# Patient Record
Sex: Male | Born: 1960 | Race: White | Hispanic: No | Marital: Married | Smoking: Former smoker
Health system: Southern US, Community
[De-identification: ages and names within clinical notes are randomized; demographics above are authoritative.]

## PROBLEM LIST (undated history)

## (undated) DIAGNOSIS — J45909 Unspecified asthma, uncomplicated: Secondary | ICD-10-CM

## (undated) DIAGNOSIS — Z8489 Family history of other specified conditions: Secondary | ICD-10-CM

## (undated) DIAGNOSIS — J449 Chronic obstructive pulmonary disease, unspecified: Secondary | ICD-10-CM

## (undated) DIAGNOSIS — J189 Pneumonia, unspecified organism: Secondary | ICD-10-CM

## (undated) DIAGNOSIS — G4733 Obstructive sleep apnea (adult) (pediatric): Secondary | ICD-10-CM

## (undated) DIAGNOSIS — R410 Disorientation, unspecified: Secondary | ICD-10-CM

## (undated) DIAGNOSIS — W19XXXA Unspecified fall, initial encounter: Secondary | ICD-10-CM

## (undated) DIAGNOSIS — G8929 Other chronic pain: Secondary | ICD-10-CM

## (undated) DIAGNOSIS — I509 Heart failure, unspecified: Secondary | ICD-10-CM

## (undated) DIAGNOSIS — K259 Gastric ulcer, unspecified as acute or chronic, without hemorrhage or perforation: Secondary | ICD-10-CM

## (undated) DIAGNOSIS — R112 Nausea with vomiting, unspecified: Secondary | ICD-10-CM

## (undated) DIAGNOSIS — E119 Type 2 diabetes mellitus without complications: Secondary | ICD-10-CM

## (undated) DIAGNOSIS — K219 Gastro-esophageal reflux disease without esophagitis: Secondary | ICD-10-CM

## (undated) DIAGNOSIS — J42 Unspecified chronic bronchitis: Secondary | ICD-10-CM

## (undated) DIAGNOSIS — K859 Acute pancreatitis without necrosis or infection, unspecified: Secondary | ICD-10-CM

## (undated) DIAGNOSIS — N200 Calculus of kidney: Secondary | ICD-10-CM

## (undated) DIAGNOSIS — I10 Essential (primary) hypertension: Secondary | ICD-10-CM

## (undated) DIAGNOSIS — F32A Depression, unspecified: Secondary | ICD-10-CM

## (undated) DIAGNOSIS — A770 Spotted fever due to Rickettsia rickettsii: Secondary | ICD-10-CM

## (undated) DIAGNOSIS — R51 Headache: Secondary | ICD-10-CM

## (undated) DIAGNOSIS — M797 Fibromyalgia: Secondary | ICD-10-CM

## (undated) DIAGNOSIS — J302 Other seasonal allergic rhinitis: Secondary | ICD-10-CM

## (undated) DIAGNOSIS — M1990 Unspecified osteoarthritis, unspecified site: Secondary | ICD-10-CM

## (undated) DIAGNOSIS — E785 Hyperlipidemia, unspecified: Secondary | ICD-10-CM

## (undated) DIAGNOSIS — I639 Cerebral infarction, unspecified: Secondary | ICD-10-CM

## (undated) DIAGNOSIS — K7460 Unspecified cirrhosis of liver: Secondary | ICD-10-CM

## (undated) DIAGNOSIS — F419 Anxiety disorder, unspecified: Secondary | ICD-10-CM

## (undated) DIAGNOSIS — R350 Frequency of micturition: Secondary | ICD-10-CM

## (undated) DIAGNOSIS — F329 Major depressive disorder, single episode, unspecified: Secondary | ICD-10-CM

## (undated) DIAGNOSIS — Y92009 Unspecified place in unspecified non-institutional (private) residence as the place of occurrence of the external cause: Secondary | ICD-10-CM

## (undated) DIAGNOSIS — F0390 Unspecified dementia without behavioral disturbance: Secondary | ICD-10-CM

## (undated) DIAGNOSIS — IMO0001 Reserved for inherently not codable concepts without codable children: Secondary | ICD-10-CM

## (undated) DIAGNOSIS — M549 Dorsalgia, unspecified: Secondary | ICD-10-CM

## (undated) DIAGNOSIS — I219 Acute myocardial infarction, unspecified: Secondary | ICD-10-CM

## (undated) HISTORY — PX: OTHER SURGICAL HISTORY: SHX169

## (undated) HISTORY — DX: Unspecified asthma, uncomplicated: J45.909

## (undated) HISTORY — PX: TONSILLECTOMY: SUR1361

## (undated) HISTORY — PX: CYSTOSCOPY W/ STONE MANIPULATION: SHX1427

## (undated) HISTORY — DX: Unspecified dementia, unspecified severity, without behavioral disturbance, psychotic disturbance, mood disturbance, and anxiety: F03.90

## (undated) HISTORY — PX: CHOLECYSTECTOMY: SHX55

## (undated) HISTORY — DX: Spotted fever due to Rickettsia rickettsii: A77.0

## (undated) HISTORY — PX: CARDIAC CATHETERIZATION: SHX172

## (undated) HISTORY — DX: Other seasonal allergic rhinitis: J30.2

## (undated) HISTORY — DX: Chronic obstructive pulmonary disease, unspecified: J44.9

## (undated) HISTORY — DX: Anxiety disorder, unspecified: F41.9

## (undated) HISTORY — DX: Hyperlipidemia, unspecified: E78.5

## (undated) HISTORY — PX: UPPER GASTROINTESTINAL ENDOSCOPY: SHX188

## (undated) HISTORY — PX: COLONOSCOPY: SHX174

---

## 2008-06-15 ENCOUNTER — Ambulatory Visit: Payer: Self-pay | Admitting: Diagnostic Radiology

## 2008-06-15 ENCOUNTER — Emergency Department (HOSPITAL_BASED_OUTPATIENT_CLINIC_OR_DEPARTMENT_OTHER): Admission: EM | Admit: 2008-06-15 | Discharge: 2008-06-15 | Payer: Self-pay | Admitting: Emergency Medicine

## 2008-08-15 ENCOUNTER — Emergency Department (HOSPITAL_BASED_OUTPATIENT_CLINIC_OR_DEPARTMENT_OTHER): Admission: EM | Admit: 2008-08-15 | Discharge: 2008-08-16 | Payer: Self-pay | Admitting: Emergency Medicine

## 2008-08-15 ENCOUNTER — Ambulatory Visit: Payer: Self-pay | Admitting: Diagnostic Radiology

## 2008-11-05 ENCOUNTER — Ambulatory Visit: Payer: Self-pay | Admitting: Diagnostic Radiology

## 2008-11-05 ENCOUNTER — Emergency Department (HOSPITAL_BASED_OUTPATIENT_CLINIC_OR_DEPARTMENT_OTHER): Admission: EM | Admit: 2008-11-05 | Discharge: 2008-11-06 | Payer: Self-pay | Admitting: Emergency Medicine

## 2010-05-14 ENCOUNTER — Emergency Department (HOSPITAL_BASED_OUTPATIENT_CLINIC_OR_DEPARTMENT_OTHER)
Admission: EM | Admit: 2010-05-14 | Discharge: 2010-05-14 | Payer: Self-pay | Source: Home / Self Care | Admitting: Emergency Medicine

## 2010-06-08 ENCOUNTER — Emergency Department (HOSPITAL_BASED_OUTPATIENT_CLINIC_OR_DEPARTMENT_OTHER)
Admission: EM | Admit: 2010-06-08 | Discharge: 2010-06-08 | Payer: Self-pay | Source: Home / Self Care | Admitting: Emergency Medicine

## 2010-06-09 LAB — DIFFERENTIAL
Basophils Absolute: 0 10*3/uL (ref 0.0–0.1)
Basophils Relative: 1 % (ref 0–1)
Eosinophils Absolute: 0.2 10*3/uL (ref 0.0–0.7)
Eosinophils Relative: 2 % (ref 0–5)
Lymphocytes Relative: 32 % (ref 12–46)
Lymphs Abs: 2.4 10*3/uL (ref 0.7–4.0)
Monocytes Absolute: 0.6 10*3/uL (ref 0.1–1.0)
Monocytes Relative: 8 % (ref 3–12)
Neutro Abs: 4.3 10*3/uL (ref 1.7–7.7)
Neutrophils Relative %: 57 % (ref 43–77)

## 2010-06-09 LAB — URINALYSIS, ROUTINE W REFLEX MICROSCOPIC
Hgb urine dipstick: NEGATIVE
Ketones, ur: 15 mg/dL — AB
Nitrite: NEGATIVE
Protein, ur: NEGATIVE mg/dL
Specific Gravity, Urine: 1.024 (ref 1.005–1.030)
Urine Glucose, Fasting: NEGATIVE mg/dL
Urobilinogen, UA: 1 mg/dL (ref 0.0–1.0)
pH: 6 (ref 5.0–8.0)

## 2010-06-09 LAB — COMPREHENSIVE METABOLIC PANEL
ALT: 42 U/L (ref 0–53)
AST: 38 U/L — ABNORMAL HIGH (ref 0–37)
Albumin: 4.4 g/dL (ref 3.5–5.2)
Alkaline Phosphatase: 67 U/L (ref 39–117)
BUN: 12 mg/dL (ref 6–23)
CO2: 22 mEq/L (ref 19–32)
Calcium: 9.1 mg/dL (ref 8.4–10.5)
Chloride: 106 mEq/L (ref 96–112)
Creatinine, Ser: 1 mg/dL (ref 0.4–1.5)
GFR calc Af Amer: >60 mL/min (ref >60)
GFR calc non Af Amer: >60 mL/min (ref >60)
Glucose, Bld: 112 mg/dL — ABNORMAL HIGH (ref 70–99)
Potassium: 4.1 mEq/L (ref 3.5–5.1)
Sodium: 144 mEq/L (ref 135–145)
Total Bilirubin: 0.6 mg/dL (ref 0.3–1.2)
Total Protein: 7.4 g/dL (ref 6.0–8.3)

## 2010-06-09 LAB — CBC
HCT: 41.6 % (ref 39.0–52.0)
Hemoglobin: 14.4 g/dL (ref 13.0–17.0)
MCH: 28.9 pg (ref 26.0–34.0)
MCHC: 34.6 g/dL (ref 30.0–36.0)
MCV: 83.4 fL (ref 78.0–100.0)
Platelets: 230 10*3/uL (ref 150–400)
RBC: 4.99 MIL/uL (ref 4.22–5.81)
RDW: 14.1 % (ref 11.5–15.5)
WBC: 7.5 10*3/uL (ref 4.0–10.5)

## 2010-06-09 LAB — LIPASE, BLOOD: Lipase: 67 U/L (ref 23–300)

## 2010-06-13 ENCOUNTER — Encounter: Payer: Self-pay | Admitting: Specialist

## 2010-07-29 ENCOUNTER — Other Ambulatory Visit: Payer: Self-pay | Admitting: Specialist

## 2010-07-29 DIAGNOSIS — R1011 Right upper quadrant pain: Secondary | ICD-10-CM

## 2010-08-02 LAB — DIFFERENTIAL
Basophils Absolute: 0.1 K/uL (ref 0.0–0.1)
Basophils Relative: 1 % (ref 0–1)
Eosinophils Absolute: 0.4 K/uL (ref 0.0–0.7)
Eosinophils Relative: 6 % — ABNORMAL HIGH (ref 0–5)
Lymphocytes Relative: 27 % (ref 12–46)
Lymphs Abs: 1.6 K/uL (ref 0.7–4.0)
Monocytes Absolute: 0.5 K/uL (ref 0.1–1.0)
Monocytes Relative: 9 % (ref 3–12)
Neutro Abs: 3.3 K/uL (ref 1.7–7.7)
Neutrophils Relative %: 57 % (ref 43–77)

## 2010-08-02 LAB — BASIC METABOLIC PANEL
BUN: 12 mg/dL (ref 6–23)
CO2: 24 mEq/L (ref 19–32)
Calcium: 9.6 mg/dL (ref 8.4–10.5)
Chloride: 105 mEq/L (ref 96–112)
Creatinine, Ser: 0.7 mg/dL (ref 0.4–1.5)
GFR calc Af Amer: >60 mL/min (ref >60)
GFR calc non Af Amer: >60 mL/min (ref >60)
Glucose, Bld: 130 mg/dL — ABNORMAL HIGH (ref 70–99)
Potassium: 4.4 mEq/L (ref 3.5–5.1)
Sodium: 140 mEq/L (ref 135–145)

## 2010-08-02 LAB — CBC
HCT: 38.6 % — ABNORMAL LOW (ref 39.0–52.0)
Hemoglobin: 13.2 g/dL (ref 13.0–17.0)
MCH: 28.7 pg (ref 26.0–34.0)
MCHC: 34.2 g/dL (ref 30.0–36.0)
MCV: 83.9 fL (ref 78.0–100.0)
Platelets: 168 10*3/uL (ref 150–400)
RBC: 4.6 MIL/uL (ref 4.22–5.81)
RDW: 14 % (ref 11.5–15.5)
WBC: 5.9 10*3/uL (ref 4.0–10.5)

## 2010-08-02 LAB — D-DIMER, QUANTITATIVE: D-Dimer, Quant: <0.22 ug/mL-FEU (ref 0.00–0.48)

## 2010-08-02 LAB — POCT B-TYPE NATRIURETIC PEPTIDE (BNP): B Natriuretic Peptide, POC: <5 pg/mL (ref 0–100)

## 2010-08-03 ENCOUNTER — Ambulatory Visit
Admission: RE | Admit: 2010-08-03 | Discharge: 2010-08-03 | Disposition: A | Discharge status: Home / Self Care | Payer: BC Managed Care – HMO | Source: Ambulatory Visit | Attending: Specialist | Admitting: Specialist

## 2010-08-03 DIAGNOSIS — R1011 Right upper quadrant pain: Secondary | ICD-10-CM

## 2010-09-02 LAB — APTT: aPTT: 24 seconds (ref 24–37)

## 2010-09-02 LAB — POCT CARDIAC MARKERS
Myoglobin, poc: 37 ng/mL (ref 12–200)
Troponin i, poc: <0.05 ng/mL (ref 0.00–0.09)

## 2010-09-02 LAB — D-DIMER, QUANTITATIVE: D-Dimer, Quant: <0.22 ug/mL-FEU (ref 0.00–0.48)

## 2010-09-02 LAB — COMPREHENSIVE METABOLIC PANEL
ALT: 38 U/L (ref 0–53)
AST: 37 U/L (ref 0–37)
Albumin: 4.3 g/dL (ref 3.5–5.2)
Alkaline Phosphatase: 55 U/L (ref 39–117)
BUN: 13 mg/dL (ref 6–23)
CO2: 24 mEq/L (ref 19–32)
Calcium: 9.1 mg/dL (ref 8.4–10.5)
Chloride: 102 mEq/L (ref 96–112)
Creatinine, Ser: 0.7 mg/dL (ref 0.4–1.5)
GFR calc Af Amer: >60 mL/min (ref >60)
GFR calc non Af Amer: >60 mL/min (ref >60)
Glucose, Bld: 221 mg/dL — ABNORMAL HIGH (ref 70–99)
Potassium: 3.6 mEq/L (ref 3.5–5.1)
Sodium: 137 mEq/L (ref 135–145)
Total Bilirubin: 0.6 mg/dL (ref 0.3–1.2)
Total Protein: 7.2 g/dL (ref 6.0–8.3)

## 2010-09-02 LAB — DIFFERENTIAL
Basophils Absolute: 0 10*3/uL (ref 0.0–0.1)
Basophils Relative: 1 % (ref 0–1)
Eosinophils Absolute: 0.1 10*3/uL (ref 0.0–0.7)
Eosinophils Relative: 1 % (ref 0–5)
Lymphocytes Relative: 22 % (ref 12–46)
Lymphs Abs: 1.4 10*3/uL (ref 0.7–4.0)
Monocytes Absolute: 0.5 10*3/uL (ref 0.1–1.0)
Monocytes Relative: 7 % (ref 3–12)
Neutro Abs: 4.4 10*3/uL (ref 1.7–7.7)
Neutrophils Relative %: 69 % (ref 43–77)

## 2010-09-02 LAB — POCT B-TYPE NATRIURETIC PEPTIDE (BNP): B Natriuretic Peptide, POC: 5.8 pg/mL (ref 0–100)

## 2010-09-02 LAB — HEPATIC FUNCTION PANEL
Alkaline Phosphatase: 53 U/L (ref 39–117)
Bilirubin, Direct: 0 mg/dL (ref 0.0–0.3)
Indirect Bilirubin: 0.6 mg/dL (ref 0.3–0.9)
Total Protein: 7.1 g/dL (ref 6.0–8.3)

## 2010-09-02 LAB — PROTIME-INR
INR: 1 (ref 0.00–1.49)
Prothrombin Time: 13.2 seconds (ref 11.6–15.2)

## 2010-09-02 LAB — CULTURE, BLOOD (ROUTINE X 2)
Culture: NO GROWTH
Culture: NO GROWTH

## 2010-09-02 LAB — CBC
HCT: 41.4 % (ref 39.0–52.0)
Hemoglobin: 13.9 g/dL (ref 13.0–17.0)
MCHC: 33.7 g/dL (ref 30.0–36.0)
MCV: 87.9 fL (ref 78.0–100.0)
Platelets: 222 10*3/uL (ref 150–400)
RBC: 4.71 MIL/uL (ref 4.22–5.81)
RDW: 12.9 % (ref 11.5–15.5)
WBC: 6.4 10*3/uL (ref 4.0–10.5)

## 2010-09-02 LAB — LIPASE, BLOOD: Lipase: 54 U/L (ref 23–300)

## 2010-09-06 LAB — URINALYSIS, ROUTINE W REFLEX MICROSCOPIC
Hgb urine dipstick: NEGATIVE
Nitrite: NEGATIVE
Protein, ur: NEGATIVE mg/dL
Specific Gravity, Urine: 1.027 (ref 1.005–1.030)
Urobilinogen, UA: 0.2 mg/dL (ref 0.0–1.0)

## 2010-09-06 LAB — COMPREHENSIVE METABOLIC PANEL
ALT: 39 U/L (ref 0–53)
AST: 48 U/L — ABNORMAL HIGH (ref 0–37)
Albumin: 4.3 g/dL (ref 3.5–5.2)
Alkaline Phosphatase: 56 U/L (ref 39–117)
CO2: 26 mEq/L (ref 19–32)
Chloride: 105 mEq/L (ref 96–112)
GFR calc Af Amer: >60 mL/min (ref >60)
GFR calc non Af Amer: >60 mL/min (ref >60)
Potassium: 3.9 mEq/L (ref 3.5–5.1)
Sodium: 139 mEq/L (ref 135–145)
Total Bilirubin: 0.4 mg/dL (ref 0.3–1.2)

## 2010-09-06 LAB — CBC
MCV: 88.4 fL (ref 78.0–100.0)
Platelets: 232 10*3/uL (ref 150–400)
RBC: 4.77 MIL/uL (ref 4.22–5.81)
WBC: 6.2 10*3/uL (ref 4.0–10.5)

## 2010-09-06 LAB — DIFFERENTIAL
Basophils Absolute: 0 10*3/uL (ref 0.0–0.1)
Eosinophils Absolute: 0.1 10*3/uL (ref 0.0–0.7)
Eosinophils Relative: 2 % (ref 0–5)
Lymphocytes Relative: 41 % (ref 12–46)
Monocytes Absolute: 0.4 10*3/uL (ref 0.1–1.0)

## 2010-09-06 LAB — LIPASE, BLOOD: Lipase: 85 U/L (ref 23–300)

## 2010-10-17 ENCOUNTER — Emergency Department (HOSPITAL_BASED_OUTPATIENT_CLINIC_OR_DEPARTMENT_OTHER)
Admission: EM | Admit: 2010-10-17 | Discharge: 2010-10-17 | Disposition: A | Discharge status: Home / Self Care | Payer: Self-pay | Attending: Emergency Medicine | Admitting: Emergency Medicine

## 2010-10-17 ENCOUNTER — Emergency Department (INDEPENDENT_AMBULATORY_CARE_PROVIDER_SITE_OTHER): Payer: Self-pay

## 2010-10-17 DIAGNOSIS — Y92009 Unspecified place in unspecified non-institutional (private) residence as the place of occurrence of the external cause: Secondary | ICD-10-CM | POA: Insufficient documentation

## 2010-10-17 DIAGNOSIS — S82899A Other fracture of unspecified lower leg, initial encounter for closed fracture: Secondary | ICD-10-CM

## 2010-10-17 DIAGNOSIS — W098XXA Fall on or from other playground equipment, initial encounter: Secondary | ICD-10-CM

## 2010-10-17 DIAGNOSIS — W07XXXA Fall from chair, initial encounter: Secondary | ICD-10-CM | POA: Insufficient documentation

## 2011-04-21 ENCOUNTER — Emergency Department (HOSPITAL_BASED_OUTPATIENT_CLINIC_OR_DEPARTMENT_OTHER)
Admission: EM | Admit: 2011-04-21 | Discharge: 2011-04-22 | Disposition: A | Discharge status: Home / Self Care | Payer: Self-pay | Attending: Emergency Medicine | Admitting: Emergency Medicine

## 2011-04-21 DIAGNOSIS — I509 Heart failure, unspecified: Secondary | ICD-10-CM | POA: Insufficient documentation

## 2011-04-21 DIAGNOSIS — Z79899 Other long term (current) drug therapy: Secondary | ICD-10-CM | POA: Insufficient documentation

## 2011-04-21 DIAGNOSIS — R1031 Right lower quadrant pain: Secondary | ICD-10-CM | POA: Insufficient documentation

## 2011-04-21 DIAGNOSIS — M549 Dorsalgia, unspecified: Secondary | ICD-10-CM | POA: Insufficient documentation

## 2011-04-21 DIAGNOSIS — G4730 Sleep apnea, unspecified: Secondary | ICD-10-CM | POA: Insufficient documentation

## 2011-04-21 DIAGNOSIS — I10 Essential (primary) hypertension: Secondary | ICD-10-CM | POA: Insufficient documentation

## 2011-04-21 DIAGNOSIS — E1169 Type 2 diabetes mellitus with other specified complication: Secondary | ICD-10-CM | POA: Insufficient documentation

## 2011-04-21 DIAGNOSIS — R739 Hyperglycemia, unspecified: Secondary | ICD-10-CM

## 2011-04-21 HISTORY — DX: Acute pancreatitis without necrosis or infection, unspecified: K85.90

## 2011-04-21 HISTORY — DX: Heart failure, unspecified: I50.9

## 2011-04-21 HISTORY — DX: Essential (primary) hypertension: I10

## 2011-04-21 HISTORY — DX: Pneumonia, unspecified organism: J18.9

## 2011-04-21 HISTORY — DX: Calculus of kidney: N20.0

## 2011-04-21 LAB — URINALYSIS, ROUTINE W REFLEX MICROSCOPIC
Bilirubin Urine: NEGATIVE
Glucose, UA: >1000 mg/dL — AB
Hgb urine dipstick: NEGATIVE
Ketones, ur: 15 mg/dL — AB
Leukocytes, UA: NEGATIVE
Nitrite: NEGATIVE
Protein, ur: NEGATIVE mg/dL
Specific Gravity, Urine: 1.018 (ref 1.005–1.030)
Urobilinogen, UA: 0.2 mg/dL (ref 0.0–1.0)
pH: 6 (ref 5.0–8.0)

## 2011-04-21 LAB — URINE MICROSCOPIC-ADD ON

## 2011-04-21 NOTE — ED Notes (Signed)
C/o abd/back pain x 2 months-worse again x 2 days-was seen by PCP yesterday-was given med-states may have r/t urologist-no dx

## 2011-04-22 ENCOUNTER — Emergency Department (INDEPENDENT_AMBULATORY_CARE_PROVIDER_SITE_OTHER): Payer: Self-pay

## 2011-04-22 DIAGNOSIS — R1031 Right lower quadrant pain: Secondary | ICD-10-CM

## 2011-04-22 DIAGNOSIS — R109 Unspecified abdominal pain: Secondary | ICD-10-CM

## 2011-04-22 DIAGNOSIS — R112 Nausea with vomiting, unspecified: Secondary | ICD-10-CM

## 2011-04-22 LAB — COMPREHENSIVE METABOLIC PANEL
Alkaline Phosphatase: 65 U/L (ref 39–117)
BUN: 19 mg/dL (ref 6–23)
GFR calc Af Amer: >90 mL/min (ref >90)
Glucose, Bld: 383 mg/dL — ABNORMAL HIGH (ref 70–99)
Potassium: 4.3 mEq/L (ref 3.5–5.1)
Total Bilirubin: 0.3 mg/dL (ref 0.3–1.2)
Total Protein: 7 g/dL (ref 6.0–8.3)

## 2011-04-22 LAB — LIPASE, BLOOD: Lipase: 50 U/L (ref 11–59)

## 2011-04-22 LAB — CBC
HCT: 41.5 % (ref 39.0–52.0)
Hemoglobin: 13.8 g/dL (ref 13.0–17.0)
MCH: 27.8 pg (ref 26.0–34.0)
MCV: 83.7 fL (ref 78.0–100.0)
RBC: 4.96 MIL/uL (ref 4.22–5.81)

## 2011-04-22 LAB — DIFFERENTIAL
Eosinophils Absolute: 0 10*3/uL (ref 0.0–0.7)
Eosinophils Relative: 0 % (ref 0–5)
Lymphs Abs: 0.8 10*3/uL (ref 0.7–4.0)
Monocytes Relative: 4 % (ref 3–12)
Neutrophils Relative %: 81 % — ABNORMAL HIGH (ref 43–77)

## 2011-04-22 MED ORDER — ONDANSETRON HCL 4 MG/2ML IJ SOLN
4.0000 mg | Freq: Once | INTRAMUSCULAR | Status: AC
  Administered 2011-04-22: 4 mg via INTRAVENOUS
  Filled 2011-04-22: qty 2

## 2011-04-22 MED ORDER — HYDROMORPHONE HCL PF 1 MG/ML IJ SOLN
1.0000 mg | Freq: Once | INTRAMUSCULAR | Status: AC
  Administered 2011-04-22: 1 mg via INTRAVENOUS
  Filled 2011-04-22: qty 1

## 2011-04-22 MED ORDER — HYDROMORPHONE HCL PF 1 MG/ML IJ SOLN
INTRAMUSCULAR | Status: AC
  Administered 2011-04-22: 1 mg via INTRAVENOUS
  Filled 2011-04-22: qty 1

## 2011-04-22 MED ORDER — HYDROMORPHONE HCL PF 1 MG/ML IJ SOLN
1.0000 mg | Freq: Once | INTRAMUSCULAR | Status: AC
  Administered 2011-04-22: 1 mg via INTRAVENOUS

## 2011-04-22 MED ORDER — SODIUM CHLORIDE 0.9 % IV BOLUS (SEPSIS)
1000.0000 mL | Freq: Once | INTRAVENOUS | Status: AC
  Administered 2011-04-22: 1000 mL via INTRAVENOUS

## 2011-04-22 MED ORDER — IOHEXOL 300 MG/ML  SOLN
100.0000 mL | Freq: Once | INTRAMUSCULAR | Status: AC | PRN
  Administered 2011-04-22: 100 mL via INTRAVENOUS

## 2011-04-22 MED ORDER — HYDROCODONE-ACETAMINOPHEN 7.5-500 MG PO TABS: 1.0000 | ORAL_TABLET | Freq: Four times a day (QID) | ORAL | Status: AC | PRN

## 2011-04-22 MED ORDER — CYCLOBENZAPRINE HCL 10 MG PO TABS: 10.0000 mg | ORAL_TABLET | Freq: Two times a day (BID) | ORAL | Status: AC | PRN

## 2011-04-22 NOTE — ED Provider Notes (Signed)
History     CSN: [id number] Arrival date & time: 04/21/2011 11:17 PM   First MD Initiated Contact with Patient 04/21/11 2336      Chief Complaint  Patient presents with  . Abdominal Pain  . Back Pain    HPI  50yo L. history of non-insulin-dependent diabetes, hypertension, pancreatitis, CHF presents with abdominal pain, back pain. The patient is complaining of intermittent right-sided abdominal pain, flank, back pain for 2 months. He is having the pain almost daily. He states that the pain is worse for the past 2 days. He currently rates it as 10 out of 10. The pain is worse with movement. It is better when he lying on his right side. He describes nausea denies vomiting. He's been seen by his primary care doctor previously for the pain. He is prescribed Norco and there is no relief of the pain. He denies constipation, diarrhea. History of cholecystectomy. Denies fever. He had 2 episodes of chills yesterday. Denies hematuria/dysuria/freq/urgency. Denies history of recent trauma/falls. Denies h/o malignancy, DM, immunocompromise  injection drug use, immunosuppression, indwelling urinary catheter, prolonged steroid use, skin or urinary tract infection. No numbness/tingling/weakness of extremities. Denies saddle anesthesia, no urinary incontinence or retention.      ED Notes, ED Provider Notes from 04/21/11 0000 to 04/21/11 22:58:07       Andrey Spearman, RN 04/21/2011 22:52      C/o abd/back pain x 2 months-worse again x 2 days-was seen by PCP yesterday-was given med-states may have r/t urologist-no dx    Past Medical History  Diagnosis Date  . Diabetes mellitus   . Hypertension   . Pancreatitis   . Kidney stone   . Sleep apnea   . CHF (congestive heart failure)   . Pneumonia     Past Surgical History  Procedure Date  . Cholecystectomy   . Multiple cysts removal-hip,wrist     No family history on file.  History  Substance Use Topics  . Smoking status: Never Smoker   .  Smokeless tobacco: Not on file  . Alcohol Use: No      Review of Systems  All other systems reviewed and are negative.   except as noted HPI   Allergies  Codeine  Home Medications   Current Outpatient Rx  Name Route Sig Dispense Refill  . ALBUTEROL SULFATE HFA 108 (90 BASE) MCG/ACT IN AERS Inhalation Inhale 2 puffs into the lungs every 6 (six) hours as needed. For shortness of breath     . ASPIRIN 81 MG PO TABS Oral Take 81 mg by mouth daily.      Marland Kitchen CITALOPRAM HYDROBROMIDE 40 MG PO TABS Oral Take 40 mg by mouth daily.      . CYANOCOBALAMIN 1000 MCG/ML IJ SOLN Intramuscular Inject 1,000 mcg into the muscle every 30 (thirty) days.      Marland Kitchen DIPHENHYDRAMINE HCL 25 MG PO TABS Oral Take 25 mg by mouth daily.      . FUROSEMIDE 40 MG PO TABS Oral Take 40 mg by mouth 2 (two) times daily.      Marland Kitchen GLIMEPIRIDE 4 MG PO TABS Oral Take 4 mg by mouth 2 (two) times daily.      Marland Kitchen GLUCOSAMINE 500 MG PO TABS Oral Take 500 mg by mouth 2 (two) times daily.      Marland Kitchen HYDROCODONE-ACETAMINOPHEN 5-325 MG PO TABS Oral Take 1 tablet by mouth every 4 (four) hours as needed. For pain     . INSULIN GLARGINE 100 UNIT/ML  [place] SOLN Subcutaneous Inject 80 Units into the skin 2 (two) times daily.      Marland Kitchen LIRAGLUTIDE 18 MG/3ML [place] SOLN Subcutaneous Inject into the skin daily.      Marland Kitchen LISINOPRIL 40 MG PO TABS Oral Take 40 mg by mouth daily.      Marland Kitchen LORATADINE 10 MG PO TABS Oral Take 10 mg by mouth daily.      Marland Kitchen LORAZEPAM 1 MG PO TABS Oral Take 1 mg by mouth 2 (two) times daily.      Marland Kitchen TIOTROPIUM BROMIDE MONOHYDRATE 18 MCG IN CAPS Inhalation Place 18 mcg into inhaler and inhale daily.      . TRAMADOL HCL 50 MG PO TABS Oral Take 50 mg by mouth every 6 (six) hours as needed. For pain. Maximum dose= 8 tablets per day     . TRIAMCINOLONE ACETONIDE 0.1 % EX CREA Topical Apply 1 application topically once.      Marland Kitchen VITAMIN D (ERGOCALCIFEROL) 50000 UNITS PO CAPS Oral Take 50,000 Units by mouth every 7 (seven) days.      . CYCLOBENZAPRINE  HCL 10 MG PO TABS Oral Take 1 tablet (10 mg total) by mouth 2 (two) times daily as needed for muscle spasms. 20 tablet 0  . HYDROCODONE-ACETAMINOPHEN 7.5-500 MG PO TABS Oral Take 1 tablet by mouth every 6 (six) hours as needed for pain. 15 tablet 0    BP 144/70  Pulse 100  Temp(Src) 98.4 F (36.9 C) (Oral)  Resp 18  Ht 5' 10"  (1.778 m)  Wt 306 lb (138.801 kg)  BMI 43.91 kg/m2  SpO2 98%  Physical Exam  Nursing note and vitals reviewed. Constitutional: He is oriented to person, place, and time. He appears well-developed and well-nourished. No distress.  HENT:  Head: Atraumatic.  Mouth/Throat: Oropharynx is clear and moist.  Eyes: Conjunctivae are normal. Pupils are equal, round, and reactive to light.  Neck: Neck supple.  Cardiovascular: Normal rate, regular rhythm, normal heart sounds and intact distal pulses.  Exam reveals no gallop and no friction rub.   No murmur heard. Pulmonary/Chest: Effort normal. No respiratory distress. He has no wheezes. He has no rales.  Abdominal: Soft. Bowel sounds are normal. There is tenderness. There is no rebound and no guarding.       +epigastric/RUQ/RLQ R thoracic/lumbar paraspinal ttp  Musculoskeletal: Normal range of motion. He exhibits edema. He exhibits no tenderness.       Trace pitting edema b/l LE  Neurological: He is alert and oriented to person, place, and time. No cranial nerve deficit. He exhibits normal muscle tone.       No saddle anesthesia  Skin: Skin is warm and dry.  Psychiatric: He has a normal mood and affect.    ED Course  Procedures (including critical care time)  Labs Reviewed  URINALYSIS, ROUTINE W REFLEX MICROSCOPIC - Abnormal; Notable for the following:    Color, Urine STRAW (*)    Glucose, UA >1000 (*)    Ketones, ur 15 (*)    All other components within normal limits  DIFFERENTIAL - Abnormal; Notable for the following:    Neutrophils Relative 81 (*)    All other components within normal limits  COMPREHENSIVE  METABOLIC PANEL - Abnormal; Notable for the following:    Glucose, Bld 383 (*)    All other components within normal limits  GLUCOSE, CAPILLARY - Abnormal; Notable for the following:    Glucose-Capillary 285 (*)    All other components within normal limits  URINE MICROSCOPIC-ADD ON  CBC  LIPASE, BLOOD   Ct Abdomen Pelvis W Contrast  04/22/2011  *RADIOLOGY REPORT*  Clinical Data: Right lower quadrant abdominal pain and right flank pain; nausea and vomiting.  CT ABDOMEN AND PELVIS WITH CONTRAST  Technique:  Multidetector CT imaging of the abdomen and pelvis was performed following the standard protocol during bolus administration of intravenous contrast.  Contrast: 124m OMNIPAQUE IOHEXOL 300 MG/ML IV SOLN  Comparison: CT of the abdomen and pelvis performed 06/08/2010, and abdominal ultrasound performed 08/03/2010  Findings: Minimal bibasilar atelectasis is noted.  The liver and spleen are unremarkable in appearance. The patient is status post cholecystectomy, with clips noted at the gallbladder fossa.  The pancreas is unremarkable in appearance.  There is no evidence of pancreatitis.  The adrenal glands are within normal limits.  Nonspecific perinephric stranding is noted bilaterally, relatively stable from the prior CT.  The kidneys are otherwise grossly unremarkable in appearance.  There is no evidence of hydronephrosis.  No renal stones are seen.  No free fluid is identified.  The small bowel is unremarkable in appearance.  The stomach is filled with contrast and is within normal limits.  No acute vascular abnormalities are seen.  Minimal calcification is noted along the abdominal aorta and its branches.  The appendix is normal in caliber, without evidence for appendicitis.  The colon is unremarkable in appearance.  The bladder is mildly distended and unremarkable in appearance. The prostate is normal in size, with minimal calcification.  No inguinal lymphadenopathy is seen.  No acute osseous  abnormalities are identified.  IMPRESSION: No acute abnormalities identified within the abdomen or pelvis.  Original Report Authenticated By: JSanta Lighter M.D.     1. Abdominal pain   2. Hyperglycemia   3. Back pain      MDM  Abdominal pain/flank pain unclear etiology. Labs unremarkable. VSS. Pt to f/u with PMD.    Glucose improved with IVF alone. Pain improved with dilaudid but still present. Ambulatory. Tolerating PO. D/C home with lortab, pmd f/u. Discussed CT A/P with patient and family       LBlair Heys MD 04/22/11 [phone]

## 2011-04-22 NOTE — ED Notes (Signed)
Pt ambulated with assistance with a steady gait

## 2011-04-22 NOTE — ED Notes (Signed)
Pt's O2 sat at 92%. O2 applied by [facility] at 2lpm.

## 2011-05-19 ENCOUNTER — Encounter (HOSPITAL_BASED_OUTPATIENT_CLINIC_OR_DEPARTMENT_OTHER): Payer: Self-pay

## 2011-05-19 ENCOUNTER — Emergency Department (HOSPITAL_BASED_OUTPATIENT_CLINIC_OR_DEPARTMENT_OTHER)
Admission: EM | Admit: 2011-05-19 | Discharge: 2011-05-20 | Disposition: A | Discharge status: Home / Self Care | Payer: BC Managed Care – PPO | Attending: Emergency Medicine | Admitting: Emergency Medicine

## 2011-05-19 DIAGNOSIS — I509 Heart failure, unspecified: Secondary | ICD-10-CM | POA: Insufficient documentation

## 2011-05-19 DIAGNOSIS — M79609 Pain in unspecified limb: Secondary | ICD-10-CM | POA: Insufficient documentation

## 2011-05-19 DIAGNOSIS — G4730 Sleep apnea, unspecified: Secondary | ICD-10-CM | POA: Insufficient documentation

## 2011-05-19 DIAGNOSIS — I10 Essential (primary) hypertension: Secondary | ICD-10-CM | POA: Insufficient documentation

## 2011-05-19 DIAGNOSIS — M79606 Pain in leg, unspecified: Secondary | ICD-10-CM

## 2011-05-19 DIAGNOSIS — E119 Type 2 diabetes mellitus without complications: Secondary | ICD-10-CM | POA: Insufficient documentation

## 2011-05-19 DIAGNOSIS — Z79899 Other long term (current) drug therapy: Secondary | ICD-10-CM | POA: Insufficient documentation

## 2011-05-19 NOTE — ED Notes (Signed)
Pt c/o L leg pain.  Pt states pain has increased over past 3 days.  Pt states pain is in hamstring of L leg.  Pt taking tramadol and hydrocodone with no relief.  Pt has FU with PCP tomorrow.  Pt states he has HX of bulging disc with associated leg pain but this is different.

## 2011-05-20 MED ORDER — HYDROMORPHONE HCL PF 1 MG/ML IJ SOLN
1.0000 mg | Freq: Once | INTRAMUSCULAR | Status: AC
  Administered 2011-05-20: 1 mg via INTRAMUSCULAR
  Filled 2011-05-20: qty 1

## 2011-05-20 NOTE — ED Provider Notes (Signed)
History     CSN: [id number]  Arrival date & time 05/19/11  1914   First MD Initiated Contact with Patient 05/20/11 0003      Chief Complaint  Patient presents with  . 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  Started having pain to left post thigh 2-3 days ago, getting worse.  Does not seem to radiate from back.   No numbness or weakness in leg  Patient is a 50 y.o. male presenting with leg pain. The history is provided by the patient.  Leg Pain  The incident occurred more than 2 days ago. The incident occurred at home. There was no injury mechanism. The pain is present in the left thigh. The quality of the pain is described as aching and burning. The pain is moderate. The pain has been constant since onset. Pertinent negatives include no numbness, no inability to bear weight, no loss of motion, no muscle weakness and no loss of sensation. The symptoms are aggravated by palpation and activity. Treatments tried: hydrocodone. The treatment provided no relief.    Past Medical History  Diagnosis Date  . Diabetes mellitus   . Hypertension   . Pancreatitis   . Kidney stone   . Sleep apnea   . CHF (congestive heart failure)   . Pneumonia     Past Surgical History  Procedure Date  . Cholecystectomy   . Multiple cysts removal-hip,wrist     No family history on file.  History  Substance Use Topics  . Smoking status: Never Smoker   . Smokeless tobacco: Not on file  . Alcohol Use: No      Review of Systems  Constitutional: Negative for fever, chills, diaphoresis and fatigue.  HENT: Negative for congestion, rhinorrhea and sneezing.   Eyes: Negative.   Respiratory: Negative for cough, chest tightness and shortness of breath.   Cardiovascular: Negative for chest pain and leg swelling.  Gastrointestinal: Negative for nausea, vomiting,  abdominal pain, diarrhea and blood in stool.  Genitourinary: Negative for frequency, hematuria, flank pain and difficulty urinating.  Musculoskeletal: Positive for back pain. Negative for arthralgias.  Skin: Negative for rash.  Neurological: Negative for dizziness, speech difficulty, weakness, numbness and headaches.    Allergies  Codeine  Home Medications   Current Outpatient Rx  Name Route Sig Dispense Refill  . ALBUTEROL SULFATE HFA 108 (90 BASE) MCG/ACT IN AERS Inhalation Inhale 2 puffs into the lungs every 6 (six) hours as needed. For shortness of breath     . ASPIRIN 81 MG PO TABS Oral Take 81 mg by mouth daily.      Marland Kitchen CALCIUM & MAGNESIUM CARBONATES 311-232 MG PO TABS Oral Take 2 tablets by mouth daily as needed. For indigestion     . CARISOPRODOL 350 MG PO TABS Oral Take 350 mg by mouth 3 (three) times daily as needed. For pain      . CITALOPRAM HYDROBROMIDE 40 MG PO TABS Oral Take 40 mg by mouth daily.      . CYANOCOBALAMIN 1000 MCG/ML IJ SOLN Intramuscular Inject 1,000 mcg into the muscle once a week.     Marland Kitchen DIPHENHYDRAMINE HCL 25 MG PO TABS Oral Take 25 mg by mouth daily as needed. For allergies     . FUROSEMIDE 40 MG PO TABS Oral Take 40 mg by mouth 2 (two) times daily.      Marland Kitchen GLIMEPIRIDE 4 MG  PO TABS Oral Take 4 mg by mouth 2 (two) times daily.      Marland Kitchen GLUCOSAMINE 500 MG PO TABS Oral Take 500 mg by mouth 2 (two) times daily.      Marland Kitchen HYDROCODONE-ACETAMINOPHEN 5-325 MG PO TABS Oral Take 1 tablet by mouth every 4 (four) hours as needed. For pain     . INSULIN GLARGINE 100 UNIT/ML [place] SOLN Subcutaneous Inject 80 Units into the skin 2 (two) times daily.      Marland Kitchen LIRAGLUTIDE 18 MG/3ML [place] SOLN Subcutaneous Inject into the skin daily.     Marland Kitchen LISINOPRIL 40 MG PO TABS Oral Take 40 mg by mouth 2 (two) times daily.     Marland Kitchen LORATADINE 10 MG PO TABS Oral Take 10 mg by mouth daily.      Marland Kitchen LORAZEPAM 1 MG PO TABS Oral Take 1 mg by mouth 2 (two) times daily.      Marland Kitchen TIOTROPIUM BROMIDE MONOHYDRATE 18  MCG IN CAPS Inhalation Place 18 mcg into inhaler and inhale daily as needed. For shortness of breath    . TRAMADOL HCL 50 MG PO TABS Oral Take 50 mg by mouth 3 (three) times daily as needed. For pain. Maximum dose= 8 tablets per day    . VITAMIN D (ERGOCALCIFEROL) 50000 UNITS PO CAPS Oral Take 50,000 Units by mouth once a week. For 12 weeks      BP 128/74  Pulse 87  Temp(Src) 98 F (36.7 C) (Oral)  Resp 18  Ht 5' 10"  (1.778 m)  Wt 303 lb (137.44 kg)  BMI 43.48 kg/m2  SpO2 98%  Physical Exam  Constitutional: He is oriented to person, place, and time. He appears well-developed and well-nourished.  HENT:  Head: Normocephalic and atraumatic.  Eyes: Pupils are equal, round, and reactive to light.  Neck: Normal range of motion. Neck supple.  Cardiovascular: Normal rate, regular rhythm and normal heart sounds.   Pulmonary/Chest: Effort normal and breath sounds normal. No respiratory distress. He has no wheezes. He has no rales. He exhibits no tenderness.  Abdominal: Soft. Bowel sounds are normal. There is no tenderness. There is no rebound and no guarding.  Musculoskeletal: Normal range of motion. He exhibits tenderness. He exhibits no edema.       No noticeable swelling to left leg as compared to right.  Mild TTP post mid left thigh.  Pulses intact.  Motor intact.  Some subjective decreased sensation to LT both feet (pt says this is chronic due to DM.  No warmth/erythema/masses palpated  Lymphadenopathy:    He has no cervical adenopathy.  Neurological: He is alert and oriented to person, place, and time.  Skin: Skin is warm and dry. No rash noted.  Psychiatric: He has a normal mood and affect.    ED Course  Procedures (including critical care time)  Results for orders placed during the hospital encounter of 05/19/11  D-DIMER, QUANTITATIVE      Component Value Range   D-Dimer, Quant 0.22  0.00 - 0.48 (ug/mL-FEU)     1. Leg pain       MDM  Pt with posterior thigh pain, worse  on palpation, pressure to area, movement.  No noticeable swelling to leg.  Given no swelling, neg d-dimer, have low suspicion of DVT.  Feel that this is likely musculoskeletal.  Has appt to f/u with PMD tomorrow.  Given shot of dilaudid here, has pain meds at home.        Malvin Johns, MD 05/20/11  0139 

## 2012-05-23 ENCOUNTER — Emergency Department (HOSPITAL_BASED_OUTPATIENT_CLINIC_OR_DEPARTMENT_OTHER)
Admission: EM | Admit: 2012-05-23 | Discharge: 2012-05-23 | Disposition: A | Discharge status: Home / Self Care | Payer: BC Managed Care – PPO | Attending: Emergency Medicine | Admitting: Emergency Medicine

## 2012-05-23 ENCOUNTER — Encounter (HOSPITAL_BASED_OUTPATIENT_CLINIC_OR_DEPARTMENT_OTHER): Payer: Self-pay | Admitting: *Deleted

## 2012-05-23 DIAGNOSIS — E119 Type 2 diabetes mellitus without complications: Secondary | ICD-10-CM | POA: Insufficient documentation

## 2012-05-23 DIAGNOSIS — Z8719 Personal history of other diseases of the digestive system: Secondary | ICD-10-CM | POA: Insufficient documentation

## 2012-05-23 DIAGNOSIS — I509 Heart failure, unspecified: Secondary | ICD-10-CM | POA: Insufficient documentation

## 2012-05-23 DIAGNOSIS — Z794 Long term (current) use of insulin: Secondary | ICD-10-CM | POA: Insufficient documentation

## 2012-05-23 DIAGNOSIS — Z9089 Acquired absence of other organs: Secondary | ICD-10-CM | POA: Insufficient documentation

## 2012-05-23 DIAGNOSIS — R112 Nausea with vomiting, unspecified: Secondary | ICD-10-CM | POA: Insufficient documentation

## 2012-05-23 DIAGNOSIS — Z8701 Personal history of pneumonia (recurrent): Secondary | ICD-10-CM | POA: Insufficient documentation

## 2012-05-23 DIAGNOSIS — Z87442 Personal history of urinary calculi: Secondary | ICD-10-CM | POA: Insufficient documentation

## 2012-05-23 DIAGNOSIS — I10 Essential (primary) hypertension: Secondary | ICD-10-CM | POA: Insufficient documentation

## 2012-05-23 DIAGNOSIS — Z7982 Long term (current) use of aspirin: Secondary | ICD-10-CM | POA: Insufficient documentation

## 2012-05-23 DIAGNOSIS — R197 Diarrhea, unspecified: Secondary | ICD-10-CM

## 2012-05-23 DIAGNOSIS — Z79899 Other long term (current) drug therapy: Secondary | ICD-10-CM | POA: Insufficient documentation

## 2012-05-23 DIAGNOSIS — R1110 Vomiting, unspecified: Secondary | ICD-10-CM

## 2012-05-23 LAB — CBC WITH DIFFERENTIAL/PLATELET
Basophils Absolute: 0 10*3/uL (ref 0.0–0.1)
Basophils Relative: 0 % (ref 0–1)
Eosinophils Absolute: 0.2 10*3/uL (ref 0.0–0.7)
Eosinophils Relative: 3 % (ref 0–5)
HCT: 47.8 % (ref 39.0–52.0)
MCHC: 34.9 g/dL (ref 30.0–36.0)
MCV: 84.9 fL (ref 78.0–100.0)
Monocytes Absolute: 0.8 10*3/uL (ref 0.1–1.0)
RDW: 13.9 % (ref 11.5–15.5)

## 2012-05-23 LAB — URINALYSIS, ROUTINE W REFLEX MICROSCOPIC
Glucose, UA: NEGATIVE mg/dL
Leukocytes, UA: NEGATIVE
Nitrite: NEGATIVE
Protein, ur: 100 mg/dL — AB
pH: 5.5 (ref 5.0–8.0)

## 2012-05-23 LAB — COMPREHENSIVE METABOLIC PANEL
AST: 30 U/L (ref 0–37)
Albumin: 5 g/dL (ref 3.5–5.2)
Calcium: 9.7 mg/dL (ref 8.4–10.5)
Creatinine, Ser: 0.9 mg/dL (ref 0.50–1.35)

## 2012-05-23 LAB — URINE MICROSCOPIC-ADD ON

## 2012-05-23 LAB — GLUCOSE, CAPILLARY
Glucose-Capillary: 212 mg/dL — ABNORMAL HIGH (ref 70–99)
Glucose-Capillary: 135 mg/dL — ABNORMAL HIGH (ref 70–99)

## 2012-05-23 MED ORDER — PERMETHRIN 5 % EX CREA
TOPICAL_CREAM | Freq: Once | CUTANEOUS | Status: DC
  Filled 2012-05-23: qty 60

## 2012-05-23 MED ORDER — PERMETHRIN 5 % EX CREA: TOPICAL_CREAM | CUTANEOUS | Status: DC

## 2012-05-23 MED ORDER — ONDANSETRON HCL 4 MG/2ML IJ SOLN
4.0000 mg | Freq: Once | INTRAMUSCULAR | Status: AC
  Administered 2012-05-23: 4 mg via INTRAVENOUS
  Filled 2012-05-23: qty 2

## 2012-05-23 MED ORDER — DIPHENOXYLATE-ATROPINE 2.5-0.025 MG PO TABS: 1.0000 | ORAL_TABLET | Freq: Four times a day (QID) | ORAL | Status: DC | PRN

## 2012-05-23 MED ORDER — DIPHENOXYLATE-ATROPINE 2.5-0.025 MG PO TABS
2.0000 | ORAL_TABLET | Freq: Once | ORAL | Status: AC
  Administered 2012-05-23: 2 via ORAL
  Filled 2012-05-23: qty 2

## 2012-05-23 MED ORDER — ONDANSETRON 4 MG PO TBDP: 4.0000 mg | ORAL_TABLET | Freq: Three times a day (TID) | ORAL | Status: DC | PRN

## 2012-05-23 MED ORDER — HYDROCODONE-ACETAMINOPHEN 5-325 MG PO TABS
2.0000 | ORAL_TABLET | Freq: Once | ORAL | Status: AC
  Administered 2012-05-23: 2 via ORAL
  Filled 2012-05-23: qty 2

## 2012-05-23 MED ORDER — CIPROFLOXACIN HCL 500 MG PO TABS: 500.0000 mg | ORAL_TABLET | Freq: Two times a day (BID) | ORAL | Status: DC

## 2012-05-23 MED ORDER — CIPROFLOXACIN HCL 500 MG PO TABS
500.0000 mg | ORAL_TABLET | Freq: Once | ORAL | Status: AC
  Administered 2012-05-23: 500 mg via ORAL
  Filled 2012-05-23: qty 1

## 2012-05-23 MED ORDER — POTASSIUM CHLORIDE CRYS ER 20 MEQ PO TBCR
40.0000 meq | EXTENDED_RELEASE_TABLET | Freq: Two times a day (BID) | ORAL | Status: DC
  Administered 2012-05-23: 40 meq via ORAL
  Filled 2012-05-23: qty 2

## 2012-05-23 NOTE — ED Notes (Signed)
Patient states that for the past three days he has been experiencing diarrhea with n/v.  Patient has had 2 episodes of diarrhea while in his room.  States that his abdomen also hurts and his lower back.  Patient takes imodium for his symptoms, took one dose about 30 min. Prior to arrival.  Alert and oriented x 3.

## 2012-05-23 NOTE — ED Notes (Signed)
Patient is resting comfortably. He is unable to urinate at this time. He is very dizzy when he stands, he states that he just feels very bad. The bed rails are up and the call light is within reach, the bed is locked and in the lowest position.

## 2012-05-23 NOTE — ED Notes (Signed)
Patient given diet sprite and crackers

## 2012-05-23 NOTE — ED Notes (Signed)
Pt states he has had N/V/D x 3 days. Tried Imodium and Pedialyte without relief.

## 2012-05-23 NOTE — ED Provider Notes (Signed)
History     CSN: [id number]  Arrival date & time 05/23/12  96   First MD Initiated Contact with Patient 05/23/12 1406      Chief Complaint  Patient presents with  . Diarrhea    (Consider location/radiation/quality/duration/timing/severity/associated sxs/prior treatment) Patient is a 52 y.o. male presenting with diarrhea and vomiting. The history is provided by the patient. No language interpreter was used.  Diarrhea The primary symptoms include nausea, vomiting and diarrhea. The illness began 3 to 5 days ago. The onset was gradual. The problem has been gradually worsening.  The illness is also significant for chills. The illness does not include back pain. Significant associated medical issues include liver disease. Risk factors: pancreatitis.  Emesis  This is a new problem. Episode onset: 3 days. The problem has been gradually worsening. There has been no fever. Associated symptoms include chills and diarrhea.  Pt reports diarrhea has continued for 3 days.  No relief with imodium.   Pt reports he has had vomiting but it has decreased.  Pt complains ot continued nausea  Past Medical History  Diagnosis Date  . Diabetes mellitus   . Hypertension   . Pancreatitis   . Kidney stone   . Sleep apnea   . CHF (congestive heart failure)   . Pneumonia     Past Surgical History  Procedure Date  . Cholecystectomy   . Multiple cysts removal-hip,wrist     History reviewed. No pertinent family history.  History  Substance Use Topics  . Smoking status: Never Smoker   . Smokeless tobacco: Not on file  . Alcohol Use: No      Review of Systems  Constitutional: Positive for chills.  Gastrointestinal: Positive for nausea, vomiting and diarrhea.  Musculoskeletal: Negative for back pain.  All other systems reviewed and are negative.    Allergies  Codeine  Home Medications   Current Outpatient Rx  Name  Route  Sig  Dispense  Refill  . ASPIRIN 81 MG PO TABS   Oral   Take 81  mg by mouth daily.           Marland Kitchen CITALOPRAM HYDROBROMIDE 40 MG PO TABS   Oral   Take 40 mg by mouth daily.           . FUROSEMIDE 40 MG PO TABS   Oral   Take 40 mg by mouth 2 (two) times daily.           Marland Kitchen GLIMEPIRIDE 4 MG PO TABS   Oral   Take 4 mg by mouth 2 (two) times daily.           Marland Kitchen LISINOPRIL 40 MG PO TABS   Oral   Take 40 mg by mouth 2 (two) times daily.          Marland Kitchen LOPERAMIDE HCL 2 MG PO CAPS   Oral   Take 2 mg by mouth 4 (four) times daily as needed.         Marland Kitchen POTASSIUM GLUCONATE 595 MG PO CAPS   Oral   Take 595 mg by mouth once.         . ALBUTEROL SULFATE HFA 108 (90 BASE) MCG/ACT IN AERS   Inhalation   Inhale 2 puffs into the lungs every 6 (six) hours as needed. For shortness of breath          . CALCIUM & MAGNESIUM CARBONATES 311-232 MG PO TABS   Oral   Take 2 tablets by mouth daily as  needed. For indigestion          . CARISOPRODOL 350 MG PO TABS   Oral   Take 350 mg by mouth 3 (three) times daily as needed. For pain           . CYANOCOBALAMIN 1000 MCG/ML IJ SOLN   Intramuscular   Inject 1,000 mcg into the muscle once a week.          Marland Kitchen DIPHENHYDRAMINE HCL 25 MG PO TABS   Oral   Take 25 mg by mouth daily as needed. For allergies          . GLUCOSAMINE 500 MG PO TABS   Oral   Take 500 mg by mouth 2 (two) times daily.           Marland Kitchen HYDROCODONE-ACETAMINOPHEN 5-325 MG PO TABS   Oral   Take 1 tablet by mouth every 4 (four) hours as needed. For pain          . INSULIN GLARGINE 100 UNIT/ML [place] SOLN   Subcutaneous   Inject 80 Units into the skin 2 (two) times daily.           Marland Kitchen LIRAGLUTIDE 18 MG/3ML [place] SOLN   Subcutaneous   Inject into the skin daily.          Marland Kitchen LORATADINE 10 MG PO TABS   Oral   Take 10 mg by mouth daily.           Marland Kitchen LORAZEPAM 1 MG PO TABS   Oral   Take 1 mg by mouth 2 (two) times daily.           Marland Kitchen PREGABALIN 75 MG PO CAPS   Oral   Take 75 mg by mouth 2 (two) times daily.         Marland Kitchen  TIOTROPIUM BROMIDE MONOHYDRATE 18 MCG IN CAPS   Inhalation   Place 18 mcg into inhaler and inhale daily as needed. For shortness of breath         . TRAMADOL HCL 50 MG PO TABS   Oral   Take 50 mg by mouth 3 (three) times daily as needed. For pain. Maximum dose= 8 tablets per day         . VITAMIN D (ERGOCALCIFEROL) 50000 UNITS PO CAPS   Oral   Take 50,000 Units by mouth once a week. For 12 weeks           BP 133/75  Pulse 104  Temp 98 F (36.7 C) (Oral)  Resp 24  Ht 5' 9"  (1.753 m)  Wt 270 lb (122.471 kg)  BMI 39.87 kg/m2  SpO2 100%  Physical Exam  Nursing note and vitals reviewed. Constitutional: He is oriented to person, place, and time. He appears well-developed and well-nourished.  HENT:  Head: Normocephalic.  Right Ear: External ear normal.  Left Ear: External ear normal.  Mouth/Throat: Oropharynx is clear and moist.  Eyes: Conjunctivae normal and EOM are normal. Pupils are equal, round, and reactive to light.  Neck: Normal range of motion. Neck supple.  Cardiovascular: Normal rate and regular rhythm.   Pulmonary/Chest: Effort normal and breath sounds normal.  Abdominal: Soft. There is no tenderness.  Neurological: He is alert and oriented to person, place, and time. He has normal reflexes.  Skin: Skin is warm.    ED Course  Procedures (including critical care time)  Labs Reviewed  GLUCOSE, CAPILLARY - Abnormal; Notable for the following:    Glucose-Capillary 212 (*)  All other components within normal limits  CBC WITH DIFFERENTIAL  COMPREHENSIVE METABOLIC PANEL  LIPASE, BLOOD  URINALYSIS, ROUTINE W REFLEX MICROSCOPIC   No results found.   1. Diarrhea   2. Vomiting    Results for orders placed during the hospital encounter of 05/23/12  GLUCOSE, CAPILLARY      Component Value Range   Glucose-Capillary 212 (*) 70 - 99 mg/dL  CBC WITH DIFFERENTIAL      Component Value Range   WBC 7.2  4.0 - 10.5 K/uL   RBC 5.63  4.22 - 5.81 MIL/uL    Hemoglobin 16.7  13.0 - 17.0 g/dL   HCT 47.8  39.0 - 52.0 %   MCV 84.9  78.0 - 100.0 fL   MCH 29.7  26.0 - 34.0 pg   MCHC 34.9  30.0 - 36.0 g/dL   RDW 13.9  11.5 - 15.5 %   Platelets 251  150 - 400 K/uL   Neutrophils Relative 65  43 - 77 %   Neutro Abs 4.7  1.7 - 7.7 K/uL   Lymphocytes Relative 21  12 - 46 %   Lymphs Abs 1.5  0.7 - 4.0 K/uL   Monocytes Relative 10  3 - 12 %   Monocytes Absolute 0.8  0.1 - 1.0 K/uL   Eosinophils Relative 3  0 - 5 %   Eosinophils Absolute 0.2  0.0 - 0.7 K/uL   Basophils Relative 0  0 - 1 %   Basophils Absolute 0.0  0.0 - 0.1 K/uL  COMPREHENSIVE METABOLIC PANEL      Component Value Range   Sodium 138  135 - 145 mEq/L   Potassium 3.3 (*) 3.5 - 5.1 mEq/L   Chloride 101  96 - 112 mEq/L   CO2 24  19 - 32 mEq/L   Glucose, Bld 194 (*) 70 - 99 mg/dL   BUN 18  6 - 23 mg/dL   Creatinine, Ser 0.90  0.50 - 1.35 mg/dL   Calcium 9.7  8.4 - 10.5 mg/dL   Total Protein 8.2  6.0 - 8.3 g/dL   Albumin 5.0  3.5 - 5.2 g/dL   AST 30  0 - 37 U/L   ALT 42  0 - 53 U/L   Alkaline Phosphatase 59  39 - 117 U/L   Total Bilirubin 0.5  0.3 - 1.2 mg/dL   GFR calc non Af Amer >90  >90 mL/min   GFR calc Af Amer >90  >90 mL/min  LIPASE, BLOOD      Component Value Range   Lipase 20  11 - 59 U/L  URINALYSIS, ROUTINE W REFLEX MICROSCOPIC      Component Value Range   Color, Urine YELLOW  YELLOW   APPearance CLOUDY (*) CLEAR   Specific Gravity, Urine 1.025  1.005 - 1.030   pH 5.5  5.0 - 8.0   Glucose, UA NEGATIVE  NEGATIVE mg/dL   Hgb urine dipstick NEGATIVE  NEGATIVE   Bilirubin Urine SMALL (*) NEGATIVE   Ketones, ur 15 (*) NEGATIVE mg/dL   Protein, ur 100 (*) NEGATIVE mg/dL   Urobilinogen, UA 0.2  0.0 - 1.0 mg/dL   Nitrite NEGATIVE  NEGATIVE   Leukocytes, UA NEGATIVE  NEGATIVE  GLUCOSE, CAPILLARY      Component Value Range   Glucose-Capillary 135 (*) 70 - 99 mg/dL   Comment 1 Notify RN     Comment 2 Documented in Chart    URINE MICROSCOPIC-ADD ON  Component  Value Range   Squamous Epithelial / LPF RARE  RARE   Bacteria, UA RARE  RARE   No results found.    MDM  Pt given IV fluids,  zofran and lomotil.   Pt had several episodes of diarrhea initially,   Pt given Iv saline x 3 liters over 7 hours,   Pt reports feeling much better,  Pt reports decreased diarrhea.  Pt advised to follow up with Dr. Jimmye Norman for recheck tomorrow.  Drink plenty of fluids.   Pt given pottasium po.   Pt given cipro x 1 dosage.   Stool cultures ordered.   Pt may have gi virus,  Pt also has itchy rash which he has had since November.  Pt reports he thought he had chicken mites but he no lonfer has chickens,   Pt given rx for [place], [place] 05/23/12 [place], [place] 05/23/12 2300

## 2012-05-23 NOTE — ED Notes (Addendum)
The patient's CBG at this time is 135 mg/dL.

## 2012-05-24 NOTE — ED Provider Notes (Signed)
History/physical exam/procedure(s) were performed by non-physician practitioner and as supervising physician I was immediately available for consultation/collaboration. I have reviewed all notes and am in agreement with care and plan.   Shaune Pollack, MD 05/24/12 [phone]

## 2012-05-27 LAB — STOOL CULTURE

## 2012-06-17 ENCOUNTER — Emergency Department (HOSPITAL_BASED_OUTPATIENT_CLINIC_OR_DEPARTMENT_OTHER): Payer: BC Managed Care – PPO

## 2012-06-17 ENCOUNTER — Encounter (HOSPITAL_BASED_OUTPATIENT_CLINIC_OR_DEPARTMENT_OTHER): Payer: Self-pay | Admitting: Emergency Medicine

## 2012-06-17 ENCOUNTER — Emergency Department (HOSPITAL_BASED_OUTPATIENT_CLINIC_OR_DEPARTMENT_OTHER)
Admission: EM | Admit: 2012-06-17 | Discharge: 2012-06-17 | Disposition: A | Discharge status: Home / Self Care | Payer: BC Managed Care – PPO | Attending: Emergency Medicine | Admitting: Emergency Medicine

## 2012-06-17 DIAGNOSIS — Z87442 Personal history of urinary calculi: Secondary | ICD-10-CM | POA: Insufficient documentation

## 2012-06-17 DIAGNOSIS — Z8669 Personal history of other diseases of the nervous system and sense organs: Secondary | ICD-10-CM | POA: Insufficient documentation

## 2012-06-17 DIAGNOSIS — J069 Acute upper respiratory infection, unspecified: Secondary | ICD-10-CM

## 2012-06-17 DIAGNOSIS — Z794 Long term (current) use of insulin: Secondary | ICD-10-CM | POA: Insufficient documentation

## 2012-06-17 DIAGNOSIS — I10 Essential (primary) hypertension: Secondary | ICD-10-CM | POA: Insufficient documentation

## 2012-06-17 DIAGNOSIS — I509 Heart failure, unspecified: Secondary | ICD-10-CM | POA: Insufficient documentation

## 2012-06-17 DIAGNOSIS — Z79899 Other long term (current) drug therapy: Secondary | ICD-10-CM | POA: Insufficient documentation

## 2012-06-17 DIAGNOSIS — R059 Cough, unspecified: Secondary | ICD-10-CM | POA: Insufficient documentation

## 2012-06-17 DIAGNOSIS — E119 Type 2 diabetes mellitus without complications: Secondary | ICD-10-CM | POA: Insufficient documentation

## 2012-06-17 DIAGNOSIS — Z8701 Personal history of pneumonia (recurrent): Secondary | ICD-10-CM | POA: Insufficient documentation

## 2012-06-17 DIAGNOSIS — R05 Cough: Secondary | ICD-10-CM | POA: Insufficient documentation

## 2012-06-17 DIAGNOSIS — Z8719 Personal history of other diseases of the digestive system: Secondary | ICD-10-CM | POA: Insufficient documentation

## 2012-06-17 DIAGNOSIS — Z7982 Long term (current) use of aspirin: Secondary | ICD-10-CM | POA: Insufficient documentation

## 2012-06-17 MED ORDER — BENZONATATE 100 MG PO CAPS
100.0000 mg | ORAL_CAPSULE | Freq: Once | ORAL | Status: AC
  Administered 2012-06-17: 100 mg via ORAL
  Filled 2012-06-17: qty 1

## 2012-06-17 MED ORDER — FLUTICASONE PROPIONATE 50 MCG/ACT NA SUSP: 2.0000 | Freq: Every day | NASAL | Status: DC

## 2012-06-17 MED ORDER — DESLORATADINE 5 MG PO TABS: 5.0000 mg | ORAL_TABLET | Freq: Every day | ORAL | Status: DC

## 2012-06-17 MED ORDER — TRAMADOL HCL 50 MG PO TABS
50.0000 mg | ORAL_TABLET | Freq: Once | ORAL | Status: AC
  Administered 2012-06-17: 50 mg via ORAL
  Filled 2012-06-17: qty 1

## 2012-06-17 NOTE — ED Provider Notes (Signed)
History     CSN: [id number]  Arrival date & time 06/17/12  8921   First MD Initiated Contact with Patient 06/17/12 0340      Chief Complaint  Patient presents with  . Nasal Congestion    (Consider location/radiation/quality/duration/timing/severity/associated sxs/prior treatment) Patient is a 52 y.o. male presenting with URI. The history is provided by the patient. No language interpreter was used.  URI The primary symptoms include cough. Primary symptoms do not include fever, headaches, ear pain, sore throat, swollen glands, wheezing, abdominal pain, myalgias or rash. Primary symptoms comment: nasal congestion This is a new problem. The problem has not changed since onset. The cough began 6 to 7 days ago. The cough is new. The cough is non-productive.  Symptoms associated with the illness include congestion. The following treatments were addressed: Aspirin was not tried. NSAIDs were not tried. Risk factors for severe complications from URI include diabetes mellitus.    Past Medical History  Diagnosis Date  . Diabetes mellitus   . Hypertension   . Pancreatitis   . Kidney stone   . Sleep apnea   . CHF (congestive heart failure)   . Pneumonia     Past Surgical History  Procedure Date  . Cholecystectomy   . Multiple cysts removal-hip,wrist     History reviewed. No pertinent family history.  History  Substance Use Topics  . Smoking status: Never Smoker   . Smokeless tobacco: Not on file  . Alcohol Use: No      Review of Systems  Constitutional: Negative for fever.  HENT: Positive for congestion. Negative for ear pain and sore throat.   Respiratory: Positive for cough. Negative for wheezing.   Gastrointestinal: Negative for abdominal pain.  Musculoskeletal: Negative for myalgias.  Skin: Negative for rash.  Neurological: Negative for headaches.  All other systems reviewed and are negative.    Allergies  Codeine  Home Medications   Current Outpatient Rx    Name  Route  Sig  Dispense  Refill  . PSEUDOEPHEDRINE HCL 30 MG PO TABS   Oral   Take 30 mg by mouth every 4 (four) hours as needed.         . ALBUTEROL SULFATE HFA 108 (90 BASE) MCG/ACT IN AERS   Inhalation   Inhale 2 puffs into the lungs every 6 (six) hours as needed. For shortness of breath          . ASPIRIN 81 MG PO TABS   Oral   Take 81 mg by mouth daily.           Marland Kitchen CALCIUM & MAGNESIUM CARBONATES 311-232 MG PO TABS   Oral   Take 2 tablets by mouth daily as needed. For indigestion          . CARISOPRODOL 350 MG PO TABS   Oral   Take 350 mg by mouth 3 (three) times daily as needed. For pain           . CIPROFLOXACIN HCL 500 MG PO TABS   Oral   Take 1 tablet (500 mg total) by mouth every 12 (twelve) hours.   14 tablet   0   . CITALOPRAM HYDROBROMIDE 40 MG PO TABS   Oral   Take 40 mg by mouth daily.           . CYANOCOBALAMIN 1000 MCG/ML IJ SOLN   Intramuscular   Inject 1,000 mcg into the muscle once a week.          Marland Kitchen  DIPHENHYDRAMINE HCL 25 MG PO TABS   Oral   Take 25 mg by mouth daily as needed. For allergies          . DIPHENOXYLATE-ATROPINE 2.5-0.025 MG PO TABS   Oral   Take 1 tablet by mouth 4 (four) times daily as needed for diarrhea or loose stools.   30 tablet   0   . FUROSEMIDE 40 MG PO TABS   Oral   Take 40 mg by mouth 2 (two) times daily.           Marland Kitchen GLIMEPIRIDE 4 MG PO TABS   Oral   Take 4 mg by mouth 2 (two) times daily.           Marland Kitchen GLUCOSAMINE 500 MG PO TABS   Oral   Take 500 mg by mouth 2 (two) times daily.           Marland Kitchen HYDROCODONE-ACETAMINOPHEN 5-325 MG PO TABS   Oral   Take 1 tablet by mouth every 4 (four) hours as needed. For pain          . INSULIN GLARGINE 100 UNIT/ML [place] SOLN   Subcutaneous   Inject 80 Units into the skin 2 (two) times daily.           Marland Kitchen LIRAGLUTIDE 18 MG/3ML  SOLN   Subcutaneous   Inject into the skin daily.          Marland Kitchen LISINOPRIL 40 MG PO TABS   Oral   Take 40 mg by mouth 2  (two) times daily.          Marland Kitchen LOPERAMIDE HCL 2 MG PO CAPS   Oral   Take 2 mg by mouth 4 (four) times daily as needed.         Marland Kitchen LORATADINE 10 MG PO TABS   Oral   Take 10 mg by mouth daily.           Marland Kitchen LORAZEPAM 1 MG PO TABS   Oral   Take 1 mg by mouth 2 (two) times daily.           Marland Kitchen ONDANSETRON 4 MG PO TBDP   Oral   Take 1 tablet (4 mg total) by mouth every 8 (eight) hours as needed for nausea.   20 tablet   0   . PERMETHRIN 5 % EX CREA      Apply to affected area once   60 g   0   . POTASSIUM GLUCONATE 595 MG PO CAPS   Oral   Take 595 mg by mouth once.         Marland Kitchen PREGABALIN 75 MG PO CAPS   Oral   Take 75 mg by mouth 2 (two) times daily.         Marland Kitchen TIOTROPIUM BROMIDE MONOHYDRATE 18 MCG IN CAPS   Inhalation   Place 18 mcg into inhaler and inhale daily as needed. For shortness of breath         . TRAMADOL HCL 50 MG PO TABS   Oral   Take 50 mg by mouth 3 (three) times daily as needed. For pain. Maximum dose= 8 tablets per day         . VITAMIN D (ERGOCALCIFEROL) 50000 UNITS PO CAPS   Oral   Take 50,000 Units by mouth once a week. For 12 weeks           BP 149/88  Pulse 86  Temp 97.9 F (36.6 C) (Oral)  Resp 23  Ht 5' 10"  (1.778 m)  Wt 272 lb (123.378 kg)  BMI 39.03 kg/m2  SpO2 100%  Physical Exam  Constitutional: He is oriented to person, place, and time. He appears well-developed and well-nourished. No distress.  HENT:  Head: Normocephalic and atraumatic.  Mouth/Throat: Oropharynx is clear and moist.  Eyes: Conjunctivae normal and EOM are normal. Pupils are equal, round, and reactive to light.  Neck: Normal range of motion. Neck supple.  Cardiovascular: Normal rate and regular rhythm.   Pulmonary/Chest: Effort normal and breath sounds normal. He has no wheezes. He has no rales.  Abdominal: Soft. Bowel sounds are normal. There is no tenderness. There is no rebound and no guarding.  Musculoskeletal: Normal range of motion.    Lymphadenopathy:    He has no cervical adenopathy.  Neurological: He is alert and oriented to person, place, and time.  Skin: Skin is warm and dry.  Psychiatric: He has a normal mood and affect.    ED Course  Procedures (including critical care time)   Labs Reviewed  D-DIMER, QUANTITATIVE   Dg Chest 2 View  06/17/2012  *RADIOLOGY REPORT*  Clinical Data: Cough and nasal congestion.  CHEST - 2 VIEW  Comparison: Chest radiograph performed 05/14/2010  Findings: The lungs are well-aerated.  Pulmonary vascularity is at the upper limits of normal.  There is no evidence of focal opacification, pleural effusion or pneumothorax.  The heart is normal in size; the mediastinal contour is within normal limits.  No acute osseous abnormalities are seen.  Clips are noted within the right upper quadrant, reflecting prior cholecystectomy.  IMPRESSION: No acute cardiopulmonary process seen.   Original Report Authenticated By: Santa Lighter, M.D.      No diagnosis found.    MDM  Will treat symptomatically         Markiyah Gahm K Sadiyah Kangas-Rasch, MD 06/17/12 [phone]

## 2012-06-17 NOTE — ED Notes (Signed)
Pt reports symptoms of chest congestion, nasal congestion for several days

## 2012-07-14 ENCOUNTER — Encounter (HOSPITAL_BASED_OUTPATIENT_CLINIC_OR_DEPARTMENT_OTHER): Payer: Self-pay | Admitting: *Deleted

## 2012-07-14 ENCOUNTER — Emergency Department (HOSPITAL_BASED_OUTPATIENT_CLINIC_OR_DEPARTMENT_OTHER): Payer: BC Managed Care – PPO

## 2012-07-14 ENCOUNTER — Emergency Department (HOSPITAL_BASED_OUTPATIENT_CLINIC_OR_DEPARTMENT_OTHER)
Admission: EM | Admit: 2012-07-14 | Discharge: 2012-07-15 | Disposition: A | Discharge status: Home / Self Care | Payer: BC Managed Care – PPO | Attending: Emergency Medicine | Admitting: Emergency Medicine

## 2012-07-14 DIAGNOSIS — Z8719 Personal history of other diseases of the digestive system: Secondary | ICD-10-CM | POA: Insufficient documentation

## 2012-07-14 DIAGNOSIS — IMO0002 Reserved for concepts with insufficient information to code with codable children: Secondary | ICD-10-CM | POA: Insufficient documentation

## 2012-07-14 DIAGNOSIS — Z79899 Other long term (current) drug therapy: Secondary | ICD-10-CM | POA: Insufficient documentation

## 2012-07-14 DIAGNOSIS — I509 Heart failure, unspecified: Secondary | ICD-10-CM | POA: Insufficient documentation

## 2012-07-14 DIAGNOSIS — I10 Essential (primary) hypertension: Secondary | ICD-10-CM | POA: Insufficient documentation

## 2012-07-14 DIAGNOSIS — Z7982 Long term (current) use of aspirin: Secondary | ICD-10-CM | POA: Insufficient documentation

## 2012-07-14 DIAGNOSIS — Z8701 Personal history of pneumonia (recurrent): Secondary | ICD-10-CM | POA: Insufficient documentation

## 2012-07-14 DIAGNOSIS — E119 Type 2 diabetes mellitus without complications: Secondary | ICD-10-CM | POA: Insufficient documentation

## 2012-07-14 DIAGNOSIS — Z87442 Personal history of urinary calculi: Secondary | ICD-10-CM | POA: Insufficient documentation

## 2012-07-14 DIAGNOSIS — Z794 Long term (current) use of insulin: Secondary | ICD-10-CM | POA: Insufficient documentation

## 2012-07-14 DIAGNOSIS — Z8709 Personal history of other diseases of the respiratory system: Secondary | ICD-10-CM | POA: Insufficient documentation

## 2012-07-14 DIAGNOSIS — M545 Low back pain, unspecified: Secondary | ICD-10-CM | POA: Insufficient documentation

## 2012-07-14 DIAGNOSIS — Z8739 Personal history of other diseases of the musculoskeletal system and connective tissue: Secondary | ICD-10-CM | POA: Insufficient documentation

## 2012-07-14 LAB — CBC WITH DIFFERENTIAL/PLATELET
Basophils Absolute: 0 10*3/uL (ref 0.0–0.1)
Eosinophils Absolute: 0.2 10*3/uL (ref 0.0–0.7)
Lymphocytes Relative: 47 % — ABNORMAL HIGH (ref 12–46)
Lymphs Abs: 2.5 10*3/uL (ref 0.7–4.0)
Neutrophils Relative %: 40 % — ABNORMAL LOW (ref 43–77)
Platelets: 206 10*3/uL (ref 150–400)
RBC: 4.67 MIL/uL (ref 4.22–5.81)
WBC: 5.3 10*3/uL (ref 4.0–10.5)

## 2012-07-14 LAB — URINALYSIS, ROUTINE W REFLEX MICROSCOPIC
Glucose, UA: NEGATIVE mg/dL
Leukocytes, UA: NEGATIVE
Nitrite: NEGATIVE
Protein, ur: NEGATIVE mg/dL
Urobilinogen, UA: 0.2 mg/dL (ref 0.0–1.0)

## 2012-07-14 LAB — BASIC METABOLIC PANEL
Calcium: 9.6 mg/dL (ref 8.4–10.5)
GFR calc Af Amer: 78 mL/min — ABNORMAL LOW (ref >90)
GFR calc non Af Amer: 67 mL/min — ABNORMAL LOW (ref >90)
Sodium: 140 mEq/L (ref 135–145)

## 2012-07-14 MED ORDER — HYDROMORPHONE HCL PF 1 MG/ML IJ SOLN
1.0000 mg | Freq: Once | INTRAMUSCULAR | Status: AC
  Administered 2012-07-14: 1 mg via INTRAVENOUS
  Filled 2012-07-14: qty 1

## 2012-07-14 MED ORDER — SODIUM CHLORIDE 0.9 % IV SOLN
INTRAVENOUS | Status: DC
  Administered 2012-07-14 – 2012-07-15 (×2): via INTRAVENOUS

## 2012-07-14 MED ORDER — ONDANSETRON HCL 4 MG/2ML IJ SOLN
4.0000 mg | Freq: Once | INTRAMUSCULAR | Status: AC
  Administered 2012-07-14: 4 mg via INTRAVENOUS
  Filled 2012-07-14: qty 2

## 2012-07-14 MED ORDER — HYDROMORPHONE HCL PF 2 MG/ML IJ SOLN
2.0000 mg | Freq: Once | INTRAMUSCULAR | Status: AC
  Administered 2012-07-14: 2 mg via INTRAVENOUS
  Filled 2012-07-14: qty 1

## 2012-07-14 NOTE — ED Notes (Signed)
Pt states he has had back pain x 3 weeks. Worse over past week. Hx stone 7-8 yrs ago. Also has bulging disc.

## 2012-07-14 NOTE — ED Notes (Signed)
Brother Timmy cell phone [phone] pty request number placed in chart for emergency purposes

## 2012-07-14 NOTE — ED Provider Notes (Signed)
History     CSN: [id number]  Arrival date & time 07/14/12  2222   First MD Initiated Contact with Patient 07/14/12 2248      Chief Complaint  Patient presents with  . Flank Pain    (Consider location/radiation/quality/duration/timing/severity/associated sxs/prior treatment) HPI This is a 52 year old male with a history of kidney stones and degenerative disc disease of the back. He is here with lower back pain for about the past week. It suddenly worsened this afternoon about 4:30 and is now severe. It is located in the right paralumbar region. He describes it as a grabbing pain, similar to past kidney stone. He is worse with movement or palpation. He denies any associated numbness or weakness. The pain does radiate to the right groin. He denies any saddle anesthesia, bowel or bladder incontinence or inability to void.  Past Medical History  Diagnosis Date  . Diabetes mellitus   . Hypertension   . Pancreatitis   . Kidney stone   . Sleep apnea   . CHF (congestive heart failure)   . Pneumonia     Past Surgical History  Procedure Laterality Date  . Cholecystectomy    . Multiple cysts removal-hip,wrist      History reviewed. No pertinent family history.  History  Substance Use Topics  . Smoking status: Never Smoker   . Smokeless tobacco: Not on file  . Alcohol Use: No      Review of Systems  All other systems reviewed and are negative.    Allergies  Codeine  Home Medications   Current Outpatient Rx  Name  Route  Sig  Dispense  Refill  . albuterol (PROVENTIL HFA;VENTOLIN HFA) 108 (90 BASE) MCG/ACT inhaler   Inhalation   Inhale 2 puffs into the lungs every 6 (six) hours as needed. For shortness of breath          . aspirin 81 MG tablet   Oral   Take 81 mg by mouth daily.           . calcium & magnesium carbonates (MYLANTA) 086-578 MG per tablet   Oral   Take 2 tablets by mouth daily as needed. For indigestion          . carisoprodol (SOMA) 350 MG  tablet   Oral   Take 350 mg by mouth 3 (three) times daily as needed. For pain           . ciprofloxacin (CIPRO) 500 MG tablet   Oral   Take 1 tablet (500 mg total) by mouth every 12 (twelve) hours.   14 tablet   0   . citalopram (CELEXA) 40 MG tablet   Oral   Take 40 mg by mouth daily.           . cyanocobalamin (,VITAMIN B-12,) 1000 MCG/ML injection   Intramuscular   Inject 1,000 mcg into the muscle once a week.          . desloratadine (CLARINEX) 5 MG tablet   Oral   Take 1 tablet (5 mg total) by mouth daily.   7 tablet   0   . diphenhydrAMINE (BENADRYL) 25 MG tablet   Oral   Take 25 mg by mouth daily as needed. For allergies          . diphenoxylate-atropine (LOMOTIL) 2.5-0.025 MG per tablet   Oral   Take 1 tablet by mouth 4 (four) times daily as needed for diarrhea or loose stools.   30 tablet   0   .  fluticasone (FLONASE) 50 MCG/ACT nasal spray   Nasal   Place 2 sprays into the nose daily.   16 g   0   . furosemide (LASIX) 40 MG tablet   Oral   Take 40 mg by mouth 2 (two) times daily.           Marland Kitchen glimepiride (AMARYL) 4 MG tablet   Oral   Take 4 mg by mouth 2 (two) times daily.           . Glucosamine 500 MG TABS   Oral   Take 500 mg by mouth 2 (two) times daily.           Marland Kitchen HYDROcodone-acetaminophen (NORCO) 5-325 MG per tablet   Oral   Take 1 tablet by mouth every 4 (four) hours as needed. For pain          . insulin glargine (LANTUS SOLOSTAR) 100 UNIT/ML injection   Subcutaneous   Inject 80 Units into the skin 2 (two) times daily.           . Liraglutide (VICTOZA) 18 MG/3ML SOLN   Subcutaneous   Inject into the skin daily.          Marland Kitchen lisinopril (PRINIVIL,ZESTRIL) 40 MG tablet   Oral   Take 40 mg by mouth 2 (two) times daily.          Marland Kitchen loperamide (IMODIUM) 2 MG capsule   Oral   Take 2 mg by mouth 4 (four) times daily as needed.         . loratadine (CLARITIN) 10 MG tablet   Oral   Take 10 mg by mouth daily.            Marland Kitchen LORazepam (ATIVAN) 1 MG tablet   Oral   Take 1 mg by mouth 2 (two) times daily.           . ondansetron (ZOFRAN ODT) 4 MG disintegrating tablet   Oral   Take 1 tablet (4 mg total) by mouth every 8 (eight) hours as needed for nausea.   20 tablet   0   . permethrin (ELIMITE) 5 % cream      Apply to affected area once   60 g   0   . Potassium Gluconate 595 MG CAPS   Oral   Take 595 mg by mouth once.         . pregabalin (LYRICA) 75 MG capsule   Oral   Take 75 mg by mouth 2 (two) times daily.         . pseudoephedrine (SUDAFED) 30 MG tablet   Oral   Take 30 mg by mouth every 4 (four) hours as needed.         . tiotropium (SPIRIVA) 18 MCG inhalation capsule   Inhalation   Place 18 mcg into inhaler and inhale daily as needed. For shortness of breath         . traMADol (ULTRAM) 50 MG tablet   Oral   Take 50 mg by mouth 3 (three) times daily as needed. For pain. Maximum dose= 8 tablets per day         . Vitamin D, Ergocalciferol, (DRISDOL) 50000 UNITS CAPS   Oral   Take 50,000 Units by mouth once a week. For 12 weeks           BP 148/76  Pulse 78  Temp(Src) 97.8 F (36.6 C) (Oral)  Resp 20  Ht 5' 10"  (1.778 m)  Wt  267 lb (121.11 kg)  BMI 38.31 kg/m2  SpO2 97%  Physical Exam General: Well-developed, well-nourished male in no acute distress; appearance consistent with age of record HENT: normocephalic, atraumatic Eyes: pupils equal round and reactive to light; extraocular muscles intact Neck: supple Heart: regular rate and rhythm Lungs: clear to auscultation bilaterally Abdomen: soft; nondistended; right lower quadrant tenderness; bowel sounds present Back: Right paralumbar tenderness Extremities: No deformity; full range of motion Neurologic: Awake, alert and oriented; motor function intact in all extremities and symmetric; no facial droop Skin: Warm and dry Psychiatric: Anxious    ED Course  Procedures (including critical care  time)     MDM   Nursing notes and vitals signs, including pulse oximetry, reviewed.  Summary of this visit's results, reviewed by myself:  Labs:  Results for orders placed during the hospital encounter of 07/14/12 (from the past 24 hour(s))  URINALYSIS, ROUTINE W REFLEX MICROSCOPIC     Status: None   Collection Time    07/14/12 10:26 PM      Result Value Range   Color, Urine YELLOW  YELLOW   APPearance CLEAR  CLEAR   Specific Gravity, Urine 1.027  1.005 - 1.030   pH 6.5  5.0 - 8.0   Glucose, UA NEGATIVE  NEGATIVE mg/dL   Hgb urine dipstick NEGATIVE  NEGATIVE   Bilirubin Urine NEGATIVE  NEGATIVE   Ketones, ur NEGATIVE  NEGATIVE mg/dL   Protein, ur NEGATIVE  NEGATIVE mg/dL   Urobilinogen, UA 0.2  0.0 - 1.0 mg/dL   Nitrite NEGATIVE  NEGATIVE   Leukocytes, UA NEGATIVE  NEGATIVE  BASIC METABOLIC PANEL     Status: Abnormal   Collection Time    07/14/12 11:03 PM      Result Value Range   Sodium 140  135 - 145 mEq/L   Potassium 3.7  3.5 - 5.1 mEq/L   Chloride 103  96 - 112 mEq/L   CO2 27  19 - 32 mEq/L   Glucose, Bld 135 (*) 70 - 99 mg/dL   BUN 14  6 - 23 mg/dL   Creatinine, Ser 1.20  0.50 - 1.35 mg/dL   Calcium 9.6  8.4 - 10.5 mg/dL   GFR calc non Af Amer 67 (*) >90 mL/min   GFR calc Af Amer 78 (*) >90 mL/min  CBC WITH DIFFERENTIAL     Status: Abnormal   Collection Time    07/14/12 11:03 PM      Result Value Range   WBC 5.3  4.0 - 10.5 K/uL   RBC 4.67  4.22 - 5.81 MIL/uL   Hemoglobin 14.0  13.0 - 17.0 g/dL   HCT 40.2  39.0 - 52.0 %   MCV 86.1  78.0 - 100.0 fL   MCH 30.0  26.0 - 34.0 pg   MCHC 34.8  30.0 - 36.0 g/dL   RDW 13.0  11.5 - 15.5 %   Platelets 206  150 - 400 K/uL   Neutrophils Relative 40 (*) 43 - 77 %   Neutro Abs 2.1  1.7 - 7.7 K/uL   Lymphocytes Relative 47 (*) 12 - 46 %   Lymphs Abs 2.5  0.7 - 4.0 K/uL   Monocytes Relative 9  3 - 12 %   Monocytes Absolute 0.5  0.1 - 1.0 K/uL   Eosinophils Relative 4  0 - 5 %   Eosinophils Absolute 0.2  0.0 - 0.7  K/uL   Basophils Relative 1  0 - 1 %  Basophils Absolute 0.0  0.0 - 0.1 K/uL    Imaging Studies: Ct Abdomen Pelvis Wo Contrast  07/15/2012  *RADIOLOGY REPORT*  Clinical Data: Right flank pain.  CT ABDOMEN AND PELVIS WITHOUT CONTRAST  Technique:  Multidetector CT imaging of the abdomen and pelvis was performed following the standard protocol without intravenous contrast.  Comparison: CT of the abdomen and pelvis 04/22/2011.  Findings:  Lung Bases: Linear opacities in the periphery of the left lower lobe are compatible with subsegmental atelectasis or scarring. Atherosclerosis in the left anterior descending and right coronary arteries.  Abdomen/Pelvis:  There are no abnormal calcifications within the collecting system of either kidney, along the course of either ureter, or within the lumen of the urinary bladder.  No hydroureteronephrosis to suggest urinary tract obstruction at this time.  Status post cholecystectomy.  The unenhanced appearance of the liver, pancreas, spleen and bilateral adrenal glands is unremarkable.  There is a very mild haziness throughout the small bowel mesentery with numerous prominent but not enlarged mesenteric lymph nodes, which is a highly nonspecific finding that is similar to prior examinations and presumably chronic.  No significant volume of ascites.  No pneumoperitoneum.  No pathologic distension of small bowel.  Normal appendix.  No definite pathologic lymphadenopathy identified within the abdomen or pelvis on this noncontrast CT examination.  Tiny umbilical hernia containing only omental fat incidentally noted.  Prostate and urinary bladder are unremarkable in appearance.  Musculoskeletal: There are no aggressive appearing lytic or blastic lesions noted in the visualized portions of the skeleton.  IMPRESSION: 1.  No acute findings in the abdomen or pelvis to account for patient's symptoms.  Specifically, no evidence of urinary tract calculi and no signs of urinary tract  obstruction. 2.  Normal appendix. 3.  Status post cholecystectomy. 4.  Tiny umbilical hernia containing only omental fat. 5.  Atherosclerosis, including left anterior descending and right coronary artery disease. Please note that although the presence of coronary artery calcium documents the presence of coronary artery disease, the severity of this disease and any potential stenosis cannot be assessed on this non-gated CT examination.  Assessment for potential risk factor modification, dietary therapy or pharmacologic therapy may be warranted, if clinically indicated.   Original Report Authenticated By: Vinnie Langton, M.D.    1:28 AM Pain was not relieved with 3 mg of IV Dilaudid. Pain now improved with 100 mcg of fentanyl and 50 mg of Toradol IV.  3:47 AM The patient's pain is not been completely controlled despite multiple narcotic medications. He states that hydrocodone works better for him than oxycodone. It was apparent during his ED course that Dilaudid does not work well for him. We will place him on a higher dose hydrocodone he'll contact his primary care physician tomorrow.          Wynetta Fines, MD 07/15/12 [phone]

## 2012-07-15 MED ORDER — HYDROCODONE-ACETAMINOPHEN 10-325 MG PO TABS: 1.0000 | ORAL_TABLET | ORAL | Status: DC | PRN

## 2012-07-15 MED ORDER — KETOROLAC TROMETHAMINE 15 MG/ML IJ SOLN
15.0000 mg | Freq: Once | INTRAMUSCULAR | Status: AC
  Administered 2012-07-15: 15 mg via INTRAVENOUS
  Filled 2012-07-15: qty 1

## 2012-07-15 MED ORDER — FENTANYL CITRATE 0.05 MG/ML IJ SOLN
100.0000 ug | Freq: Once | INTRAMUSCULAR | Status: AC
  Administered 2012-07-15: 100 ug via INTRAVENOUS
  Filled 2012-07-15: qty 2

## 2012-07-15 MED ORDER — HYDROCODONE-ACETAMINOPHEN 5-325 MG PO TABS
2.0000 | ORAL_TABLET | Freq: Once | ORAL | Status: AC
  Administered 2012-07-15: 2 via ORAL
  Filled 2012-07-15: qty 2

## 2012-10-08 ENCOUNTER — Encounter (HOSPITAL_BASED_OUTPATIENT_CLINIC_OR_DEPARTMENT_OTHER): Payer: Self-pay

## 2012-10-08 ENCOUNTER — Emergency Department (HOSPITAL_BASED_OUTPATIENT_CLINIC_OR_DEPARTMENT_OTHER)
Admission: EM | Admit: 2012-10-08 | Discharge: 2012-10-08 | Disposition: A | Discharge status: Home / Self Care | Payer: BC Managed Care – PPO | Attending: Emergency Medicine | Admitting: Emergency Medicine

## 2012-10-08 ENCOUNTER — Emergency Department (HOSPITAL_BASED_OUTPATIENT_CLINIC_OR_DEPARTMENT_OTHER): Payer: BC Managed Care – PPO

## 2012-10-08 DIAGNOSIS — Y939 Activity, unspecified: Secondary | ICD-10-CM | POA: Insufficient documentation

## 2012-10-08 DIAGNOSIS — S93409A Sprain of unspecified ligament of unspecified ankle, initial encounter: Secondary | ICD-10-CM | POA: Insufficient documentation

## 2012-10-08 DIAGNOSIS — E669 Obesity, unspecified: Secondary | ICD-10-CM | POA: Insufficient documentation

## 2012-10-08 DIAGNOSIS — R079 Chest pain, unspecified: Secondary | ICD-10-CM | POA: Insufficient documentation

## 2012-10-08 DIAGNOSIS — Z8701 Personal history of pneumonia (recurrent): Secondary | ICD-10-CM | POA: Insufficient documentation

## 2012-10-08 DIAGNOSIS — R0602 Shortness of breath: Secondary | ICD-10-CM | POA: Insufficient documentation

## 2012-10-08 DIAGNOSIS — Z8719 Personal history of other diseases of the digestive system: Secondary | ICD-10-CM | POA: Insufficient documentation

## 2012-10-08 DIAGNOSIS — Y929 Unspecified place or not applicable: Secondary | ICD-10-CM | POA: Insufficient documentation

## 2012-10-08 DIAGNOSIS — I509 Heart failure, unspecified: Secondary | ICD-10-CM | POA: Insufficient documentation

## 2012-10-08 DIAGNOSIS — K921 Melena: Secondary | ICD-10-CM | POA: Insufficient documentation

## 2012-10-08 DIAGNOSIS — Z794 Long term (current) use of insulin: Secondary | ICD-10-CM | POA: Insufficient documentation

## 2012-10-08 DIAGNOSIS — R1084 Generalized abdominal pain: Secondary | ICD-10-CM | POA: Insufficient documentation

## 2012-10-08 DIAGNOSIS — IMO0002 Reserved for concepts with insufficient information to code with codable children: Secondary | ICD-10-CM | POA: Insufficient documentation

## 2012-10-08 DIAGNOSIS — Z87442 Personal history of urinary calculi: Secondary | ICD-10-CM | POA: Insufficient documentation

## 2012-10-08 DIAGNOSIS — E119 Type 2 diabetes mellitus without complications: Secondary | ICD-10-CM | POA: Insufficient documentation

## 2012-10-08 DIAGNOSIS — Z79899 Other long term (current) drug therapy: Secondary | ICD-10-CM | POA: Insufficient documentation

## 2012-10-08 DIAGNOSIS — I10 Essential (primary) hypertension: Secondary | ICD-10-CM | POA: Insufficient documentation

## 2012-10-08 DIAGNOSIS — Z7982 Long term (current) use of aspirin: Secondary | ICD-10-CM | POA: Insufficient documentation

## 2012-10-08 DIAGNOSIS — W172XXA Fall into hole, initial encounter: Secondary | ICD-10-CM | POA: Insufficient documentation

## 2012-10-08 HISTORY — DX: Unspecified cirrhosis of liver: K74.60

## 2012-10-08 LAB — OCCULT BLOOD X 1 CARD TO LAB, STOOL: Fecal Occult Bld: NEGATIVE

## 2012-10-08 NOTE — ED Provider Notes (Signed)
History     CSN: [id number]  Arrival date & time 10/08/12  70   First MD Initiated Contact with Patient 10/08/12 1132      Chief Complaint  Patient presents with  . Ankle Pain    (Consider location/radiation/quality/duration/timing/severity/associated sxs/prior treatment) Patient is a 52 y.o. male presenting with ankle pain. The history is provided by the patient and medical records. No language interpreter was used.  Ankle Pain Location:  Ankle Time since incident:  2 weeks Injury: yes   Mechanism of injury: fall   Fall:    Fall occurred: Into a hole.   Height of fall:  Less than 1 foot   Impact surface:  Dirt   Point of impact:  Unable to specify   Entrapped after fall: no   Ankle location:  L ankle and R ankle Pain details:    Quality:  Aching   Radiates to:  Does not radiate   Severity:  Moderate   Onset quality:  Sudden   Duration:  2 weeks   Timing:  Constant   Progression:  Worsening Dislocation: no   Foreign body present:  No foreign bodies Prior injury to area:  Yes Relieved by:  Nothing Worsened by:  Activity Associated symptoms: back pain, stiffness and swelling   Associated symptoms: no fatigue, no fever, no itching, no numbness and no tingling   Risk factors: obesity     Butch Otterson is a 52 y.o. male  with a hx of diabetes, hypertension, pancreatitis, CHF, cirrhosis presents to the Emergency Department complaining of acute onset of bilateral ankle pain after stepping into a hole 2 weeks ago. Patient states he has been ambulatory without difficulty all for 2 weeks and increasing pain and swelling in her bilateral ankles. He is concerned about a fracture. He has associated pain and swelling in the ankles denies numbness, tingling, loss of function.  Patient also presents with several other complaints including vague abdominal pain and black tarry stools beginning 4-5 days ago. He's been eating normally without vomiting or diarrhea and he does not  localize.  He denies nausea, vomiting, diarrhea, weakness, dizziness, syncope, dysuria, hematuria.  Patient also complains of chest pain, shortness of breath and left arm pain persistent for approximately 3 days she relates to his history of CHF but denies a Hx of CAD or MI. He states that he has been generally ill for several days going to bed early.  Patient denies exertional dyspnea.    Past Medical History  Diagnosis Date  . Diabetes mellitus   . Hypertension   . Pancreatitis   . Kidney stone   . Sleep apnea   . CHF (congestive heart failure)   . Pneumonia   . Cirrhosis   . Neck pain     Past Surgical History  Procedure Laterality Date  . Cholecystectomy    . Multiple cysts removal-hip,wrist      No family history on file.  History  Substance Use Topics  . Smoking status: Never Smoker   . Smokeless tobacco: Not on file  . Alcohol Use: No      Review of Systems  Constitutional: Negative for fever, diaphoresis, appetite change, fatigue and unexpected weight change.  HENT: Negative for mouth sores and neck stiffness.   Eyes: Negative for visual disturbance.  Respiratory: Positive for shortness of breath. Negative for cough, chest tightness and wheezing.   Cardiovascular: Positive for chest pain.  Gastrointestinal: Positive for abdominal pain and blood in stool. Negative for nausea,  vomiting, diarrhea and constipation.  Endocrine: Negative for polydipsia, polyphagia and polyuria.  Genitourinary: Negative for dysuria, urgency, frequency and hematuria.  Musculoskeletal: Positive for back pain, arthralgias, gait problem (secondary to pain in ankles) and stiffness.  Skin: Negative for itching and rash.  Allergic/Immunologic: Negative for immunocompromised state.  Neurological: Negative for syncope, light-headedness and headaches.  Hematological: Does not bruise/bleed easily.  Psychiatric/Behavioral: Negative for sleep disturbance. The patient is not nervous/anxious.      Allergies  Codeine  Home Medications   Current Outpatient Rx  Name  Route  Sig  Dispense  Refill  . albuterol (PROVENTIL HFA;VENTOLIN HFA) 108 (90 BASE) MCG/ACT inhaler   Inhalation   Inhale 2 puffs into the lungs every 6 (six) hours as needed. For shortness of breath          . aspirin 81 MG tablet   Oral   Take 81 mg by mouth daily.           . calcium & magnesium carbonates (MYLANTA) 161-096 MG per tablet   Oral   Take 2 tablets by mouth daily as needed. For indigestion          . carisoprodol (SOMA) 350 MG tablet   Oral   Take 350 mg by mouth 3 (three) times daily as needed. For pain           . ciprofloxacin (CIPRO) 500 MG tablet   Oral   Take 1 tablet (500 mg total) by mouth every 12 (twelve) hours.   14 tablet   0   . citalopram (CELEXA) 40 MG tablet   Oral   Take 40 mg by mouth daily.           . cyanocobalamin (,VITAMIN B-12,) 1000 MCG/ML injection   Intramuscular   Inject 1,000 mcg into the muscle once a week.          . desloratadine (CLARINEX) 5 MG tablet   Oral   Take 1 tablet (5 mg total) by mouth daily.   7 tablet   0   . diphenhydrAMINE (BENADRYL) 25 MG tablet   Oral   Take 25 mg by mouth daily as needed. For allergies          . diphenoxylate-atropine (LOMOTIL) 2.5-0.025 MG per tablet   Oral   Take 1 tablet by mouth 4 (four) times daily as needed for diarrhea or loose stools.   30 tablet   0   . fluticasone (FLONASE) 50 MCG/ACT nasal spray   Nasal   Place 2 sprays into the nose daily.   16 g   0   . furosemide (LASIX) 40 MG tablet   Oral   Take 40 mg by mouth 2 (two) times daily.           Marland Kitchen glimepiride (AMARYL) 4 MG tablet   Oral   Take 4 mg by mouth 2 (two) times daily.           . Glucosamine 500 MG TABS   Oral   Take 500 mg by mouth 2 (two) times daily.           Marland Kitchen HYDROcodone-acetaminophen (NORCO) 10-325 MG per tablet   Oral   Take 1 tablet by mouth every 4 (four) hours as needed for pain.   30  tablet   0   . insulin glargine (LANTUS SOLOSTAR) 100 UNIT/ML injection   Subcutaneous   Inject 80 Units into the skin 2 (two) times daily.           Marland Kitchen  Liraglutide (VICTOZA) 18 MG/3ML SOLN   Subcutaneous   Inject into the skin daily.          Marland Kitchen lisinopril (PRINIVIL,ZESTRIL) 40 MG tablet   Oral   Take 40 mg by mouth 2 (two) times daily.          Marland Kitchen loperamide (IMODIUM) 2 MG capsule   Oral   Take 2 mg by mouth 4 (four) times daily as needed.         . loratadine (CLARITIN) 10 MG tablet   Oral   Take 10 mg by mouth daily.           Marland Kitchen LORazepam (ATIVAN) 1 MG tablet   Oral   Take 1 mg by mouth 2 (two) times daily.           . ondansetron (ZOFRAN ODT) 4 MG disintegrating tablet   Oral   Take 1 tablet (4 mg total) by mouth every 8 (eight) hours as needed for nausea.   20 tablet   0   . permethrin (ELIMITE) 5 % cream      Apply to affected area once   60 g   0   . Potassium Gluconate 595 MG CAPS   Oral   Take 595 mg by mouth once.         . pregabalin (LYRICA) 75 MG capsule   Oral   Take 75 mg by mouth 2 (two) times daily.         . pseudoephedrine (SUDAFED) 30 MG tablet   Oral   Take 30 mg by mouth every 4 (four) hours as needed.         . tiotropium (SPIRIVA) 18 MCG inhalation capsule   Inhalation   Place 18 mcg into inhaler and inhale daily as needed. For shortness of breath         . traMADol (ULTRAM) 50 MG tablet   Oral   Take 50 mg by mouth 3 (three) times daily as needed. For pain. Maximum dose= 8 tablets per day         . Vitamin D, Ergocalciferol, (DRISDOL) 50000 UNITS CAPS   Oral   Take 50,000 Units by mouth once a week. For 12 weeks           BP 126/75  Pulse 70  Temp(Src) 97.9 F (36.6 C) (Oral)  Resp 20  Ht 5' 10"  (1.778 m)  Wt 284 lb (128.822 kg)  BMI 40.75 kg/m2  SpO2 97%  Physical Exam  Nursing note and vitals reviewed. Constitutional: He is oriented to person, place, and time. He appears well-developed and  well-nourished. No distress.  HENT:  Head: Normocephalic and atraumatic.  Right Ear: External ear normal.  Left Ear: External ear normal.  Mouth/Throat: Oropharynx is clear and moist. No oropharyngeal exudate.  Eyes: Conjunctivae and EOM are normal. Pupils are equal, round, and reactive to light. No scleral icterus.  Neck: Normal range of motion. Neck supple. No spinous process tenderness and no muscular tenderness present. No rigidity. Normal range of motion present.  Cardiovascular: Normal rate, regular rhythm, normal heart sounds and intact distal pulses.   No murmur heard. Capillary refill less than 3 seconds  Pulmonary/Chest: Effort normal and breath sounds normal. No accessory muscle usage. Not tachypneic. No respiratory distress. He has no decreased breath sounds. He has no wheezes. He has no rhonchi. He has no rales. He exhibits no tenderness.  Abdominal: Soft. Normal appearance and bowel sounds are normal. He exhibits no distension  and no mass. There is generalized tenderness (mild). There is no rigidity, no rebound, no guarding and no CVA tenderness.  Obese  Genitourinary: Rectal exam shows no external hemorrhoid, no internal hemorrhoid, no fissure, no mass, no tenderness and anal tone normal. Guaiac negative stool. Prostate is not enlarged and not tender.  No gross blood on rectal exam, prostate nontender non-boggy  Musculoskeletal: Normal range of motion. He exhibits tenderness. He exhibits no edema.       Right ankle: He exhibits normal range of motion, no swelling, no ecchymosis, no deformity, no laceration and normal pulse. Tenderness. Lateral malleolus and medial malleolus tenderness found.       Left ankle: He exhibits swelling. He exhibits normal range of motion, no ecchymosis, no deformity, no laceration and normal pulse. Tenderness. Lateral malleolus and medial malleolus tenderness found.  ROM: Full range of motion of bilateral ankles without pain, patient ambulates without  difficulty  Lymphadenopathy:    He has no cervical adenopathy.  Neurological: He is alert and oriented to person, place, and time. He exhibits normal muscle tone. Coordination normal.  Speech is clear and goal oriented Moves extremities without ataxia  Skin: Skin is warm and dry. No rash noted. He is not diaphoretic. No erythema.  No tenting of the skin  Psychiatric: He has a normal mood and affect.    ED Course  Procedures (including critical care time)  Labs Reviewed  OCCULT BLOOD X 1 CARD TO LAB, STOOL   Dg Chest 2 View  10/08/2012   *RADIOLOGY REPORT*  Clinical Data: Shortness of breath  CHEST - 2 VIEW  Comparison: June 17, 2012.  Findings: Cardiomediastinal silhouette appears normal.  No acute pulmonary disease is noted.  Bony thorax is intact.  IMPRESSION: No acute cardiopulmonary abnormality seen.   Original Report Authenticated By: Marijo Conception.,  M.D.   Dg Ankle Complete Left  10/08/2012   *RADIOLOGY REPORT*  Clinical Data: Left ankle pain after injury  LEFT ANKLE COMPLETE - 3+ VIEW  Comparison: None.  Findings: No fracture or dislocation is noted.  Joint spaces are intact. No soft tissue abnormality is noted.  IMPRESSION: Normal left ankle.   Original Report Authenticated By: Marijo Conception.,  M.D.   Dg Ankle Complete Right  10/08/2012   *RADIOLOGY REPORT*  Clinical Data:  Right ankle pain after injury.  RIGHT ANKLE - COMPLETE 3+ VIEW  Comparison: Oct 17, 2010.  Findings: No fracture or dislocation is noted.  Joint spaces are intact. No soft tissue abnormality is noted.  IMPRESSION: Normal right ankle.   Original Report Authenticated By: Marijo Conception.,  M.D.    ECG:  Date: 10/08/2012  Rate: 72  Rhythm: normal sinus rhythm  QRS Axis: normal  Intervals: normal  ST/T Wave abnormalities: normal  Conduction Disutrbances:none  Narrative Interpretation:   Old EKG Reviewed: unchanged    1. Ankle sprain and strain, left, initial encounter   2. Ankle sprain and strain,  right, initial encounter   3. Abdominal pain, generalized   4. SOB (shortness of breath)       MDM  Ann Lions presents with multiple complaints primarily the pain in his ankles for 2 weeks after falling.  Will obtain x-rays, ECG and hemoccult card for evaluation of complaints.   X-rays of the ankles show no acute abnormality including no fracture or dislocation. Chest x-rays without vascular congestion or evidence of congestive heart failure this time. EKG is nonischemic, unchanged from previous and I highly doubt acute  coronary syndrome. Patient with nonspecific generalized abdominal pain and mild tenderness for several days without nausea or vomiting tolerating by mouth here in the department. Patient guaiac-negative and I'm not suspicious for appendicitis, diverticulitis, cholecystitis, proctatitis or any other acute abdominal etiology at this time.  I personally reviewed the imaging tests through PACS system.  I reviewed available ER/hospitalization records through the EMR.  Will give ankle splint for left ankle as this that is bothering him most.  Recommending close followup with primary care physician for all of these complaints and reevaluation this week.  Dr. Ripley Fraise was consulted, evaluated this patient with me and agrees with the plan.        Jarrett Soho Aarion Metzgar, PA-C 10/08/12 1318

## 2012-10-08 NOTE — ED Notes (Signed)
Dr. Carlene Coria was called in [facility] for Dr. Christy Gentles.

## 2012-10-08 NOTE — ED Provider Notes (Signed)
Medical screening examination/treatment/procedure(s) were conducted as a shared visit with non-physician practitioner(s) and myself.  I personally evaluated the patient during the encounter  Pt here with mother then decided to check in while she was being evaluated He is in no distress, walking around the ED EKG reviewed I spoke to his PCP (I called PCP for his mother) and advised him of patient's visit and can f/u as outpatient   Sharyon Cable, MD 10/08/12 [phone]

## 2012-10-08 NOTE — ED Notes (Signed)
Pt steady gait to triage area-states he stepped in a hole 2 weeks ago-c/o pain to both ankles-most pain to left ankle

## 2012-11-08 ENCOUNTER — Encounter (HOSPITAL_BASED_OUTPATIENT_CLINIC_OR_DEPARTMENT_OTHER): Payer: Self-pay | Admitting: *Deleted

## 2012-11-08 ENCOUNTER — Emergency Department (HOSPITAL_BASED_OUTPATIENT_CLINIC_OR_DEPARTMENT_OTHER): Payer: BC Managed Care – PPO

## 2012-11-08 ENCOUNTER — Emergency Department (HOSPITAL_BASED_OUTPATIENT_CLINIC_OR_DEPARTMENT_OTHER)
Admission: EM | Admit: 2012-11-08 | Discharge: 2012-11-09 | Disposition: A | Discharge status: Home / Self Care | Payer: BC Managed Care – PPO | Attending: Emergency Medicine | Admitting: Emergency Medicine

## 2012-11-08 DIAGNOSIS — R110 Nausea: Secondary | ICD-10-CM | POA: Insufficient documentation

## 2012-11-08 DIAGNOSIS — Z87442 Personal history of urinary calculi: Secondary | ICD-10-CM | POA: Insufficient documentation

## 2012-11-08 DIAGNOSIS — Z794 Long term (current) use of insulin: Secondary | ICD-10-CM | POA: Insufficient documentation

## 2012-11-08 DIAGNOSIS — Z79899 Other long term (current) drug therapy: Secondary | ICD-10-CM | POA: Insufficient documentation

## 2012-11-08 DIAGNOSIS — G4730 Sleep apnea, unspecified: Secondary | ICD-10-CM | POA: Insufficient documentation

## 2012-11-08 DIAGNOSIS — I10 Essential (primary) hypertension: Secondary | ICD-10-CM | POA: Insufficient documentation

## 2012-11-08 DIAGNOSIS — Z8739 Personal history of other diseases of the musculoskeletal system and connective tissue: Secondary | ICD-10-CM | POA: Insufficient documentation

## 2012-11-08 DIAGNOSIS — Z8709 Personal history of other diseases of the respiratory system: Secondary | ICD-10-CM | POA: Insufficient documentation

## 2012-11-08 DIAGNOSIS — Z8719 Personal history of other diseases of the digestive system: Secondary | ICD-10-CM | POA: Insufficient documentation

## 2012-11-08 DIAGNOSIS — M25579 Pain in unspecified ankle and joints of unspecified foot: Secondary | ICD-10-CM | POA: Insufficient documentation

## 2012-11-08 DIAGNOSIS — Z7982 Long term (current) use of aspirin: Secondary | ICD-10-CM | POA: Insufficient documentation

## 2012-11-08 DIAGNOSIS — I509 Heart failure, unspecified: Secondary | ICD-10-CM | POA: Insufficient documentation

## 2012-11-08 DIAGNOSIS — IMO0002 Reserved for concepts with insufficient information to code with codable children: Secondary | ICD-10-CM | POA: Insufficient documentation

## 2012-11-08 DIAGNOSIS — E119 Type 2 diabetes mellitus without complications: Secondary | ICD-10-CM | POA: Insufficient documentation

## 2012-11-08 LAB — COMPREHENSIVE METABOLIC PANEL
ALT: 25 U/L (ref 0–53)
AST: 18 U/L (ref 0–37)
Albumin: 4.1 g/dL (ref 3.5–5.2)
Alkaline Phosphatase: 53 U/L (ref 39–117)
Chloride: 101 mEq/L (ref 96–112)
Potassium: 3.7 mEq/L (ref 3.5–5.1)
Sodium: 138 mEq/L (ref 135–145)
Total Protein: 7.3 g/dL (ref 6.0–8.3)

## 2012-11-08 LAB — URINALYSIS, ROUTINE W REFLEX MICROSCOPIC
Bilirubin Urine: NEGATIVE
Leukocytes, UA: NEGATIVE
Nitrite: NEGATIVE
Specific Gravity, Urine: 1.018 (ref 1.005–1.030)
Urobilinogen, UA: 1 mg/dL (ref 0.0–1.0)
pH: 8 (ref 5.0–8.0)

## 2012-11-08 LAB — CBC WITH DIFFERENTIAL/PLATELET
Basophils Relative: 1 % (ref 0–1)
Eosinophils Absolute: 0.1 10*3/uL (ref 0.0–0.7)
MCH: 30.4 pg (ref 26.0–34.0)
MCHC: 35.1 g/dL (ref 30.0–36.0)
Neutro Abs: 2.3 10*3/uL (ref 1.7–7.7)
Neutrophils Relative %: 56 % (ref 43–77)
Platelets: 192 10*3/uL (ref 150–400)
RBC: 4.31 MIL/uL (ref 4.22–5.81)

## 2012-11-08 MED ORDER — ONDANSETRON HCL 4 MG/2ML IJ SOLN
4.0000 mg | Freq: Once | INTRAMUSCULAR | Status: AC
  Administered 2012-11-08: 4 mg via INTRAVENOUS
  Filled 2012-11-08: qty 2

## 2012-11-08 MED ORDER — MORPHINE SULFATE 4 MG/ML IJ SOLN
4.0000 mg | Freq: Once | INTRAMUSCULAR | Status: AC
  Administered 2012-11-08: 4 mg via INTRAVENOUS
  Filled 2012-11-08: qty 1

## 2012-11-08 MED ORDER — SODIUM CHLORIDE 0.9 % IV BOLUS (SEPSIS)
1000.0000 mL | Freq: Once | INTRAVENOUS | Status: AC
  Administered 2012-11-08: 1000 mL via INTRAVENOUS

## 2012-11-08 NOTE — ED Notes (Signed)
Fever 102 while at work tonight.  Nausea, weakness and right lower quadrant pain.

## 2012-11-08 NOTE — ED Notes (Signed)
MD at bedside. 

## 2012-11-08 NOTE — ED Provider Notes (Addendum)
History     CSN: [id number]  Arrival date & time 11/08/12  2221   First MD Initiated Contact with Patient 11/08/12 2301      Chief Complaint  Patient presents with  . Abdominal Pain    (Consider location/radiation/quality/duration/timing/severity/associated sxs/prior treatment) Patient is a 52 y.o. male presenting with abdominal pain. The history is provided by the patient.  Abdominal Pain This is a new problem. The current episode started 3 to 5 hours ago. The problem occurs constantly. The problem has not changed since onset.Associated symptoms include abdominal pain. Pertinent negatives include no headaches and no shortness of breath. Nothing aggravates the symptoms. Nothing relieves the symptoms. He has tried nothing for the symptoms. The treatment provided no relief.  Also complaining of ongoing ankle pain Bilaterally and a myriad of other things.  No tick exposure.    Past Medical History  Diagnosis Date  . Diabetes mellitus   . Hypertension   . Pancreatitis   . Kidney stone   . Sleep apnea   . CHF (congestive heart failure)   . Pneumonia   . Cirrhosis   . Neck pain     Past Surgical History  Procedure Laterality Date  . Cholecystectomy    . Multiple cysts removal-hip,wrist      No family history on file.  History  Substance Use Topics  . Smoking status: Never Smoker   . Smokeless tobacco: Not on file  . Alcohol Use: No      Review of Systems  Respiratory: Negative for shortness of breath.   Gastrointestinal: Positive for nausea and abdominal pain. Negative for vomiting, diarrhea and constipation.  Genitourinary: Negative for dysuria.  Neurological: Negative for headaches.  All other systems reviewed and are negative.    Allergies  Codeine  Home Medications   Current Outpatient Rx  Name  Route  Sig  Dispense  Refill  . albuterol (PROVENTIL HFA;VENTOLIN HFA) 108 (90 BASE) MCG/ACT inhaler   Inhalation   Inhale 2 puffs into the lungs every 6  (six) hours as needed. For shortness of breath          . aspirin 81 MG tablet   Oral   Take 81 mg by mouth daily.           . calcium & magnesium carbonates (MYLANTA) 409-811 MG per tablet   Oral   Take 2 tablets by mouth daily as needed. For indigestion          . carisoprodol (SOMA) 350 MG tablet   Oral   Take 350 mg by mouth 3 (three) times daily as needed. For pain           . ciprofloxacin (CIPRO) 500 MG tablet   Oral   Take 1 tablet (500 mg total) by mouth every 12 (twelve) hours.   14 tablet   0   . citalopram (CELEXA) 40 MG tablet   Oral   Take 40 mg by mouth daily.           . cyanocobalamin (,VITAMIN B-12,) 1000 MCG/ML injection   Intramuscular   Inject 1,000 mcg into the muscle once a week.          . desloratadine (CLARINEX) 5 MG tablet   Oral   Take 1 tablet (5 mg total) by mouth daily.   7 tablet   0   . diphenhydrAMINE (BENADRYL) 25 MG tablet   Oral   Take 25 mg by mouth daily as needed. For allergies          .  diphenoxylate-atropine (LOMOTIL) 2.5-0.025 MG per tablet   Oral   Take 1 tablet by mouth 4 (four) times daily as needed for diarrhea or loose stools.   30 tablet   0   . fluticasone (FLONASE) 50 MCG/ACT nasal spray   Nasal   Place 2 sprays into the nose daily.   16 g   0   . furosemide (LASIX) 40 MG tablet   Oral   Take 40 mg by mouth 2 (two) times daily.           Marland Kitchen glimepiride (AMARYL) 4 MG tablet   Oral   Take 4 mg by mouth 2 (two) times daily.           . Glucosamine 500 MG TABS   Oral   Take 500 mg by mouth 2 (two) times daily.           Marland Kitchen HYDROcodone-acetaminophen (NORCO) 10-325 MG per tablet   Oral   Take 1 tablet by mouth every 4 (four) hours as needed for pain.   30 tablet   0   . insulin glargine (LANTUS SOLOSTAR) 100 UNIT/ML injection   Subcutaneous   Inject 80 Units into the skin 2 (two) times daily.           . Liraglutide (VICTOZA) 18 MG/3ML SOLN   Subcutaneous   Inject into the  skin daily.          Marland Kitchen lisinopril (PRINIVIL,ZESTRIL) 40 MG tablet   Oral   Take 40 mg by mouth 2 (two) times daily.          Marland Kitchen loperamide (IMODIUM) 2 MG capsule   Oral   Take 2 mg by mouth 4 (four) times daily as needed.         . loratadine (CLARITIN) 10 MG tablet   Oral   Take 10 mg by mouth daily.           Marland Kitchen LORazepam (ATIVAN) 1 MG tablet   Oral   Take 1 mg by mouth 2 (two) times daily.           . ondansetron (ZOFRAN ODT) 4 MG disintegrating tablet   Oral   Take 1 tablet (4 mg total) by mouth every 8 (eight) hours as needed for nausea.   20 tablet   0   . permethrin (ELIMITE) 5 % cream      Apply to affected area once   60 g   0   . Potassium Gluconate 595 MG CAPS   Oral   Take 595 mg by mouth once.         . pregabalin (LYRICA) 75 MG capsule   Oral   Take 75 mg by mouth 2 (two) times daily.         . pseudoephedrine (SUDAFED) 30 MG tablet   Oral   Take 30 mg by mouth every 4 (four) hours as needed.         . tiotropium (SPIRIVA) 18 MCG inhalation capsule   Inhalation   Place 18 mcg into inhaler and inhale daily as needed. For shortness of breath         . traMADol (ULTRAM) 50 MG tablet   Oral   Take 50 mg by mouth 3 (three) times daily as needed. For pain. Maximum dose= 8 tablets per day         . Vitamin D, Ergocalciferol, (DRISDOL) 50000 UNITS CAPS   Oral   Take 50,000 Units by mouth once a  week. For 12 weeks           BP 132/63  Pulse 94  Temp(Src) 99.7 F (37.6 C) (Oral)  Resp 24  Wt 282 lb (127.914 kg)  BMI 40.46 kg/m2  SpO2 99%  Physical Exam  Constitutional: He is oriented to person, place, and time. He appears well-developed and well-nourished. No distress.  HENT:  Head: Normocephalic and atraumatic.  Mouth/Throat: Oropharynx is clear and moist.  Eyes: Conjunctivae are normal. Pupils are equal, round, and reactive to light.  Neck: Normal range of motion. Neck supple.  Cardiovascular: Normal rate, regular  rhythm and intact distal pulses.   Pulmonary/Chest: Effort normal and breath sounds normal. He has no wheezes. He has no rales.  Abdominal: Soft. Bowel sounds are normal. He exhibits no distension and no mass. There is tenderness. There is no rebound and no guarding.  Mild suprapubic  Musculoskeletal: Normal range of motion.  Neurological: He is alert and oriented to person, place, and time.  Skin: Skin is warm and dry.  Psychiatric: He has a normal mood and affect.    ED Course  Procedures (including critical care time)  Labs Reviewed  URINALYSIS, ROUTINE W REFLEX MICROSCOPIC - Abnormal; Notable for the following:    APPearance CLOUDY (*)    Glucose, UA 100 (*)    All other components within normal limits  CBC WITH DIFFERENTIAL  COMPREHENSIVE METABOLIC PANEL  TROPONIN I   No results found.   No diagnosis found.    MDM   Date: 11/08/2012  Rate: 91  Rhythm: normal sinus rhythm  QRS Axis: normal  Intervals: normal  ST/T Wave abnormalities: normal  Conduction Disutrbances: none  Narrative Interpretation: unremarkable     No concern for obstruction and given lack of fever in the ED patient is safe for discharge. Urine negative chest Xray negative.  Continue your hydrocodone for pain and follow up with your family doctor.   Patient continues to have concerns about ankles follow up with your family doctor for ongoing care.        Carlisle Beers, MD 11/09/12 0119  Bethany Hirt K Bonnita Newby-Rasch, MD 11/09/12 [phone]

## 2012-11-09 MED ORDER — IOHEXOL 300 MG/ML  SOLN
50.0000 mL | Freq: Once | INTRAMUSCULAR | Status: AC | PRN
  Administered 2012-11-09: 50 mL via ORAL

## 2012-11-09 MED ORDER — IOHEXOL 300 MG/ML  SOLN
100.0000 mL | Freq: Once | INTRAMUSCULAR | Status: AC | PRN
  Administered 2012-11-09: 100 mL via INTRAVENOUS

## 2012-11-09 MED ORDER — TRAMADOL HCL 50 MG PO TABS: 50.0000 mg | ORAL_TABLET | Freq: Four times a day (QID) | ORAL | Status: DC | PRN

## 2012-11-09 NOTE — ED Notes (Signed)
MD at bedside. 

## 2012-11-19 ENCOUNTER — Emergency Department (HOSPITAL_BASED_OUTPATIENT_CLINIC_OR_DEPARTMENT_OTHER): Payer: BC Managed Care – PPO

## 2012-11-19 ENCOUNTER — Inpatient Hospital Stay (HOSPITAL_BASED_OUTPATIENT_CLINIC_OR_DEPARTMENT_OTHER)
Admission: EM | Admit: 2012-11-19 | Discharge: 2012-11-21 | DRG: 419 | Disposition: A | Discharge status: Home / Self Care | Payer: BC Managed Care – PPO | Attending: Internal Medicine | Admitting: Internal Medicine

## 2012-11-19 ENCOUNTER — Encounter (HOSPITAL_BASED_OUTPATIENT_CLINIC_OR_DEPARTMENT_OTHER): Payer: Self-pay

## 2012-11-19 DIAGNOSIS — R079 Chest pain, unspecified: Secondary | ICD-10-CM

## 2012-11-19 DIAGNOSIS — R112 Nausea with vomiting, unspecified: Secondary | ICD-10-CM

## 2012-11-19 DIAGNOSIS — A770 Spotted fever due to Rickettsia rickettsii: Secondary | ICD-10-CM

## 2012-11-19 DIAGNOSIS — I10 Essential (primary) hypertension: Secondary | ICD-10-CM | POA: Diagnosis present

## 2012-11-19 DIAGNOSIS — K7581 Nonalcoholic steatohepatitis (NASH): Secondary | ICD-10-CM | POA: Diagnosis present

## 2012-11-19 DIAGNOSIS — Z79899 Other long term (current) drug therapy: Secondary | ICD-10-CM

## 2012-11-19 DIAGNOSIS — D72819 Decreased white blood cell count, unspecified: Secondary | ICD-10-CM | POA: Diagnosis present

## 2012-11-19 DIAGNOSIS — I509 Heart failure, unspecified: Secondary | ICD-10-CM | POA: Diagnosis present

## 2012-11-19 DIAGNOSIS — G4733 Obstructive sleep apnea (adult) (pediatric): Secondary | ICD-10-CM | POA: Diagnosis present

## 2012-11-19 DIAGNOSIS — Z794 Long term (current) use of insulin: Secondary | ICD-10-CM

## 2012-11-19 DIAGNOSIS — Z7982 Long term (current) use of aspirin: Secondary | ICD-10-CM

## 2012-11-19 DIAGNOSIS — E119 Type 2 diabetes mellitus without complications: Secondary | ICD-10-CM

## 2012-11-19 DIAGNOSIS — R109 Unspecified abdominal pain: Secondary | ICD-10-CM | POA: Diagnosis present

## 2012-11-19 DIAGNOSIS — K7460 Unspecified cirrhosis of liver: Secondary | ICD-10-CM | POA: Diagnosis not present

## 2012-11-19 DIAGNOSIS — R509 Fever, unspecified: Principal | ICD-10-CM | POA: Diagnosis present

## 2012-11-19 LAB — CBC WITH DIFFERENTIAL/PLATELET
Basophils Relative: 1 % (ref 0–1)
Eosinophils Absolute: 0 10*3/uL (ref 0.0–0.7)
Eosinophils Relative: 1 % (ref 0–5)
HCT: 37.6 % — ABNORMAL LOW (ref 39.0–52.0)
Hemoglobin: 13.1 g/dL (ref 13.0–17.0)
MCH: 30 pg (ref 26.0–34.0)
MCHC: 34.8 g/dL (ref 30.0–36.0)
MCV: 86 fL (ref 78.0–100.0)
Monocytes Absolute: 0.3 10*3/uL (ref 0.1–1.0)
Monocytes Relative: 9 % (ref 3–12)
Neutro Abs: 2.5 10*3/uL (ref 1.7–7.7)

## 2012-11-19 LAB — URINALYSIS, ROUTINE W REFLEX MICROSCOPIC
Glucose, UA: NEGATIVE mg/dL
Hgb urine dipstick: NEGATIVE
Ketones, ur: NEGATIVE mg/dL
Leukocytes, UA: NEGATIVE
Protein, ur: NEGATIVE mg/dL

## 2012-11-19 LAB — TROPONIN I
Troponin I: <0.3 ng/mL (ref <0.30)
Troponin I: <0.3 ng/mL (ref <0.30)

## 2012-11-19 LAB — PRO B NATRIURETIC PEPTIDE: Pro B Natriuretic peptide (BNP): 57.8 pg/mL (ref 0–125)

## 2012-11-19 LAB — COMPREHENSIVE METABOLIC PANEL
Albumin: 4 g/dL (ref 3.5–5.2)
BUN: 9 mg/dL (ref 6–23)
Calcium: 9.4 mg/dL (ref 8.4–10.5)
Chloride: 97 mEq/L (ref 96–112)
Creatinine, Ser: 0.8 mg/dL (ref 0.50–1.35)
Total Bilirubin: 0.4 mg/dL (ref 0.3–1.2)

## 2012-11-19 MED ORDER — ALUM & MAG HYDROXIDE-SIMETH 200-200-20 MG/5ML PO SUSP: 15.0000 mL | Freq: Four times a day (QID) | ORAL | Status: DC | PRN

## 2012-11-19 MED ORDER — MORPHINE SULFATE 4 MG/ML IJ SOLN
4.0000 mg | Freq: Once | INTRAMUSCULAR | Status: AC
  Administered 2012-11-19: 4 mg via INTRAVENOUS
  Filled 2012-11-19: qty 1

## 2012-11-19 MED ORDER — ONDANSETRON HCL 4 MG/2ML IJ SOLN
4.0000 mg | Freq: Once | INTRAMUSCULAR | Status: AC
  Administered 2012-11-19: 4 mg via INTRAVENOUS
  Filled 2012-11-19: qty 2

## 2012-11-19 MED ORDER — SODIUM CHLORIDE 0.9 % IV SOLN
INTRAVENOUS | Status: AC
  Administered 2012-11-19: 21:00:00 via INTRAVENOUS

## 2012-11-19 MED ORDER — IOHEXOL 350 MG/ML SOLN
100.0000 mL | Freq: Once | INTRAVENOUS | Status: AC | PRN
  Administered 2012-11-19: 100 mL via INTRAVENOUS

## 2012-11-19 MED ORDER — ASPIRIN 81 MG PO CHEW
324.0000 mg | CHEWABLE_TABLET | Freq: Once | ORAL | Status: AC
  Administered 2012-11-19: 324 mg via ORAL
  Filled 2012-11-19: qty 4

## 2012-11-19 MED ORDER — SODIUM CHLORIDE 0.9 % IV BOLUS (SEPSIS)
1000.0000 mL | Freq: Once | INTRAVENOUS | Status: AC
  Administered 2012-11-19: 1000 mL via INTRAVENOUS

## 2012-11-19 MED ORDER — GI COCKTAIL ~~LOC~~
30.0000 mL | Freq: Three times a day (TID) | ORAL | Status: DC | PRN
  Administered 2012-11-19: 30 mL via ORAL
  Filled 2012-11-19: qty 30

## 2012-11-19 MED ORDER — ONDANSETRON HCL 4 MG/2ML IJ SOLN: 4.0000 mg | Freq: Once | INTRAMUSCULAR | Status: AC

## 2012-11-19 MED ORDER — PROMETHAZINE HCL 25 MG/ML IJ SOLN
25.0000 mg | Freq: Once | INTRAMUSCULAR | Status: DC
  Filled 2012-11-19: qty 1

## 2012-11-19 MED ORDER — IOHEXOL 300 MG/ML  SOLN
50.0000 mL | Freq: Once | INTRAMUSCULAR | Status: AC | PRN
  Administered 2012-11-19: 50 mL via ORAL

## 2012-11-19 MED ORDER — ONDANSETRON HCL 4 MG/2ML IJ SOLN: 4.0000 mg | Freq: Three times a day (TID) | INTRAMUSCULAR | Status: DC | PRN

## 2012-11-19 MED ORDER — ONDANSETRON HCL 4 MG PO TABS: 4.0000 mg | ORAL_TABLET | Freq: Four times a day (QID) | ORAL | Status: DC

## 2012-11-19 MED ORDER — ACETAMINOPHEN 325 MG PO TABS
650.0000 mg | ORAL_TABLET | Freq: Once | ORAL | Status: AC
  Administered 2012-11-19: 650 mg via ORAL

## 2012-11-19 MED ORDER — ACETAMINOPHEN 325 MG PO TABS
ORAL_TABLET | ORAL | Status: AC
  Filled 2012-11-19: qty 2

## 2012-11-19 MED ORDER — ONDANSETRON HCL 4 MG/2ML IJ SOLN
INTRAMUSCULAR | Status: AC
  Administered 2012-11-19: 4 mg via INTRAVENOUS
  Filled 2012-11-19: qty 2

## 2012-11-19 NOTE — Progress Notes (Signed)
[facility]ed [facility] 52[facility] with HTN, tobacco use  presented to ER with multiple symptoms ABd pain, N/V, second ER visit in 2 weeks Then started complaining of chest pain, substernal with neg EKG and Trop x1 CT abd pelvis and CTA in ER negative When EDP went to DC him started complaining of chest pain and SoB again, hence admission requested  Repeat EKG unchanged Accepted to [facility] team [address]  [phone]

## 2012-11-19 NOTE — ED Notes (Signed)
C/o n/v/fever/SOB,CP x 2-3 weeks

## 2012-11-19 NOTE — ED Notes (Signed)
MD at bedside. 

## 2012-11-19 NOTE — ED Provider Notes (Signed)
History  This chart was scribed for Ezequiel Essex, MD by Elby Beck, ED Scribe. This patient was seen in room MH03/MH03 and the patient's care was started at 3:48 PM.  CSN: [id number]  Arrival date & time 11/19/12  1531    Chief Complaint  Patient presents with  . Emesis    The history is provided by the patient. No language interpreter was used.   HPI Comments: Bryan Becker is a 52 y.o. male with a hx of CHF, DM, pancreatitis, HTN and cirrhosis who presents to the Emergency Department complaining of emesis with multiple associated symptoms of about 3 weeks duration. Pt reports associated nausea, diarrhea, intermittent fever, night sweats and RLQ abdominal pain which have been waking pt from sleep for 3 weeks. Pt reports a Tmax of 103F. Pt reports multiple episodes of diarrhea, without blood, today. Pt states that his last episode of emesis was 1 hour ago. Pt also complains of SOB, which exceeds his baseline SOB due to COPD and asthma, with associated mild, non-radiating, center chest pain onset this week. Pt reports pain his upper back, between his shoulder blades which he states is unusual for him. Pt states that he has a hx of pancreatitis. Pt states that he was seen in the ED 1-2 weeks ago, with the same symptoms, which he states were undiagnosed and have not improved. Pt recently went camping, but denies knowledge of tick bites. Pt states that he called his PCP this week, who prescribed anti-inflammatory drugs in addition to his pain medicines, for suspected diverticulosis. Pt states that he has a hx of CHF but denies hx of stent placement. Pt denies hx of MI. Pt states that he fell about 1 month ago at the [place] and injured his ankles, which have improved partially. Pt states that he has taken Tylenol with some relief of symptoms. Pt denies hematuria, dysuria, testicle pain or swelling, rash or any other symptoms. Pt denies smoking and alcohol use.  PCP- Dr. York Ram   Past  Medical History  Diagnosis Date  . Diabetes mellitus   . Hypertension   . Pancreatitis   . Kidney stone   . Sleep apnea   . CHF (congestive heart failure)   . Pneumonia   . Cirrhosis   . Neck pain    Past Surgical History  Procedure Laterality Date  . Cholecystectomy    . Multiple cysts removal-hip,wrist     No family history on file. History  Substance Use Topics  . Smoking status: Never Smoker   . Smokeless tobacco: Not on file  . Alcohol Use: No    Review of Systems  Constitutional: Positive for fever.  Respiratory: Positive for shortness of breath.   Cardiovascular: Positive for chest pain.  Gastrointestinal: Positive for nausea, vomiting, abdominal pain and diarrhea. Negative for blood in stool.  Genitourinary: Negative for dysuria, hematuria, scrotal swelling and testicular pain.  Musculoskeletal: Positive for back pain.  A complete 10 system review of systems was obtained and all systems are negative except as noted in the HPI and PMH.   Allergies  Codeine  Home Medications   Current Outpatient Rx  Name  Route  Sig  Dispense  Refill  . albuterol (PROVENTIL HFA;VENTOLIN HFA) 108 (90 BASE) MCG/ACT inhaler   Inhalation   Inhale 2 puffs into the lungs every 6 (six) hours as needed. For shortness of breath          . aspirin 81 MG tablet   Oral  Take 81 mg by mouth daily.           . calcium & magnesium carbonates (MYLANTA) 384-536 MG per tablet   Oral   Take 2 tablets by mouth daily as needed. For indigestion          . carisoprodol (SOMA) 350 MG tablet   Oral   Take 350 mg by mouth 3 (three) times daily as needed. For pain           . ciprofloxacin (CIPRO) 500 MG tablet   Oral   Take 1 tablet (500 mg total) by mouth every 12 (twelve) hours.   14 tablet   0   . citalopram (CELEXA) 40 MG tablet   Oral   Take 40 mg by mouth daily.           . cyanocobalamin (,VITAMIN B-12,) 1000 MCG/ML injection   Intramuscular   Inject 1,000 mcg into  the muscle once a week.          . desloratadine (CLARINEX) 5 MG tablet   Oral   Take 1 tablet (5 mg total) by mouth daily.   7 tablet   0   . diphenhydrAMINE (BENADRYL) 25 MG tablet   Oral   Take 25 mg by mouth daily as needed. For allergies          . diphenoxylate-atropine (LOMOTIL) 2.5-0.025 MG per tablet   Oral   Take 1 tablet by mouth 4 (four) times daily as needed for diarrhea or loose stools.   30 tablet   0   . fluticasone (FLONASE) 50 MCG/ACT nasal spray   Nasal   Place 2 sprays into the nose daily.   16 g   0   . furosemide (LASIX) 40 MG tablet   Oral   Take 40 mg by mouth 2 (two) times daily.           Marland Kitchen glimepiride (AMARYL) 4 MG tablet   Oral   Take 4 mg by mouth 2 (two) times daily.           . Glucosamine 500 MG TABS   Oral   Take 500 mg by mouth 2 (two) times daily.           Marland Kitchen HYDROcodone-acetaminophen (NORCO) 10-325 MG per tablet   Oral   Take 1 tablet by mouth every 4 (four) hours as needed for pain.   30 tablet   0   . insulin glargine (LANTUS SOLOSTAR) 100 UNIT/ML injection   Subcutaneous   Inject 80 Units into the skin 2 (two) times daily.           . Liraglutide (VICTOZA) 18 MG/3ML SOLN   Subcutaneous   Inject into the skin daily.          Marland Kitchen lisinopril (PRINIVIL,ZESTRIL) 40 MG tablet   Oral   Take 40 mg by mouth 2 (two) times daily.          Marland Kitchen loperamide (IMODIUM) 2 MG capsule   Oral   Take 2 mg by mouth 4 (four) times daily as needed.         . loratadine (CLARITIN) 10 MG tablet   Oral   Take 10 mg by mouth daily.           Marland Kitchen LORazepam (ATIVAN) 1 MG tablet   Oral   Take 1 mg by mouth 2 (two) times daily.           . ondansetron (ZOFRAN ODT) 4  MG disintegrating tablet   Oral   Take 1 tablet (4 mg total) by mouth every 8 (eight) hours as needed for nausea.   20 tablet   0   . permethrin (ELIMITE) 5 % cream      Apply to affected area once   60 g   0   . Potassium Gluconate 595 MG CAPS   Oral    Take 595 mg by mouth once.         . pregabalin (LYRICA) 75 MG capsule   Oral   Take 75 mg by mouth 2 (two) times daily.         . pseudoephedrine (SUDAFED) 30 MG tablet   Oral   Take 30 mg by mouth every 4 (four) hours as needed.         . tiotropium (SPIRIVA) 18 MCG inhalation capsule   Inhalation   Place 18 mcg into inhaler and inhale daily as needed. For shortness of breath         . traMADol (ULTRAM) 50 MG tablet   Oral   Take 50 mg by mouth 3 (three) times daily as needed. For pain. Maximum dose= 8 tablets per day         . traMADol (ULTRAM) 50 MG tablet   Oral   Take 1 tablet (50 mg total) by mouth every 6 (six) hours as needed for pain.   9 tablet   0   . Vitamin D, Ergocalciferol, (DRISDOL) 50000 UNITS CAPS   Oral   Take 50,000 Units by mouth once a week. For 12 weeks          Triage Vitals: BP 118/64  Pulse 104  Temp(Src) 99.3 F (37.4 C) (Oral)  Resp 24  Ht 5' 10"  (1.778 m)  Wt 280 lb (127.007 kg)  BMI 40.18 kg/m2  SpO2 98%  Physical Exam  Nursing note and vitals reviewed. Constitutional: He is oriented to person, place, and time. He appears well-developed and well-nourished. No distress.  Appears anxious.  HENT:  Head: Normocephalic and atraumatic.  Eyes: EOM are normal.  Neck: Neck supple. No tracheal deviation present.  Cardiovascular: Normal rate.   Pulmonary/Chest: Effort normal. No respiratory distress.  Abdomen obese. RLQ and suprapubic tenderness without guarding or rebound.  Genitourinary:  Testicles non-tender. No CVA tenderness.  Musculoskeletal: Normal range of motion.  Neurological: He is alert and oriented to person, place, and time.  Skin: Skin is warm and dry.  Psychiatric: He has a normal mood and affect. His behavior is normal.    ED Course  Procedures (including critical care time)  DIAGNOSTIC STUDIES: Oxygen Saturation is 98% on RA, normal by my interpretation.    COORDINATION OF CARE: 3:52 PM- Pt advised of  plan receive fluids and treatment of his nausea, along with diagnostic lab work and radiology and pt agrees. 6:27 PM- Recheck with pt and pt advised of normal lab and radiology findings, indicating no need for hospital admission, surgery or further emergency intervention. Pt advised to follow-up with his PCP and pt agrees. Pt requests antibiotic and advised that an antibiotic is unwarranted in the context of his normal findings. Pt is still nauseous and is agreeable to additional Zofran. 7:19 PM- Recheck with pt and pt complains of moderate-to-severe chest pain that radiates to his back, which has been present all day, but has suddenly worsened in the last 15 minutes. Pt also states that he is still nauseous, despite Zofran. Option to be admitted to the  hospital discussed with pt and pt want to be admitted to [facility].   Medications  0.9 %  sodium chloride infusion ( Intravenous New Bag/Given 11/19/12 2058)  ondansetron (ZOFRAN) injection 4 mg (not administered)  ondansetron (ZOFRAN) injection 4 mg (4 mg Intravenous Given 11/19/12 1622)  iohexol (OMNIPAQUE) 300 MG/ML solution 50 mL (50 mLs Oral Contrast Given 11/19/12 1635)  iohexol (OMNIPAQUE) 350 MG/ML injection 100 mL (100 mLs Intravenous Contrast Given 11/19/12 1716)  sodium chloride 0.9 % bolus 1,000 mL (0 mLs Intravenous Stopped 11/19/12 2058)  ondansetron (ZOFRAN) injection 4 mg (4 mg Intravenous Given 11/19/12 1842)  morphine 4 MG/ML injection 4 mg (4 mg Intravenous Given 11/19/12 1942)  aspirin chewable tablet 324 mg (324 mg Oral Given 11/19/12 1942)   Labs Reviewed  CBC WITH DIFFERENTIAL - Abnormal; Notable for the following:    WBC 3.5 (*)    HCT 37.6 (*)    Lymphs Abs 0.6 (*)    All other components within normal limits  COMPREHENSIVE METABOLIC PANEL - Abnormal; Notable for the following:    Sodium 134 (*)    Glucose, Bld 135 (*)    All other components within normal limits  D-DIMER, QUANTITATIVE - Abnormal; Notable for the following:     D-Dimer, Quant 0.97 (*)    All other components within normal limits  URINALYSIS, ROUTINE W REFLEX MICROSCOPIC - Abnormal; Notable for the following:    pH 8.5 (*)    Urobilinogen, UA 2.0 (*)    All other components within normal limits  RAPID STREP SCREEN  CULTURE, GROUP A STREP  TROPONIN I  PRO B NATRIURETIC PEPTIDE  LIPASE, BLOOD  TROPONIN I  CG4 I-STAT (LACTIC ACID)   Ct Angio Chest Pe W/cm &/or Wo Cm  11/19/2012   *RADIOLOGY REPORT*  Clinical Data:  Chest pain, CHF, elevated D-dimer.  Right lower quadrant pain, vomiting, pancreatitis, cirrhosis, prior cholecystectomy.  CT ANGIOGRAPHY CHEST CT ABDOMEN AND PELVIS WITH CONTRAST  Technique:  Multidetector CT imaging of the chest was performed using the standard protocol during bolus administration of intravenous contrast.  Multiplanar CT image reconstructions including MIPs were obtained to evaluate the vascular anatomy. Multidetector CT imaging of the abdomen and pelvis was performed using the standard protocol during bolus administration of intravenous contrast.  Contrast: 100 ml Omnipaque-300 IV  Comparison:  CT abdomen pelvis dated 11/09/2012.  CTA CHEST  Findings:  No evidence of pulmonary embolism.  Mild dependent atelectasis in the lingula and bilateral lower lobes.  Lungs are otherwise essentially clear.  No suspicious pulmonary nodules.  No pleural effusion or pneumothorax.  Visualized thyroid is unremarkable.  The heart is normal in size.  No pericardial effusion.  Age advanced coronary atherosclerosis.  Mild atherosclerotic calcifications of the aortic arch.  No suspicious mediastinal, hilar, or axillary lymphadenopathy.  Review of the MIP images confirms the above findings.  IMPRESSION: No evidence of pulmonary embolism.  No evidence of acute cardiopulmonary disease.  Age advanced coronary atherosclerosis.  CT ABDOMEN AND PELVIS  Findings: Liver, spleen, pancreas, and adrenal glands are within normal limits.  Status post cholecystectomy.   No intrahepatic or extrahepatic ductal dilatation.  Kidneys are within normal limits.  No hydronephrosis.  No evidence of bowel obstruction.  Normal appendix.  Atherosclerotic calcifications of the abdominal aorta and branch vessels.  No abdominopelvic ascites.  10 mm short-axis jejunal mesenteric lymph node (series 9/image 37), likely reactive.  Additional small retroperitoneal lymph nodes which do not meet pathologic CT size criteria.  Pancreas  is unremarkable.  Bladder is within normal limits.  Degenerative changes of the visualized thoracolumbar spine.  Review of the MIP images confirms the above findings.  IMPRESSION: Normal appendix.  No evidence of bowel obstruction.  No interval change from recent CT.   Original Report Authenticated By: Julian Hy, M.D.   Ct Abdomen Pelvis W Contrast  11/19/2012   *RADIOLOGY REPORT*  Clinical Data:  Chest pain, CHF, elevated D-dimer.  Right lower quadrant pain, vomiting, pancreatitis, cirrhosis, prior cholecystectomy.  CT ANGIOGRAPHY CHEST CT ABDOMEN AND PELVIS WITH CONTRAST  Technique:  Multidetector CT imaging of the chest was performed using the standard protocol during bolus administration of intravenous contrast.  Multiplanar CT image reconstructions including MIPs were obtained to evaluate the vascular anatomy. Multidetector CT imaging of the abdomen and pelvis was performed using the standard protocol during bolus administration of intravenous contrast.  Contrast: 100 ml Omnipaque-300 IV  Comparison:  CT abdomen pelvis dated 11/09/2012.  CTA CHEST  Findings:  No evidence of pulmonary embolism.  Mild dependent atelectasis in the lingula and bilateral lower lobes.  Lungs are otherwise essentially clear.  No suspicious pulmonary nodules.  No pleural effusion or pneumothorax.  Visualized thyroid is unremarkable.  The heart is normal in size.  No pericardial effusion.  Age advanced coronary atherosclerosis.  Mild atherosclerotic calcifications of the aortic arch.   No suspicious mediastinal, hilar, or axillary lymphadenopathy.  Review of the MIP images confirms the above findings.  IMPRESSION: No evidence of pulmonary embolism.  No evidence of acute cardiopulmonary disease.  Age advanced coronary atherosclerosis.  CT ABDOMEN AND PELVIS  Findings: Liver, spleen, pancreas, and adrenal glands are within normal limits.  Status post cholecystectomy.  No intrahepatic or extrahepatic ductal dilatation.  Kidneys are within normal limits.  No hydronephrosis.  No evidence of bowel obstruction.  Normal appendix.  Atherosclerotic calcifications of the abdominal aorta and branch vessels.  No abdominopelvic ascites.  10 mm short-axis jejunal mesenteric lymph node (series 9/image 37), likely reactive.  Additional small retroperitoneal lymph nodes which do not meet pathologic CT size criteria.  Pancreas is unremarkable.  Bladder is within normal limits.  Degenerative changes of the visualized thoracolumbar spine.  Review of the MIP images confirms the above findings.  IMPRESSION: Normal appendix.  No evidence of bowel obstruction.  No interval change from recent CT.   Original Report Authenticated By: Julian Hy, M.D.    1. Nausea and vomiting   2. Abdominal pain   3. Chest pain     MDM  2 week history of bodyaches, fever, abdominal pain, nausea, and vomiting.  Seen in ED June 19 with similar symptoms.  Nontoxic appearing. Lungs clear. No wheezing or rales. Abdomen is soft with tenderness to palpation the lower quadrants.  Labs are unremarkable. Negative UA. CT scan shows no evidence of appendicitis or other abdominal pathology. CT chest is negative for PE.  Patient continues to complain of nausea and was tolerating by mouth. No vomiting in ED. Explained the patient that PCP followup as needed. Life-threatening etiologies appear to have been ruled out.  On attempted discharge patient began complaining of left-sided chest pain again that radiates to his back  associated with nausea and shortness of breath. Repeat EKG is unchanged. States his last negative stress test was last year. Given risk factors, ongoing pain, nausea, will admit for cardiac rule out. No beds at [facility].  Agreeable to [facility]    Date: 11/19/2012  Rate: 98  Rhythm: normal sinus rhythm  QRS  Axis: normal  Intervals: normal  ST/T Wave abnormalities: normal  Conduction Disutrbances:none  Narrative Interpretation:   Old EKG Reviewed: unchanged    Date: 11/19/2012  Rate: 99  Rhythm: normal sinus rhythm  QRS Axis: normal  Intervals: normal  ST/T Wave abnormalities: normal  Conduction Disutrbances:none  Narrative Interpretation:   Old EKG Reviewed: unchanged    I personally performed the services described in this documentation, which was scribed in my presence. The recorded information has been reviewed and is accurate.                  Ezequiel Essex, MD 11/20/12 [phone]

## 2012-11-19 NOTE — ED Notes (Signed)
[facility] here for transport at this time.

## 2012-11-20 DIAGNOSIS — I10 Essential (primary) hypertension: Secondary | ICD-10-CM | POA: Diagnosis present

## 2012-11-20 DIAGNOSIS — R509 Fever, unspecified: Secondary | ICD-10-CM | POA: Diagnosis present

## 2012-11-20 DIAGNOSIS — K7581 Nonalcoholic steatohepatitis (NASH): Secondary | ICD-10-CM | POA: Diagnosis present

## 2012-11-20 LAB — COMPREHENSIVE METABOLIC PANEL
Albumin: 3.5 g/dL (ref 3.5–5.2)
BUN: 10 mg/dL (ref 6–23)
Chloride: 103 mEq/L (ref 96–112)
Creatinine, Ser: 0.71 mg/dL (ref 0.50–1.35)
GFR calc Af Amer: >90 mL/min (ref >90)
GFR calc non Af Amer: >90 mL/min (ref >90)
Glucose, Bld: 211 mg/dL — ABNORMAL HIGH (ref 70–99)
Total Bilirubin: 0.3 mg/dL (ref 0.3–1.2)

## 2012-11-20 LAB — CBC
HCT: 36.2 % — ABNORMAL LOW (ref 39.0–52.0)
Hemoglobin: 12.5 g/dL — ABNORMAL LOW (ref 13.0–17.0)
MCV: 85.6 fL (ref 78.0–100.0)
RDW: 13.1 % (ref 11.5–15.5)
WBC: 2.4 10*3/uL — ABNORMAL LOW (ref 4.0–10.5)

## 2012-11-20 LAB — GLUCOSE, CAPILLARY
Glucose-Capillary: 188 mg/dL — ABNORMAL HIGH (ref 70–99)
Glucose-Capillary: 218 mg/dL — ABNORMAL HIGH (ref 70–99)
Glucose-Capillary: 81 mg/dL (ref 70–99)

## 2012-11-20 LAB — TROPONIN I: Troponin I: <0.3 ng/mL (ref <0.30)

## 2012-11-20 MED ORDER — SODIUM CHLORIDE 0.9 % IV SOLN: INTRAVENOUS | Status: DC

## 2012-11-20 MED ORDER — ALBUTEROL SULFATE HFA 108 (90 BASE) MCG/ACT IN AERS: 2.0000 | INHALATION_SPRAY | Freq: Four times a day (QID) | RESPIRATORY_TRACT | Status: DC | PRN

## 2012-11-20 MED ORDER — DOXYCYCLINE HYCLATE 100 MG PO TABS
100.0000 mg | ORAL_TABLET | Freq: Two times a day (BID) | ORAL | Status: DC
  Administered 2012-11-20 – 2012-11-21 (×3): 100 mg via ORAL
  Filled 2012-11-20 (×4): qty 1

## 2012-11-20 MED ORDER — LISINOPRIL 40 MG PO TABS
40.0000 mg | ORAL_TABLET | Freq: Every day | ORAL | Status: DC
  Administered 2012-11-20 – 2012-11-21 (×2): 40 mg via ORAL
  Filled 2012-11-20 (×2): qty 1

## 2012-11-20 MED ORDER — INSULIN GLARGINE 100 UNIT/ML ~~LOC~~ SOLN
60.0000 [IU] | Freq: Two times a day (BID) | SUBCUTANEOUS | Status: DC
  Administered 2012-11-20 (×2): 60 [IU] via SUBCUTANEOUS
  Filled 2012-11-20 (×3): qty 0.6

## 2012-11-20 MED ORDER — INSULIN ASPART 100 UNIT/ML ~~LOC~~ SOLN
0.0000 [IU] | Freq: Three times a day (TID) | SUBCUTANEOUS | Status: DC
  Administered 2012-11-20: 3 [IU] via SUBCUTANEOUS
  Administered 2012-11-20: 2 [IU] via SUBCUTANEOUS
  Administered 2012-11-21: 3 [IU] via SUBCUTANEOUS
  Administered 2012-11-21: 1 [IU] via SUBCUTANEOUS
  Administered 2012-11-21: 2 [IU] via SUBCUTANEOUS

## 2012-11-20 MED ORDER — ACETAMINOPHEN 325 MG PO TABS: 650.0000 mg | ORAL_TABLET | Freq: Four times a day (QID) | ORAL | Status: DC | PRN

## 2012-11-20 MED ORDER — INSULIN GLARGINE 100 UNIT/ML ~~LOC~~ SOLN
40.0000 [IU] | Freq: Every day | SUBCUTANEOUS | Status: DC
  Administered 2012-11-21: 40 [IU] via SUBCUTANEOUS
  Filled 2012-11-20: qty 0.4

## 2012-11-20 MED ORDER — LORATADINE 10 MG PO TABS
10.0000 mg | ORAL_TABLET | Freq: Every day | ORAL | Status: DC
  Administered 2012-11-20 – 2012-11-21 (×2): 10 mg via ORAL
  Filled 2012-11-20 (×2): qty 1

## 2012-11-20 MED ORDER — PREGABALIN 50 MG PO CAPS
75.0000 mg | ORAL_CAPSULE | Freq: Two times a day (BID) | ORAL | Status: DC
  Administered 2012-11-20 – 2012-11-21 (×4): 75 mg via ORAL
  Filled 2012-11-20 (×4): qty 1

## 2012-11-20 MED ORDER — ONDANSETRON HCL 4 MG/2ML IJ SOLN: 4.0000 mg | Freq: Four times a day (QID) | INTRAMUSCULAR | Status: DC | PRN

## 2012-11-20 MED ORDER — ASPIRIN EC 81 MG PO TBEC
81.0000 mg | DELAYED_RELEASE_TABLET | Freq: Every day | ORAL | Status: DC
  Administered 2012-11-20 – 2012-11-21 (×2): 81 mg via ORAL
  Filled 2012-11-20 (×2): qty 1

## 2012-11-20 MED ORDER — PANTOPRAZOLE SODIUM 40 MG PO TBEC
40.0000 mg | DELAYED_RELEASE_TABLET | Freq: Every day | ORAL | Status: DC
  Administered 2012-11-20 – 2012-11-21 (×2): 40 mg via ORAL
  Filled 2012-11-20 (×2): qty 1

## 2012-11-20 MED ORDER — CITALOPRAM HYDROBROMIDE 40 MG PO TABS
40.0000 mg | ORAL_TABLET | Freq: Every day | ORAL | Status: DC
  Administered 2012-11-20 – 2012-11-21 (×2): 40 mg via ORAL
  Filled 2012-11-20 (×2): qty 1

## 2012-11-20 MED ORDER — HYDROCODONE-ACETAMINOPHEN 10-325 MG PO TABS
1.0000 | ORAL_TABLET | ORAL | Status: DC | PRN
  Administered 2012-11-20 – 2012-11-21 (×4): 1 via ORAL
  Filled 2012-11-20 (×6): qty 1

## 2012-11-20 MED ORDER — LORAZEPAM 1 MG PO TABS: 1.0000 mg | ORAL_TABLET | Freq: Two times a day (BID) | ORAL | Status: DC | PRN

## 2012-11-20 MED ORDER — ENOXAPARIN SODIUM 40 MG/0.4ML ~~LOC~~ SOLN
40.0000 mg | Freq: Every day | SUBCUTANEOUS | Status: DC
  Administered 2012-11-20 – 2012-11-21 (×2): 40 mg via SUBCUTANEOUS
  Filled 2012-11-20 (×2): qty 0.4

## 2012-11-20 MED ORDER — TRAMADOL HCL 50 MG PO TABS
50.0000 mg | ORAL_TABLET | Freq: Three times a day (TID) | ORAL | Status: DC | PRN
  Administered 2012-11-20 – 2012-11-21 (×3): 50 mg via ORAL
  Filled 2012-11-20 (×3): qty 1

## 2012-11-20 NOTE — Progress Notes (Signed)
Inpatient Diabetes Program Recommendations  AACE/ADA: New Consensus Statement on Inpatient Glycemic Control (2013)  Target Ranges:  Prepandial:   less than 140 mg/dL      Peak postprandial:   less than 180 mg/dL (1-2 hours)      Critically ill patients:  140 - 180 mg/dL   Reason for Visit: Spoke to patient regarding home diabetes regimen.  He states that he was taken off Lantus insulin 6 months ago and put on Victoza 18 mg/daily.  Currently patient is on Lantus 60 units bid in the hospital.  May consider reducing Lantus to 40 units once daily (starting 11/21/12).  Called and discussed with Dr. Carles Collet.

## 2012-11-20 NOTE — Progress Notes (Signed)
[facility] PROGRESS NOTE  Juniel Groene EHM:[id number] DOB: [birth year] DOA: 11/19/2012 PCP: Harvie Junior, MD  HPI 52 year old gentleman with a history of diabetes mellitus, hypertension,OSA, chronic back pain, NASH presents with three-week history of intermittent fevers, nausea, vomiting, abdominal pain. In the past week, he states that his fevers have been daily up to 102-103F. He denies any new medications. He states that he has been more constipated; therefore, he used some laxative yesterday causing him to have some loose stools. His abdominal pain was somewhat improved after the bowel movements. He denies any hematemesis, hematochezia, melena. He denies any chest discomfort, coughing, hemoptysis. He does complain of shortness of breath with exertion without any worsening leg edema, orthopnea, or PND. He denies any new rashes, synovitis, dysuria, hematuria, penile drainage.  The patient has not had any recent travels outside of [place] within the past year. However, he has a Personal assistant in [place], [place] where he went campling about 1-2wks ago. He denies eating any unusual or undercooked foods. He has not had any sick contacts. He has adult but no other exotic pets. Interestingly, about a year ago, the patient did have many animals including cows, goats, [place], rabbits, but he has since given the animals away 9 months ago. Patient works as a Consulting civil engineer. He denies any previous history of sexually transmitted diseases. His attendance of her partners in his lifetime, all male, and denies any illegal drug use.  Since admission, CT abdomen and pelvis was negative except for a 10 mm jejunal-mesenteric lymph node. CT image of the chest was negative for pulmonary embolus. There was bibasilar atelectasis without any other infiltrates or effusions. Hepatic panel was negative, and lipase was 29. ESR 25, LDH 216, urinalysis was negative. Blood cultures are pending. Lactic acid  0.88.  Assessment/Plan: FUO -The patient has progressive leukopenia and platelets are gradually dropping -Check RMSF serology -check ehrlichia serology -no epidemiologic evidence to suggest Lyme -Followup blood cultures -Start patient on empiric doxycycline -HIV Nausea/vomiting/abdominal pain -Vomiting has improved -CT abdomen negative except for jejunal mesenteric lymph node -Abdominal pain has improved with bowel movement yesterday -No further diarrhea -Continue IV fluids Diabetes mellitus type 2 -Patient is on Victoza at home -Discussed with diabetic coordinator-->start pt on lantus 40u q AM -check A1C -NovoLog sliding scale Hypertension -Continue lisinopril Chronic pain -Continue home Norco dose -continue lyrica Atypical chest pain -Cardiac enzymes negative -EKG reassuring -Improved with GI cocktail   Family Communication:   Pt at beside Disposition Plan:   Home when medically stable  Antibiotics:  Doxycycline 11/20/12>>>         Procedures/Studies: Ct Angio Chest Pe W/cm &/or Wo Cm  11/19/2012   *RADIOLOGY REPORT*  Clinical Data:  Chest pain, CHF, elevated D-dimer.  Right lower quadrant pain, vomiting, pancreatitis, cirrhosis, prior cholecystectomy.  CT ANGIOGRAPHY CHEST CT ABDOMEN AND PELVIS WITH CONTRAST  Technique:  Multidetector CT imaging of the chest was performed using the standard protocol during bolus administration of intravenous contrast.  Multiplanar CT image reconstructions including MIPs were obtained to evaluate the vascular anatomy. Multidetector CT imaging of the abdomen and pelvis was performed using the standard protocol during bolus administration of intravenous contrast.  Contrast: 100 ml Omnipaque-300 IV  Comparison:  CT abdomen pelvis dated 11/09/2012.  CTA CHEST  Findings:  No evidence of pulmonary embolism.  Mild dependent atelectasis in the lingula and bilateral lower lobes.  Lungs are otherwise essentially clear.  No suspicious pulmonary  nodules.  No pleural effusion or pneumothorax.  Visualized thyroid is unremarkable.  The heart is normal in size.  No pericardial effusion.  Age advanced coronary atherosclerosis.  Mild atherosclerotic calcifications of the aortic arch.  No suspicious mediastinal, hilar, or axillary lymphadenopathy.  Review of the MIP images confirms the above findings.  IMPRESSION: No evidence of pulmonary embolism.  No evidence of acute cardiopulmonary disease.  Age advanced coronary atherosclerosis.  CT ABDOMEN AND PELVIS  Findings: Liver, spleen, pancreas, and adrenal glands are within normal limits.  Status post cholecystectomy.  No intrahepatic or extrahepatic ductal dilatation.  Kidneys are within normal limits.  No hydronephrosis.  No evidence of bowel obstruction.  Normal appendix.  Atherosclerotic calcifications of the abdominal aorta and branch vessels.  No abdominopelvic ascites.  10 mm short-axis jejunal mesenteric lymph node (series 9/image 37), likely reactive.  Additional small retroperitoneal lymph nodes which do not meet pathologic CT size criteria.  Pancreas is unremarkable.  Bladder is within normal limits.  Degenerative changes of the visualized thoracolumbar spine.  Review of the MIP images confirms the above findings.  IMPRESSION: Normal appendix.  No evidence of bowel obstruction.  No interval change from recent CT.   Original Report Authenticated By: Julian Hy, M.D.   Ct Abdomen Pelvis W Contrast  11/19/2012   *RADIOLOGY REPORT*  Clinical Data:  Chest pain, CHF, elevated D-dimer.  Right lower quadrant pain, vomiting, pancreatitis, cirrhosis, prior cholecystectomy.  CT ANGIOGRAPHY CHEST CT ABDOMEN AND PELVIS WITH CONTRAST  Technique:  Multidetector CT imaging of the chest was performed using the standard protocol during bolus administration of intravenous contrast.  Multiplanar CT image reconstructions including MIPs were obtained to evaluate the vascular anatomy. Multidetector CT imaging of the  abdomen and pelvis was performed using the standard protocol during bolus administration of intravenous contrast.  Contrast: 100 ml Omnipaque-300 IV  Comparison:  CT abdomen pelvis dated 11/09/2012.  CTA CHEST  Findings:  No evidence of pulmonary embolism.  Mild dependent atelectasis in the lingula and bilateral lower lobes.  Lungs are otherwise essentially clear.  No suspicious pulmonary nodules.  No pleural effusion or pneumothorax.  Visualized thyroid is unremarkable.  The heart is normal in size.  No pericardial effusion.  Age advanced coronary atherosclerosis.  Mild atherosclerotic calcifications of the aortic arch.  No suspicious mediastinal, hilar, or axillary lymphadenopathy.  Review of the MIP images confirms the above findings.  IMPRESSION: No evidence of pulmonary embolism.  No evidence of acute cardiopulmonary disease.  Age advanced coronary atherosclerosis.  CT ABDOMEN AND PELVIS  Findings: Liver, spleen, pancreas, and adrenal glands are within normal limits.  Status post cholecystectomy.  No intrahepatic or extrahepatic ductal dilatation.  Kidneys are within normal limits.  No hydronephrosis.  No evidence of bowel obstruction.  Normal appendix.  Atherosclerotic calcifications of the abdominal aorta and branch vessels.  No abdominopelvic ascites.  10 mm short-axis jejunal mesenteric lymph node (series 9/image 37), likely reactive.  Additional small retroperitoneal lymph nodes which do not meet pathologic CT size criteria.  Pancreas is unremarkable.  Bladder is within normal limits.  Degenerative changes of the visualized thoracolumbar spine.  Review of the MIP images confirms the above findings.  IMPRESSION: Normal appendix.  No evidence of bowel obstruction.  No interval change from recent CT.   Original Report Authenticated By: Julian Hy, M.D.   Ct Abdomen Pelvis W Contrast  11/09/2012   *RADIOLOGY REPORT*  Clinical Data: Abdominal pain.  Right lower quadrant pain.  Fever.  CT ABDOMEN AND  PELVIS WITH CONTRAST  Technique:  Multidetector CT imaging of the abdomen and pelvis was performed following the standard protocol during bolus administration of intravenous contrast.  Contrast: 64m OMNIPAQUE IOHEXOL 300 MG/ML  SOLN, 1043mOMNIPAQUE IOHEXOL 300 MG/ML  SOLN  Comparison: CT of the abdomen and pelvis 07/14/2012.  Findings:  Lung Bases: Linear opacities in the lung bases bilaterally, favored to represent a combination of subsegmental atelectasis and chronic scarring.  Borderline cardiomegaly.  Abdomen/Pelvis:  Status post cholecystectomy.  The appearance of the liver, pancreas, spleen, bilateral adrenal glands and bilateral kidneys is unremarkable.  Mild atherosclerosis throughout the abdominal and pelvic vasculature, without evidence of aneurysm or dissection.  Normal appendix.  No significant volume of ascites. No pneumoperitoneum.  There are there are multiple borderline dilated and mildly dilated loops of small bowel measuring up to 3.3 cm in diameter, however, this is not favored to be related to the bowel obstruction as no definitive transition point is confidently identified.  Mild diffuse haziness in the small bowel mesentery and numerous reactive size lymph nodes throughout the small bowel mesentery, similar to the prior examination.  Tiny umbilical hernia containing only omental fat incidentally noted.  No significant volume of ascites.  No pneumoperitoneum.  No definite pathologic lymphadenopathy identified within the abdomen or pelvis.  Prostate gland and urinary bladder are unremarkable in appearance.  Musculoskeletal: There are no aggressive appearing lytic or blastic lesions noted in the visualized portions of the skeleton.  IMPRESSION: 1.  No acute findings in the abdomen or pelvis to explain the patient's abdominal pain.  Specifically, the appendix is normal. 2.  Unusual appearance of the small bowel with multiple borderline and mildly dilated loops of small bowel.  This is not favored  to be indicative of a small bowel obstruction given the presence of gas and stool in the colon and given the lack of a clear-cut transition point in the small bowel.  If for some reason there is clinical concern for small-bowel obstruction, repeat radiographs of the abdomen could be obtained to ensure progression of administered oral contrast material. 3.  Status post cholecystectomy. 4.  Additional incidental findings, as above.   Original Report Authenticated By: DaVinnie LangtonM.D.   Dg Abd Acute W/chest  11/09/2012   *RADIOLOGY REPORT*  Clinical Data: Right lower quadrant abdominal pain, nausea and vomiting.  ACUTE ABDOMEN SERIES (ABDOMEN 2 VIEW & CHEST 1 VIEW)  Comparison: Chest x-ray 10/08/2012.  CT of the abdomen and pelvis 07/14/2012.  Findings: Ill-defined bibasilar opacities favored to represent areas of subsegmental atelectasis.  No definite acute consolidative airspace disease.  No pleural effusions.  No pneumothorax.  No evidence of pulmonary edema.  Heart size is within normal limits. Mediastinal contours are unremarkable.  Supine and upright views of the abdomen demonstrate gas and stool scattered throughout the colon.  No definite distal rectal gas is identified.  There are a few borderline to mildly dilated loops of gas-filled small bowel throughout the central abdomen measure up to 3.1 cm in diameter, which are nonspecific.  No pneumoperitoneum. Surgical clips project over the right upper quadrant of the abdomen, compatible with prior history of cholecystectomy.  IMPRESSION: 1.  Nonspecific bowel gas pattern, as above.  The possibility of an early or partial small bowel obstruction is difficult to entirely exclude, but is not strongly favored on the basis of today's film. 2.  No pneumoperitoneum. 3.  Status post cholecystectomy. 4.  Ill-defined bibasilar opacities favored to represent subsegmental atelectasis.  However, in this patient with history of vomiting,  sequelae of mild aspiration with  a pneumonitis is not excluded.   Original Report Authenticated By: Vinnie Langton, M.D.         Subjective: Patient states that he is feeling better. His vomiting has improved. His bowel movements have also improved. He feels that this was likely due to taking some laxative yesterday. His abdominal pain has improved after the bowel movements. He denies any current chest pain, shortness breath, headache, visual disturbance, dysuria, hematuria.  Objective: Filed Vitals:   11/19/12 1932 11/19/12 2110 11/19/12 2211 11/20/12 0412  BP: 116/56  132/85 124/69  Pulse: 100  97 97  Temp: 100.4 F (38 C) 102.2 F (39 C) 100.1 F (37.8 C) 98.6 F (37 C)  TempSrc: Oral Oral Oral Oral  Resp: 18  18 20   Height:   5' 10"  (1.778 m)   Weight:   124.694 kg (274 lb 14.4 oz)   SpO2: 100%  98% 98%    Intake/Output Summary (Last 24 hours) at 11/20/12 1103 Last data filed at 11/20/12 0900  Gross per 24 hour  Intake 1112.5 ml  Output   1100 ml  Net   12.5 ml   Weight change:  Exam:   General:  Pt is alert, follows commands appropriately, not in acute distress  HEENT: No icterus, No thrush, [place]/AT  Cardiovascular: RRR, S1/S2, no rubs, no gallops  Respiratory: CTA bilaterally, no wheezing, no crackles, no rhonchi  Abdomen: Soft/+BS, non tender, non distended, no guarding  Extremities: trace edema, No lymphangitis, No petechiae, No rashes, no synovitis  Data Reviewed: Basic Metabolic Panel:  Recent Labs Lab 11/19/12 1621 11/20/12 0805  NA 134* 137  K 3.7 3.6  CL 97 103  CO2 26 26  GLUCOSE 135* 211*  BUN 9 10  CREATININE 0.80 0.71  CALCIUM 9.4 8.5   Liver Function Tests:  Recent Labs Lab 11/19/12 1621 11/20/12 0805  AST 23 19  ALT 27 22  ALKPHOS 49 44  BILITOT 0.4 0.3  PROT 7.5 6.7  ALBUMIN 4.0 3.5    Recent Labs Lab 11/19/12 1621  LIPASE 29   No results found for this basename: AMMONIA,  in the last 168 hours CBC:  Recent Labs Lab 11/19/12 1621  11/20/12 0805  WBC 3.5* 2.4*  NEUTROABS 2.5  --   HGB 13.1 12.5*  HCT 37.6* 36.2*  MCV 86.0 85.6  PLT 197 159   Cardiac Enzymes:  Recent Labs Lab 11/19/12 1621 11/19/12 1924 11/19/12 2351 11/20/12 0805  TROPONINI <0.30 <0.30 <0.30 <0.30   BNP: No components found with this basename: POCBNP,  CBG:  Recent Labs Lab 11/20/12 0156 11/20/12 0812  GLUCAP 188* 218*    Recent Results (from the past 240 hour(s))  RAPID STREP SCREEN     Status: None   Collection Time    11/19/12  7:15 PM      Result Value Range Status   Streptococcus, Group A Screen (Direct) NEGATIVE  NEGATIVE Final   Comment: (NOTE)     A Rapid Antigen test may result negative if the antigen level in the     sample is below the detection level of this test. The FDA has not     cleared this test as a stand-alone test therefore the rapid antigen     negative result has reflexed to a Group A Strep culture.     Scheduled Meds: . aspirin EC  81 mg Oral Daily  . citalopram  40 mg Oral Daily  . enoxaparin (  LOVENOX) injection  40 mg Subcutaneous Daily  . insulin aspart  0-9 Units Subcutaneous TID WC  . insulin glargine  60 Units Subcutaneous BID  . lisinopril  40 mg Oral Daily  . loratadine  10 mg Oral Daily  . pantoprazole  40 mg Oral Q1200  . pregabalin  75 mg Oral BID   Continuous Infusions: . sodium chloride       Mckenzi Buonomo, DO  [facility] Pager [phone]  If 7PM-7AM, please contact night-coverage [email or web address] Password [email or web address] 11/20/2012, 11:03 AM   LOS: 1 day

## 2012-11-20 NOTE — H&P (Signed)
[facility] History and Physical  Athanasios Heldman OAC:[id number] DOB: 23-Jul-1960 DOA: 11/19/2012  Referring physician: EDP PCP: Harvie Junior, MD    Chief Complaint: Nausea vomiting and fever  HPI: Bryan Becker is a 52 y.o. male with past history of diabetes, hypertension, Karlene Lineman, presents to the ER with the above complaint. The patient reports that he has been ill for the last 2-3 weeks with intermittent episodes of nausea, vomiting, abdominal pain,  fevers and sweats, this was happening for a few days and they were followed by 1-2 days of feeling well and then the cycle would repeat. He came to the ER 2 weeks ago had a CT scan which was rather unremarkable and subsequently discharged home. In addition he also reports chronic diarrhea which is unchanged from prior. For the last 4 days his fevers have been higher in the 102 to 103 range with nausea, vomiting, lower abdominal pain and poor by mouth intake and resultant weight loss of 10 pounds in the last 2 weeks. He denies any hematemesis or melena. Upon evaluation the emergency room had unremarkable CT abdomen pelvis, in the interim after vomiting started complaining of chest pain which was improved after GI cocktail, she also ended up getting a CT angiogram of his chest which was negative for PE.    Review of Systems: The patient denies anorexia,, vision loss, decreased hearing, hoarseness, chest pain, syncope, dyspnea on exertion, peripheral edema, balance deficits, hemoptysis,  melena, hematochezia, severe indigestion/heartburn, hematuria, incontinence, genital sores, muscle weakness, suspicious skin lesions, transient blindness, difficulty walking, depression, unusual weight change, abnormal bleeding, enlarged lymph nodes, angioedema, and breast masses.   Past Medical History  Diagnosis Date  . Diabetes mellitus   . Hypertension   . Pancreatitis   . Kidney stone   . Sleep apnea   . CHF (congestive heart failure)   . Pneumonia    . Cirrhosis   . Neck pain    Past Surgical History  Procedure Laterality Date  . Cholecystectomy    . Multiple cysts removal-hip,wrist     Social History:  reports that he has never smoked. He does not have any smokeless tobacco history on file. He reports that he does not drink alcohol or use illicit drugs. 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 Codeine Other (See Comments)    Made skin peel    family history. Significant for heart disease in his father   Prior to Admission medications   Medication Sig Start Date End Date Taking? Authorizing Provider  albuterol (PROVENTIL HFA;VENTOLIN HFA) 108 (90 BASE) MCG/ACT inhaler Inhale 2 puffs into the lungs every 6 (six) hours as needed. For shortness of breath     Historical Provider, MD  aspirin 81 MG tablet Take 81 mg by mouth daily.      Historical Provider, MD  calcium & magnesium carbonates (MYLANTA) 311-232 MG per tablet Take 2 tablets by mouth daily as needed. For indigestion     Historical Provider, MD  carisoprodol (SOMA) 350 MG tablet Take 350 mg by mouth 3 (three) times daily as needed. For pain      Historical Provider, MD  ciprofloxacin (CIPRO) 500 MG tablet Take 1 tablet (500 mg total) by mouth every 12 (twelve) hours. 05/23/12   Fransico Meadow, PA-C  citalopram (CELEXA) 40 MG tablet Take 40 mg by mouth daily.      Historical Provider, MD  cyanocobalamin (,VITAMIN  B-12,) 1000 MCG/ML injection Inject 1,000 mcg into the muscle once a week.     Historical Provider, MD  desloratadine (CLARINEX) 5 MG tablet Take 1 tablet (5 mg total) by mouth daily. 06/17/12   April K Palumbo-Rasch, MD  diphenhydrAMINE (BENADRYL) 25 MG tablet Take 25 mg by mouth daily as needed. For allergies     Historical Provider, MD  diphenoxylate-atropine (LOMOTIL) 2.5-0.025 MG per tablet Take 1 tablet by mouth 4 (four) times daily as needed for diarrhea or loose  stools. 05/23/12   Fransico Meadow, PA-C  fluticasone [facility]) 50 MCG/ACT nasal spray Place 2 sprays into the nose daily. 06/17/12   April K Palumbo-Rasch, MD  furosemide (LASIX) 40 MG tablet Take 40 mg by mouth 2 (two) times daily.      Historical Provider, MD  glimepiride (AMARYL) 4 MG tablet Take 4 mg by mouth 2 (two) times daily.      Historical Provider, MD  Glucosamine 500 MG TABS Take 500 mg by mouth 2 (two) times daily.      Historical Provider, MD  HYDROcodone-acetaminophen (NORCO) 10-325 MG per tablet Take 1 tablet by mouth every 4 (four) hours as needed for pain. 07/15/12   John L Molpus, MD  insulin glargine (LANTUS SOLOSTAR) 100 UNIT/ML injection Inject 80 Units into the skin 2 (two) times daily.      Historical Provider, MD  Liraglutide (VICTOZA) 18 MG/3ML SOLN Inject into the skin daily.     Historical Provider, MD  lisinopril (PRINIVIL,ZESTRIL) 40 MG tablet Take 40 mg by mouth 2 (two) times daily.     Historical Provider, MD  loperamide (IMODIUM) 2 MG capsule Take 2 mg by mouth 4 (four) times daily as needed.    Historical Provider, MD  loratadine (CLARITIN) 10 MG tablet Take 10 mg by mouth daily.      Historical Provider, MD  LORazepam (ATIVAN) 1 MG tablet Take 1 mg by mouth 2 (two) times daily.      Historical Provider, MD  ondansetron (ZOFRAN ODT) 4 MG disintegrating tablet Take 1 tablet (4 mg total) by mouth every 8 (eight) hours as needed for nausea. 05/23/12   Fransico Meadow, PA-C  ondansetron (ZOFRAN) 4 MG tablet Take 1 tablet (4 mg total) by mouth every 6 (six) hours. 11/19/12   Ezequiel Essex, MD  permethrin (ELIMITE) 5 % cream Apply to affected area once 05/23/12   Fransico Meadow, PA-C  Potassium Gluconate 595 MG CAPS Take 595 mg by mouth once.    Historical Provider, MD  pregabalin (LYRICA) 75 MG capsule Take 75 mg by mouth 2 (two) times daily.    Historical Provider, MD  pseudoephedrine (SUDAFED) 30 MG tablet Take 30 mg by mouth every 4 (four) hours as needed.    Historical  Provider, MD  tiotropium (SPIRIVA) 18 MCG inhalation capsule Place 18 mcg into inhaler and inhale daily as needed. For shortness of breath    Historical Provider, MD  traMADol (ULTRAM) 50 MG tablet Take 50 mg by mouth 3 (three) times daily as needed. For pain. Maximum dose= 8 tablets per day    Historical Provider, MD  traMADol (ULTRAM) 50 MG tablet Take 1 tablet (50 mg total) by mouth every 6 (six) hours as needed for pain. 11/09/12   April K Palumbo-Rasch, MD  Vitamin D, Ergocalciferol, (DRISDOL) 50000 UNITS CAPS Take 50,000 Units by mouth once a week. For 12 weeks    Historical Provider, MD   Physical Exam: Filed Vitals:  11/19/12 1542 11/19/12 1932 11/19/12 2110 11/19/12 2211  BP: 118/64 116/56  132/85  Pulse: 104 100  97  Temp: 99.3 F (37.4 C) 100.4 F (38 C) 102.2 F (39 C) 100.1 F (37.8 C)  TempSrc: Oral Oral Oral Oral  Resp: 24 18  18   Height: 5' 10"  (1.778 m)   5' 10"  (1.778 m)  Weight: 127.007 kg (280 lb)   124.694 kg (274 lb 14.4 oz)  SpO2: 98% 100%  98%     General: Alert awake oriented x3 in no acute distress  HEENT: PERRLA, EOMI, enlarged mildly tender submandibular lymphadenopathy  CVS S1-S2 regular rate rhythm no murmurs rubs or gallops  Lungs clear to auscultation bilaterally  Abdomen: Soft distended nontender with no rigidity or rebound, no flank tenderness positive bowel sounds no organomegaly  Extremities no edema clubbing or cyanosis  Neuro nonfocal moves all extremities  Psychiatric appropriate mood and affect  Skin no rashes or skin breakdown    Labs on Admission:  Basic Metabolic Panel:  Recent Labs Lab 11/19/12 1621  NA 134*  K 3.7  CL 97  CO2 26  GLUCOSE 135*  BUN 9  CREATININE 0.80  CALCIUM 9.4   Liver Function Tests:  Recent Labs Lab 11/19/12 1621  AST 23  ALT 27  ALKPHOS 49  BILITOT 0.4  PROT 7.5  ALBUMIN 4.0    Recent Labs Lab 11/19/12 1621  LIPASE 29   No results found for this basename: AMMONIA,  in the  last 168 hours CBC:  Recent Labs Lab 11/19/12 1621  WBC 3.5*  NEUTROABS 2.5  HGB 13.1  HCT 37.6*  MCV 86.0  PLT 197   Cardiac Enzymes:  Recent Labs Lab 11/19/12 1621 11/19/12 1924  TROPONINI <0.30 <0.30    BNP (last 3 results)  Recent Labs  11/19/12 1621  PROBNP 57.8   CBG: No results found for this basename: GLUCAP,  in the last 168 hours  Radiological Exams on Admission: Ct Angio Chest Pe W/cm &/or Wo Cm  11/19/2012   *RADIOLOGY REPORT*  Clinical Data:  Chest pain, CHF, elevated D-dimer.  Right lower quadrant pain, vomiting, pancreatitis, cirrhosis, prior cholecystectomy.  CT ANGIOGRAPHY CHEST CT ABDOMEN AND PELVIS WITH CONTRAST  Technique:  Multidetector CT imaging of the chest was performed using the standard protocol during bolus administration of intravenous contrast.  Multiplanar CT image reconstructions including MIPs were obtained to evaluate the vascular anatomy. Multidetector CT imaging of the abdomen and pelvis was performed using the standard protocol during bolus administration of intravenous contrast.  Contrast: 100 ml Omnipaque-300 IV  Comparison:  CT abdomen pelvis dated 11/09/2012.  CTA CHEST  Findings:  No evidence of pulmonary embolism.  Mild dependent atelectasis in the lingula and bilateral lower lobes.  Lungs are otherwise essentially clear.  No suspicious pulmonary nodules.  No pleural effusion or pneumothorax.  Visualized thyroid is unremarkable.  The heart is normal in size.  No pericardial effusion.  Age advanced coronary atherosclerosis.  Mild atherosclerotic calcifications of the aortic arch.  No suspicious mediastinal, hilar, or axillary lymphadenopathy.  Review of the MIP images confirms the above findings.  IMPRESSION: No evidence of pulmonary embolism.  No evidence of acute cardiopulmonary disease.  Age advanced coronary atherosclerosis.  CT ABDOMEN AND PELVIS  Findings: Liver, spleen, pancreas, and adrenal glands are within normal limits.  Status  post cholecystectomy.  No intrahepatic or extrahepatic ductal dilatation.  Kidneys are within normal limits.  No hydronephrosis.  No evidence of bowel obstruction.  Normal appendix.  Atherosclerotic calcifications of the abdominal aorta and branch vessels.  No abdominopelvic ascites.  10 mm short-axis jejunal mesenteric lymph node (series 9/image 37), likely reactive.  Additional small retroperitoneal lymph nodes which do not meet pathologic CT size criteria.  Pancreas is unremarkable.  Bladder is within normal limits.  Degenerative changes of the visualized thoracolumbar spine.  Review of the MIP images confirms the above findings.  IMPRESSION: Normal appendix.  No evidence of bowel obstruction.  No interval change from recent CT.   Original Report Authenticated By: Julian Hy, M.D.   Ct Abdomen Pelvis W Contrast  11/19/2012   *RADIOLOGY REPORT*  Clinical Data:  Chest pain, CHF, elevated D-dimer.  Right lower quadrant pain, vomiting, pancreatitis, cirrhosis, prior cholecystectomy.  CT ANGIOGRAPHY CHEST CT ABDOMEN AND PELVIS WITH CONTRAST  Technique:  Multidetector CT imaging of the chest was performed using the standard protocol during bolus administration of intravenous contrast.  Multiplanar CT image reconstructions including MIPs were obtained to evaluate the vascular anatomy. Multidetector CT imaging of the abdomen and pelvis was performed using the standard protocol during bolus administration of intravenous contrast.  Contrast: 100 ml Omnipaque-300 IV  Comparison:  CT abdomen pelvis dated 11/09/2012.  CTA CHEST  Findings:  No evidence of pulmonary embolism.  Mild dependent atelectasis in the lingula and bilateral lower lobes.  Lungs are otherwise essentially clear.  No suspicious pulmonary nodules.  No pleural effusion or pneumothorax.  Visualized thyroid is unremarkable.  The heart is normal in size.  No pericardial effusion.  Age advanced coronary atherosclerosis.  Mild atherosclerotic  calcifications of the aortic arch.  No suspicious mediastinal, hilar, or axillary lymphadenopathy.  Review of the MIP images confirms the above findings.  IMPRESSION: No evidence of pulmonary embolism.  No evidence of acute cardiopulmonary disease.  Age advanced coronary atherosclerosis.  CT ABDOMEN AND PELVIS  Findings: Liver, spleen, pancreas, and adrenal glands are within normal limits.  Status post cholecystectomy.  No intrahepatic or extrahepatic ductal dilatation.  Kidneys are within normal limits.  No hydronephrosis.  No evidence of bowel obstruction.  Normal appendix.  Atherosclerotic calcifications of the abdominal aorta and branch vessels.  No abdominopelvic ascites.  10 mm short-axis jejunal mesenteric lymph node (series 9/image 37), likely reactive.  Additional small retroperitoneal lymph nodes which do not meet pathologic CT size criteria.  Pancreas is unremarkable.  Bladder is within normal limits.  Degenerative changes of the visualized thoracolumbar spine.  Review of the MIP images confirms the above findings.  IMPRESSION: Normal appendix.  No evidence of bowel obstruction.  No interval change from recent CT.   Original Report Authenticated By: Julian Hy, M.D.    EKG: Independently reviewed. NSR, no acute ST T wave changes Assessment/Plan Active Problems:   Nausea & vomiting   Fever   Diabetes mellitus   NASH (nonalcoholic steatohepatitis)   HTN (hypertension)    1. Nausea/ Vomiting/Fever -Etiology unclear -CT abdomen pelvis tonight and 2 weeks prior unremarkable except for some reactive lymphadenopathy -Will check GI pathogen panel, including C. difficile PCR -Check ESR, LDH, blood cultures x2 -Check HIV -Supportive care, PPI, IV fluids, her liquid diet -May need GI and or ID evaluation depending on clinical course   2. DM -Continue Lantus at lower dose, sliding scale insulin  3. history of [place] -LFTs stable  4. atypical chest pain: -Likely due to vomiting,  improved with GI cocktail -Add Maalox, PPI, -Also cycle cardiac enzymes  5. history of asthma -Stable continue nebs PRN  Code Status:FULL Family Communication: none at bedside Disposition Plan: inpatient  Time spent: 8mn  [facility] Pager [phone] If 7PM-7AM, please contact night-coverage [email or web address] Password [facility]7/05/2012, 12:56 AM

## 2012-11-20 NOTE — Progress Notes (Signed)
Utilization review complete. Shawnn Bouillon RN CCM Case Mgmt phone [phone] 

## 2012-11-21 DIAGNOSIS — A779 Spotted fever, unspecified: Secondary | ICD-10-CM

## 2012-11-21 DIAGNOSIS — K7689 Other specified diseases of liver: Secondary | ICD-10-CM

## 2012-11-21 LAB — CLOSTRIDIUM DIFFICILE BY PCR: Toxigenic C. Difficile by PCR: NEGATIVE

## 2012-11-21 LAB — COMPREHENSIVE METABOLIC PANEL
ALT: 21 U/L (ref 0–53)
Albumin: 3.1 g/dL — ABNORMAL LOW (ref 3.5–5.2)
Alkaline Phosphatase: 40 U/L (ref 39–117)
BUN: 7 mg/dL (ref 6–23)
Chloride: 105 mEq/L (ref 96–112)
GFR calc Af Amer: >90 mL/min (ref >90)
Glucose, Bld: 128 mg/dL — ABNORMAL HIGH (ref 70–99)
Potassium: 3.6 mEq/L (ref 3.5–5.1)
Sodium: 139 mEq/L (ref 135–145)
Total Bilirubin: 0.3 mg/dL (ref 0.3–1.2)
Total Protein: 6 g/dL (ref 6.0–8.3)

## 2012-11-21 LAB — GI PATHOGEN PANEL BY PCR, STOOL
E coli 0157 by PCR: NEGATIVE
G lamblia by PCR: NEGATIVE
Rotavirus A by PCR: NEGATIVE
Salmonella by PCR: NEGATIVE
Shigella by PCR: NEGATIVE

## 2012-11-21 LAB — CBC WITH DIFFERENTIAL/PLATELET
Hemoglobin: 11.4 g/dL — ABNORMAL LOW (ref 13.0–17.0)
Lymphs Abs: 0.9 10*3/uL (ref 0.7–4.0)
Monocytes Relative: 18 % — ABNORMAL HIGH (ref 3–12)
Neutro Abs: 1.1 10*3/uL — ABNORMAL LOW (ref 1.7–7.7)
Neutrophils Relative %: 43 % (ref 43–77)
Platelets: 150 10*3/uL (ref 150–400)
RBC: 3.84 MIL/uL — ABNORMAL LOW (ref 4.22–5.81)
WBC: 2.5 10*3/uL — ABNORMAL LOW (ref 4.0–10.5)

## 2012-11-21 LAB — ROCKY MTN SPOTTED FVR AB, IGM-BLOOD: RMSF IgM: 0.2 IV (ref 0.00–0.89)

## 2012-11-21 LAB — CULTURE, GROUP A STREP

## 2012-11-21 LAB — GLUCOSE, CAPILLARY: Glucose-Capillary: 140 mg/dL — ABNORMAL HIGH (ref 70–99)

## 2012-11-21 LAB — HIV ANTIBODY (ROUTINE TESTING W REFLEX): HIV: NONREACTIVE

## 2012-11-21 MED ORDER — FUROSEMIDE 40 MG PO TABS
40.0000 mg | ORAL_TABLET | Freq: Two times a day (BID) | ORAL | Status: DC
  Administered 2012-11-21: 40 mg via ORAL
  Filled 2012-11-21 (×3): qty 1

## 2012-11-21 MED ORDER — ONDANSETRON 4 MG PO TBDP: 4.0000 mg | ORAL_TABLET | Freq: Three times a day (TID) | ORAL | Status: DC | PRN

## 2012-11-21 MED ORDER — PANTOPRAZOLE SODIUM 40 MG PO TBEC: 40.0000 mg | DELAYED_RELEASE_TABLET | Freq: Every day | ORAL | Status: DC

## 2012-11-21 MED ORDER — DOXYCYCLINE HYCLATE 100 MG PO TABS: 100.0000 mg | ORAL_TABLET | Freq: Two times a day (BID) | ORAL | Status: DC

## 2012-11-21 NOTE — Discharge Summary (Signed)
Physician Discharge Summary  Bryan Becker IRW:[id number] DOB: May 10, 1961 DOA: 11/19/2012  PCP: Harvie Junior, MD  Admit date: 11/19/2012 Discharge date: 11/21/2012  Time spent: 30 minutes  Recommendations for Outpatient Follow-up:  1. Recurrent nausea; CT of abdomen and pelvis did not show an acute abdomen. Counseled Pt w/ all of his abd surgeries he might be suffering fm Subactue Pancreatis 2. Lyme Dz/RMSF; previous team start patient on prophylactic doxycycline with complete course of antibiotics as an outpatient. RMSF antibodies negative Lyme titer never drawn  Discharge Diagnoses:  Active Problems:   Nausea & vomiting   Fever   Diabetes mellitus   NASH (nonalcoholic steatohepatitis)   HTN (hypertension)   Abdominal pain, unspecified site   Discharge Condition: stable  Diet recommendation: Heart Healthy  [facility] Weights   11/19/12 1542 11/19/12 2211 11/21/12 0650  Weight: 127.007 kg (280 lb) 124.694 kg (274 lb 14.4 oz) 122.38 kg (269 lb 12.8 oz)    History of present illness:  Bryan Becker is a 52 y.o.WM PMHx DM, HTN,Nonalcoholic Steatohepatitis ([place]),multi Abd surgeries, presents to the ER with recurrent complaint of nausea, vomiting, fever, states this is the third time this year that he has had the symptoms last time being in March. The patient reports that he has been ill for the last 2-3 weeks with intermittent episodes of nausea, vomiting, abdominal pain, fevers and sweats, this was happening for a few days and they were followed by 1-2 days of feeling well and then the cycle would repeat.  He came to the ER 2 weeks ago had a CT scan which was rather unremarkable and subsequently discharged home.  In addition he also reports chronic diarrhea which is unchanged from prior.  For the last 4 days his fevers have been higher in the 102 to 103 range with nausea, vomiting, lower abdominal pain and poor by mouth intake and resultant weight loss of 10 pounds in the last 2 weeks.   He denies any hematemesis or melena.  Upon evaluation the emergency room had unremarkable CT abdomen pelvis, in the interim after vomiting started complaining of chest pain which was improved after GI cocktail, she also ended up getting a CT angiogram of his chest which was negative for PE.   Hospital Course:  Admitted 11/19/2012 4 increasing abdominal pain nausea/vomiting and measured fever. Patient obtained CT scan of his abdomen and pelvis which was essentially unremarkable. Pt was Started on prophylax ABx of doxycycline  Discharge Exam: Filed Vitals:   11/20/12 1400 11/20/12 2100 11/21/12 0650 11/21/12 1400  BP: 118/60 136/74 144/82   Pulse: 84 83 92 85  Temp: 98.2 F (36.8 C) 98.8 F (37.1 C) 99.7 F (37.6 C) 97.9 F (36.6 C)  TempSrc: Oral Oral Oral Oral  Resp: 20 18 18 18   Height:      Weight:   122.38 kg (269 lb 12.8 oz)   SpO2: 96% 99% 99% 99%    General: Alert,NAD Cardiovascular: Regular rhythm and rate, negative murmurs rubs gallops, /DPPT pulses are plus plus  Respiratory: CTA Bilat  Discharge Instructions     Medication List    TAKE these medications       ondansetron 4 MG tablet  Commonly known as:  ZOFRAN  Take 1 tablet (4 mg total) by mouth every 6 (six) hours.      ASK your doctor about these medications       albuterol 108 (90 BASE) MCG/ACT inhaler  Commonly known as:  PROVENTIL HFA;VENTOLIN HFA  Inhale 2  puffs into the lungs every 6 (six) hours as needed. For shortness of breath     aspirin 81 MG tablet  Take 81 mg by mouth daily.     calcium & magnesium carbonates 311-232 MG per tablet  Commonly known as:  MYLANTA  Take 2 tablets by mouth daily as needed. For indigestion     carisoprodol 350 MG tablet  Commonly known as:  SOMA  - Take 350 mg by mouth 3 (three) times daily as needed. For pain  -      citalopram 40 MG tablet  Commonly known as:  CELEXA  Take 40 mg by mouth daily.     cyanocobalamin 1000 MCG/ML injection  Commonly  known as:  (VITAMIN B-12)  Inject 1,000 mcg into the muscle once a week.     desloratadine 5 MG tablet  Commonly known as:  CLARINEX  Take 1 tablet (5 mg total) by mouth daily.     diphenoxylate-atropine 2.5-0.025 MG per tablet  Commonly known as:  LOMOTIL  Take 1 tablet by mouth 4 (four) times daily as needed for diarrhea or loose stools.     fluticasone 50 MCG/ACT nasal spray  Commonly known as:  FLONASE  Place 2 sprays into the nose daily.     furosemide 40 MG tablet  Commonly known as:  LASIX  Take 40 mg by mouth 2 (two) times daily.     glimepiride 4 MG tablet  Commonly known as:  AMARYL  Take 4 mg by mouth 2 (two) times daily.     Glucosamine 500 MG Tabs  Take 500 mg by mouth 2 (two) times daily.     HYDROcodone-acetaminophen 10-325 MG per tablet  Commonly known as:  NORCO  Take 1 tablet by mouth every 4 (four) hours as needed for pain.     lisinopril 40 MG tablet  Commonly known as:  PRINIVIL,ZESTRIL  Take 40 mg by mouth 2 (two) times daily.     LORazepam 1 MG tablet  Commonly known as:  ATIVAN  Take 1 mg by mouth 2 (two) times daily.     multivitamin with minerals tablet  Take 2 tablets by mouth daily.     ondansetron 4 MG disintegrating tablet  Commonly known as:  ZOFRAN ODT  Take 1 tablet (4 mg total) by mouth every 8 (eight) hours as needed for nausea.     Potassium Gluconate 595 MG Caps  Take 595 mg by mouth once.     pregabalin 75 MG capsule  Commonly known as:  LYRICA  Take 75 mg by mouth 2 (two) times daily.     pseudoephedrine 30 MG tablet  Commonly known as:  SUDAFED  Take 30 mg by mouth every 4 (four) hours as needed.     tiotropium 18 MCG inhalation capsule  Commonly known as:  SPIRIVA  Place 18 mcg into inhaler and inhale 2 (two) times daily as needed (wheezing).     traMADol 50 MG tablet  Commonly known as:  ULTRAM  Take 50 mg by mouth 3 (three) times daily as needed. For pain. Maximum dose= 8 tablets per day     VICTOZA 18 MG/3ML  Soln injection  Generic drug:  Liraglutide  Inject into the skin daily.     VITAMIN D PO  Take 1 tablet by mouth daily.       Allergies  Allergen Reactions  . Codeine Other (See Comments)    Made skin peel  Follow-up Information   Follow up with Harvie Junior, MD. Schedule an appointment as soon as possible for a visit in 2 days.   Contact information:   [address] [place] [address] [phone]        The results of significant diagnostics from this hospitalization (including imaging, microbiology, ancillary and laboratory) are listed below for reference.    Significant Diagnostic Studies: Ct Angio Chest Pe W/cm &/or Wo Cm  11/19/2012   *RADIOLOGY REPORT*  Clinical Data:  Chest pain, CHF, elevated D-dimer.  Right lower quadrant pain, vomiting, pancreatitis, cirrhosis, prior cholecystectomy.  CT ANGIOGRAPHY CHEST CT ABDOMEN AND PELVIS WITH CONTRAST  Technique:  Multidetector CT imaging of the chest was performed using the standard protocol during bolus administration of intravenous contrast.  Multiplanar CT image reconstructions including MIPs were obtained to evaluate the vascular anatomy. Multidetector CT imaging of the abdomen and pelvis was performed using the standard protocol during bolus administration of intravenous contrast.  Contrast: 100 ml Omnipaque-300 IV  Comparison:  CT abdomen pelvis dated 11/09/2012.  CTA CHEST  Findings:  No evidence of pulmonary embolism.  Mild dependent atelectasis in the lingula and bilateral lower lobes.  Lungs are otherwise essentially clear.  No suspicious pulmonary nodules.  No pleural effusion or pneumothorax.  Visualized thyroid is unremarkable.  The heart is normal in size.  No pericardial effusion.  Age advanced coronary atherosclerosis.  Mild atherosclerotic calcifications of the aortic arch.  No suspicious mediastinal, hilar, or axillary lymphadenopathy.  Review of the MIP images confirms the above findings.   IMPRESSION: No evidence of pulmonary embolism.  No evidence of acute cardiopulmonary disease.  Age advanced coronary atherosclerosis.  CT ABDOMEN AND PELVIS  Findings: Liver, spleen, pancreas, and adrenal glands are within normal limits.  Status post cholecystectomy.  No intrahepatic or extrahepatic ductal dilatation.  Kidneys are within normal limits.  No hydronephrosis.  No evidence of bowel obstruction.  Normal appendix.  Atherosclerotic calcifications of the abdominal aorta and branch vessels.  No abdominopelvic ascites.  10 mm short-axis jejunal mesenteric lymph node (series 9/image 37), likely reactive.  Additional small retroperitoneal lymph nodes which do not meet pathologic CT size criteria.  Pancreas is unremarkable.  Bladder is within normal limits.  Degenerative changes of the visualized thoracolumbar spine.  Review of the MIP images confirms the above findings.  IMPRESSION: Normal appendix.  No evidence of bowel obstruction.  No interval change from recent CT.   Original Report Authenticated By: Julian Hy, M.D.   Ct Abdomen Pelvis W Contrast  11/19/2012   *RADIOLOGY REPORT*  Clinical Data:  Chest pain, CHF, elevated D-dimer.  Right lower quadrant pain, vomiting, pancreatitis, cirrhosis, prior cholecystectomy.  CT ANGIOGRAPHY CHEST CT ABDOMEN AND PELVIS WITH CONTRAST  Technique:  Multidetector CT imaging of the chest was performed using the standard protocol during bolus administration of intravenous contrast.  Multiplanar CT image reconstructions including MIPs were obtained to evaluate the vascular anatomy. Multidetector CT imaging of the abdomen and pelvis was performed using the standard protocol during bolus administration of intravenous contrast.  Contrast: 100 ml Omnipaque-300 IV  Comparison:  CT abdomen pelvis dated 11/09/2012.  CTA CHEST  Findings:  No evidence of pulmonary embolism.  Mild dependent atelectasis in the lingula and bilateral lower lobes.  Lungs are otherwise essentially  clear.  No suspicious pulmonary nodules.  No pleural effusion or pneumothorax.  Visualized thyroid is unremarkable.  The heart is normal in size.  No pericardial effusion.  Age advanced coronary atherosclerosis.  Mild atherosclerotic calcifications of the aortic arch.  No suspicious mediastinal, hilar, or axillary lymphadenopathy.  Review of the MIP images confirms the above findings.  IMPRESSION: No evidence of pulmonary embolism.  No evidence of acute cardiopulmonary disease.  Age advanced coronary atherosclerosis.  CT ABDOMEN AND PELVIS  Findings: Liver, spleen, pancreas, and adrenal glands are within normal limits.  Status post cholecystectomy.  No intrahepatic or extrahepatic ductal dilatation.  Kidneys are within normal limits.  No hydronephrosis.  No evidence of bowel obstruction.  Normal appendix.  Atherosclerotic calcifications of the abdominal aorta and branch vessels.  No abdominopelvic ascites.  10 mm short-axis jejunal mesenteric lymph node (series 9/image 37), likely reactive.  Additional small retroperitoneal lymph nodes which do not meet pathologic CT size criteria.  Pancreas is unremarkable.  Bladder is within normal limits.  Degenerative changes of the visualized thoracolumbar spine.  Review of the MIP images confirms the above findings.  IMPRESSION: Normal appendix.  No evidence of bowel obstruction.  No interval change from recent CT.   Original Report Authenticated By: Julian Hy, M.D.   Ct Abdomen Pelvis W Contrast  11/09/2012   *RADIOLOGY REPORT*  Clinical Data: Abdominal pain.  Right lower quadrant pain.  Fever.  CT ABDOMEN AND PELVIS WITH CONTRAST  Technique:  Multidetector CT imaging of the abdomen and pelvis was performed following the standard protocol during bolus administration of intravenous contrast.  Contrast: 33m OMNIPAQUE IOHEXOL 300 MG/ML  SOLN, 1081mOMNIPAQUE IOHEXOL 300 MG/ML  SOLN  Comparison: CT of the abdomen and pelvis 07/14/2012.  Findings:  Lung Bases: Linear  opacities in the lung bases bilaterally, favored to represent a combination of subsegmental atelectasis and chronic scarring.  Borderline cardiomegaly.  Abdomen/Pelvis:  Status post cholecystectomy.  The appearance of the liver, pancreas, spleen, bilateral adrenal glands and bilateral kidneys is unremarkable.  Mild atherosclerosis throughout the abdominal and pelvic vasculature, without evidence of aneurysm or dissection.  Normal appendix.  No significant volume of ascites. No pneumoperitoneum.  There are there are multiple borderline dilated and mildly dilated loops of small bowel measuring up to 3.3 cm in diameter, however, this is not favored to be related to the bowel obstruction as no definitive transition point is confidently identified.  Mild diffuse haziness in the small bowel mesentery and numerous reactive size lymph nodes throughout the small bowel mesentery, similar to the prior examination.  Tiny umbilical hernia containing only omental fat incidentally noted.  No significant volume of ascites.  No pneumoperitoneum.  No definite pathologic lymphadenopathy identified within the abdomen or pelvis.  Prostate gland and urinary bladder are unremarkable in appearance.  Musculoskeletal: There are no aggressive appearing lytic or blastic lesions noted in the visualized portions of the skeleton.  IMPRESSION: 1.  No acute findings in the abdomen or pelvis to explain the patient's abdominal pain.  Specifically, the appendix is normal. 2.  Unusual appearance of the small bowel with multiple borderline and mildly dilated loops of small bowel.  This is not favored to be indicative of a small bowel obstruction given the presence of gas and stool in the colon and given the lack of a clear-cut transition point in the small bowel.  If for some reason there is clinical concern for small-bowel obstruction, repeat radiographs of the abdomen could be obtained to ensure progression of administered oral contrast material. 3.   Status post cholecystectomy. 4.  Additional incidental findings, as above.   Original Report Authenticated By: DaVinnie LangtonM.D.   Dg Abd Acute W/chest  11/09/2012   *RADIOLOGY REPORT*  Clinical Data: Right lower quadrant abdominal pain, nausea and vomiting.  ACUTE ABDOMEN SERIES (ABDOMEN 2 VIEW & CHEST 1 VIEW)  Comparison: Chest x-ray 10/08/2012.  CT of the abdomen and pelvis 07/14/2012.  Findings: Ill-defined bibasilar opacities favored to represent areas of subsegmental atelectasis.  No definite acute consolidative airspace disease.  No pleural effusions.  No pneumothorax.  No evidence of pulmonary edema.  Heart size is within normal limits. Mediastinal contours are unremarkable.  Supine and upright views of the abdomen demonstrate gas and stool scattered throughout the colon.  No definite distal rectal gas is identified.  There are a few borderline to mildly dilated loops of gas-filled small bowel throughout the central abdomen measure up to 3.1 cm in diameter, which are nonspecific.  No pneumoperitoneum. Surgical clips project over the right upper quadrant of the abdomen, compatible with prior history of cholecystectomy.  IMPRESSION: 1.  Nonspecific bowel gas pattern, as above.  The possibility of an early or partial small bowel obstruction is difficult to entirely exclude, but is not strongly favored on the basis of today's film. 2.  No pneumoperitoneum. 3.  Status post cholecystectomy. 4.  Ill-defined bibasilar opacities favored to represent subsegmental atelectasis.  However, in this patient with history of vomiting, sequelae of mild aspiration with a pneumonitis is not excluded.   Original Report Authenticated By: Vinnie Langton, M.D.    Microbiology: Recent Results (from the past 240 hour(s))  RAPID STREP SCREEN     Status: None   Collection Time    11/19/12  7:15 PM      Result Value Range Status   Streptococcus, Group A Screen (Direct) NEGATIVE  NEGATIVE Final   Comment: (NOTE)     A  Rapid Antigen test may result negative if the antigen level in the     sample is below the detection level of this test. The FDA has not     cleared this test as a stand-alone test therefore the rapid antigen     negative result has reflexed to a Group A Strep culture.  CULTURE, GROUP A STREP     Status: None   Collection Time    11/19/12  7:15 PM      Result Value Range Status   Specimen Description THROAT   Final   Special Requests NONE   Final   Culture No Beta Hemolytic Streptococci Isolated   Final   Report Status 11/21/2012 FINAL   Final  CULTURE, BLOOD (ROUTINE X 2)     Status: None   Collection Time    11/20/12  2:00 AM      Result Value Range Status   Specimen Description BLOOD LEFT HAND   Final   Special Requests BOTTLES DRAWN AEROBIC ONLY 5CC   Final   Culture  Setup Time 11/20/2012 07:57   Final   Culture     Final   Value:        BLOOD CULTURE RECEIVED NO GROWTH TO DATE CULTURE WILL BE HELD FOR 5 DAYS BEFORE ISSUING A FINAL NEGATIVE REPORT   Report Status PENDING   Incomplete  CULTURE, BLOOD (ROUTINE X 2)     Status: None   Collection Time    11/20/12  2:10 AM      Result Value Range Status   Specimen Description BLOOD LEFT ARM   Final   Special Requests BOTTLES DRAWN AEROBIC ONLY 10CC   Final   Culture  Setup Time 11/20/2012 07:57  Final   Culture     Final   Value:        BLOOD CULTURE RECEIVED NO GROWTH TO DATE CULTURE WILL BE HELD FOR 5 DAYS BEFORE ISSUING A FINAL NEGATIVE REPORT   Report Status PENDING   Incomplete  CLOSTRIDIUM DIFFICILE BY PCR     Status: None   Collection Time    11/20/12  6:05 PM      Result Value Range Status   C difficile by pcr NEGATIVE  NEGATIVE Final     Labs: Basic Metabolic Panel:  Recent Labs Lab 11/19/12 1621 11/20/12 0805 11/21/12 0632  NA 134* 137 139  K 3.7 3.6 3.6  CL 97 103 105  CO2 26 26 26   GLUCOSE 135* 211* 128*  BUN 9 10 7   CREATININE 0.80 0.71 0.81  CALCIUM 9.4 8.5 8.2*   Liver Function Tests:  Recent  Labs Lab 11/19/12 1621 11/20/12 0805 11/21/12 0632  AST 23 19 21   ALT 27 22 21   ALKPHOS 49 44 40  BILITOT 0.4 0.3 0.3  PROT 7.5 6.7 6.0  ALBUMIN 4.0 3.5 3.1*    Recent Labs Lab 11/19/12 1621  LIPASE 29   No results found for this basename: AMMONIA,  in the last 168 hours CBC:  Recent Labs Lab 11/19/12 1621 11/20/12 0805 11/21/12 0632  WBC 3.5* 2.4* 2.5*  NEUTROABS 2.5  --  1.1*  HGB 13.1 12.5* 11.4*  HCT 37.6* 36.2* 33.5*  MCV 86.0 85.6 87.2  PLT 197 159 150   Cardiac Enzymes:  Recent Labs Lab 11/19/12 1621 11/19/12 1924 11/19/12 2351 11/20/12 0805 11/20/12 1151  TROPONINI <0.30 <0.30 <0.30 <0.30 <0.30   BNP: BNP (last 3 results)  Recent Labs  11/19/12 1621  PROBNP 57.8   CBG:  Recent Labs Lab 11/20/12 1620 11/20/12 2114 11/21/12 0744 11/21/12 1203 11/21/12 1645  GLUCAP 81 113* 140* 186* 229*       Signed:  Amalya Salmons, J  [facility] 11/21/2012, 6:01 PM

## 2012-11-22 DIAGNOSIS — A770 Spotted fever due to Rickettsia rickettsii: Secondary | ICD-10-CM

## 2012-11-22 HISTORY — DX: Spotted fever due to Rickettsia rickettsii: A77.0

## 2012-11-26 LAB — CULTURE, BLOOD (ROUTINE X 2): Culture: NO GROWTH

## 2012-11-27 LAB — CMV (CYTOMEGALOVIRUS) DNA ULTRAQUANT, PCR

## 2012-11-27 LAB — EHRLICHIA ANTIBODY PANEL

## 2013-01-24 ENCOUNTER — Encounter: Payer: Self-pay | Admitting: Internal Medicine

## 2013-01-29 ENCOUNTER — Ambulatory Visit (AMBULATORY_SURGERY_CENTER): Payer: Self-pay | Admitting: *Deleted

## 2013-01-29 ENCOUNTER — Telehealth: Payer: Self-pay | Admitting: *Deleted

## 2013-01-29 VITALS — Ht 69.0 in | Wt 270.0 lb

## 2013-01-29 DIAGNOSIS — Z1211 Encounter for screening for malignant neoplasm of colon: Secondary | ICD-10-CM

## 2013-01-29 MED ORDER — NA SULFATE-K SULFATE-MG SULF 17.5-3.13-1.6 GM/177ML PO SOLN: 1.0000 | Freq: Once | ORAL | Status: DC

## 2013-01-29 NOTE — Progress Notes (Signed)
Denies allergies to eggs or soy products. Denies complications with sedation or anesthesia.

## 2013-01-29 NOTE — Telephone Encounter (Signed)
[name],  Bryan Becker had a previsit with me today and we reviewed a very long and complicated history. He is scheduled for colonoscopy 02/12/13 and per our brief phone conversation you asked me to keep his appointment. He has some GI history with [facility] and [facility] approximately 20-25 years ago he reports, apparently with some polyps removed at that time. You stated we do not need to obtain records. He has many current complaints regarding abdominal pain on the lower left side, he was hospitalized in June. He states he has had a 50 lb weight loss over the past year, fatigue, night sweats and fever. He states he generally just does not feel well. He has recently been seen over the past two months by the [facility] on [facility] for nausea and vomiting. It is this clinic that has made the referral for colonoscopy. Bryan Becker will be losing his insurance 02/20/13 and he does not want to delay colonoscopy. Does he need an office visit prior to his scheduled colonoscopy? Please advise.  Thank you, Olivia Mackie

## 2013-01-30 ENCOUNTER — Encounter: Payer: Self-pay | Admitting: Internal Medicine

## 2013-01-30 NOTE — Telephone Encounter (Signed)
I do need to see him in the office Talk to [facility] - he might be able to get in tomorrow

## 2013-01-30 NOTE — Telephone Encounter (Signed)
Left message for patient to call back  

## 2013-01-31 ENCOUNTER — Encounter: Payer: Self-pay | Admitting: *Deleted

## 2013-01-31 NOTE — Telephone Encounter (Signed)
Patient scheduled for 02/07/13 8:45

## 2013-02-07 ENCOUNTER — Ambulatory Visit (INDEPENDENT_AMBULATORY_CARE_PROVIDER_SITE_OTHER): Payer: BC Managed Care – PPO | Admitting: Internal Medicine

## 2013-02-07 ENCOUNTER — Telehealth: Payer: Self-pay | Admitting: *Deleted

## 2013-02-07 ENCOUNTER — Encounter: Payer: Self-pay | Admitting: Internal Medicine

## 2013-02-07 ENCOUNTER — Other Ambulatory Visit (INDEPENDENT_AMBULATORY_CARE_PROVIDER_SITE_OTHER): Payer: BC Managed Care – PPO

## 2013-02-07 VITALS — BP 130/72 | HR 92 | Ht 68.0 in | Wt 271.0 lb

## 2013-02-07 DIAGNOSIS — R634 Abnormal weight loss: Secondary | ICD-10-CM

## 2013-02-07 DIAGNOSIS — R1013 Epigastric pain: Secondary | ICD-10-CM

## 2013-02-07 DIAGNOSIS — R1011 Right upper quadrant pain: Secondary | ICD-10-CM

## 2013-02-07 DIAGNOSIS — R112 Nausea with vomiting, unspecified: Secondary | ICD-10-CM

## 2013-02-07 DIAGNOSIS — R195 Other fecal abnormalities: Secondary | ICD-10-CM

## 2013-02-07 LAB — LIPASE: Lipase: 22 U/L (ref 11.0–59.0)

## 2013-02-07 NOTE — Patient Instructions (Addendum)
Your physician has requested that you go to the basement for the following lab work before leaving today: Amylase  Lipase  Starting today please hold your Victoza  Please contact your primary care doctor regarding your insulin/diabetic medication

## 2013-02-07 NOTE — Progress Notes (Signed)
Subjective:    Patient ID: Bryan Becker, male    DOB: February 25, 1961, 52 y.o.   MRN: [id number]  HPI This man is scheduled for a colonoscopy. He has been having 1 year of upper abdominal pain radiating into the back. Getting worse. Admitted in June w/ negative CT scan. Started Victoza 1 year ago. Heving episodic nausea and vmiting also. He has loose stools, too. Says he has lost 50# in past year. When he was in hospital in June had fevers. Not now. He had w/u for CMV, HIV and RMSF w/ serologies - all negative. So was GI pathogen panel Recent course of cipro no help  He may have a hx of colon polyps - had EGD and colonoscopy years ago at [facility] - was losing weight due to use of diet medication and had side effects.  About to lose health insurance - has been unable to maintain work due to illness. Allergies  Allergen Reactions  . Codeine Other (See Comments)    Made skin peel   Outpatient Prescriptions Prior to Visit  Medication Sig Dispense Refill  . albuterol (PROVENTIL HFA;VENTOLIN HFA) 108 (90 BASE) MCG/ACT inhaler Inhale 2 puffs into the lungs every 6 (six) hours as needed. For shortness of breath       . aspirin 81 MG tablet Take 81 mg by mouth daily.        . calcium & magnesium carbonates (MYLANTA) 311-232 MG per tablet Take 2 tablets by mouth daily as needed. For indigestion       . carisoprodol (SOMA) 350 MG tablet Take 350 mg by mouth 3 (three) times daily as needed. For pain        . celecoxib (CELEBREX) 100 MG capsule Take 100 mg by mouth 2 (two) times daily.      . Cholecalciferol (VITAMIN D PO) Take 1 tablet by mouth daily.      . citalopram (CELEXA) 40 MG tablet Take 40 mg by mouth daily.        . cyanocobalamin (,VITAMIN B-12,) 1000 MCG/ML injection Inject 1,000 mcg into the muscle once a week.       . desloratadine (CLARINEX) 5 MG tablet Take 1 tablet (5 mg total) by mouth daily.  7 tablet  0  . diphenoxylate-atropine (LOMOTIL) 2.5-0.025 MG per tablet Take 1 tablet  by mouth 4 (four) times daily as needed for diarrhea or loose stools.  30 tablet  0  . doxycycline (VIBRA-TABS) 100 MG tablet Take 1 tablet (100 mg total) by mouth every 12 (twelve) hours.  28 tablet  0  . fluticasone (FLONASE) 50 MCG/ACT nasal spray Place 2 sprays into the nose daily.  16 g  0  . furosemide (LASIX) 40 MG tablet Take 40 mg by mouth 2 (two) times daily.        Marland Kitchen glimepiride (AMARYL) 4 MG tablet Take 4 mg by mouth 2 (two) times daily.        . Glucosamine 500 MG TABS Take 500 mg by mouth 2 (two) times daily.        Marland Kitchen HYDROcodone-acetaminophen (NORCO) 10-325 MG per tablet Take 1 tablet by mouth every 4 (four) hours as needed for pain.  30 tablet  0  . Liraglutide (VICTOZA) 18 MG/3ML SOLN Inject into the skin daily.       Marland Kitchen lisinopril (PRINIVIL,ZESTRIL) 40 MG tablet Take 40 mg by mouth 2 (two) times daily.       Marland Kitchen LORazepam (ATIVAN) 1 MG tablet Take 1  mg by mouth 2 (two) times daily.        . Multiple Vitamins-Minerals (MULTIVITAMIN WITH MINERALS) tablet Take 2 tablets by mouth daily.      . Na Sulfate-K Sulfate-Mg Sulf SOLN Take 1 kit by mouth once. suprep as directed. No substitutions.  354 mL  0  . ondansetron (ZOFRAN ODT) 4 MG disintegrating tablet Take 1 tablet (4 mg total) by mouth every 8 (eight) hours as needed for nausea.  30 tablet  0  . pantoprazole (PROTONIX) 40 MG tablet Take 1 tablet (40 mg total) by mouth daily at 12 noon.  90 tablet  3  . Potassium Gluconate 595 MG CAPS Take 595 mg by mouth once.      . pregabalin (LYRICA) 75 MG capsule Take 75 mg by mouth 2 (two) times daily.      . pseudoephedrine (SUDAFED) 30 MG tablet Take 30 mg by mouth every 4 (four) hours as needed.      . tiotropium (SPIRIVA) 18 MCG inhalation capsule Place 18 mcg into inhaler and inhale 2 (two) times daily as needed (wheezing).      . traMADol (ULTRAM) 50 MG tablet Take 50 mg by mouth 3 (three) times daily as needed. For pain. Maximum dose= 8 tablets per day       No facility-administered  medications prior to visit.   Past Medical History  Diagnosis Date  . Diabetes mellitus   . Hypertension   . Pancreatitis   . Kidney stone   . CHF (congestive heart failure)   . Pneumonia   . 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 Neck pain   . Seasonal allergies   . Asthma   . Anxiety   . COPD (chronic obstructive pulmonary disease)   . Hyperlipidemia    Past Surgical History  Procedure Laterality Date  . Cholecystectomy    . Multiple cysts removal-hip,wrist    . Upper gastrointestinal endoscopy    . Colonoscopy     History   Social History  . Marital Status: Married    Spouse Name: N/A    Number of Children: 2  . Years of Education: N/A   Occupational History  . unemployed    Social History Main Topics  . Smoking status: Former Smoker    Types: Cigarettes    Quit date: 05/23/2002  . Smokeless tobacco: Never Used  . Alcohol Use: No  . Drug Use: No  .       Family History  Problem Relation Age of Onset  . Colon cancer Neg Hx   . Esophageal cancer Neg Hx   . Rectal cancer Neg Hx   . Skin cancer Father   . Lung cancer Paternal Grandfather   . Stomach cancer Maternal Grandfather   . Bone cancer Paternal Grandmother   . Diabetes Mother   . Diabetes Maternal Grandmother   . Heart disease Maternal Grandmother   . Heart disease Father   . Heart disease Paternal Grandfather   . Heart attack Paternal Grandmother        Review of Systems Unable to maintain job due to illness    Objective:   Physical Exam General:  Well-developed, well-nourished and in no acute distress but obese Eyes:  anicteric. ENT:   Mouth and posterior pharynx free of lesions. Dentition fair, + caries Neck:   supple w/o thyromegaly or mass. thick Lungs: Clear to auscultation bilaterally. Heart:  S1S2, no rubs, murmurs, gallops. Abdomen:  soft, mild-mod  tender epigastrium, no hepatosplenomegaly, hernia, or mass and BS+.  Rectal: deferred Extremities:   no  edema Neuro:  A&O x 3.  Psych:  appropriate mood and  Affect.   Data Reviewed: Hospital records, CT, labs June 2014     Assessment & Plan:  Abdominal pain, epigastric  RUQ pain  Nausea and vomiting  Weight Loss  Loose stools  1. ? abd pain and vomiting  From Victoza - check amylase and lipase and hold medication. Check w/ PCP about DM Tx. Has prior Hx pancreatitis unclear reasons yrs ago says was not GB (s/p chole) 2. ? Gastroparesis - blood sugars 300 in AM 3. Evaluate with EGD - add on before colonoscopy next week. The risks and benefits as well as alternatives of endoscopic procedure(s) have been discussed and reviewed. All questions answered. The patient agrees to proceed. 4. He reports a hx of cirrhosis - labs, PE and CT do not support though he is at risk (NAFLD/NASH)  I appreciate the opportunity to care for this patient. TT:[id number] M, MD

## 2013-02-07 NOTE — Telephone Encounter (Signed)
[name],  Patient requesting pain meds.  He was seen in the office today.  Please advise

## 2013-02-08 ENCOUNTER — Encounter: Payer: Self-pay | Admitting: Internal Medicine

## 2013-02-08 MED ORDER — TRAMADOL HCL 50 MG PO TABS: 50.0000 mg | ORAL_TABLET | Freq: Four times a day (QID) | ORAL | Status: DC | PRN

## 2013-02-08 NOTE — Telephone Encounter (Signed)
Tramadol 50 mg every 6 hrs prn #30

## 2013-02-08 NOTE — Progress Notes (Signed)
Quick Note:  Amylase and lipase normal goes against pancreatitis ______

## 2013-02-08 NOTE — Telephone Encounter (Signed)
Patient notified

## 2013-02-12 ENCOUNTER — Ambulatory Visit (AMBULATORY_SURGERY_CENTER): Payer: BC Managed Care – PPO | Admitting: Internal Medicine

## 2013-02-12 ENCOUNTER — Encounter: Payer: Self-pay | Admitting: Internal Medicine

## 2013-02-12 VITALS — BP 126/66 | HR 65 | Temp 97.6°F | Resp 16 | Ht 68.0 in | Wt 271.0 lb

## 2013-02-12 DIAGNOSIS — R1013 Epigastric pain: Secondary | ICD-10-CM

## 2013-02-12 DIAGNOSIS — R112 Nausea with vomiting, unspecified: Secondary | ICD-10-CM

## 2013-02-12 DIAGNOSIS — K633 Ulcer of intestine: Secondary | ICD-10-CM

## 2013-02-12 DIAGNOSIS — K5289 Other specified noninfective gastroenteritis and colitis: Secondary | ICD-10-CM

## 2013-02-12 DIAGNOSIS — Z1211 Encounter for screening for malignant neoplasm of colon: Secondary | ICD-10-CM

## 2013-02-12 LAB — GLUCOSE, CAPILLARY
Glucose-Capillary: 138 mg/dL — ABNORMAL HIGH (ref 70–99)
Glucose-Capillary: 124 mg/dL — ABNORMAL HIGH (ref 70–99)

## 2013-02-12 MED ORDER — SODIUM CHLORIDE 0.9 % IV SOLN: 500.0000 mL | INTRAVENOUS | Status: DC

## 2013-02-12 NOTE — Op Note (Signed)
[address]  [address]. [place], [address]   COLONOSCOPY PROCEDURE REPORT  PATIENT: Bryan Becker, Bryan Becker  MR#: [id number] BIRTHDATE: [birth year] , 73  yrs. old GENDER: Male ENDOSCOPIST: Gatha Mayer, MD, [facility] PROCEDURE DATE:  02/12/2013 PROCEDURE:   Colonoscopy with biopsy First Screening Colonoscopy - Avg.  risk and is 50 yrs.  old or older Yes.  Prior Negative Screening - Now for repeat screening. N/A  History of Adenoma - Now for follow-up colonoscopy & has been > or = to 3 yrs.  N/A  Polyps Removed Today? No.  Recommend repeat exam, <10 yrs? No. ASA CLASS:   Class III INDICATIONS:average risk screening. MEDICATIONS: propofol (Diprivan) 11m IV, MAC sedation, administered by CRNA, and These medications were titrated to patient response per physician's verbal order  DESCRIPTION OF PROCEDURE:   After the risks benefits and alternatives of the procedure were thoroughly explained, informed consent was obtained.  A digital rectal exam revealed no abnormalities of the rectum, A digital rectal exam revealed the prostate was not enlarged, and A digital rectal exam revealed no prostatic nodules.   The LB [id number] endoscope was introduced through the anus and advanced to the terminal ileum which was intubated for a short distance. No adverse events experienced.   The quality of the prep was excellent using Suprep The instrument was then slowly withdrawn as the colon was fully examined.      COLON FINDINGS: A medium sized ulcer was found at the ileocecal valve.  Multiple biopsies were performed using cold forceps.   The mucosa appeared normal in the terminal ileum.  Multiple random biopsies of the area were performed.   The colon mucosa was otherwise normal.   A right colon retroflexion was performed. Retroflexed views revealed no abnormalities. The time to cecum=1 minutes 28 seconds.  Withdrawal time=8 minutes 50 seconds.  The scope was withdrawn and the  procedure completed. COMPLICATIONS: There were no complications.  ENDOSCOPIC IMPRESSION: 1.   Ulcer at the ileocecal valve; multiple biopsies were performed using cold forceps 2.   Normal mucosa in the terminal ileum; multiple random biopsies of the area were performed 3.   The colon mucosa was otherwise normal  RECOMMENDATIONS: Office will call with the results and plans.   eSigned:  CGatha Mayer MD, [facility]09/23/2014 8:47 AM cc: The Patient and WYork RamMD

## 2013-02-12 NOTE — Progress Notes (Signed)
Lidocaine-40mg IV prior to Propofol InductionPropofol given over incremental dosages 

## 2013-02-12 NOTE — Progress Notes (Signed)
Patient did not have preoperative order for IV antibiotic SSI prophylaxis. (G8918)  Patient did not experience any of the following events: a burn prior to discharge; a fall within the facility; wrong site/side/patient/procedure/implant event; or a hospital transfer or hospital admission upon discharge from the facility. (G8907)  

## 2013-02-12 NOTE — Patient Instructions (Addendum)
The upper endoscopy was normal.  There was an ulcer where the colon meets the small bowel. I took biopsies and will let you know. Please stay off the Victoza.  I appreciate the opportunity to care for you. Gatha Mayer, MD, FACG  YOU HAD AN ENDOSCOPIC PROCEDURE TODAY AT [facility]: Refer to the procedure report that was given to you for any specific questions about what was found during the examination.  If the procedure report does not answer your questions, please call your gastroenterologist to clarify.  If you requested that your care partner not be given the details of your procedure findings, then the procedure report has been included in a sealed envelope for you to review at your convenience later.  YOU SHOULD EXPECT: Some feelings of bloating in the abdomen. Passage of more gas than usual.  Walking can help get rid of the air that was put into your GI tract during the procedure and reduce the bloating. If you had a lower endoscopy (such as a colonoscopy or flexible sigmoidoscopy) you may notice spotting of blood in your stool or on the toilet paper. If you underwent a bowel prep for your procedure, then you may not have a normal bowel movement for a few days.  DIET: Your first meal following the procedure should be a light meal and then it is ok to progress to your normal diet.  A half-sandwich or bowl of soup is an example of a good first meal.  Heavy or fried foods are harder to digest and may make you feel nauseous or bloated.  Likewise meals heavy in dairy and vegetables can cause extra gas to form and this can also increase the bloating.  Drink plenty of fluids but you should avoid alcoholic beverages for 24 hours.  ACTIVITY: Your care partner should take you home directly after the procedure.  You should plan to take it easy, moving slowly for the rest of the day.  You can resume normal activity the day after the procedure however you should NOT DRIVE or use heavy  machinery for 24 hours (because of the sedation medicines used during the test).    SYMPTOMS TO REPORT IMMEDIATELY: A gastroenterologist can be reached at any hour.  During normal business hours, 8:30 AM to 5:00 PM Monday through Friday, call [phone].  After hours and on weekends, please call the GI answering service at [phone] who will take a message and have the physician on call contact you.   Following lower endoscopy (colonoscopy or flexible sigmoidoscopy):  Excessive amounts of blood in the stool  Significant tenderness or worsening of abdominal pains  Swelling of the abdomen that is new, acute  Fever of 100F or higher  Following upper endoscopy (EGD)  Vomiting of blood or coffee ground material  New chest pain or pain under the shoulder blades  Painful or persistently difficult swallowing  New shortness of breath  Fever of 100F or higher  Black, tarry-looking stools  FOLLOW UP: If any biopsies were taken you will be contacted by phone or by letter within the next 1-3 weeks.  Call your gastroenterologist if you have not heard about the biopsies in 3 weeks.  Our staff will call the home number listed on your records the next business day following your procedure to check on you and address any questions or concerns that you may have at that time regarding the information given to you following your procedure. This is a Manufacturing engineer  call and so if there is no answer at the home number and we have not heard from you through the emergency physician on call, we will assume that you have returned to your regular daily activities without incident.  SIGNATURES/CONFIDENTIALITY: You and/or your care partner have signed paperwork which will be entered into your electronic medical record.  These signatures attest to the fact that that the information above on your After Visit Summary has been reviewed and is understood.  Full responsibility of the confidentiality of this discharge  information lies with you and/or your care-partner.  Recommendations Stay off Victoza-I think it may be the culprit Office will call with the results and plan

## 2013-02-12 NOTE — Op Note (Signed)
[address]  [address]. [place], [id number]   ENDOSCOPY PROCEDURE REPORT  PATIENT: Bryan Becker, Bryan Becker  MR#: [id number] BIRTHDATE: 17-Jun-1960 , 59  yrs. old GENDER: Male ENDOSCOPIST: Gatha Mayer, MD, [facility] PROCEDURE DATE:  02/12/2013 PROCEDURE:  EGD, diagnostic ASA CLASS:     Class III INDICATIONS:  Epigastric pain.   Nausea.   Vomiting. MEDICATIONS: propofol (Diprivan) 179m IV, MAC sedation, administered by CRNA, and These medications were titrated to patient response per physician's verbal order TOPICAL ANESTHETIC: Cetacaine Spray  DESCRIPTION OF PROCEDURE: After the risks benefits and alternatives of the procedure were thoroughly explained, informed consent was obtained.  The LB [id number] was introduced through the mouth and advanced to the second portion of the duodenum. Without limitations.  The instrument was slowly withdrawn as the mucosa was fully examined.      The upper, middle and distal third of the esophagus were carefully inspected and no abnormalities were noted.  The z-line was well seen at the GEJ.  The endoscope was pushed into the fundus which was normal including a retroflexed view.  The antrum, gastric body, first and second part of the duodenum were unremarkable. Retroflexed views revealed no abnormalities.     The scope was then withdrawn from the patient and the procedure completed.  COMPLICATIONS: There were no complications. ENDOSCOPIC IMPRESSION: Normal EGD  RECOMMENDATIONS: Stay off Victoza - I think it may be the culprit Colonoscopy next   eSigned:  CGatha Mayer MD, FC[facility]09/23/2014 8:39 AM   CC:The Patient  and DYork Ram MD

## 2013-02-12 NOTE — Progress Notes (Signed)
Called to room to assist during endoscopic procedure.  Patient ID and intended procedure confirmed with present staff. Received instructions for my participation in the procedure from the performing physician.  

## 2013-02-13 ENCOUNTER — Telehealth: Payer: Self-pay | Admitting: *Deleted

## 2013-02-13 NOTE — Telephone Encounter (Signed)
  Follow up Call-  Call back number 02/12/2013  Post procedure Call Back phone  # [phone]  Permission to leave phone message Yes     Patient questions:  Do you have a fever, pain , or abdominal swelling? no Pain Score  0 *  Have you tolerated food without any problems? yes  Have you been able to return to your normal activities? yes  Do you have any questions about your discharge instructions: Diet   no Medications  no Follow up visit  no  Do you have questions or concerns about your Care? no  Actions: * If pain score is 4 or above: No action needed, pain <4.

## 2013-02-19 ENCOUNTER — Telehealth: Payer: Self-pay | Admitting: Internal Medicine

## 2013-02-19 NOTE — Progress Notes (Signed)
Quick Note:  Ulcer from colon is not cancer - probably from NSAID's To call from office and ask how he is off Victoza re: abdominal pain and to get updated hx of GI sxs (any)  [facility] - repeat colon recall 01/2023 ______

## 2013-02-19 NOTE — Telephone Encounter (Signed)
Patient advised that we will call with the plan once the biopsy results have been reviewed by [name]

## 2013-02-21 NOTE — Progress Notes (Signed)
Quick Note:  Suggest Benefiber or generic equivalent 2 tbsp daily and a probiotic like align daily See me Oct/Nov ______

## 2013-03-23 DIAGNOSIS — I639 Cerebral infarction, unspecified: Secondary | ICD-10-CM

## 2013-03-23 HISTORY — DX: Cerebral infarction, unspecified: I63.9

## 2013-03-28 ENCOUNTER — Other Ambulatory Visit: Payer: Self-pay

## 2013-04-15 ENCOUNTER — Emergency Department (HOSPITAL_BASED_OUTPATIENT_CLINIC_OR_DEPARTMENT_OTHER)
Admission: EM | Admit: 2013-04-15 | Discharge: 2013-04-15 | Disposition: A | Discharge status: Home / Self Care | Payer: Self-pay | Attending: Emergency Medicine | Admitting: Emergency Medicine

## 2013-04-15 ENCOUNTER — Encounter (HOSPITAL_BASED_OUTPATIENT_CLINIC_OR_DEPARTMENT_OTHER): Payer: Self-pay | Admitting: Emergency Medicine

## 2013-04-15 ENCOUNTER — Emergency Department (HOSPITAL_BASED_OUTPATIENT_CLINIC_OR_DEPARTMENT_OTHER): Payer: Self-pay

## 2013-04-15 DIAGNOSIS — Z7982 Long term (current) use of aspirin: Secondary | ICD-10-CM | POA: Insufficient documentation

## 2013-04-15 DIAGNOSIS — F411 Generalized anxiety disorder: Secondary | ICD-10-CM | POA: Insufficient documentation

## 2013-04-15 DIAGNOSIS — Z87891 Personal history of nicotine dependence: Secondary | ICD-10-CM | POA: Insufficient documentation

## 2013-04-15 DIAGNOSIS — Z8719 Personal history of other diseases of the digestive system: Secondary | ICD-10-CM | POA: Insufficient documentation

## 2013-04-15 DIAGNOSIS — S93409A Sprain of unspecified ligament of unspecified ankle, initial encounter: Secondary | ICD-10-CM | POA: Insufficient documentation

## 2013-04-15 DIAGNOSIS — Z8739 Personal history of other diseases of the musculoskeletal system and connective tissue: Secondary | ICD-10-CM | POA: Insufficient documentation

## 2013-04-15 DIAGNOSIS — Y92009 Unspecified place in unspecified non-institutional (private) residence as the place of occurrence of the external cause: Secondary | ICD-10-CM | POA: Insufficient documentation

## 2013-04-15 DIAGNOSIS — J441 Chronic obstructive pulmonary disease with (acute) exacerbation: Secondary | ICD-10-CM | POA: Insufficient documentation

## 2013-04-15 DIAGNOSIS — Z9109 Other allergy status, other than to drugs and biological substances: Secondary | ICD-10-CM | POA: Insufficient documentation

## 2013-04-15 DIAGNOSIS — IMO0002 Reserved for concepts with insufficient information to code with codable children: Secondary | ICD-10-CM | POA: Insufficient documentation

## 2013-04-15 DIAGNOSIS — E119 Type 2 diabetes mellitus without complications: Secondary | ICD-10-CM | POA: Insufficient documentation

## 2013-04-15 DIAGNOSIS — Z8701 Personal history of pneumonia (recurrent): Secondary | ICD-10-CM | POA: Insufficient documentation

## 2013-04-15 DIAGNOSIS — J40 Bronchitis, not specified as acute or chronic: Secondary | ICD-10-CM

## 2013-04-15 DIAGNOSIS — I509 Heart failure, unspecified: Secondary | ICD-10-CM | POA: Insufficient documentation

## 2013-04-15 DIAGNOSIS — Z87442 Personal history of urinary calculi: Secondary | ICD-10-CM | POA: Insufficient documentation

## 2013-04-15 DIAGNOSIS — I10 Essential (primary) hypertension: Secondary | ICD-10-CM | POA: Insufficient documentation

## 2013-04-15 DIAGNOSIS — R296 Repeated falls: Secondary | ICD-10-CM | POA: Insufficient documentation

## 2013-04-15 DIAGNOSIS — Y9389 Activity, other specified: Secondary | ICD-10-CM | POA: Insufficient documentation

## 2013-04-15 DIAGNOSIS — Z79899 Other long term (current) drug therapy: Secondary | ICD-10-CM | POA: Insufficient documentation

## 2013-04-15 DIAGNOSIS — IMO0001 Reserved for inherently not codable concepts without codable children: Secondary | ICD-10-CM | POA: Insufficient documentation

## 2013-04-15 DIAGNOSIS — S93401A Sprain of unspecified ligament of right ankle, initial encounter: Secondary | ICD-10-CM

## 2013-04-15 HISTORY — DX: Gastric ulcer, unspecified as acute or chronic, without hemorrhage or perforation: K25.9

## 2013-04-15 HISTORY — DX: Fibromyalgia: M79.7

## 2013-04-15 MED ORDER — ALBUTEROL SULFATE HFA 108 (90 BASE) MCG/ACT IN AERS
2.0000 | INHALATION_SPRAY | RESPIRATORY_TRACT | Status: DC | PRN
  Administered 2013-04-15: 2 via RESPIRATORY_TRACT
  Filled 2013-04-15: qty 6.7

## 2013-04-15 MED ORDER — AZITHROMYCIN 250 MG PO TABS: 250.0000 mg | ORAL_TABLET | Freq: Every day | ORAL | Status: DC

## 2013-04-15 NOTE — ED Provider Notes (Signed)
CSN: [id number]     Arrival date & time 04/15/13  8527 History   First MD Initiated Contact with Patient 04/15/13 0957     Chief Complaint  Patient presents with  . Cough  . Shortness of Breath   (Consider location/radiation/quality/duration/timing/severity/associated sxs/prior Treatment) HPI Comments: Pt states that he has been having cough and sob for the last week:pt states that he has a history of copd but has not been taking any medications for PO:EUMPNT fever:no cp:pt states that he is always sob:pt states that he also fell in the yard a couple of days ago and now is having right ankle pain:pt state that he has had a gait problem over the last year and his doctor is working him up for it:no loc with the fall  The history is provided by the patient. No language interpreter was used.    Past Medical History  Diagnosis Date  . Diabetes mellitus   . Hypertension   . Pancreatitis   . Kidney stone   . CHF (congestive heart failure)   . Pneumonia   . 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 Neck pain   . Seasonal allergies   . Asthma   . Anxiety   . COPD (chronic obstructive pulmonary disease)   . Hyperlipidemia   . Gastric ulcer   . Fibromyalgia    Past Surgical History  Procedure Laterality Date  . Cholecystectomy    . Multiple cysts removal-hip,wrist    . Upper gastrointestinal endoscopy    . Colonoscopy     Family History  Problem Relation Age of Onset  . Colon cancer Neg Hx   . Esophageal cancer Neg Hx   . Rectal cancer Neg Hx   . Skin cancer Father   . Lung cancer Paternal Grandfather   . Stomach cancer Maternal Grandfather   . Bone cancer Paternal Grandmother   . Diabetes Mother   . Diabetes Maternal Grandmother   . Heart disease Maternal Grandmother   . Heart disease Father   . Heart disease Paternal Grandfather   . Heart attack Paternal Grandmother    History  Substance Use Topics  . Smoking status: Former Smoker    Types: Cigarettes   Quit date: 05/23/2002  . Smokeless tobacco: Never Used  . Alcohol Use: No    Review of Systems  Constitutional: Negative.   Respiratory: Positive for cough.   Cardiovascular: Negative.     Allergies  Codeine  Home Medications   Current Outpatient Rx  Name  Route  Sig  Dispense  Refill  . donepezil (ARICEPT) 10 MG tablet   Oral   Take 10 mg by mouth at bedtime.         . gabapentin (NEURONTIN) 400 MG capsule   Oral   Take 400 mg by mouth 3 (three) times daily.         Marland Kitchen albuterol (PROVENTIL HFA;VENTOLIN HFA) 108 (90 BASE) MCG/ACT inhaler   Inhalation   Inhale 2 puffs into the lungs every 6 (six) hours as needed. For shortness of breath          . aspirin 81 MG tablet   Oral   Take 81 mg by mouth daily.           . calcium & magnesium carbonates (MYLANTA) 614-431 MG per tablet   Oral   Take 2 tablets by mouth daily as needed. For indigestion          . citalopram (CELEXA)  40 MG tablet   Oral   Take 40 mg by mouth daily.           . cyanocobalamin (,VITAMIN B-12,) 1000 MCG/ML injection   Intramuscular   Inject 1,000 mcg into the muscle once a week.          . diphenoxylate-atropine (LOMOTIL) 2.5-0.025 MG per tablet   Oral   Take 1 tablet by mouth 4 (four) times daily as needed for diarrhea or loose stools.   30 tablet   0   . fluticasone (FLONASE) 50 MCG/ACT nasal spray   Nasal   Place 2 sprays into the nose daily.   16 g   0   . furosemide (LASIX) 40 MG tablet   Oral   Take 40 mg by mouth 2 (two) times daily.           Marland Kitchen glimepiride (AMARYL) 4 MG tablet   Oral   Take 4 mg by mouth 2 (two) times daily.           . Glucosamine 500 MG TABS   Oral   Take 500 mg by mouth 2 (two) times daily.           Marland Kitchen HYDROcodone-acetaminophen (NORCO) 10-325 MG per tablet   Oral   Take 1 tablet by mouth every 4 (four) hours as needed for pain.   30 tablet   0   . Liraglutide (VICTOZA) 18 MG/3ML SOLN   Subcutaneous   Inject into the skin  daily.          Marland Kitchen lisinopril (PRINIVIL,ZESTRIL) 40 MG tablet   Oral   Take 40 mg by mouth 2 (two) times daily.          Marland Kitchen LORazepam (ATIVAN) 1 MG tablet   Oral   Take 1 mg by mouth 2 (two) times daily.           . Multiple Vitamins-Minerals (MULTIVITAMIN WITH MINERALS) tablet   Oral   Take 2 tablets by mouth daily.         . ondansetron (ZOFRAN ODT) 4 MG disintegrating tablet   Oral   Take 1 tablet (4 mg total) by mouth every 8 (eight) hours as needed for nausea.   30 tablet   0   . Potassium Gluconate 595 MG CAPS   Oral   Take 595 mg by mouth once.         . pseudoephedrine (SUDAFED) 30 MG tablet   Oral   Take 30 mg by mouth every 4 (four) hours as needed.         . tiotropium (SPIRIVA) 18 MCG inhalation capsule   Inhalation   Place 18 mcg into inhaler and inhale 2 (two) times daily as needed (wheezing).         . traMADol (ULTRAM) 50 MG tablet   Oral   Take 1 tablet (50 mg total) by mouth every 6 (six) hours as needed for pain.   30 tablet   0    BP 108/84  Pulse 96  Temp(Src) 98.4 F (36.9 C) (Oral)  Resp 24  Ht 5' 9"  (1.753 m)  Wt 265 lb (120.203 kg)  BMI 39.12 kg/m2  SpO2 97% Physical Exam  Nursing note and vitals reviewed. Constitutional: He is oriented to person, place, and time. He appears well-developed and well-nourished.  HENT:  Head: Normocephalic and atraumatic.  Right Ear: External ear normal.  Left Ear: External ear normal.  Mouth/Throat: Posterior oropharyngeal erythema present.  Neck:  Normal range of motion. Neck supple.  Cardiovascular: Normal rate and regular rhythm.   Pulmonary/Chest: Effort normal and breath sounds normal.  Musculoskeletal: Normal range of motion.  Neurological: He is alert and oriented to person, place, and time. Coordination normal.  Ambulatory with a cane  Skin: Skin is warm and dry.  Psychiatric: He has a normal mood and affect.    ED Course  Procedures (including critical care time) Labs  Review Labs Reviewed - No data to display Imaging Review Dg Chest 2 View  04/15/2013   CLINICAL DATA:  Cough, congestion, sore throat  EXAM: CHEST  2 VIEW  COMPARISON:  11/19/2012  FINDINGS: Cardiomediastinal silhouette is unremarkable. No acute infiltrate or pleural effusion. No pulmonary edema. Mild bronchitic changes. Mild degenerative changes thoracic spine.  IMPRESSION: No acute infiltrate or pulmonary edema.  Mild bronchitic changes.   Electronically Signed   By: Lahoma Crocker M.D.   On: 04/15/2013 10:33   Dg Ankle Complete Right  04/15/2013   CLINICAL DATA:  Fall, right ankle injury  EXAM: RIGHT ANKLE - COMPLETE 3+ VIEW  COMPARISON:  10/08/2012  FINDINGS: Three views of right ankle submitted. No acute fracture or subluxation. Posterior spurring of calcaneus. Ankle mortise is preserved.  IMPRESSION: No acute fracture or subluxation.  Posterior spurring of calcaneus.   Electronically Signed   By: Lahoma Crocker M.D.   On: 04/15/2013 10:34    EKG Interpretation   None       MDM   1. Bronchitis   2. Ankle sprain, right, initial encounter    Pt given an inhaler here:no prednisone as diabetic and pt is non compliant:pt given zithromax and told that he would wait another couple of days before taking them    Glendell Docker, NP 04/15/13 1052

## 2013-04-15 NOTE — ED Notes (Signed)
Cough and SHOB x 1 week.  He also reports an unsteady gait x 1 month and fell yesterday and the day before.  He reports right ankle pain.

## 2013-04-16 NOTE — ED Provider Notes (Signed)
Medical screening examination/treatment/procedure(s) were performed by non-physician practitioner and as supervising physician I was immediately available for consultation/collaboration.  EKG Interpretation   None        Neta Ehlers, MD 04/16/13 1752

## 2013-04-18 ENCOUNTER — Emergency Department (HOSPITAL_BASED_OUTPATIENT_CLINIC_OR_DEPARTMENT_OTHER): Payer: Self-pay

## 2013-04-18 ENCOUNTER — Emergency Department (HOSPITAL_BASED_OUTPATIENT_CLINIC_OR_DEPARTMENT_OTHER)
Admission: EM | Admit: 2013-04-18 | Discharge: 2013-04-18 | Disposition: A | Discharge status: Home / Self Care | Payer: Self-pay | Attending: Emergency Medicine | Admitting: Emergency Medicine

## 2013-04-18 ENCOUNTER — Encounter (HOSPITAL_BASED_OUTPATIENT_CLINIC_OR_DEPARTMENT_OTHER): Payer: Self-pay | Admitting: Emergency Medicine

## 2013-04-18 DIAGNOSIS — R5381 Other malaise: Secondary | ICD-10-CM | POA: Insufficient documentation

## 2013-04-18 DIAGNOSIS — I509 Heart failure, unspecified: Secondary | ICD-10-CM | POA: Insufficient documentation

## 2013-04-18 DIAGNOSIS — E119 Type 2 diabetes mellitus without complications: Secondary | ICD-10-CM | POA: Insufficient documentation

## 2013-04-18 DIAGNOSIS — J441 Chronic obstructive pulmonary disease with (acute) exacerbation: Secondary | ICD-10-CM | POA: Insufficient documentation

## 2013-04-18 DIAGNOSIS — Z7982 Long term (current) use of aspirin: Secondary | ICD-10-CM | POA: Insufficient documentation

## 2013-04-18 DIAGNOSIS — F411 Generalized anxiety disorder: Secondary | ICD-10-CM | POA: Insufficient documentation

## 2013-04-18 DIAGNOSIS — R739 Hyperglycemia, unspecified: Secondary | ICD-10-CM

## 2013-04-18 DIAGNOSIS — Z79899 Other long term (current) drug therapy: Secondary | ICD-10-CM | POA: Insufficient documentation

## 2013-04-18 DIAGNOSIS — Z8701 Personal history of pneumonia (recurrent): Secondary | ICD-10-CM | POA: Insufficient documentation

## 2013-04-18 DIAGNOSIS — Z8719 Personal history of other diseases of the digestive system: Secondary | ICD-10-CM | POA: Insufficient documentation

## 2013-04-18 DIAGNOSIS — IMO0002 Reserved for concepts with insufficient information to code with codable children: Secondary | ICD-10-CM | POA: Insufficient documentation

## 2013-04-18 DIAGNOSIS — I10 Essential (primary) hypertension: Secondary | ICD-10-CM | POA: Insufficient documentation

## 2013-04-18 DIAGNOSIS — Z87442 Personal history of urinary calculi: Secondary | ICD-10-CM | POA: Insufficient documentation

## 2013-04-18 DIAGNOSIS — J40 Bronchitis, not specified as acute or chronic: Secondary | ICD-10-CM

## 2013-04-18 DIAGNOSIS — E785 Hyperlipidemia, unspecified: Secondary | ICD-10-CM | POA: Insufficient documentation

## 2013-04-18 LAB — CBC WITH DIFFERENTIAL/PLATELET
Basophils Absolute: 0.1 10*3/uL (ref 0.0–0.1)
Basophils Relative: 1 % (ref 0–1)
Eosinophils Absolute: 0.1 10*3/uL (ref 0.0–0.7)
Eosinophils Relative: 2 % (ref 0–5)
HCT: 40.2 % (ref 39.0–52.0)
Hemoglobin: 13.7 g/dL (ref 13.0–17.0)
Lymphocytes Relative: 36 % (ref 12–46)
Lymphs Abs: 2.4 10*3/uL (ref 0.7–4.0)
MCH: 29.3 pg (ref 26.0–34.0)
MCHC: 34.1 g/dL (ref 30.0–36.0)
MCV: 86.1 fL (ref 78.0–100.0)
Monocytes Absolute: 0.6 10*3/uL (ref 0.1–1.0)
Neutro Abs: 3.5 10*3/uL (ref 1.7–7.7)
Neutrophils Relative %: 52 % (ref 43–77)
Platelets: 188 10*3/uL (ref 150–400)
RBC: 4.67 MIL/uL (ref 4.22–5.81)
RDW: 13.4 % (ref 11.5–15.5)
WBC: 6.6 10*3/uL (ref 4.0–10.5)

## 2013-04-18 LAB — COMPREHENSIVE METABOLIC PANEL
ALT: 29 U/L (ref 0–53)
AST: 19 U/L (ref 0–37)
Albumin: 3.7 g/dL (ref 3.5–5.2)
CO2: 26 mEq/L (ref 19–32)
Calcium: 9.3 mg/dL (ref 8.4–10.5)
Creatinine, Ser: 0.8 mg/dL (ref 0.50–1.35)
GFR calc Af Amer: >90 mL/min (ref >90)
GFR calc non Af Amer: >90 mL/min (ref >90)
Glucose, Bld: 324 mg/dL — ABNORMAL HIGH (ref 70–99)
Potassium: 4.1 mEq/L (ref 3.5–5.1)
Sodium: 137 mEq/L (ref 135–145)
Total Protein: 7 g/dL (ref 6.0–8.3)

## 2013-04-18 LAB — GLUCOSE, CAPILLARY: Glucose-Capillary: 154 mg/dL — ABNORMAL HIGH (ref 70–99)

## 2013-04-18 LAB — TROPONIN I: Troponin I: <0.3 ng/mL (ref <0.30)

## 2013-04-18 MED ORDER — INSULIN ASPART 100 UNIT/ML ~~LOC~~ SOLN
5.0000 [IU] | Freq: Once | SUBCUTANEOUS | Status: AC
  Administered 2013-04-18: 5 [IU] via SUBCUTANEOUS
  Filled 2013-04-18: qty 1

## 2013-04-18 MED ORDER — HYDROCOD POLST-CHLORPHEN POLST 10-8 MG/5ML PO LQCR: 5.0000 mL | Freq: Two times a day (BID) | ORAL | Status: DC | PRN

## 2013-04-18 MED ORDER — SODIUM CHLORIDE 0.9 % IV BOLUS (SEPSIS)
500.0000 mL | Freq: Once | INTRAVENOUS | Status: AC
  Administered 2013-04-18: 500 mL via INTRAVENOUS

## 2013-04-18 MED ORDER — HYDROCOD POLST-CHLORPHEN POLST 10-8 MG/5ML PO LQCR
ORAL | Status: AC
  Filled 2013-04-18: qty 5

## 2013-04-18 MED ORDER — HYDROCOD POLST-CHLORPHEN POLST 10-8 MG/5ML PO LQCR
5.0000 mL | Freq: Once | ORAL | Status: AC
  Administered 2013-04-18: 5 mL via ORAL

## 2013-04-18 NOTE — ED Notes (Addendum)
Patient here with multiple complaints.  States that was diagnosed with bronchitis a few days ago and was given a z-pack.  Patient checked his cbg and it was 425 at home and his MD told him to come to the ED.  Patient also c/o dizziness and weakness.

## 2013-04-18 NOTE — ED Notes (Signed)
Patient transported to X-ray 

## 2013-04-18 NOTE — ED Notes (Signed)
[id number]

## 2013-04-18 NOTE — ED Provider Notes (Signed)
CSN: [id number]     Arrival date & time 04/18/13  1619 History   First MD Initiated Contact with Patient 04/18/13 1628     Chief Complaint  Patient presents with  . Hyperglycemia  . Cough   (Consider location/radiation/quality/duration/timing/severity/associated sxs/prior Treatment) HPI Comments: Patient is a 52 year old male with history of diabetes, CHF, fibromyalgia, nonalcoholic steatohepatitis. Presents today with complaints of cough for the past 3 days and is now having high blood sugar. He states it was 425 at home and call his primary Dr. and was told to come in for evaluation. He was seen for this cough 3 days ago here and was given Zithromax however is not improving. He denies any chest pain or shortness of breath. He denies that his cough is productive and denies any fevers.  Patient is a 52 y.o. male presenting with hyperglycemia and cough. The history is provided by the patient.  Hyperglycemia Severity:  Moderate Onset quality:  Gradual Duration:  3 days Timing:  Constant Progression:  Worsening Chronicity:  New Diabetes status:  Controlled with insulin Context: not change in medication   Relieved by:  Nothing Ineffective treatments:  None tried Associated symptoms: fatigue   Associated symptoms: no abdominal pain and no fever   Cough Associated symptoms: no fever     Past Medical History  Diagnosis Date  . Diabetes mellitus   . Hypertension   . Pancreatitis   . Kidney stone   . CHF (congestive heart failure)   . Pneumonia   . 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 Neck pain   . Seasonal allergies   . Asthma   . Anxiety   . COPD (chronic obstructive pulmonary disease)   . Hyperlipidemia   . Gastric ulcer   . Fibromyalgia    Past Surgical History  Procedure Laterality Date  . Cholecystectomy    . Multiple cysts removal-hip,wrist    . Upper gastrointestinal endoscopy    . Colonoscopy     Family History  Problem Relation Age of Onset  .  Colon cancer Neg Hx   . Esophageal cancer Neg Hx   . Rectal cancer Neg Hx   . Skin cancer Father   . Lung cancer Paternal Grandfather   . Stomach cancer Maternal Grandfather   . Bone cancer Paternal Grandmother   . Diabetes Mother   . Diabetes Maternal Grandmother   . Heart disease Maternal Grandmother   . Heart disease Father   . Heart disease Paternal Grandfather   . Heart attack Paternal Grandmother    History  Substance Use Topics  . Smoking status: Former Smoker    Types: Cigarettes    Quit date: 05/23/2002  . Smokeless tobacco: Never Used  . Alcohol Use: No    Review of Systems  Constitutional: Positive for fatigue. Negative for fever.  Respiratory: Positive for cough.   Gastrointestinal: Negative for abdominal pain.  All other systems reviewed and are negative.    Allergies  Codeine  Home Medications   Current Outpatient Rx  Name  Route  Sig  Dispense  Refill  . albuterol (PROVENTIL HFA;VENTOLIN HFA) 108 (90 BASE) MCG/ACT inhaler   Inhalation   Inhale 2 puffs into the lungs every 6 (six) hours as needed. For shortness of breath          . aspirin 81 MG tablet   Oral   Take 81 mg by mouth daily.           Marland Kitchen  azithromycin (ZITHROMAX) 250 MG tablet   Oral   Take 1 tablet (250 mg total) by mouth daily. Take first 2 tablets together, then 1 every day until finished.   6 tablet   0   . calcium & magnesium carbonates (MYLANTA) 086-578 MG per tablet   Oral   Take 2 tablets by mouth daily as needed. For indigestion          . citalopram (CELEXA) 40 MG tablet   Oral   Take 40 mg by mouth daily.           . cyanocobalamin (,VITAMIN B-12,) 1000 MCG/ML injection   Intramuscular   Inject 1,000 mcg into the muscle once a week.          . diphenoxylate-atropine (LOMOTIL) 2.5-0.025 MG per tablet   Oral   Take 1 tablet by mouth 4 (four) times daily as needed for diarrhea or loose stools.   30 tablet   0   . donepezil (ARICEPT) 10 MG tablet   Oral    Take 10 mg by mouth at bedtime.         . fluticasone (FLONASE) 50 MCG/ACT nasal spray   Nasal   Place 2 sprays into the nose daily.   16 g   0   . furosemide (LASIX) 40 MG tablet   Oral   Take 40 mg by mouth 2 (two) times daily.           Marland Kitchen gabapentin (NEURONTIN) 400 MG capsule   Oral   Take 400 mg by mouth 3 (three) times daily.         Marland Kitchen glimepiride (AMARYL) 4 MG tablet   Oral   Take 4 mg by mouth 2 (two) times daily.           . Glucosamine 500 MG TABS   Oral   Take 500 mg by mouth 2 (two) times daily.           Marland Kitchen HYDROcodone-acetaminophen (NORCO) 10-325 MG per tablet   Oral   Take 1 tablet by mouth every 4 (four) hours as needed for pain.   30 tablet   0   . Liraglutide (VICTOZA) 18 MG/3ML SOLN   Subcutaneous   Inject into the skin daily.          Marland Kitchen lisinopril (PRINIVIL,ZESTRIL) 40 MG tablet   Oral   Take 40 mg by mouth 2 (two) times daily.          Marland Kitchen LORazepam (ATIVAN) 1 MG tablet   Oral   Take 1 mg by mouth 2 (two) times daily.           . Multiple Vitamins-Minerals (MULTIVITAMIN WITH MINERALS) tablet   Oral   Take 2 tablets by mouth daily.         . ondansetron (ZOFRAN ODT) 4 MG disintegrating tablet   Oral   Take 1 tablet (4 mg total) by mouth every 8 (eight) hours as needed for nausea.   30 tablet   0   . Potassium Gluconate 595 MG CAPS   Oral   Take 595 mg by mouth once.         . pseudoephedrine (SUDAFED) 30 MG tablet   Oral   Take 30 mg by mouth every 4 (four) hours as needed.         . tiotropium (SPIRIVA) 18 MCG inhalation capsule   Inhalation   Place 18 mcg into inhaler and inhale 2 (two) times daily as  needed (wheezing).         . traMADol (ULTRAM) 50 MG tablet   Oral   Take 1 tablet (50 mg total) by mouth every 6 (six) hours as needed for pain.   30 tablet   0    BP 158/97  Temp(Src) 98.3 F (36.8 C) (Oral)  Resp 20  Wt 265 lb (120.203 kg)  SpO2 100% Physical Exam  Nursing note and vitals  reviewed. Constitutional: He is oriented to person, place, and time. He appears well-developed and well-nourished. No distress.  HENT:  Head: Normocephalic and atraumatic.  Mouth/Throat: Oropharynx is clear and moist.  Neck: Normal range of motion. Neck supple.  Cardiovascular: Normal rate, regular rhythm and normal heart sounds.   No murmur heard. Pulmonary/Chest: Effort normal and breath sounds normal. No respiratory distress. He has no wheezes. He has no rales.  Abdominal: Soft. Bowel sounds are normal. He exhibits no distension. There is no tenderness.  Musculoskeletal: Normal range of motion. He exhibits no edema.  Lymphadenopathy:    He has no cervical adenopathy.  Neurological: He is alert and oriented to person, place, and time.  Skin: Skin is warm and dry. He is not diaphoretic.    ED Course  Procedures (including critical care time) Labs Review Labs Reviewed  GLUCOSE, CAPILLARY - Abnormal; Notable for the following:    Glucose-Capillary 303 (*)    All other components within normal limits  CBC WITH DIFFERENTIAL  COMPREHENSIVE METABOLIC PANEL  PRO B NATRIURETIC PEPTIDE  TROPONIN I   Imaging Review No results found.  EKG Interpretation    Date/Time:  Thursday April 18 2013 16:44:45 EST Ventricular Rate:  81 PR Interval:  124 QRS Duration: 86 QT Interval:  374 QTC Calculation: 434 R Axis:   57 Text Interpretation:  Normal sinus rhythm Normal ECG Confirmed by DELOS  MD, Meila Berke ([id number]) on 04/18/2013 4:49:35 PM            MDM  No diagnosis found. Patient is a 52 year old male past medical history of diabetes who presents with cough and feeling weak. He checked his blood sugar at home and it was 4.5. Here in the ED it was 303 and he was given intravenous fluids and insulin. Sugars now much improved he appears to be feeling better. He is currently taking Zithromax and requested a cough syrup. He was given Tussionex in the ED and is feeling better. There is no  hypoxia and cardiac workup is also unremarkable. He will be discharged and advised to continue the Zithromax. He is to return as needed if his symptoms worsen or change.    Veryl Speak, MD 04/18/13 [phone]

## 2013-04-20 ENCOUNTER — Inpatient Hospital Stay (HOSPITAL_COMMUNITY): Payer: Medicaid Other

## 2013-04-20 ENCOUNTER — Encounter (HOSPITAL_BASED_OUTPATIENT_CLINIC_OR_DEPARTMENT_OTHER): Payer: Self-pay | Admitting: Emergency Medicine

## 2013-04-20 ENCOUNTER — Emergency Department (HOSPITAL_BASED_OUTPATIENT_CLINIC_OR_DEPARTMENT_OTHER): Payer: Medicaid Other

## 2013-04-20 ENCOUNTER — Inpatient Hospital Stay (HOSPITAL_BASED_OUTPATIENT_CLINIC_OR_DEPARTMENT_OTHER)
Admission: EM | Admit: 2013-04-20 | Discharge: 2013-04-24 | DRG: 062 | Disposition: A | Discharge status: Home / Self Care | Payer: Medicaid Other | Attending: Neurology | Admitting: Neurology

## 2013-04-20 DIAGNOSIS — Z9282 Status post administration of tPA (rtPA) in a different facility within the last 24 hours prior to admission to current facility: Secondary | ICD-10-CM

## 2013-04-20 DIAGNOSIS — F411 Generalized anxiety disorder: Secondary | ICD-10-CM | POA: Diagnosis present

## 2013-04-20 DIAGNOSIS — R51 Headache: Secondary | ICD-10-CM | POA: Diagnosis present

## 2013-04-20 DIAGNOSIS — Z79899 Other long term (current) drug therapy: Secondary | ICD-10-CM

## 2013-04-20 DIAGNOSIS — K279 Peptic ulcer, site unspecified, unspecified as acute or chronic, without hemorrhage or perforation: Secondary | ICD-10-CM | POA: Diagnosis present

## 2013-04-20 DIAGNOSIS — I509 Heart failure, unspecified: Secondary | ICD-10-CM | POA: Diagnosis present

## 2013-04-20 DIAGNOSIS — E782 Mixed hyperlipidemia: Secondary | ICD-10-CM | POA: Diagnosis present

## 2013-04-20 DIAGNOSIS — E785 Hyperlipidemia, unspecified: Secondary | ICD-10-CM | POA: Diagnosis present

## 2013-04-20 DIAGNOSIS — K7581 Nonalcoholic steatohepatitis (NASH): Secondary | ICD-10-CM | POA: Diagnosis present

## 2013-04-20 DIAGNOSIS — Z823 Family history of stroke: Secondary | ICD-10-CM

## 2013-04-20 DIAGNOSIS — J449 Chronic obstructive pulmonary disease, unspecified: Secondary | ICD-10-CM | POA: Diagnosis present

## 2013-04-20 DIAGNOSIS — R519 Headache, unspecified: Secondary | ICD-10-CM | POA: Diagnosis present

## 2013-04-20 DIAGNOSIS — I10 Essential (primary) hypertension: Secondary | ICD-10-CM | POA: Diagnosis present

## 2013-04-20 DIAGNOSIS — E1149 Type 2 diabetes mellitus with other diabetic neurological complication: Secondary | ICD-10-CM | POA: Diagnosis present

## 2013-04-20 DIAGNOSIS — E119 Type 2 diabetes mellitus without complications: Secondary | ICD-10-CM

## 2013-04-20 DIAGNOSIS — K7460 Unspecified cirrhosis of liver: Secondary | ICD-10-CM | POA: Diagnosis present

## 2013-04-20 DIAGNOSIS — I639 Cerebral infarction, unspecified: Secondary | ICD-10-CM | POA: Diagnosis present

## 2013-04-20 DIAGNOSIS — IMO0001 Reserved for inherently not codable concepts without codable children: Secondary | ICD-10-CM | POA: Diagnosis present

## 2013-04-20 DIAGNOSIS — K7689 Other specified diseases of liver: Secondary | ICD-10-CM | POA: Diagnosis present

## 2013-04-20 DIAGNOSIS — M797 Fibromyalgia: Secondary | ICD-10-CM | POA: Diagnosis present

## 2013-04-20 DIAGNOSIS — J4489 Other specified chronic obstructive pulmonary disease: Secondary | ICD-10-CM | POA: Diagnosis present

## 2013-04-20 DIAGNOSIS — G8190 Hemiplegia, unspecified affecting unspecified side: Secondary | ICD-10-CM | POA: Diagnosis present

## 2013-04-20 DIAGNOSIS — I6350 Cerebral infarction due to unspecified occlusion or stenosis of unspecified cerebral artery: Principal | ICD-10-CM | POA: Diagnosis present

## 2013-04-20 DIAGNOSIS — E1142 Type 2 diabetes mellitus with diabetic polyneuropathy: Secondary | ICD-10-CM | POA: Diagnosis present

## 2013-04-20 DIAGNOSIS — G4733 Obstructive sleep apnea (adult) (pediatric): Secondary | ICD-10-CM | POA: Diagnosis present

## 2013-04-20 HISTORY — DX: Obstructive sleep apnea (adult) (pediatric): G47.33

## 2013-04-20 LAB — DIFFERENTIAL
Basophils Absolute: 0.1 10*3/uL (ref 0.0–0.1)
Eosinophils Relative: 1 % (ref 0–5)
Lymphocytes Relative: 34 % (ref 12–46)
Lymphs Abs: 2.7 10*3/uL (ref 0.7–4.0)
Monocytes Absolute: 0.7 10*3/uL (ref 0.1–1.0)
Monocytes Relative: 9 % (ref 3–12)
Neutro Abs: 4.3 10*3/uL (ref 1.7–7.7)

## 2013-04-20 LAB — COMPREHENSIVE METABOLIC PANEL
Alkaline Phosphatase: 52 U/L (ref 39–117)
CO2: 25 mEq/L (ref 19–32)
Calcium: 9.4 mg/dL (ref 8.4–10.5)
Creatinine, Ser: 0.7 mg/dL (ref 0.50–1.35)
GFR calc Af Amer: >90 mL/min (ref >90)
GFR calc non Af Amer: >90 mL/min (ref >90)
Glucose, Bld: 263 mg/dL — ABNORMAL HIGH (ref 70–99)

## 2013-04-20 LAB — CBC
HCT: 43.3 % (ref 39.0–52.0)
Hemoglobin: 14.9 g/dL (ref 13.0–17.0)
MCH: 28.9 pg (ref 26.0–34.0)
MCV: 84.1 fL (ref 78.0–100.0)
RDW: 13.2 % (ref 11.5–15.5)
WBC: 7.8 10*3/uL (ref 4.0–10.5)

## 2013-04-20 LAB — URINE MICROSCOPIC-ADD ON

## 2013-04-20 LAB — ETHANOL: Alcohol, Ethyl (B): <11 mg/dL (ref 0–11)

## 2013-04-20 LAB — URINALYSIS, ROUTINE W REFLEX MICROSCOPIC
Hgb urine dipstick: NEGATIVE
Leukocytes, UA: NEGATIVE
Nitrite: NEGATIVE
Protein, ur: NEGATIVE mg/dL
Urobilinogen, UA: 0.2 mg/dL (ref 0.0–1.0)

## 2013-04-20 LAB — RAPID URINE DRUG SCREEN, HOSP PERFORMED
Benzodiazepines: NOT DETECTED
Cocaine: NOT DETECTED
Tetrahydrocannabinol: NOT DETECTED

## 2013-04-20 LAB — TROPONIN I: Troponin I: <0.3 ng/mL (ref <0.30)

## 2013-04-20 LAB — APTT: aPTT: 24 seconds (ref 24–37)

## 2013-04-20 LAB — GLUCOSE, CAPILLARY: Glucose-Capillary: 203 mg/dL — ABNORMAL HIGH (ref 70–99)

## 2013-04-20 MED ORDER — HYDROCODONE-ACETAMINOPHEN 10-325 MG PO TABS
1.0000 | ORAL_TABLET | Freq: Four times a day (QID) | ORAL | Status: DC | PRN
  Administered 2013-04-20 – 2013-04-21 (×2): 1 via ORAL
  Filled 2013-04-20 (×2): qty 1

## 2013-04-20 MED ORDER — TIOTROPIUM BROMIDE MONOHYDRATE 18 MCG IN CAPS
18.0000 ug | ORAL_CAPSULE | Freq: Every day | RESPIRATORY_TRACT | Status: DC
  Administered 2013-04-21 – 2013-04-24 (×4): 18 ug via RESPIRATORY_TRACT
  Filled 2013-04-20: qty 5

## 2013-04-20 MED ORDER — PANTOPRAZOLE SODIUM 40 MG IV SOLR
40.0000 mg | Freq: Every day | INTRAVENOUS | Status: DC
  Administered 2013-04-20: 40 mg via INTRAVENOUS
  Filled 2013-04-20 (×2): qty 40

## 2013-04-20 MED ORDER — ACETAMINOPHEN 650 MG RE SUPP: 650.0000 mg | RECTAL | Status: DC | PRN

## 2013-04-20 MED ORDER — FLUTICASONE PROPIONATE 50 MCG/ACT NA SUSP: 2.0000 | Freq: Every day | NASAL | Status: DC

## 2013-04-20 MED ORDER — TIOTROPIUM BROMIDE MONOHYDRATE 18 MCG IN CAPS: 18.0000 ug | ORAL_CAPSULE | Freq: Two times a day (BID) | RESPIRATORY_TRACT | Status: DC

## 2013-04-20 MED ORDER — SODIUM CHLORIDE 0.9 % IV BOLUS (SEPSIS)
1000.0000 mL | Freq: Once | INTRAVENOUS | Status: AC
  Administered 2013-04-20: 1000 mL via INTRAVENOUS

## 2013-04-20 MED ORDER — ALTEPLASE (STROKE) FULL DOSE INFUSION
0.9000 mg/kg | Freq: Once | INTRAVENOUS | Status: DC
  Administered 2013-04-20: 81 mg via INTRAVENOUS

## 2013-04-20 MED ORDER — SODIUM CHLORIDE 0.9 % IV SOLN
INTRAVENOUS | Status: DC
  Administered 2013-04-21: 05:00:00 via INTRAVENOUS

## 2013-04-20 MED ORDER — ACETAMINOPHEN 325 MG PO TABS
650.0000 mg | ORAL_TABLET | ORAL | Status: DC | PRN
  Administered 2013-04-21 – 2013-04-23 (×5): 650 mg via ORAL
  Filled 2013-04-20 (×6): qty 2

## 2013-04-20 MED ORDER — INFLUENZA VAC SPLIT QUAD 0.5 ML IM SUSP
0.5000 mL | INTRAMUSCULAR | Status: AC
  Administered 2013-04-21: 0.5 mL via INTRAMUSCULAR
  Filled 2013-04-20: qty 0.5

## 2013-04-20 MED ORDER — LABETALOL HCL 5 MG/ML IV SOLN: 10.0000 mg | INTRAVENOUS | Status: DC | PRN

## 2013-04-20 MED ORDER — SODIUM CHLORIDE 0.9 % IV SOLN: INTRAVENOUS | Status: DC

## 2013-04-20 MED ORDER — ALBUTEROL SULFATE HFA 108 (90 BASE) MCG/ACT IN AERS
2.0000 | INHALATION_SPRAY | Freq: Four times a day (QID) | RESPIRATORY_TRACT | Status: DC | PRN
  Administered 2013-04-21 – 2013-04-24 (×4): 2 via RESPIRATORY_TRACT
  Filled 2013-04-20: qty 6.7

## 2013-04-20 MED ORDER — LOPERAMIDE HCL 2 MG PO CAPS
2.0000 mg | ORAL_CAPSULE | ORAL | Status: DC | PRN
  Administered 2013-04-20 – 2013-04-21 (×2): 2 mg via ORAL
  Filled 2013-04-20 (×2): qty 1

## 2013-04-20 MED ORDER — GABAPENTIN 400 MG PO CAPS
400.0000 mg | ORAL_CAPSULE | Freq: Three times a day (TID) | ORAL | Status: DC
  Administered 2013-04-20 – 2013-04-24 (×11): 400 mg via ORAL
  Filled 2013-04-20 (×13): qty 1

## 2013-04-20 MED ORDER — ALTEPLASE 100 MG IV SOLR
INTRAVENOUS | Status: AC
  Administered 2013-04-20: 81 mg via INTRAVENOUS
  Filled 2013-04-20: qty 1

## 2013-04-20 MED ORDER — INSULIN ASPART 100 UNIT/ML ~~LOC~~ SOLN
2.0000 [IU] | SUBCUTANEOUS | Status: DC
  Administered 2013-04-20 – 2013-04-21 (×2): 6 [IU] via SUBCUTANEOUS
  Administered 2013-04-21: 4 [IU] via SUBCUTANEOUS
  Administered 2013-04-21 (×2): 6 [IU] via SUBCUTANEOUS
  Administered 2013-04-21 – 2013-04-22 (×4): 4 [IU] via SUBCUTANEOUS
  Administered 2013-04-22: 6 [IU] via SUBCUTANEOUS
  Administered 2013-04-22 – 2013-04-23 (×5): 4 [IU] via SUBCUTANEOUS

## 2013-04-20 MED ORDER — CITALOPRAM HYDROBROMIDE 40 MG PO TABS
40.0000 mg | ORAL_TABLET | Freq: Every day | ORAL | Status: DC
  Administered 2013-04-21 – 2013-04-24 (×4): 40 mg via ORAL
  Filled 2013-04-20 (×4): qty 1

## 2013-04-20 MED ORDER — PNEUMOCOCCAL VAC POLYVALENT 25 MCG/0.5ML IJ INJ
0.5000 mL | INJECTION | INTRAMUSCULAR | Status: AC
  Administered 2013-04-21: 0.5 mL via INTRAMUSCULAR
  Filled 2013-04-20: qty 0.5

## 2013-04-20 MED ORDER — SENNOSIDES-DOCUSATE SODIUM 8.6-50 MG PO TABS
1.0000 | ORAL_TABLET | Freq: Every evening | ORAL | Status: DC | PRN
  Administered 2013-04-22: 1 via ORAL
  Filled 2013-04-20 (×2): qty 1

## 2013-04-20 MED ORDER — GUAIFENESIN 100 MG/5ML PO SOLN
400.0000 mg | Freq: Four times a day (QID) | ORAL | Status: DC | PRN
Start: 2013-04-20 — End: 2013-04-24
  Administered 2013-04-20 – 2013-04-21 (×2): 400 mg via ORAL
  Filled 2013-04-20 (×3): qty 20

## 2013-04-20 MED ORDER — FUROSEMIDE 40 MG PO TABS
40.0000 mg | ORAL_TABLET | Freq: Two times a day (BID) | ORAL | Status: DC
  Administered 2013-04-21 – 2013-04-24 (×7): 40 mg via ORAL
  Filled 2013-04-20 (×10): qty 1

## 2013-04-20 NOTE — Consult Note (Deleted)
Please see H&P

## 2013-04-20 NOTE — ED Notes (Signed)
Pt is here for high blood sugar and left sided weakness and numbness. Pt was brought back to room 14 on arrival.  RN and PA and EDP to room.  Pt in CT at this time, blood has been drawn, IV in place

## 2013-04-20 NOTE — ED Provider Notes (Signed)
CSN: [id number]     Arrival date & time 04/20/13  1559 History   First MD Initiated Contact with Patient 04/20/13 1612     Chief Complaint  Patient presents with  . Code Stroke   (Consider location/radiation/quality/duration/timing/severity/associated sxs/prior Treatment) HPI A LEVEL 5 CAVEAT PERTAINS DUE TO ALTERED MENTAL STATUS AND URGENT NEED FOR INTERVENTION. Pt presents via private vehicle.  Family states he had acute onset of left sided numbness and facial droop and left arm weakness.  Family estimates this occurred between 2pm and 3pm.    Past Medical History  Diagnosis Date  . Diabetes mellitus   . Hypertension   . Pancreatitis   . Kidney stone   . CHF (congestive heart failure)   . Pneumonia   . 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 Neck pain   . Seasonal allergies   . Asthma   . Anxiety   . COPD (chronic obstructive pulmonary disease)   . Hyperlipidemia   . Gastric ulcer   . Fibromyalgia    Past Surgical History  Procedure Laterality Date  . Cholecystectomy    . Multiple cysts removal-hip,wrist    . Upper gastrointestinal endoscopy    . Colonoscopy     Family History  Problem Relation Age of Onset  . Colon cancer Neg Hx   . Esophageal cancer Neg Hx   . Rectal cancer Neg Hx   . Skin cancer Father   . Lung cancer Paternal Grandfather   . Stomach cancer Maternal Grandfather   . Bone cancer Paternal Grandmother   . Diabetes Mother   . Diabetes Maternal Grandmother   . Heart disease Maternal Grandmother   . Heart disease Father   . Heart disease Paternal Grandfather   . Heart attack Paternal Grandmother    History  Substance Use Topics  . Smoking status: Former Smoker    Types: Cigarettes    Quit date: 05/23/2002  . Smokeless tobacco: Never Used  . Alcohol Use: No    Review of Systems UNABLE TO OBTAIN ROS DUE TO LEVEL 5 CAVEAT  Allergies  Codeine  Home Medications   No current outpatient prescriptions on file. BP 162/80  Pulse  86  Temp(Src) 98.3 F (36.8 C) (Oral)  Resp 9  Ht 5' 9"  (1.753 m)  Wt 270 lb 1 oz (122.5 kg)  BMI 39.86 kg/m2  SpO2 98% VITALS REVIEWED Physical Exam Physical Examination: General appearance - alert, well appearing, and in no distress Mental status - alert, oriented to person, place, not to time, slow to answer questions Eyes - pupils equal and reactive, extraocular eye movements intact Mouth - mucous membranes moist, pharynx normal without lesions Chest - clear to auscultation, no wheezes, rales or rhonchi, symmetric air entry Heart - normal rate, regular rhythm, normal S1, S2, no murmurs, rubs, clicks or gallops Abdomen - soft, nontender, nondistended, no masses or organomegaly Neurological - alert, oriented x 2, slurred speech, left sided facial droop, left upper extremity 2/5, decreased grip strength, unable to hold left UE without drift, strength 5/5 in bilateral lower extremities Extremities - peripheral pulses normal, no pedal edema, no clubbing or cyanosis Skin - normal coloration and turgor, no rashes  ED Course  Procedures (including critical care time)  4:24 PM code stroke called,  d/w Dr. Leonel Ramsay, neurology, pt to receive TPA, NIH stroke scale of 4.  Pt has had head CT- will ensure no bleed prior to giving TPA.  Pt states he only takes Aspirin.  Blood sugar is in 200s.    4:30 PM head CT is normal per Curlene Dolphin, Radiology.    CRITICAL CARE Performed by: Threasa Beards  ?  Total critical care time: 35 Critical care time was exclusive of separately billable procedures and treating other patients.  Critical care was necessary to treat or prevent imminent or life-threatening deterioration.  Critical care was time spent personally by me on the following activities: development of treatment plan with patient and/or surrogate as well as nursing, discussions with consultants, evaluation of patient's response to treatment, examination of patient, obtaining history  from patient or surrogate, ordering and performing treatments and interventions, ordering and review of laboratory studies, ordering and review of radiographic studies, pulse oximetry and re-evaluation of patient's condition. Labs Review Labs Reviewed  COMPREHENSIVE METABOLIC PANEL - Abnormal; Notable for the following:    Glucose, Bld 263 (*)    All other components within normal limits  URINALYSIS, ROUTINE W REFLEX MICROSCOPIC - Abnormal; Notable for the following:    Specific Gravity, Urine 1.037 (*)    Glucose, UA >1000 (*)    All other components within normal limits  GLUCOSE, CAPILLARY - Abnormal; Notable for the following:    Glucose-Capillary 216 (*)    All other components within normal limits  HEMOGLOBIN A1C - Abnormal; Notable for the following:    Hemoglobin A1C 10.0 (*)    Mean Plasma Glucose 240 (*)    All other components within normal limits  LIPID PANEL - Abnormal; Notable for the following:    Triglycerides 364 (*)    HDL 26 (*)    VLDL 73 (*)    All other components within normal limits  GLUCOSE, CAPILLARY - Abnormal; Notable for the following:    Glucose-Capillary 203 (*)    All other components within normal limits  GLUCOSE, CAPILLARY - Abnormal; Notable for the following:    Glucose-Capillary 215 (*)    All other components within normal limits  GLUCOSE, CAPILLARY - Abnormal; Notable for the following:    Glucose-Capillary 164 (*)    All other components within normal limits  GLUCOSE, CAPILLARY - Abnormal; Notable for the following:    Glucose-Capillary 163 (*)    All other components within normal limits  GLUCOSE, CAPILLARY - Abnormal; Notable for the following:    Glucose-Capillary 215 (*)    All other components within normal limits  GLUCOSE, CAPILLARY - Abnormal; Notable for the following:    Glucose-Capillary 199 (*)    All other components within normal limits  GLUCOSE, CAPILLARY - Abnormal; Notable for the following:    Glucose-Capillary 237 (*)     All other components within normal limits  GLUCOSE, CAPILLARY - Abnormal; Notable for the following:    Glucose-Capillary 244 (*)    All other components within normal limits  MRSA PCR SCREENING  ETHANOL  PROTIME-INR  APTT  CBC  DIFFERENTIAL  URINE RAPID DRUG SCREEN (HOSP PERFORMED)  TROPONIN I  URINE MICROSCOPIC-ADD ON  CBC WITH DIFFERENTIAL  COMPREHENSIVE METABOLIC PANEL  HEMOGLOBIN A1C   Imaging Review Ct Head Wo Contrast  04/20/2013   CLINICAL DATA:  Weakness. Code stroke. Onset of left-sided weakness 1 hr ago.  EXAM: CT HEAD WITHOUT CONTRAST  TECHNIQUE: Contiguous axial images were obtained from the base of the skull through the vertex without intravenous contrast.  COMPARISON:  Head CT 08/11/2011 and MRI brain 08/12/2011  FINDINGS: The appearance of the brain is stable. No acute intracranial abnormality is identified. Specifically, there is no  evidence of acute intra or extra-axial hemorrhage, mass effect, mass lesion, hydrocephalus, or acute cortically based infarction. No focal vascular abnormality is identified on noncontrast head CT.  The skull is intact. There is some mucosal thickening of the right maxillary sinus. No air-fluid levels are seen in the sinuses. The mastoid air cells are clear. The soft tissues of the scalp and orbits are unremarkable.  IMPRESSION: No acute intracranial abnormality. I telephoned this report to Dr. Canary Brim at 4:28 pm on 04/20/2013.   Electronically Signed   By: Curlene Dolphin M.D.   On: 04/20/2013 16:28   Mr Brain Wo Contrast  04/21/2013   CLINICAL DATA:  52 year old male with acute onset left side weakness. Status post tPA. Initial encounter.  EXAM: MRI HEAD WITHOUT CONTRAST  MRA HEAD WITHOUT CONTRAST  TECHNIQUE: Multiplanar, multiecho pulse sequences of the brain and surrounding structures were obtained without intravenous contrast. Angiographic images of the head were obtained using MRA technique without contrast.  COMPARISON:  Head CT 04/20/2013.  [facility] brain MRI 08/12/2011.  FINDINGS: MRI HEAD FINDINGS  No restricted diffusion or evidence of acute infarction. Major intracranial vascular flow voids are stable.  Stable and normal for age gray and white matter signal.  No midline shift, mass effect, evidence of mass lesion, ventriculomegaly, extra-axial collection or acute intracranial hemorrhage. Cervicomedullary junction and pituitary are within normal limits. Negative visualized cervical spine. Normal bone marrow signal.  Visualized orbit soft tissues are within normal limits. Mild to moderate paranasal sinus mucosal thickening, increased from prior MRI. Trace mastoid fluid is stable. Stable and negative nasopharynx. Negative scalp soft tissues, stable small cystic focus medial to the left TMJ which likely is degenerative.  MRA HEAD FINDINGS  Antegrade flow in the posterior circulation with diminutive codominant distal vertebral arteries. There is distal vertebral artery irregularity, more so on the left. Moderate stenosis of the distal left vertebral artery suggested on series 405, image 4. Both PICA origins are patent, the left is proximal to the area of irregularity. Patent vertebrobasilar junction. Diminutive basilar artery is patent without stenosis. Patent SCA origins. Fetal type bilateral PCA origins. Bilateral PCA branches are within normal limits.  Antegrade flow in both ICA siphons. No ICA stenosis. Ophthalmic and posterior communicating artery origins are within normal limits.  Normal carotid termini, MCA and ACA origins. Anterior communicating artery and visualized ACA branches are within normal limits. Visualized bilateral MCA branches are within normal limits.  IMPRESSION: 1. No acute intracranial abnormality. Stable and negative non contrast MRI appearance of the brain. 2. Fetal type bilateral PCA origins with otherwise diminutive posterior circulation and evidence of distal vertebral artery atherosclerosis. Distal left  vertebral artery stenosis (distal to the PICA origin) may be hemodynamically significant. 3. Otherwise negative intracranial MRA.   Electronically Signed   By: Lars Pinks M.D.   On: 04/21/2013 16:45   Dg Chest Port 1 View  04/20/2013   CLINICAL DATA:  52 year old male with left-sided weakness - CVA.  EXAM: PORTABLE CHEST - 1 VIEW  COMPARISON:  04/18/2013 and prior chest radiographs  FINDINGS: Upper limits normal heart size again noted.  Mild peribronchial thickening is stable.  There is no evidence of focal airspace disease, pulmonary edema, suspicious pulmonary nodule/mass, pleural effusion, or pneumothorax. No acute bony abnormalities are identified.  IMPRESSION: No evidence of acute cardiopulmonary disease.   Electronically Signed   By: Hassan Rowan M.D.   On: 04/20/2013 21:04   Mr Jodene Nam Head/brain Wo Cm  04/21/2013  CLINICAL DATA:  52 year old male with acute onset left side weakness. Status post tPA. Initial encounter.  EXAM: MRI HEAD WITHOUT CONTRAST  MRA HEAD WITHOUT CONTRAST  TECHNIQUE: Multiplanar, multiecho pulse sequences of the brain and surrounding structures were obtained without intravenous contrast. Angiographic images of the head were obtained using MRA technique without contrast.  COMPARISON:  Head CT 04/20/2013. [facility] brain MRI 08/12/2011.  FINDINGS: MRI HEAD FINDINGS  No restricted diffusion or evidence of acute infarction. Major intracranial vascular flow voids are stable.  Stable and normal for age gray and white matter signal.  No midline shift, mass effect, evidence of mass lesion, ventriculomegaly, extra-axial collection or acute intracranial hemorrhage. Cervicomedullary junction and pituitary are within normal limits. Negative visualized cervical spine. Normal bone marrow signal.  Visualized orbit soft tissues are within normal limits. Mild to moderate paranasal sinus mucosal thickening, increased from prior MRI. Trace mastoid fluid is stable. Stable and negative  nasopharynx. Negative scalp soft tissues, stable small cystic focus medial to the left TMJ which likely is degenerative.  MRA HEAD FINDINGS  Antegrade flow in the posterior circulation with diminutive codominant distal vertebral arteries. There is distal vertebral artery irregularity, more so on the left. Moderate stenosis of the distal left vertebral artery suggested on series 405, image 4. Both PICA origins are patent, the left is proximal to the area of irregularity. Patent vertebrobasilar junction. Diminutive basilar artery is patent without stenosis. Patent SCA origins. Fetal type bilateral PCA origins. Bilateral PCA branches are within normal limits.  Antegrade flow in both ICA siphons. No ICA stenosis. Ophthalmic and posterior communicating artery origins are within normal limits.  Normal carotid termini, MCA and ACA origins. Anterior communicating artery and visualized ACA branches are within normal limits. Visualized bilateral MCA branches are within normal limits.  IMPRESSION: 1. No acute intracranial abnormality. Stable and negative non contrast MRI appearance of the brain. 2. Fetal type bilateral PCA origins with otherwise diminutive posterior circulation and evidence of distal vertebral artery atherosclerosis. Distal left vertebral artery stenosis (distal to the PICA origin) may be hemodynamically significant. 3. Otherwise negative intracranial MRA.   Electronically Signed   By: Lars Pinks M.D.   On: 04/21/2013 16:45    EKG Interpretation    Date/Time:  Saturday April 20 2013 16:22:00 EST Ventricular Rate:  77 PR Interval:  126 QRS Duration: 94 QT Interval:  386 QTC Calculation: 436 R Axis:   68 Text Interpretation:  Normal sinus rhythm Normal ECG No significant change since last tracing Confirmed by Canary Brim  MD, MARTHA [phone]) on 04/20/2013 9:15:42 PM            MDM   1. Stroke   2. Diabetes mellitus   3. HTN (hypertension)    Pt presenting with acute onset of left sided  facial droop and left arm weakness.  Code stroke called, pt placed on monitor, 2 peripheral IVs started, pt taken emergently to head CT.  See above notes, TPA administered after phone consultation with neurologist, no known contraindications.  Pt transported emergency to neuro ICU.      Threasa Beards, MD 04/21/13 [phone]

## 2013-04-21 ENCOUNTER — Inpatient Hospital Stay (HOSPITAL_COMMUNITY): Payer: Medicaid Other

## 2013-04-21 DIAGNOSIS — I6350 Cerebral infarction due to unspecified occlusion or stenosis of unspecified cerebral artery: Secondary | ICD-10-CM

## 2013-04-21 LAB — HEMOGLOBIN A1C
Hgb A1c MFr Bld: 10 % — ABNORMAL HIGH (ref <5.7)
Mean Plasma Glucose: 240 mg/dL — ABNORMAL HIGH (ref <117)

## 2013-04-21 LAB — LIPID PANEL
Cholesterol: 185 mg/dL (ref 0–200)
HDL: 26 mg/dL — ABNORMAL LOW (ref >39)
Triglycerides: 364 mg/dL — ABNORMAL HIGH (ref <150)

## 2013-04-21 LAB — GLUCOSE, CAPILLARY
Glucose-Capillary: 215 mg/dL — ABNORMAL HIGH (ref 70–99)
Glucose-Capillary: 164 mg/dL — ABNORMAL HIGH (ref 70–99)
Glucose-Capillary: 199 mg/dL — ABNORMAL HIGH (ref 70–99)
Glucose-Capillary: 237 mg/dL — ABNORMAL HIGH (ref 70–99)

## 2013-04-21 MED ORDER — OXYCODONE HCL 5 MG PO TABS
10.0000 mg | ORAL_TABLET | Freq: Four times a day (QID) | ORAL | Status: DC | PRN
  Administered 2013-04-21 – 2013-04-24 (×6): 10 mg via ORAL
  Filled 2013-04-21 (×6): qty 2

## 2013-04-21 MED ORDER — LISINOPRIL 40 MG PO TABS
40.0000 mg | ORAL_TABLET | Freq: Two times a day (BID) | ORAL | Status: DC
  Administered 2013-04-21 – 2013-04-24 (×7): 40 mg via ORAL
  Filled 2013-04-21 (×8): qty 1

## 2013-04-21 MED ORDER — GLIMEPIRIDE 2 MG PO TABS
2.0000 mg | ORAL_TABLET | Freq: Every day | ORAL | Status: DC
  Administered 2013-04-22 – 2013-04-24 (×3): 2 mg via ORAL
  Filled 2013-04-21 (×4): qty 1

## 2013-04-21 MED ORDER — PANTOPRAZOLE SODIUM 40 MG PO TBEC
40.0000 mg | DELAYED_RELEASE_TABLET | Freq: Every day | ORAL | Status: DC
  Administered 2013-04-21 – 2013-04-23 (×2): 40 mg via ORAL
  Filled 2013-04-21 (×3): qty 1

## 2013-04-21 MED ORDER — LORAZEPAM 1 MG PO TABS
1.0000 mg | ORAL_TABLET | Freq: Two times a day (BID) | ORAL | Status: DC
  Administered 2013-04-21 – 2013-04-24 (×6): 1 mg via ORAL
  Filled 2013-04-21 (×6): qty 1

## 2013-04-21 MED ORDER — ASPIRIN 325 MG PO TABS
325.0000 mg | ORAL_TABLET | Freq: Every day | ORAL | Status: DC
  Administered 2013-04-21 – 2013-04-22 (×2): 325 mg via ORAL
  Filled 2013-04-21 (×2): qty 1

## 2013-04-21 MED ORDER — LIRAGLUTIDE 18 MG/3ML ~~LOC~~ SOLN
1.8000 mg | Freq: Every day | SUBCUTANEOUS | Status: DC
  Administered 2013-04-22: 1.8 mg via SUBCUTANEOUS

## 2013-04-21 NOTE — Progress Notes (Addendum)
Bilateral carotid artery duplex completed.  Left:  40-59% internal carotid artery stenosis, probably due to mild vessel tortuosity.  Vertebral artery flow is retrograde.  Right:  1-39% ICA stenosis.  Vertebral artery flow is antegrade.

## 2013-04-21 NOTE — H&P (Signed)
Neurology H&P  CC: Left-sided weakness  History is obtained from: Patient, mother  HPI: Bryan Becker is a 52 y.o. male with a history of multiple medical problems including hypertension, diabetes, CHF, cirrhosis who presented to [place] today with acute onset left-sided weakness. He was evaluated in the emergency department there and after consultation with me by phone, given the acute presentation of face arm and leg weakness felt a stroke was most likely diagnosis and he was administered TPA and transferred to the ICU here to   LKW: 3 PM tpa given?: Yes NIH: 6    ROS: A 14 point ROS was performed and is negative except as noted in the HPI.  Past Medical History  Diagnosis Date  . Diabetes mellitus   . Hypertension   . Pancreatitis   . Kidney stone   . CHF (congestive heart failure)   . Pneumonia   . 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 Neck pain   . Seasonal allergies   . Asthma   . Anxiety   . COPD (chronic obstructive pulmonary disease)   . Hyperlipidemia   . Gastric ulcer   . Fibromyalgia     Family History: The family members with stroke  Social History: Tob: Denies  Exam: Current vital signs: BP 141/74  Pulse 88  Temp(Src) 97.9 F (36.6 C) (Oral)  Resp 22  Ht 5' 9"  (1.753 m)  Wt 122.5 kg (270 lb 1 oz)  BMI 39.86 kg/m2  SpO2 98% Vital signs in last 24 hours: Temp:  [97.4 F (36.3 C)-98.3 F (36.8 C)] 97.9 F (36.6 C) (11/30 0800) Pulse Rate:  [73-96] 88 (11/30 1000) Resp:  [15-61] 22 (11/30 1000) BP: (126-177)/(50-91) 141/74 mmHg (11/30 1010) SpO2:  [93 %-100 %] 98 % (11/30 1000) Weight:  [122.5 kg (270 lb 1 oz)] 122.5 kg (270 lb 1 oz) (11/29 1700)  General: In bed, NAD CV: Regular in rhythm Mental Status: Patient is awake, alert, oriented to person, place, month, year, and situation. Immediate and remote memory are intact. Patient is able to give a clear and coherent history. No signs of aphasia or  neglect Cranial Nerves: II: Visual Fields are full. Pupils are equal, round, and reactive to light.  Discs are difficult to visualize. III,IV, VI: EOMI without ptosis or diploplia.  V: Facial sensation is symmetric to temperature VII: Facial movement is notable for less movement on the left side when asked to smile VIII: hearing is intact to voice X: Uvula elevates symmetrically XI: Shoulder shrug is symmetric. XII: tongue is midline without atrophy or fasciculations.  Motor: Tone is normal. Bulk is normal. 5/5 strength was present in the right, on the left he has mild 4/5 weakness in the arm and leg with some give way component. Sensory: Sensation is decreased throughout the left side Deep Tendon Reflexes: 2+ and symmetric in the biceps and patellae.  Cerebellar: FNF and HKS are intact on right, consistent with weakness on left Gait: Not assessed due to acute nature of evaluation and multiple medical monitors in ED setting.     I have reviewed labs in epic and the results pertinent to this consultation are: Glucose is 216 CMP-unremarkable other than elevated glucose CBC unremarkable  I have reviewed the images obtained: CT head-negative  Impression: 52 year old male with acute onset left-sided weakness in the setting of multiple stroke risk factors. He was administered TPA and will be observed overnight in the ICU.   Recommendations: 1. HgbA1c, fasting  lipid panel 2. MRI, MRA  of the brain without contrast 3. Frequent neuro checks 4. Echocardiogram 5. Carotid dopplers 6. Prophylactic therapy-None 7. Risk factor modification 8. Telemetry monitoring 9. PT consult, OT consult, Speech consult 10. Continue Flonase and Spiriva 11. Continue home dose Lasix 12. Will use ICU hyperglycemia scale for diabetes control  This patient is critically ill and at significant risk of neurological worsening, death and care requires constant monitoring of vital signs,  hemodynamics,respiratory and cardiac monitoring, neurological assessment, discussion with family, other specialists and medical decision making of high complexity. I spent 45 minutes of neurocritical care time  in the care of  this patient.  Roland Rack, MD [facility] [phone]  If 7pm- 7am, please page neurology on call at [phone].  04/21/2013  12:07 PM

## 2013-04-21 NOTE — Progress Notes (Signed)
PT Cancellation Note  Patient Details Name: Bryan Becker MRN: [id number] DOB: 08/24/1960   Cancelled Treatment:    Reason Eval/Treat Not Completed: Patient not medically ready (Pt currently on bedrest.)   Marquavious Nazar 04/21/2013, 11:16 AM Antoine Poche, [place] DPT [phone]

## 2013-04-21 NOTE — Progress Notes (Signed)
Stroke Team Progress Note  HISTORY Bryan Becker is a 52 y.o. male with a history of multiple medical problems including hypertension, diabetes, CHF, cirrhosis who presented to [facility] today with acute onset left-sided weakness. He was evaluated in the emergency department there and after consultation with me by phone, given the acute presentation of face arm and leg weakness felt a stroke was most likely diagnosis and he was administered TPA and transferred to the ICU. LKW: 3 PM  tpa given?: Yes  NIH: 6   Patient was a TPA candidate. He was admitted to the neuro ICU 3100 for further evaluation and treatment.  SUBJECTIVE His wife is at bedside. The patient complains of the headache, indicates that the left hemisensory deficit has improved, some residual on the left leg.  OBJECTIVE Most recent Vital Signs: Filed Vitals:   04/21/13 0246 04/21/13 0351 04/21/13 0700 04/21/13 0800  BP:   134/74 141/76  Pulse:   77 81  Temp:  97.4 F (36.3 C)    TempSrc:  Oral    Resp:   17 19  Height:      Weight:      SpO2: 100%  94% 96%   CBG (last 3)   Recent Labs  04/21/13 0007 04/21/13 0354 04/21/13 0735  GLUCAP 215* 164* 163*    IV Fluid Intake:   . sodium chloride 50 mL/hr at 04/21/13 0516    MEDICATIONS  . citalopram  40 mg Oral Daily  . furosemide  40 mg Oral BID  . gabapentin  400 mg Oral TID  . influenza vac split quadrivalent PF  0.5 mL Intramuscular Tomorrow-1000  . insulin aspart  2-6 Units Subcutaneous Q4H  . pantoprazole (PROTONIX) IV  40 mg Intravenous QHS  . pneumococcal 23 valent vaccine  0.5 mL Intramuscular Tomorrow-1000  . tiotropium  18 mcg Inhalation Daily   PRN:  acetaminophen, acetaminophen, albuterol, guaiFENesin, HYDROcodone-acetaminophen, labetalol, loperamide, senna-docusate  Diet:  Carb Control thin liquids Activity:  Bedrest DVT Prophylaxis:  SCD  CLINICALLY SIGNIFICANT STUDIES Basic Metabolic Panel:  Recent Labs Lab 04/18/13 1643  04/20/13 1614  NA 137 137  K 4.1 3.7  CL 100 97  CO2 26 25  GLUCOSE 324* 263*  BUN 11 14  CREATININE 0.80 0.70  CALCIUM 9.3 9.4   Liver Function Tests:  Recent Labs Lab 04/18/13 1643 04/20/13 1614  AST 19 18  ALT 29 29  ALKPHOS 52 52  BILITOT 0.2* 0.3  PROT 7.0 7.6  ALBUMIN 3.7 4.1   CBC:  Recent Labs Lab 04/18/13 1643 04/20/13 1614  WBC 6.6 7.8  NEUTROABS 3.5 4.3  HGB 13.7 14.9  HCT 40.2 43.3  MCV 86.1 84.1  PLT 188 201   Coagulation:  Recent Labs Lab 04/20/13 1614  LABPROT 12.7  INR 0.97   Cardiac Enzymes:  Recent Labs Lab 04/18/13 1643 04/20/13 1615  TROPONINI <0.30 <0.30   Urinalysis:  Recent Labs Lab 04/20/13 1630  COLORURINE YELLOW  LABSPEC 1.037*  PHURINE 6.0  GLUCOSEU >1000*  HGBUR NEGATIVE  BILIRUBINUR NEGATIVE  KETONESUR NEGATIVE  PROTEINUR NEGATIVE  UROBILINOGEN 0.2  NITRITE NEGATIVE  LEUKOCYTESUR NEGATIVE   Lipid Panel    Component Value Date/Time   CHOL 185 04/21/2013 0455   TRIG 364* 04/21/2013 0455   HDL 26* 04/21/2013 0455   CHOLHDL 7.1 04/21/2013 0455   VLDL 73* 04/21/2013 0455   LDLCALC 86 04/21/2013 0455   HgbA1C  No results found for this basename: HGBA1C    Urine Drug  Screen:     Component Value Date/Time   LABOPIA NONE DETECTED 04/20/2013 1630   COCAINSCRNUR NONE DETECTED 04/20/2013 1630   LABBENZ NONE DETECTED 04/20/2013 1630   AMPHETMU NONE DETECTED 04/20/2013 1630   THCU NONE DETECTED 04/20/2013 1630   LABBARB NONE DETECTED 04/20/2013 1630    Alcohol Level:  Recent Labs Lab 04/20/13 1614  ETH <11    Ct Head Wo Contrast  04/20/2013   CLINICAL DATA:  Weakness. Code stroke. Onset of left-sided weakness 1 hr ago.  EXAM: CT HEAD WITHOUT CONTRAST  TECHNIQUE: Contiguous axial images were obtained from the base of the skull through the vertex without intravenous contrast.  COMPARISON:  Head CT 08/11/2011 and MRI brain 08/12/2011  FINDINGS: The appearance of the brain is stable. No acute intracranial  abnormality is identified. Specifically, there is no evidence of acute intra or extra-axial hemorrhage, mass effect, mass lesion, hydrocephalus, or acute cortically based infarction. No focal vascular abnormality is identified on noncontrast head CT.  The skull is intact. There is some mucosal thickening of the right maxillary sinus. No air-fluid levels are seen in the sinuses. The mastoid air cells are clear. The soft tissues of the scalp and orbits are unremarkable.  IMPRESSION: No acute intracranial abnormality. I telephoned this report to Dr. Canary Brim at 4:28 pm on 04/20/2013.   Electronically Signed   By: Curlene Dolphin M.D.   On: 04/20/2013 16:28   Dg Chest Port 1 View  04/20/2013   CLINICAL DATA:  52 year old male with left-sided weakness - CVA.  EXAM: PORTABLE CHEST - 1 VIEW  COMPARISON:  04/18/2013 and prior chest radiographs  FINDINGS: Upper limits normal heart size again noted.  Mild peribronchial thickening is stable.  There is no evidence of focal airspace disease, pulmonary edema, suspicious pulmonary nodule/mass, pleural effusion, or pneumothorax. No acute bony abnormalities are identified.  IMPRESSION: No evidence of acute cardiopulmonary disease.   Electronically Signed   By: Hassan Rowan M.D.   On: 04/20/2013 21:04    CT of the brain   IMPRESSION:  No acute intracranial abnormality  MRI of the brain    MRA of the brain    2D Echocardiogram    Carotid Doppler    CXR   IMPRESSION:  No evidence of acute cardiopulmonary disease.  EKG   Normal sinus rhythm Normal ECG  Therapy Recommendations Pending  Physical Exam  General: The patient is alert and cooperative at the time of the examination. The patient is markedly obese.  Respiratory: Lungs fields are clear.  Cardiovascular: Regular rate and rhythm, no murmurs or rubs are noted.  Abdominal: Abdomen is obese, nontender, positive bowel sounds.  Skin: No significant peripheral edema is noted.   Neurologic Exam  Mental  status: The patient is oriented x 3.  Cranial nerves: Facial symmetry is present. Speech is normal, no aphasia or dysarthria is noted. Extraocular movements are full. Visual fields are full.  Motor: The patient has good strength in all 4 extremities.  Sensory examination:Soft touch sensation is symmetric on the face, arms, and legs.  Coordination: The patient has good finger-nose-finger and heel-to-shin bilaterally.  Gait and station: The gait was not tested. No pronator drift is seen.  Reflexes: Deep tendon reflexes are symmetric, but are depressed.    ASSESSMENT [name] is a 51 y.o. male presenting with left-sided numbness, slight weakness. The patient was given full dose IV TPA, with good improvement in clinical symptoms. The patient is very close to his baseline. The  patient indicates that he was having issues with blood sugar control prior to admission. The patient was on aspirin prior to coming in the hospital.   Diabetes  Obesity  Left hemisensory deficit on presentation  Fibromyalgia  Hypertension  Nonalcoholic cirrhosis of the liver, liver biopsy done  Asthma, COPD  Dyslipidemia  Peptic ulcer disease  History of congestive heart failure  Renal calculi  Anxiety disorder  Hospital day # 1  TREATMENT/PLAN  Consider switching to Plavix after 24 hour period following TPA  MRI brain  MRA head  Carotid Doppler study  2-D echocardiogram  Oxycodone for headache  Physical, occupational therapy evaluation  Mobilize patient following 24 hour period following TPA  Risk factor management  Becker,Bryan KEITH  04/21/2013 8:38 AM

## 2013-04-21 NOTE — Progress Notes (Signed)
Dr. Leonel Ramsay paged, notified of MRI results, pt desire to use home bipap. Orders received for Aspirin, home bipap usage, and to hold on transferring pt off floor until AM.  Will updated

## 2013-04-22 DIAGNOSIS — I517 Cardiomegaly: Secondary | ICD-10-CM

## 2013-04-22 LAB — CBC WITH DIFFERENTIAL/PLATELET
Basophils Absolute: 0 10*3/uL (ref 0.0–0.1)
Basophils Relative: 0 % (ref 0–1)
Eosinophils Absolute: 0.2 10*3/uL (ref 0.0–0.7)
Eosinophils Relative: 2 % (ref 0–5)
HCT: 43.3 % (ref 39.0–52.0)
Lymphocytes Relative: 24 % (ref 12–46)
MCH: 29.4 pg (ref 26.0–34.0)
MCHC: 34.4 g/dL (ref 30.0–36.0)
MCV: 85.4 fL (ref 78.0–100.0)
Monocytes Absolute: 0.7 10*3/uL (ref 0.1–1.0)
Monocytes Relative: 8 % (ref 3–12)
Platelets: 197 10*3/uL (ref 150–400)
RBC: 5.07 MIL/uL (ref 4.22–5.81)
RDW: 13.5 % (ref 11.5–15.5)
WBC: 8.1 10*3/uL (ref 4.0–10.5)

## 2013-04-22 LAB — COMPREHENSIVE METABOLIC PANEL
ALT: 27 U/L (ref 0–53)
AST: 17 U/L (ref 0–37)
Albumin: 3.7 g/dL (ref 3.5–5.2)
BUN: 12 mg/dL (ref 6–23)
Calcium: 8.8 mg/dL (ref 8.4–10.5)
Sodium: 141 mEq/L (ref 135–145)
Total Protein: 6.7 g/dL (ref 6.0–8.3)

## 2013-04-22 LAB — GLUCOSE, CAPILLARY
Glucose-Capillary: 171 mg/dL — ABNORMAL HIGH (ref 70–99)
Glucose-Capillary: 184 mg/dL — ABNORMAL HIGH (ref 70–99)
Glucose-Capillary: 198 mg/dL — ABNORMAL HIGH (ref 70–99)
Glucose-Capillary: 215 mg/dL — ABNORMAL HIGH (ref 70–99)
Glucose-Capillary: 255 mg/dL — ABNORMAL HIGH (ref 70–99)

## 2013-04-22 MED ORDER — CLOPIDOGREL BISULFATE 75 MG PO TABS
75.0000 mg | ORAL_TABLET | Freq: Every day | ORAL | Status: DC
  Administered 2013-04-22 – 2013-04-24 (×3): 75 mg via ORAL
  Filled 2013-04-22 (×4): qty 1

## 2013-04-22 MED ORDER — LIRAGLUTIDE 18 MG/3ML ~~LOC~~ SOPN: 1.8000 mg | PEN_INJECTOR | Freq: Every day | SUBCUTANEOUS | Status: DC

## 2013-04-22 NOTE — Evaluation (Signed)
Physical Therapy Evaluation Patient Details Name: Bryan Becker MRN: [id number] DOB: [birth year] Today's Date: 04/22/2013 Time: 2831-5176 PT Time Calculation (min): 35 min  PT Assessment / Plan / Recommendation History of Present Illness  pt presents with L sided weakness and numbness.  Given tpa with partial resolution of symptoms.    Clinical Impression  Pt very motivated to improve mobility, but limited by L sided weakness and SOB 2/2 COPD.  Pt's O2 sats remained 98% on RA during mobility.  Pt very upset and frustrated by situation and not having any insurance.  Pt will need SW consult and Development worker, community.  Pt would benefit from CIR to maximize independence prior to returning to home with family.      PT Assessment  Patient needs continued PT services    Follow Up Recommendations  CIR    Does the patient have the potential to tolerate intense rehabilitation      Barriers to Discharge        Equipment Recommendations  Rolling walker with 5" wheels    Recommendations for Other Services Rehab consult   Frequency Min 4X/week    Precautions / Restrictions Precautions Precautions: Fall Restrictions Weight Bearing Restrictions: No   Pertinent Vitals/Pain Denied pain.        Mobility  Bed Mobility Bed Mobility: Supine to Sit;Sitting - Scoot to Edge of Bed Supine to Sit: 4: Min guard;With rails;HOB elevated Sitting - Scoot to Edge of Bed: 4: Min guard Details for Bed Mobility Assistance: pt utilizess rails to attempt to do as much as he can without A.   Transfers Transfers: Sit to Stand;Stand to Sit Sit to Stand: 4: Min assist;With upper extremity assist;From bed;From toilet Stand to Sit: 4: Min assist;With upper extremity assist;To toilet;To chair/3-in-1 Details for Transfer Assistance: cues for UE use and positioning of LEs.  pt heavily relies on UEs to A L LE 2/2 weakness.   Ambulation/Gait Ambulation/Gait Assistance: 3: Mod assist Ambulation Distance (Feet): 10  Feet (5) Assistive device: Rolling walker Ambulation/Gait Assistance Details: pt relies heavily on RW with L UE and LE fatigueing quickly.  pt needed 2 standing rest breaks during 10' amb.  L knee begins to give and near buckles with increased fatigue.   Gait Pattern: Step-through pattern;Decreased step length - right;Decreased stance time - left;Decreased stride length Stairs: No Wheelchair Mobility Wheelchair Mobility: No Modified Rankin (Stroke Patients Only) Pre-Morbid Rankin Score: No significant disability Modified Rankin: Moderately severe disability    Exercises     PT Diagnosis: Difficulty walking  PT Problem List: Decreased strength;Decreased activity tolerance;Decreased balance;Decreased mobility;Decreased coordination;Decreased knowledge of use of DME;Cardiopulmonary status limiting activity PT Treatment Interventions: DME instruction;Gait training;Stair training;Functional mobility training;Therapeutic activities;Therapeutic exercise;Balance training;Neuromuscular re-education;Patient/family education     PT Goals(Current goals can be found in the care plan section) Acute Rehab PT Goals Patient Stated Goal: Back to normal PT Goal Formulation: With patient Time For Goal Achievement: 05/06/13 Potential to Achieve Goals: Good  Visit Information  Last PT Received On: 04/22/13 Assistance Needed: +2 (safety and chair follow) History of Present Illness: pt presents with L sided weakness and numbness.  Given tpa with partial resolution of symptoms.         Prior [place] expects to be discharged to:: Inpatient rehab Additional Comments: pt's wife has to A her mother a few times per day, but pt's mother is able to provide some A.   Prior Function Level of Independence: Independent Communication Communication: No difficulties Dominant Hand:  Right    Cognition  Cognition Arousal/Alertness: Awake/alert Behavior During Therapy: WFL for tasks  assessed/performed Overall Cognitive Status: Within Functional Limits for tasks assessed    Extremity/Trunk Assessment Upper Extremity Assessment Upper Extremity Assessment: Defer to OT evaluation Lower Extremity Assessment Lower Extremity Assessment: RLE deficits/detail;LLE deficits/detail RLE Deficits / Details: Generally weak.  pt notes instability for >19month   LLE Deficits / Details: Hip/knee 4-/5 and ankle 3-/5.   LLE Sensation: decreased light touch LLE Coordination: decreased fine motor;decreased gross motor   Balance Balance Balance Assessed: Yes Static Standing Balance Static Standing - Balance Support: Bilateral upper extremity supported Static Standing - Level of Assistance: 5: Stand by assistance  End of Session PT - End of Session Equipment Utilized During Treatment: Gait belt Activity Tolerance: Patient tolerated treatment well Patient left: in chair;with call bell/phone within reach;with family/visitor present Nurse Communication: Mobility status  GP     RCatarina Hartshorn PPresidio12/05/2012, 8:46 AM

## 2013-04-22 NOTE — Evaluation (Addendum)
Occupational Therapy Evaluation Patient Details Name: Bryan Becker MRN: [id number] DOB: Mar 16, 1961 Today's Date: 04/22/2013 Time: 8882-8003 OT Time Calculation (min): 23 min  OT Assessment / Plan / Recommendation History of present illness pt presents with L sided weakness and numbness.  Given tpa with partial resolution of symptoms.     Clinical Impression   Pt presents with below problem list. Pt independent with ADLs, PTA. Pt will benefit from acute OT to increase independence prior to d/c.     OT Assessment  Patient needs continued OT Services    Follow Up Recommendations  CIR;Supervision/Assistance - 24 hour    Barriers to Discharge      Equipment Recommendations  3 in 1 bedside comode    Recommendations for Other Services Rehab consult  Frequency  Min 2X/week    Precautions / Restrictions Precautions Precautions: Fall Restrictions Weight Bearing Restrictions: No   Pertinent Vitals/Pain heachace and sore throat-nurse brought meds and popsicle. Pain in back and left leg and also under LUE (armpit area) when moving it. Repositioned. Pt O2 in 90's on RA towards end of session.     ADL  Grooming: Wash/dry face;Min guard Where Assessed - Grooming: Supported standing Lower Body Dressing: Minimal assistance Where Assessed - Lower Body Dressing: Unsupported sitting Toilet Transfer: Min Psychiatric nurse Method: Sit to Loss adjuster, chartered: Radiographer, therapeutic Method: Not assessed Equipment Used: Gait belt;Rolling walker Transfers/Ambulation Related to ADLs: Assisted with LLE with ambulation. Min guard/Min A for transfers. ADL Comments: Educated on dressing technique and also to sit to get LB clothing over feet and to stand in front of chair/bed with walker in front when pulling up LB clothing. Educated on energy conservation techniques as pt was so fatigued and says he usually is. Educated to be using LUE.  Educated on use of 3 in 1. Reports  fine motor tasks are difficult sometimes.    OT Diagnosis: Acute pain;Generalized weakness  OT Problem List: Decreased strength;Decreased activity tolerance;Impaired balance (sitting and/or standing);Decreased knowledge of use of DME or AE;Decreased knowledge of precautions;Pain;Impaired sensation;Decreased coordination OT Treatment Interventions: Self-care/ADL training;Therapeutic exercise;Neuromuscular education;DME and/or AE instruction;Therapeutic activities;Patient/family education;Balance training   OT Goals(Current goals can be found in the care plan section) Acute Rehab OT Goals Patient Stated Goal: go home OT Goal Formulation: With patient Time For Goal Achievement: 04/29/13 Potential to Achieve Goals: Good ADL Goals Pt Will Perform Grooming: with modified independence;standing Pt Will Perform Lower Body Bathing: with modified independence;sit to/from stand Pt Will Perform Lower Body Dressing: with modified independence;sit to/from stand Pt Will Transfer to Toilet: with modified independence;ambulating (3 in 1 over commode) Pt Will Perform Toileting - Clothing Manipulation and hygiene: with modified independence;sit to/from stand Pt Will Perform Tub/Shower Transfer: Tub transfer;Shower transfer;with supervision;ambulating;rolling walker (tub/shower equipment tbd) Additional ADL Goal #1: Pt will be independent with HEP of LUE.  Additional ADL Goal #2: Pt will independently verbalize 3/3 energy conservation techniques and demonstrate during ADLs.   Visit Information  Last OT Received On: 04/22/13 Assistance Needed: +2 (safety and chair follow) History of Present Illness: pt presents with L sided weakness and numbness.  Given tpa with partial resolution of symptoms.         Prior [place] expects to be discharged to:: Inpatient rehab Living Arrangements: Spouse/significant other Additional Comments: pt's wife (also has arthritis) has to A her  mother a few times per day, but pt's mother is able to provide some A.  Prior Function Level of Independence: Independent Communication Communication: No difficulties Dominant Hand: Right         Vision/Perception Vision - History Baseline Vision: Other (comment) (blurry at times) Patient Visual Report: No change from baseline Vision - Assessment Vision Assessment: Vision tested Visual Fields: No apparent deficits   Cognition  Cognition Arousal/Alertness: Awake/alert Behavior During Therapy: WFL for tasks assessed/performed Overall Cognitive Status: Within Functional Limits for tasks assessed    Extremity/Trunk Assessment Upper Extremity Assessment Upper Extremity Assessment: LUE deficits/detail LUE Deficits / Details: weaker grasp compared to Rt hand; AROM shoulder flexion approximately 90 degrees LUE Sensation: decreased light touch (at times) LUE Coordination: decreased fine motor Lower Extremity Assessment Lower Extremity Assessment: Defer to PT evaluation     Mobility Bed Mobility Bed Mobility: Supine to Sit;Sitting - Scoot to Edge of Bed Supine to Sit: 5: Supervision Sitting - Scoot to Edge of Bed: 5: Supervision Details for Bed Mobility Assistance: Pt used rails for supine to sit Transfers Transfers: Sit to Stand;Stand to Sit Sit to Stand: 4: Min guard;With upper extremity assist;From bed;From chair/3-in-1 Stand to Sit: 4: Min guard;4: Min assist;To chair/3-in-1 Details for Transfer Assistance: Cues for hand placement. Min A to control descent.     Exercise     Balance     End of Session OT - End of Session Equipment Utilized During Treatment: Gait belt;Rolling walker Activity Tolerance: Patient limited by fatigue Patient left: in chair;with call bell/phone within reach  [employer] OTR/L [phone] 04/22/2013, 4:05 PM

## 2013-04-22 NOTE — Progress Notes (Signed)
Stroke Team Progress Note  HISTORY Bryan Becker is a 52 y.o. male with a history of multiple medical problems including hypertension, diabetes, CHF, cirrhosis who presented to [facility]20/2013 with acute onset left-sided weakness. He was evaluated in the emergency department there and after consultation with Dr. Leonel Ramsay by phone, given the acute presentation of face arm and leg weakness felt a stroke was most likely diagnosis and he was administered TPA and transferred [facility] to the ICU.  SUBJECTIVE His cousin, Adella Hare is at the bedside. Pt also undergoing a 2D echo.  OBJECTIVE Most recent Vital Signs: Filed Vitals:   04/22/13 0007 04/22/13 0146 04/22/13 0601 04/22/13 0942  BP:  132/77 140/73 138/78  Pulse:  84 91 108  Temp: 97.6 F (36.4 C) 98 F (36.7 C) 98.3 F (36.8 C) 98.4 F (36.9 C)  TempSrc: Oral Oral Oral Oral  Resp:  17  18  Height:      Weight:      SpO2:  96% 100% 99%   CBG (last 3)   Recent Labs  04/22/13 0008 04/22/13 0344 04/22/13 0749  GLUCAP 198* 171* 215*    IV Fluid Intake:   . sodium chloride 50 mL/hr at 04/21/13 0516    MEDICATIONS  . aspirin  325 mg Oral Daily  . citalopram  40 mg Oral Daily  . furosemide  40 mg Oral BID  . gabapentin  400 mg Oral TID  . glimepiride  2 mg Oral Q breakfast  . insulin aspart  2-6 Units Subcutaneous Q4H  . Liraglutide  1.8 mg Subcutaneous Daily  . lisinopril  40 mg Oral BID  . LORazepam  1 mg Oral BID  . pantoprazole  40 mg Oral Daily  . tiotropium  18 mcg Inhalation Daily   PRN:  acetaminophen, acetaminophen, albuterol, guaiFENesin, labetalol, loperamide, oxyCODONE, senna-docusate  Diet:  Carb Control thin liquids Activity:  As tolerated DVT Prophylaxis:  SCD  CLINICALLY SIGNIFICANT STUDIES Basic Metabolic Panel:   Recent Labs Lab 04/20/13 1614 04/22/13 0545  NA 137 141  K 3.7 3.4*  CL 97 101  CO2 25 32  GLUCOSE 263* 192*  BUN 14 12  CREATININE 0.70 0.67   CALCIUM 9.4 8.8   Liver Function Tests:   Recent Labs Lab 04/20/13 1614 04/22/13 0545  AST 18 17  ALT 29 27  ALKPHOS 52 56  BILITOT 0.3 0.4  PROT 7.6 6.7  ALBUMIN 4.1 3.7   CBC:   Recent Labs Lab 04/20/13 1614 04/22/13 0545  WBC 7.8 8.1  NEUTROABS 4.3 5.3  HGB 14.9 14.9  HCT 43.3 43.3  MCV 84.1 85.4  PLT 201 197   Coagulation:   Recent Labs Lab 04/20/13 1614  LABPROT 12.7  INR 0.97   Cardiac Enzymes:   Recent Labs Lab 04/18/13 1643 04/20/13 1615  TROPONINI <0.30 <0.30   Urinalysis:   Recent Labs Lab 04/20/13 1630  COLORURINE YELLOW  LABSPEC 1.037*  PHURINE 6.0  GLUCOSEU >1000*  HGBUR NEGATIVE  BILIRUBINUR NEGATIVE  KETONESUR NEGATIVE  PROTEINUR NEGATIVE  UROBILINOGEN 0.2  NITRITE NEGATIVE  LEUKOCYTESUR NEGATIVE   Lipid Panel    Component Value Date/Time   CHOL 185 04/21/2013 0455   TRIG 364* 04/21/2013 0455   HDL 26* 04/21/2013 0455   CHOLHDL 7.1 04/21/2013 0455   VLDL 73* 04/21/2013 0455   LDLCALC 86 04/21/2013 0455   HgbA1C  Lab Results  Component Value Date   HGBA1C 10.0* 04/21/2013    Urine Drug Screen:  Component Value Date/Time   LABOPIA NONE DETECTED 04/20/2013 1630   COCAINSCRNUR NONE DETECTED 04/20/2013 1630   LABBENZ NONE DETECTED 04/20/2013 1630   AMPHETMU NONE DETECTED 04/20/2013 1630   THCU NONE DETECTED 04/20/2013 1630   LABBARB NONE DETECTED 04/20/2013 1630    Alcohol Level:   Recent Labs Lab 04/20/13 [place] <11    CT of the brain  04/20/2013   No acute intracranial abnormality  MRI of the brain  04/21/2013  1. No acute intracranial abnormality. Stable and negative non contrast MRI appearance of the brain.   MRA of the brain  04/21/2013    Fetal type bilateral PCA origins with otherwise diminutive posterior circulation and evidence of distal vertebral artery atherosclerosis. Distal left vertebral artery stenosis (distal to the PICA origin) may be hemodynamically significant.   2D Echocardiogram     Carotid Doppler  Left: 40-59% internal carotid artery stenosis, probably due to mild vessel tortuosity. Vertebral artery flow is retrograde. Right: 1-39% ICA stenosis. Vertebral artery flow is antegrade.  CXR  04/20/2013    No evidence of acute cardiopulmonary disease.    EKG  Normal sinus rhythm. Normal ECG  Therapy Recommendations CIR  Physical Exam General: The patient is alert and cooperative at the time of the examination. The patient is markedly obese. Respiratory: Lungs fields are clear. Cardiovascular: Regular rate and rhythm, no murmurs or rubs are noted. Abdominal: Abdomen is obese, nontender, positive bowel sounds. Skin: No significant peripheral edema is noted.  Neurologic Exam Mental status: The patient is oriented x 3. Cranial nerves: Facial symmetry is present. Speech is normal, no aphasia or dysarthria is noted. Extraocular movements are full. Visual fields are full. Motor: The patient has good strength in all 4 extremities.diminished fine finger movements on left and poor effort on left grip strength testing. Mild LLE drift but doubt effort Sensory examination:Soft touch sensation is symmetric on the face, arms, and legs. Coordination: The patient has good finger-nose-finger and heel-to-shin bilaterally. Gait and station: The gait was not tested. No pronator drift is seen. Reflexes: Deep tendon reflexes are symmetric, but are depressed.   ASSESSMENT [name] is a 52 y.o. male presenting with left-sided numbness, slight weakness. The patient was given full dose IV TPA, with good improvement in clinical symptoms. The patient is very close to his baseline. MRI negative for acute stroke. Dx: right brain stroke not seen on MRI vs non-organic cause of symptoms. The patient indicates that he was having issues with blood sugar control prior to admission. The patient was on aspirin prior to coming in the hospital. He is currently on aspirin 325 mg daily.   Diabetes,  HgbA1c 10.0  Obesity, Body mass index is 39.86 kg/(m^2).   Fibromyalgia  Hypertension  Nonalcoholic cirrhosis of the liver, liver biopsy done  Asthma, COPD  Dyslipidemia, LDL 86, no on statin prior to admission  Peptic ulcer disease  History of congestive heart failure  Renal calculi  Anxiety disorder  Headache, given oxycodone prn obstructive sleep apnea, on home bipap  Hospital day # 2  TREATMENT/PLAN  Change to plavix 75 mg daily for secondary stroke prevention  F/u 2-D echocardiogram       Rehab when bed available  Ongoing risk factor management  Burnetta Sabin, MSN, RN, ANVP-BC, ANP-BC, GNP-BC [facility] Pager: [phone] 04/22/2013 10:46 AM  I have personally obtained a history, examined the patient, evaluated imaging results, and formulated the assessment and plan of care. I agree with the  above. Antony Contras, MD

## 2013-04-22 NOTE — Progress Notes (Signed)
Pt. Has his home BIPAP set up at the bedside. Pt. Stated that he would place himself on BIPAP before going to bed. Pt. Was made aware to call RT if he needed assistance.

## 2013-04-22 NOTE — Progress Notes (Signed)
Echocardiogram 2D Echocardiogram has been performed.  Bryan Becker 04/22/2013, 11:30 AM

## 2013-04-22 NOTE — Evaluation (Addendum)
Speech Language Pathology Evaluation Patient Details Name: Bryan Becker MRN: [id number] DOB: 06/20/1960 Today's Date: 04/22/2013 Time: 8413-2440 SLP Time Calculation (min): 12 min  Problem List:  Patient Active Problem List   Diagnosis Date Noted  . Stroke 04/20/2013  . [facility]) 11/22/2012  . Nausea & vomiting 11/20/2012  . Fever 11/20/2012  . Diabetes mellitus 11/20/2012  . NASH (nonalcoholic steatohepatitis) 11/20/2012  . HTN (hypertension) 11/20/2012  . Abdominal pain, unspecified site 11/20/2012   Past Medical History:  Past Medical History  Diagnosis Date  . Diabetes mellitus   . Hypertension   . Pancreatitis   . Kidney stone   . CHF (congestive heart failure)   . Pneumonia   . 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 Neck pain   . Seasonal allergies   . Asthma   . Anxiety   . COPD (chronic obstructive pulmonary disease)   . Hyperlipidemia   . Gastric ulcer   . Fibromyalgia    Past Surgical History:  Past Surgical History  Procedure Laterality Date  . Cholecystectomy    . Multiple cysts removal-hip,wrist    . Upper gastrointestinal endoscopy    . Colonoscopy     HPI:  Bryan Becker is a 52 y.o. male with a history of multiple medical problems including hypertension, diabetes, CHF, cirrhosis who presented to [facility] 11/29 with acute onset left-sided weakness. He was evaluated in the emergency department there and after consultation by phone, given the acute presentation of face arm and leg weakness felt a stroke was most likely diagnosis and he was administered TPA and transferred to the ICU here at [facility]. MRI reveals no acute intracranial abnormality; however, orders received for SLE.    Assessment / Plan / Recommendation Clinical Impression  Patient presents with intact cognitive-linguistic abilities and functional oral ROM.  Oral mech. exam marked by trace left-sided lingual and buccal weakness; however,  functional due to fully intelligible speech and no reports of difficulty with diet toleration.  Given that initial symptoms have resolved no skilled SLP services are warranted at this time; patient in agreement.      SLP Assessment  Patient does not need any further Speech Lanaguage Pathology Services    Follow Up Recommendations  None            Pertinent Vitals/Pain Headache; RN aware and patient pre-medicated    SLP Evaluation Prior Functioning  Cognitive/Linguistic Baseline: Baseline deficits Baseline deficit details: memory deficits per patient report   Cognition  Overall Cognitive Status: Within Functional Limits for tasks assessed Orientation Level: Oriented X4 Memory: Appears intact (hospital course accurate; chart review confirmed) Awareness: Appears intact Problem Solving: Appears intact Safety/Judgment: Appears intact    Comprehension  Auditory Comprehension Overall Auditory Comprehension: Appears within functional limits for tasks assessed Visual Recognition/Discrimination Discrimination: Within Function Limits Reading Comprehension Reading Status: Not tested    Expression Expression Primary Mode of Expression: Verbal Verbal Expression Overall Verbal Expression: Appears within functional limits for tasks assessed Written Expression Written Expression: Not tested   Oral / Motor Oral Motor/Sensory Function Overall Oral Motor/Sensory Function: Appears within functional limits for tasks assessed Labial ROM: Within Functional Limits Labial Symmetry: Within Functional Limits Labial Strength: Reduced Labial Sensation: Within Functional Limits Lingual Symmetry: Within Functional Limits Lingual Strength: Reduced Lingual Sensation: Within Functional Limits Facial ROM: Within Functional Limits Facial Symmetry: Within Functional Limits Facial Strength: Within Functional Limits Facial Sensation: Within Functional Limits Velum: Within Functional Limits Mandible:  Within Functional Limits Motor Speech Overall Motor Speech: Appears within functional limits for tasks assessed   GO    Carmelia Roller., CCC-SLP [phone]  [place] 04/22/2013, 12:14 PM

## 2013-04-22 NOTE — Consult Note (Signed)
Physical Medicine and Rehabilitation Consult Reason for Consult: Question CVA Referring Physician: Dr. Leonie Man   HPI: Bryan Becker is a 52 y.o. right-handed male multi-medical with history of hypertension, diabetes mellitus with peripheral neuropathy, systolic congestive heart failure, nonalcoholic cirrhosis COPD and fibromyalgia. Patient independent prior to admission using a cane. Admitted 04/20/2013 with acute onset of left-sided weakness. Cranial CT scan negative. Patient did receive TPA. MRI of the brain with no acute intracranial abnormality identified. MRA of the head was unremarkable. Carotid Dopplers with left 40-59% ICA stenosis. Echocardiogram with ejection fraction of 65% no wall motion abnormalities and normal systolic function. Neurology services followup question right brain CVA not identified on MRI and placed on Plavix therapy for CVA prophylaxis. Patient had been on aspirin prior to admission. Patient with a regular consistency diet. Physical occupational therapy evaluations completed with recommendations for physical medicine rehabilitation consult to consider inpatient rehabilitation services   Review of Systems  Cardiovascular: Positive for leg swelling.  Gastrointestinal:       GERD  Musculoskeletal: Positive for back pain, myalgias and neck pain.  Psychiatric/Behavioral: Positive for depression.       Anxiety  All other systems reviewed and are negative.   Past Medical History  Diagnosis Date  . Diabetes mellitus   . Hypertension   . Pancreatitis   . Kidney stone   . CHF (congestive heart failure)   . Pneumonia   . 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 Neck pain   . Seasonal allergies   . Asthma   . Anxiety   . COPD (chronic obstructive pulmonary disease)   . Hyperlipidemia   . Gastric ulcer   . Fibromyalgia    Past Surgical History  Procedure Laterality Date  . Cholecystectomy    . Multiple cysts removal-hip,wrist    . Upper  gastrointestinal endoscopy    . Colonoscopy     Family History  Problem Relation Age of Onset  . Colon cancer Neg Hx   . Esophageal cancer Neg Hx   . Rectal cancer Neg Hx   . Skin cancer Father   . Lung cancer Paternal Grandfather   . Stomach cancer Maternal Grandfather   . Bone cancer Paternal Grandmother   . Diabetes Mother   . Diabetes Maternal Grandmother   . Heart disease Maternal Grandmother   . Heart disease Father   . Heart disease Paternal Grandfather   . Heart attack Paternal Grandmother    Social History:  reports that he quit smoking about 10 years ago. His smoking use included Cigarettes. He smoked 0.00 packs per day. He has never used smokeless tobacco. He reports that he does not drink alcohol or use illicit drugs. Allergies:  Allergies  Allergen Reactions  . Codeine Other (See Comments)    Made skin peel   Medications Prior to Admission  Medication Sig Dispense Refill  . albuterol (PROVENTIL HFA;VENTOLIN HFA) 108 (90 BASE) MCG/ACT inhaler Inhale 2 puffs into the lungs every 6 (six) hours as needed. For shortness of breath       . aspirin 81 MG tablet Take 81 mg by mouth daily.        . chlorpheniramine-HYDROcodone (TUSSIONEX PENNKINETIC ER) 10-8 MG/5ML LQCR Take 5 mLs by mouth every 12 (twelve) hours as needed for cough.  115 mL  0  . citalopram (CELEXA) 40 MG tablet Take 40 mg by mouth daily.        . cyanocobalamin (,VITAMIN B-12,) 1000 MCG/ML injection Inject 1,000  mcg into the muscle once a week.       . diphenoxylate-atropine (LOMOTIL) 2.5-0.025 MG per tablet Take 1 tablet by mouth 4 (four) times daily as needed for diarrhea or loose stools.  30 tablet  0  . donepezil (ARICEPT) 10 MG tablet Take 10 mg by mouth at bedtime.      . fluticasone (FLONASE) 50 MCG/ACT nasal spray Place 2 sprays into the nose daily.  16 g  0  . furosemide (LASIX) 40 MG tablet Take 40 mg by mouth 2 (two) times daily.        Marland Kitchen gabapentin (NEURONTIN) 400 MG capsule Take 400 mg by mouth  3 (three) times daily.      Marland Kitchen glimepiride (AMARYL) 4 MG tablet Take 2 mg by mouth daily with breakfast.       . Glucosamine 500 MG TABS Take 500 mg by mouth 2 (two) times daily.        . Liraglutide (VICTOZA) 18 MG/3ML SOLN Inject 1.8 mg into the skin daily.       Marland Kitchen lisinopril (PRINIVIL,ZESTRIL) 40 MG tablet Take 40 mg by mouth 2 (two) times daily.       Marland Kitchen LORazepam (ATIVAN) 1 MG tablet Take 1 mg by mouth 2 (two) times daily.        . Multiple Vitamins-Minerals (MULTIVITAMIN WITH MINERALS) tablet Take 2 tablets by mouth daily.      Marland Kitchen omeprazole (PRILOSEC) 20 MG capsule Take 20 mg by mouth daily.      . ondansetron (ZOFRAN ODT) 4 MG disintegrating tablet Take 1 tablet (4 mg total) by mouth every 8 (eight) hours as needed for nausea.  30 tablet  0  . Potassium Gluconate 595 MG CAPS Take 595 mg by mouth daily.       . pseudoephedrine (SUDAFED) 30 MG tablet Take 30 mg by mouth every 4 (four) hours as needed for congestion.       Marland Kitchen tiotropium (SPIRIVA) 18 MCG inhalation capsule Place 18 mcg into inhaler and inhale 2 (two) times daily as needed (wheezing).        Home: Home Living Family/patient expects to be discharged to:: Inpatient rehab Living Arrangements: Spouse/significant other Additional Comments: pt's wife has to A her mother a few times per day, but pt's mother is able to provide some A.    Functional History:   Functional Status:  Mobility: Bed Mobility Bed Mobility: Supine to Sit;Sitting - Scoot to Edge of Bed Supine to Sit: 4: Min guard;With rails;HOB elevated Sitting - Scoot to Edge of Bed: 4: Min guard Transfers Transfers: Sit to Stand;Stand to Sit Sit to Stand: 4: Min assist;With upper extremity assist;From bed;From toilet Stand to Sit: 4: Min assist;With upper extremity assist;To toilet;To chair/3-in-1 Ambulation/Gait Ambulation/Gait Assistance: 3: Mod assist Ambulation Distance (Feet): 10 Feet (5) Assistive device: Rolling walker Ambulation/Gait Assistance Details: pt  relies heavily on RW with L UE and LE fatigueing quickly.  pt needed 2 standing rest breaks during 10' amb.  L knee begins to give and near buckles with increased fatigue.   Gait Pattern: Step-through pattern;Decreased step length - right;Decreased stance time - left;Decreased stride length Stairs: No Wheelchair Mobility Wheelchair Mobility: No  ADL:    Cognition: Cognition Overall Cognitive Status: Within Functional Limits for tasks assessed Orientation Level: Oriented X4 Memory: Appears intact (hospital course accurate; chart review confirmed) Awareness: Appears intact Problem Solving: Appears intact Safety/Judgment: Appears intact Cognition Arousal/Alertness: Awake/alert Behavior During Therapy: WFL for tasks assessed/performed Overall Cognitive Status:  Within Functional Limits for tasks assessed  Blood pressure 138/78, pulse 108, temperature 98.4 F (36.9 C), temperature source Oral, resp. rate 18, height 5' 9"  (1.753 m), weight 122.5 kg (270 lb 1 oz), SpO2 99.00%. Physical Exam  Vitals reviewed. HENT:  Head: Normocephalic.  Eyes: EOM are normal.  Neck: Normal range of motion. Neck supple. No thyromegaly present.  Cardiovascular: Normal rate and regular rhythm.   Respiratory: Effort normal and breath sounds normal. No respiratory distress.  GI: Soft. Bowel sounds are normal. He exhibits no distension.  Neurological: He is alert.  Patient was anxious during exam. He did follow simple commands but decreased fine motor skills. He was oriented x3. LUE is grossly 4+/5 with some decrease in his fine motor skills. LLE is 3+ hf, 4/5 k3 and 3-4 at ankle but motor exam as a whole was inconsistent and effort fluctuated. Moved left side much better when distracted.   Skin: Skin is warm and dry.  Psychiatric: Judgment and thought content normal.    Results for orders placed during the hospital encounter of 04/20/13 (from the past 24 hour(s))  GLUCOSE, CAPILLARY     Status: Abnormal    Collection Time    04/21/13  4:47 PM      Result Value Range   Glucose-Capillary 199 (*) 70 - 99 mg/dL  GLUCOSE, CAPILLARY     Status: Abnormal   Collection Time    04/21/13  7:36 PM      Result Value Range   Glucose-Capillary 237 (*) 70 - 99 mg/dL  GLUCOSE, CAPILLARY     Status: Abnormal   Collection Time    04/21/13  9:50 PM      Result Value Range   Glucose-Capillary 244 (*) 70 - 99 mg/dL  GLUCOSE, CAPILLARY     Status: Abnormal   Collection Time    04/22/13 12:08 AM      Result Value Range   Glucose-Capillary 198 (*) 70 - 99 mg/dL  GLUCOSE, CAPILLARY     Status: Abnormal   Collection Time    04/22/13  3:44 AM      Result Value Range   Glucose-Capillary 171 (*) 70 - 99 mg/dL  CBC WITH DIFFERENTIAL     Status: None   Collection Time    04/22/13  5:45 AM      Result Value Range   WBC 8.1  4.0 - 10.5 K/uL   RBC 5.07  4.22 - 5.81 MIL/uL   Hemoglobin 14.9  13.0 - 17.0 g/dL   HCT 43.3  39.0 - 52.0 %   MCV 85.4  78.0 - 100.0 fL   MCH 29.4  26.0 - 34.0 pg   MCHC 34.4  30.0 - 36.0 g/dL   RDW 13.5  11.5 - 15.5 %   Platelets 197  150 - 400 K/uL   Neutrophils Relative % 66  43 - 77 %   Neutro Abs 5.3  1.7 - 7.7 K/uL   Lymphocytes Relative 24  12 - 46 %   Lymphs Abs 1.9  0.7 - 4.0 K/uL   Monocytes Relative 8  3 - 12 %   Monocytes Absolute 0.7  0.1 - 1.0 K/uL   Eosinophils Relative 2  0 - 5 %   Eosinophils Absolute 0.2  0.0 - 0.7 K/uL   Basophils Relative 0  0 - 1 %   Basophils Absolute 0.0  0.0 - 0.1 K/uL  COMPREHENSIVE METABOLIC PANEL     Status: Abnormal   Collection Time  04/22/13  5:45 AM      Result Value Range   Sodium 141  135 - 145 mEq/L   Potassium 3.4 (*) 3.5 - 5.1 mEq/L   Chloride 101  96 - 112 mEq/L   CO2 32  19 - 32 mEq/L   Glucose, Bld 192 (*) 70 - 99 mg/dL   BUN 12  6 - 23 mg/dL   Creatinine, Ser 0.67  0.50 - 1.35 mg/dL   Calcium 8.8  8.4 - 10.5 mg/dL   Total Protein 6.7  6.0 - 8.3 g/dL   Albumin 3.7  3.5 - 5.2 g/dL   AST 17  0 - 37 U/L   ALT 27  0  - 53 U/L   Alkaline Phosphatase 56  39 - 117 U/L   Total Bilirubin 0.4  0.3 - 1.2 mg/dL   GFR calc non Af Amer >90  >90 mL/min   GFR calc Af Amer >90  >90 mL/min  HEMOGLOBIN A1C     Status: Abnormal   Collection Time    04/22/13  5:45 AM      Result Value Range   Hemoglobin A1C 9.7 (*) <5.7 %   Mean Plasma Glucose 232 (*) <117 mg/dL  GLUCOSE, CAPILLARY     Status: Abnormal   Collection Time    04/22/13  7:49 AM      Result Value Range   Glucose-Capillary 215 (*) 70 - 99 mg/dL  GLUCOSE, CAPILLARY     Status: Abnormal   Collection Time    04/22/13 12:07 PM      Result Value Range   Glucose-Capillary 154 (*) 70 - 99 mg/dL   Ct Head Wo Contrast  04/20/2013   CLINICAL DATA:  Weakness. Code stroke. Onset of left-sided weakness 1 hr ago.  EXAM: CT HEAD WITHOUT CONTRAST  TECHNIQUE: Contiguous axial images were obtained from the base of the skull through the vertex without intravenous contrast.  COMPARISON:  Head CT 08/11/2011 and MRI brain 08/12/2011  FINDINGS: The appearance of the brain is stable. No acute intracranial abnormality is identified. Specifically, there is no evidence of acute intra or extra-axial hemorrhage, mass effect, mass lesion, hydrocephalus, or acute cortically based infarction. No focal vascular abnormality is identified on noncontrast head CT.  The skull is intact. There is some mucosal thickening of the right maxillary sinus. No air-fluid levels are seen in the sinuses. The mastoid air cells are clear. The soft tissues of the scalp and orbits are unremarkable.  IMPRESSION: No acute intracranial abnormality. I telephoned this report to Dr. Canary Brim at 4:28 pm on 04/20/2013.   Electronically Signed   By: Curlene Dolphin M.D.   On: 04/20/2013 16:28   Mr Brain Wo Contrast  04/21/2013   CLINICAL DATA:  52 year old male with acute onset left side weakness. Status post tPA. Initial encounter.  EXAM: MRI HEAD WITHOUT CONTRAST  MRA HEAD WITHOUT CONTRAST  TECHNIQUE: Multiplanar,  multiecho pulse sequences of the brain and surrounding structures were obtained without intravenous contrast. Angiographic images of the head were obtained using MRA technique without contrast.  COMPARISON:  Head CT 04/20/2013. [facility] brain MRI 08/12/2011.  FINDINGS: MRI HEAD FINDINGS  No restricted diffusion or evidence of acute infarction. Major intracranial vascular flow voids are stable.  Stable and normal for age gray and white matter signal.  No midline shift, mass effect, evidence of mass lesion, ventriculomegaly, extra-axial collection or acute intracranial hemorrhage. Cervicomedullary junction and pituitary are within normal limits. Negative visualized cervical  spine. Normal bone marrow signal.  Visualized orbit soft tissues are within normal limits. Mild to moderate paranasal sinus mucosal thickening, increased from prior MRI. Trace mastoid fluid is stable. Stable and negative nasopharynx. Negative scalp soft tissues, stable small cystic focus medial to the left TMJ which likely is degenerative.  MRA HEAD FINDINGS  Antegrade flow in the posterior circulation with diminutive codominant distal vertebral arteries. There is distal vertebral artery irregularity, more so on the left. Moderate stenosis of the distal left vertebral artery suggested on series 405, image 4. Both PICA origins are patent, the left is proximal to the area of irregularity. Patent vertebrobasilar junction. Diminutive basilar artery is patent without stenosis. Patent SCA origins. Fetal type bilateral PCA origins. Bilateral PCA branches are within normal limits.  Antegrade flow in both ICA siphons. No ICA stenosis. Ophthalmic and posterior communicating artery origins are within normal limits.  Normal carotid termini, MCA and ACA origins. Anterior communicating artery and visualized ACA branches are within normal limits. Visualized bilateral MCA branches are within normal limits.  IMPRESSION: 1. No acute intracranial  abnormality. Stable and negative non contrast MRI appearance of the brain. 2. Fetal type bilateral PCA origins with otherwise diminutive posterior circulation and evidence of distal vertebral artery atherosclerosis. Distal left vertebral artery stenosis (distal to the PICA origin) may be hemodynamically significant. 3. Otherwise negative intracranial MRA.   Electronically Signed   By: Lars Pinks M.D.   On: 04/21/2013 16:45   Dg Chest Port 1 View  04/20/2013   CLINICAL DATA:  52 year old male with left-sided weakness - CVA.  EXAM: PORTABLE CHEST - 1 VIEW  COMPARISON:  04/18/2013 and prior chest radiographs  FINDINGS: Upper limits normal heart size again noted.  Mild peribronchial thickening is stable.  There is no evidence of focal airspace disease, pulmonary edema, suspicious pulmonary nodule/mass, pleural effusion, or pneumothorax. No acute bony abnormalities are identified.  IMPRESSION: No evidence of acute cardiopulmonary disease.   Electronically Signed   By: Hassan Rowan M.D.   On: 04/20/2013 21:04   Mr Jodene Nam Head/brain Wo Cm  04/21/2013   CLINICAL DATA:  52 year old male with acute onset left side weakness. Status post tPA. Initial encounter.  EXAM: MRI HEAD WITHOUT CONTRAST  MRA HEAD WITHOUT CONTRAST  TECHNIQUE: Multiplanar, multiecho pulse sequences of the brain and surrounding structures were obtained without intravenous contrast. Angiographic images of the head were obtained using MRA technique without contrast.  COMPARISON:  Head CT 04/20/2013. [facility] brain MRI 08/12/2011.  FINDINGS: MRI HEAD FINDINGS  No restricted diffusion or evidence of acute infarction. Major intracranial vascular flow voids are stable.  Stable and normal for age gray and white matter signal.  No midline shift, mass effect, evidence of mass lesion, ventriculomegaly, extra-axial collection or acute intracranial hemorrhage. Cervicomedullary junction and pituitary are within normal limits. Negative visualized  cervical spine. Normal bone marrow signal.  Visualized orbit soft tissues are within normal limits. Mild to moderate paranasal sinus mucosal thickening, increased from prior MRI. Trace mastoid fluid is stable. Stable and negative nasopharynx. Negative scalp soft tissues, stable small cystic focus medial to the left TMJ which likely is degenerative.  MRA HEAD FINDINGS  Antegrade flow in the posterior circulation with diminutive codominant distal vertebral arteries. There is distal vertebral artery irregularity, more so on the left. Moderate stenosis of the distal left vertebral artery suggested on series 405, image 4. Both PICA origins are patent, the left is proximal to the area of irregularity. Patent vertebrobasilar junction.  Diminutive basilar artery is patent without stenosis. Patent SCA origins. Fetal type bilateral PCA origins. Bilateral PCA branches are within normal limits.  Antegrade flow in both ICA siphons. No ICA stenosis. Ophthalmic and posterior communicating artery origins are within normal limits.  Normal carotid termini, MCA and ACA origins. Anterior communicating artery and visualized ACA branches are within normal limits. Visualized bilateral MCA branches are within normal limits.  IMPRESSION: 1. No acute intracranial abnormality. Stable and negative non contrast MRI appearance of the brain. 2. Fetal type bilateral PCA origins with otherwise diminutive posterior circulation and evidence of distal vertebral artery atherosclerosis. Distal left vertebral artery stenosis (distal to the PICA origin) may be hemodynamically significant. 3. Otherwise negative intracranial MRA.   Electronically Signed   By: Lars Pinks M.D.   On: 04/21/2013 16:45    Assessment/Plan: Diagnosis: ?right CVA with rapidly improving motor and sensory symptoms. MRI normal. ?non-organic weakness 1. Does the need for close, 24 hr/day medical supervision in concert with the patient's rehab needs make it unreasonable for this  patient to be served in a less intensive setting? Potentially 2. Co-Morbidities requiring supervision/potential complications: dm, htn 3. Due to bladder management, bowel management, safety, skin/wound care, disease management, medication administration, pain management and patient education, does the patient require 24 hr/day rehab nursing? No and Potentially 4. Does the patient require coordinated care of a physician, rehab nurse, PT (1-2 hrs/day, 5 days/week) and OT (1-2 hrs/day, 5 days/week) to address physical and functional deficits in the context of the above medical diagnosis(es)? No and Potentially Addressing deficits in the following areas: balance, endurance, locomotion, strength, transferring, bowel/bladder control, bathing, dressing, grooming and toileting 5. Can the patient actively participate in an intensive therapy program of at least 3 hrs of therapy per day at least 5 days per week? Potentially 6. The potential for patient to make measurable gains while on inpatient rehab is fair 7. Anticipated functional outcomes upon discharge from inpatient rehab are ?mod I to supervision with PT, ?mod I to supervision with OT, n/a with SLP. 8. Estimated rehab length of stay to reach the above functional goals is: one week or less vs N/A 9. Does the patient have adequate social supports to accommodate these discharge functional goals? Potentially 10. Anticipated D/C setting: Home 11. Anticipated post D/C treatments: [facility] therapy 12. Overall Rehab/Functional Prognosis: good  RECOMMENDATIONS: This patient's condition is appropriate for continued rehabilitative care in the following setting: [place] most likely Patient has agreed to participate in recommended program. Yes and Potentially Note that insurance prior authorization may be required for reimbursement for recommended care.  Comment: His examination findings were inconsistent and appeared largely non-organic in nature. I encouraged the patient  that he should experience continued neurological improvement. Will follow up for progress tomorrow (he was already contact guard assist today). Probably can go home with supervision/ Home health PT  Meredith Staggers, MD, [facility]     04/22/2013

## 2013-04-22 NOTE — Progress Notes (Signed)
Rehab Admissions Coordinator Note:  Patient was screened by Cleatrice Burke for appropriateness for an Inpatient Acute Rehab Consult.  At this time, we are recommending Inpatient Rehab consult.  Cleatrice Burke 04/22/2013, 9:08 AM  I can be reached at [phone]

## 2013-04-22 NOTE — Progress Notes (Addendum)
RT Note:  Pt placed himself on home CPAP unit  at this time.  No issues.

## 2013-04-22 NOTE — Progress Notes (Signed)
Inpatient Diabetes Program Recommendations  AACE/ADA: New Consensus Statement on Inpatient Glycemic Control (2013)  Target Ranges:  Prepandial:   less than 140 mg/dL      Peak postprandial:   less than 180 mg/dL (1-2 hours)      Critically ill patients:  140 - 180 mg/dL   Inpatient Diabetes Program Recommendations Correction (SSI): Discontinue ICU hyperglycemia protocol and start moderate scale TID+HS scale per Glycemic Control order set HgbA1C: =9.7 will need follow up with primary care after discharge Thank you  Raoul Pitch BSN, RN,CDE Inpatient Diabetes Coordinator [phone] (team pager)

## 2013-04-23 DIAGNOSIS — I6330 Cerebral infarction due to thrombosis of unspecified cerebral artery: Secondary | ICD-10-CM

## 2013-04-23 LAB — GLUCOSE, CAPILLARY
Glucose-Capillary: 173 mg/dL — ABNORMAL HIGH (ref 70–99)
Glucose-Capillary: 185 mg/dL — ABNORMAL HIGH (ref 70–99)
Glucose-Capillary: 276 mg/dL — ABNORMAL HIGH (ref 70–99)
Glucose-Capillary: 357 mg/dL — ABNORMAL HIGH (ref 70–99)
Glucose-Capillary: 73 mg/dL (ref 70–99)

## 2013-04-23 MED ORDER — INSULIN ASPART 100 UNIT/ML ~~LOC~~ SOLN: 0.0000 [IU] | Freq: Every day | SUBCUTANEOUS | Status: DC

## 2013-04-23 MED ORDER — INSULIN ASPART 100 UNIT/ML ~~LOC~~ SOLN
0.0000 [IU] | Freq: Three times a day (TID) | SUBCUTANEOUS | Status: DC
  Administered 2013-04-23: 15 [IU] via SUBCUTANEOUS
  Administered 2013-04-23: 3 [IU] via SUBCUTANEOUS
  Administered 2013-04-24: 5 [IU] via SUBCUTANEOUS

## 2013-04-23 NOTE — Progress Notes (Signed)
Seen and agreed 04/23/2013 Jacqualyn Posey PTA [phone] pager [phone] office

## 2013-04-23 NOTE — Progress Notes (Signed)
Occupational Therapy Treatment Patient Details Name: Bryan Becker MRN: [id number] DOB: 1961-04-23 Today's Date: 04/23/2013 Time: 2694-8546 OT Time Calculation (min): 29 min  OT Assessment / Plan / Recommendation  History of present illness pt presents with L sided weakness and numbness.  Given tpa with partial resolution of symptoms.     OT comments  Pt progressing towards goals. Practiced tub transfer, LB dressing, grooming, and toilet transfer. Educated on energy conservation.   Follow Up Recommendations  CIR;Supervision/Assistance - 24 hour    Barriers to Discharge       Equipment Recommendations  3 in 1 bedside comode    Recommendations for Other Services Rehab consult  Frequency Min 2X/week   Progress towards OT Goals Progress towards OT goals: Progressing toward goals  Plan Discharge plan remains appropriate    Precautions / Restrictions Precautions Precautions: Fall Restrictions Weight Bearing Restrictions: No   Pertinent Vitals/Pain No pain reported.     ADL  Grooming: Wash/dry face;Set up;Supervision/safety Where Assessed - Grooming: Supported standing Lower Body Dressing: Min guard Where Assessed - Lower Body Dressing: Supported sit to Lobbyist: Magazine features editor Method: Sit to Loss adjuster, chartered: Regular height toilet;Grab bars Tub/Shower Transfer: Minimal assistance Tub/Shower Transfer Method: Therapist, art: Other (comment);Shower seat with back (also practiced stepping over) Equipment Used: Gait belt;Rolling walker Transfers/Ambulation Related to ADLs: Min guard for ambulation. Min guard for sit <> stand transfers. Min A to assist leg when stepping into tub. ADL Comments: Pt able to ambulate some in hallway today. Pt practiced tub transfer (stepping over as well as backing up to tub, sitting on chair, and swinging legs in). Educated on energy conservation techniques and try to incorporate in  session. Educated on long handled sponge and reacher that may be helpful to conserve energy. Discussed DME for tub and educated if pt uses other chair besides shower chair that wife put towel down to prevent chair from sliding. Educated on standing in front of chair/bed with walker in front when pulling up LB clothing.  Educated on dressing technique and pt practiced donning/doffing pants. Briefly reviewed use of squeeze ball for Lt hand. Pt breathing heavily and fatigued during session requiring rest breaks. Educated to sit to bathe.    OT Diagnosis:    OT Problem List:   OT Treatment Interventions:     OT Goals(current goals can now be found in the care plan section) Acute Rehab OT Goals Patient Stated Goal: go home OT Goal Formulation: With patient Time For Goal Achievement: 04/29/13 Potential to Achieve Goals: Good ADL Goals Pt Will Perform Grooming: with modified independence;standing Pt Will Perform Lower Body Bathing: with modified independence;sit to/from stand Pt Will Perform Upper Body Dressing: with modified independence;sitting Pt Will Perform Lower Body Dressing: with modified independence;sit to/from stand Pt Will Transfer to Toilet: with modified independence;ambulating;regular height toilet;grab bars Pt Will Perform Toileting - Clothing Manipulation and hygiene: with modified independence;sit to/from stand Pt Will Perform Tub/Shower Transfer: Tub transfer;Shower transfer;with supervision;ambulating;rolling walker Additional ADL Goal #1: Pt will be independent with HEP of LUE.  Additional ADL Goal #2: Pt will independently verbalize 3/3 energy conservation techniques and demonstrate during ADLs.   Visit Information  Last OT Received On: 04/23/13 Assistance Needed: +1 History of Present Illness: pt presents with L sided weakness and numbness.  Given tpa with partial resolution of symptoms.      Subjective Data      Prior Functioning       Cognition  Cognition Arousal/Alertness: Awake/alert Behavior During Therapy: WFL for tasks assessed/performed Overall Cognitive Status: Within Functional Limits for tasks assessed    Mobility  Bed Mobility Bed Mobility: Not assessed Transfers Transfers: Sit to Stand;Stand to Sit Sit to Stand: 4: Min guard;With upper extremity assist;From chair/3-in-1;From toilet Stand to Sit: 4: Min guard;With upper extremity assist;To chair/3-in-1 Details for Transfer Assistance: Min guard for safety.     Exercises      Balance     End of Session OT - End of Session Equipment Utilized During Treatment: Gait belt;Rolling walker Activity Tolerance: Patient tolerated treatment well;Patient limited by fatigue Patient left: in chair;with call bell/phone within reach  [place], [place] L  OTR/L [phone]  04/23/2013, 11:50 AM

## 2013-04-23 NOTE — Progress Notes (Signed)
Physical Therapy Treatment Patient Details Name: Bryan Becker MRN: [id number] DOB: [birth year] Today's Date: 04/23/2013 Time: 2595-6387 PT Time Calculation (min): 38 min  PT Assessment / Plan / Recommendation  History of Present Illness pt presents with L sided weakness and numbness.  Given tpa with partial resolution of symptoms.     PT Comments   Pt progressing greatly with mobility today as noted with ambulation however rest breaks required due to fatigue.  Pt motivated to participate with PT and verbalized that he would like to do CIR to get stronger prior to d/c home.  Pt has family/friends for support once at home. Pt would benefit from CIR to maximize functional I and increase safety once at home.  Follow Up Recommendations  CIR     Does the patient have the potential to tolerate intense rehabilitation     Barriers to Discharge        Equipment Recommendations  Rolling walker with 5" wheels    Recommendations for Other Services Rehab consult  Frequency Min 4X/week   Progress towards PT Goals Progress towards PT goals: Progressing toward goals  Plan Current plan remains appropriate    Precautions / Restrictions Precautions Precautions: Fall Restrictions Weight Bearing Restrictions: No   Pertinent Vitals/Pain no apparent distress     Mobility  Bed Mobility Bed Mobility: Rolling Left;Left Sidelying to Sit;Sitting - Scoot to Edge of Bed;Sit to Sidelying Left Rolling Left: 6: Modified independent (Device/Increase time) Left Sidelying to Sit: 6: Modified independent (Device/Increase time);HOB elevated Sitting - Scoot to Edge of Bed: 5: Supervision Sit to Sidelying Left: 5: Supervision Details for Bed Mobility Assistance: Pt demonstrated without rails today and HOB elevated ~30degrees Transfers Transfers: Sit to Stand;Stand to Sit Sit to Stand: 4: Min guard;With upper extremity assist;From bed Stand to Sit: 4: Min guard;With upper extremity assist;To  chair/3-in-1 Details for Transfer Assistance: Min guard for safety.  Ambulation/Gait Ambulation/Gait Assistance: 4: Min guard Ambulation Distance (Feet): 65 Feet Assistive device: Rolling walker Ambulation/Gait Assistance Details: 70' x3 and 100' x2 with seated rest breaks between due to fatigue. Pt able to pace self with education. Left genu recurvatum noted with cues to maintain slight flexion. Pt did well with RW maneuvering.  Gait Pattern: Step-through pattern;Decreased step length - right;Decreased stance time - left;Decreased stride length;Left genu recurvatum Gait velocity: decreased    Exercises     PT Diagnosis:    PT Problem List:   PT Treatment Interventions:     PT Goals (current goals can now be found in the care plan section) Acute Rehab PT Goals Patient Stated Goal: go home  Visit Information  Last PT Received On: 04/23/13 Assistance Needed: +1 History of Present Illness: pt presents with L sided weakness and numbness.  Given tpa with partial resolution of symptoms.      Subjective Data  Patient Stated Goal: go home   Cognition  Cognition Arousal/Alertness: Awake/alert Behavior During Therapy: WFL for tasks assessed/performed Overall Cognitive Status: Within Functional Limits for tasks assessed    Balance     End of Session PT - End of Session Equipment Utilized During Treatment: Gait belt Activity Tolerance: Patient tolerated treatment well Patient left: with call bell/phone within reach;with family/visitor present;in bed   GP     Bryan Becker, Bryan Becker, Bryan Becker 04/23/2013, 2:51 PM

## 2013-04-23 NOTE — Progress Notes (Signed)
Stroke Team Progress Note  HISTORY Bryan Becker is a 52 y.o. male with a history of multiple medical problems including hypertension, diabetes, CHF, cirrhosis who presented to [facility]2013 with acute onset left-sided weakness. He was evaluated in the emergency department there and after consultation with Dr. Leonel Ramsay by phone, given the acute presentation of face arm and leg weakness felt a stroke was most likely diagnosis and he was administered TPA and transferred [facility] to the ICU.  SUBJECTIVE No family at bedside. Pt expecting d.c to rehab today. Await bed availability.   OBJECTIVE Most recent Vital Signs: Filed Vitals:   04/22/13 2130 04/22/13 2140 04/23/13 0035 04/23/13 0445  BP: 114/54  98/48 130/58  Pulse: 117  98 93  Temp: 100.9 F (38.3 C)  98.4 F (36.9 C) 98.2 F (36.8 C)  TempSrc: Oral  Oral Oral  Resp: 18  20 20   Height:      Weight:      SpO2: 96% 97% 98% 99%   CBG (last 3)   Recent Labs  04/23/13 0008 04/23/13 0352 04/23/13 0847  GLUCAP 185* 170* 357*    IV Fluid Intake:   . sodium chloride 50 mL/hr at 04/21/13 0516    MEDICATIONS  . citalopram  40 mg Oral Daily  . clopidogrel  75 mg Oral Q breakfast  . furosemide  40 mg Oral BID  . gabapentin  400 mg Oral TID  . glimepiride  2 mg Oral Q breakfast  . insulin aspart  0-15 Units Subcutaneous TID WC  . insulin aspart  0-5 Units Subcutaneous QHS  . Liraglutide  1.8 mg Subcutaneous Daily  . lisinopril  40 mg Oral BID  . LORazepam  1 mg Oral BID  . pantoprazole  40 mg Oral Daily  . tiotropium  18 mcg Inhalation Daily   PRN:  acetaminophen, acetaminophen, albuterol, guaiFENesin, labetalol, loperamide, oxyCODONE, senna-docusate  Diet:  Carb Control thin liquids Activity:  As tolerated DVT Prophylaxis:  SCD  CLINICALLY SIGNIFICANT STUDIES Basic Metabolic Panel:   Recent Labs Lab 04/20/13 1614 04/22/13 0545  NA 137 141  K 3.7 3.4*  CL 97 101  CO2 25 32   GLUCOSE 263* 192*  BUN 14 12  CREATININE 0.70 0.67  CALCIUM 9.4 8.8   Liver Function Tests:   Recent Labs Lab 04/20/13 1614 04/22/13 0545  AST 18 17  ALT 29 27  ALKPHOS 52 56  BILITOT 0.3 0.4  PROT 7.6 6.7  ALBUMIN 4.1 3.7   CBC:   Recent Labs Lab 04/20/13 1614 04/22/13 0545  WBC 7.8 8.1  NEUTROABS 4.3 5.3  HGB 14.9 14.9  HCT 43.3 43.3  MCV 84.1 85.4  PLT 201 197   Coagulation:   Recent Labs Lab 04/20/13 1614  LABPROT 12.7  INR 0.97   Cardiac Enzymes:   Recent Labs Lab 04/18/13 1643 04/20/13 1615  TROPONINI <0.30 <0.30   Urinalysis:   Recent Labs Lab 04/20/13 1630  COLORURINE YELLOW  LABSPEC 1.037*  PHURINE 6.0  GLUCOSEU >1000*  HGBUR NEGATIVE  BILIRUBINUR NEGATIVE  KETONESUR NEGATIVE  PROTEINUR NEGATIVE  UROBILINOGEN 0.2  NITRITE NEGATIVE  LEUKOCYTESUR NEGATIVE   Lipid Panel    Component Value Date/Time   CHOL 185 04/21/2013 0455   TRIG 364* 04/21/2013 0455   HDL 26* 04/21/2013 0455   CHOLHDL 7.1 04/21/2013 0455   VLDL 73* 04/21/2013 0455   LDLCALC 86 04/21/2013 0455   HgbA1C  Lab Results  Component Value Date   HGBA1C  9.7* 04/22/2013    Urine Drug Screen:     Component Value Date/Time   LABOPIA NONE DETECTED 04/20/2013 1630   COCAINSCRNUR NONE DETECTED 04/20/2013 1630   LABBENZ NONE DETECTED 04/20/2013 1630   AMPHETMU NONE DETECTED 04/20/2013 1630   THCU NONE DETECTED 04/20/2013 1630   LABBARB NONE DETECTED 04/20/2013 1630    Alcohol Level:   Recent Labs Lab 04/20/13 [place] <11    CT of the brain  04/20/2013   No acute intracranial abnormality  MRI of the brain  04/21/2013  1. No acute intracranial abnormality. Stable and negative non contrast MRI appearance of the brain.   MRA of the brain  04/21/2013    Fetal type bilateral PCA origins with otherwise diminutive posterior circulation and evidence of distal vertebral artery atherosclerosis. Distal left vertebral artery stenosis (distal to the PICA origin) may  be hemodynamically significant.   2D Echocardiogram  EF 60-65% with no source of embolus.   Carotid Doppler  Left: 40-59% internal carotid artery stenosis, probably due to mild vessel tortuosity. Vertebral artery flow is retrograde. Right: 1-39% ICA stenosis. Vertebral artery flow is antegrade.  CXR  04/20/2013    No evidence of acute cardiopulmonary disease.   EKG  Normal sinus rhythm. Normal ECG  Therapy Recommendations CIR  Physical Exam General: The patient is alert and cooperative at the time of the examination. The patient is markedly obese. Respiratory: Lungs fields are clear. Cardiovascular: Regular rate and rhythm, no murmurs or rubs are noted. Abdominal: Abdomen is obese, nontender, positive bowel sounds. Skin: No significant peripheral edema is noted.  Neurologic Exam Mental status: The patient is oriented x 3. Cranial nerves: Facial symmetry is present. Speech is normal, no aphasia or dysarthria is noted. Extraocular movements are full. Visual fields are full. Motor: The patient has good strength in all 4 extremities.diminished fine finger movements on left and poor effort on left grip strength testing. Mild LLE drift but doubt effort Sensory examination:Soft touch sensation is symmetric on the face, arms, and legs. Coordination: The patient has good finger-nose-finger and heel-to-shin bilaterally. Gait and station: The gait was not tested. No pronator drift is seen. Reflexes: Deep tendon reflexes are symmetric, but are depressed.   ASSESSMENT [name] is a 52 y.o. male presenting with left-sided numbness, slight weakness. The patient was given full dose IV TPA, with good improvement in clinical symptoms. The patient is very close to his baseline. MRI negative for acute stroke. Dx: right brain stroke not seen on MRI vs non-organic cause of symptoms. The patient indicates that he was having issues with blood sugar control prior to admission. The patient was on  aspirin prior to coming in the hospital. He is currently on plavix 75 mg daily.   Diabetes, HgbA1c 10.0  Obesity, Body mass index is 39.86 kg/(m^2).   Fibromyalgia  Hypertension  Nonalcoholic cirrhosis of the liver, liver biopsy done  Asthma, COPD  Dyslipidemia, LDL 86, no on statin prior to admission  Peptic ulcer disease  History of congestive heart failure  Renal calculi  Anxiety disorder  Headache, given oxycodone prn obstructive sleep apnea, on home cpap  Hospital day # 3  TREATMENT/PLAN  Continue plavix 75 mg daily for secondary stroke prevention  Rehab when bed available  Discontinue ICU hyperglycemia protocol and start moderate scale TID & HS  Ongoing risk factor management  Burnetta Sabin, MSN, RN, ANVP-BC, ANP-BC, GNP-BC [facility] Pager: [phone] 04/23/2013 10:20 AM  I have personally obtained  a history, examined the patient, evaluated imaging results, and formulated the assessment and plan of care. I agree with the above.  Antony Contras, MD

## 2013-04-24 ENCOUNTER — Encounter (HOSPITAL_COMMUNITY): Payer: Self-pay | Admitting: Neurology

## 2013-04-24 DIAGNOSIS — J449 Chronic obstructive pulmonary disease, unspecified: Secondary | ICD-10-CM | POA: Diagnosis present

## 2013-04-24 DIAGNOSIS — R519 Headache, unspecified: Secondary | ICD-10-CM | POA: Diagnosis present

## 2013-04-24 DIAGNOSIS — G4733 Obstructive sleep apnea (adult) (pediatric): Secondary | ICD-10-CM

## 2013-04-24 DIAGNOSIS — R51 Headache: Secondary | ICD-10-CM | POA: Diagnosis present

## 2013-04-24 DIAGNOSIS — Z79899 Other long term (current) drug therapy: Secondary | ICD-10-CM

## 2013-04-24 DIAGNOSIS — M797 Fibromyalgia: Secondary | ICD-10-CM | POA: Diagnosis present

## 2013-04-24 DIAGNOSIS — Z9282 Status post administration of tPA (rtPA) in a different facility within the last 24 hours prior to admission to current facility: Secondary | ICD-10-CM

## 2013-04-24 DIAGNOSIS — E782 Mixed hyperlipidemia: Secondary | ICD-10-CM | POA: Diagnosis present

## 2013-04-24 HISTORY — DX: Obstructive sleep apnea (adult) (pediatric): G47.33

## 2013-04-24 LAB — GLUCOSE, CAPILLARY
Glucose-Capillary: 247 mg/dL — ABNORMAL HIGH (ref 70–99)
Glucose-Capillary: 156 mg/dL — ABNORMAL HIGH (ref 70–99)

## 2013-04-24 MED ORDER — LORATADINE 10 MG PO TABS
10.0000 mg | ORAL_TABLET | Freq: Every day | ORAL | Status: DC
  Administered 2013-04-24: 10 mg via ORAL
  Filled 2013-04-24: qty 1

## 2013-04-24 MED ORDER — CLOPIDOGREL BISULFATE 75 MG PO TABS: 75.0000 mg | ORAL_TABLET | Freq: Every day | ORAL | Status: DC

## 2013-04-24 NOTE — Progress Notes (Signed)
Pt has his home CPAP and will put himself on when ready.

## 2013-04-24 NOTE — Progress Notes (Signed)
Stroke Team Progress Note  HISTORY Bryan Becker is a 52 y.o. male with a history of multiple medical problems including hypertension, diabetes, CHF, cirrhosis who presented to [facility] 04/20/2013 with acute onset left-sided weakness. He was evaluated in the emergency department there and after consultation with Dr. Leonel Ramsay by phone, given the acute presentation of face arm and leg weakness felt a stroke was most likely diagnosis and he was administered TPA and transferred [facility] to the ICU.  SUBJECTIVE Patient feels better. Wants to do home therapy instead of CIR. Ready to go home today, feels like he is "stable".  OBJECTIVE Most recent Vital Signs: Filed Vitals:   04/23/13 1813 04/23/13 2050 04/24/13 0025 04/24/13 0540  BP: 142/64 146/67 107/50 116/69  Pulse: 96 95 71 70  Temp: 98.5 F (36.9 C) 98.7 F (37.1 C) 97.2 F (36.2 C) 97.6 F (36.4 C)  TempSrc: Oral Oral Oral Oral  Resp: 19 18 20 20   Height:      Weight:      SpO2: 99% 98% 98% 99%   CBG (last 3)   Recent Labs  04/23/13 1625 04/23/13 2300 04/24/13 0636  GLUCAP 161* 276* 247*    IV Fluid Intake:   . sodium chloride 50 mL/hr at 04/21/13 0516    MEDICATIONS  . citalopram  40 mg Oral Daily  . clopidogrel  75 mg Oral Q breakfast  . furosemide  40 mg Oral BID  . gabapentin  400 mg Oral TID  . glimepiride  2 mg Oral Q breakfast  . insulin aspart  0-15 Units Subcutaneous TID WC  . insulin aspart  0-5 Units Subcutaneous QHS  . Liraglutide  1.8 mg Subcutaneous Daily  . lisinopril  40 mg Oral BID  . loratadine  10 mg Oral Daily  . LORazepam  1 mg Oral BID  . pantoprazole  40 mg Oral Daily  . tiotropium  18 mcg Inhalation Daily   PRN:  acetaminophen, acetaminophen, albuterol, guaiFENesin, labetalol, loperamide, oxyCODONE, senna-docusate  Diet:  Carb Control thin liquids Activity:  As tolerated DVT Prophylaxis:  SCD  CLINICALLY SIGNIFICANT STUDIES Basic Metabolic Panel:   Recent  Labs Lab 04/20/13 1614 04/22/13 0545  NA 137 141  K 3.7 3.4*  CL 97 101  CO2 25 32  GLUCOSE 263* 192*  BUN 14 12  CREATININE 0.70 0.67  CALCIUM 9.4 8.8   Liver Function Tests:   Recent Labs Lab 04/20/13 1614 04/22/13 0545  AST 18 17  ALT 29 27  ALKPHOS 52 56  BILITOT 0.3 0.4  PROT 7.6 6.7  ALBUMIN 4.1 3.7   CBC:   Recent Labs Lab 04/20/13 1614 04/22/13 0545  WBC 7.8 8.1  NEUTROABS 4.3 5.3  HGB 14.9 14.9  HCT 43.3 43.3  MCV 84.1 85.4  PLT 201 197   Coagulation:   Recent Labs Lab 04/20/13 1614  LABPROT 12.7  INR 0.97   Cardiac Enzymes:   Recent Labs Lab 04/18/13 1643 04/20/13 1615  TROPONINI <0.30 <0.30   Urinalysis:   Recent Labs Lab 04/20/13 1630  COLORURINE YELLOW  LABSPEC 1.037*  PHURINE 6.0  GLUCOSEU >1000*  HGBUR NEGATIVE  BILIRUBINUR NEGATIVE  KETONESUR NEGATIVE  PROTEINUR NEGATIVE  UROBILINOGEN 0.2  NITRITE NEGATIVE  LEUKOCYTESUR NEGATIVE   Lipid Panel    Component Value Date/Time   CHOL 185 04/21/2013 0455   TRIG 364* 04/21/2013 0455   HDL 26* 04/21/2013 0455   CHOLHDL 7.1 04/21/2013 0455   VLDL 73* 04/21/2013 0455  LDLCALC 86 04/21/2013 0455   HgbA1C  Lab Results  Component Value Date   HGBA1C 9.7* 04/22/2013    Urine Drug Screen:     Component Value Date/Time   LABOPIA NONE DETECTED 04/20/2013 1630   COCAINSCRNUR NONE DETECTED 04/20/2013 1630   LABBENZ NONE DETECTED 04/20/2013 1630   AMPHETMU NONE DETECTED 04/20/2013 1630   THCU NONE DETECTED 04/20/2013 1630   LABBARB NONE DETECTED 04/20/2013 1630    Alcohol Level:   Recent Labs Lab 04/20/13 1614  ETH <11    CT of the brain  04/20/2013   No acute intracranial abnormality  MRI of the brain  04/21/2013  1. No acute intracranial abnormality. Stable and negative non contrast MRI appearance of the brain.   MRA of the brain  04/21/2013    Fetal type bilateral PCA origins with otherwise diminutive posterior circulation and evidence of distal vertebral  artery atherosclerosis. Distal left vertebral artery stenosis (distal to the PICA origin) may be hemodynamically significant.   2D Echocardiogram  EF 60-65% with no source of embolus.   Carotid Doppler  Left: 40-59% internal carotid artery stenosis, probably due to mild vessel tortuosity. Vertebral artery flow is retrograde. Right: 1-39% ICA stenosis. Vertebral artery flow is antegrade.  CXR  04/20/2013    No evidence of acute cardiopulmonary disease.   EKG  Normal sinus rhythm. Normal ECG  Therapy Recommendations CIR  Physical Exam General: The patient is alert and cooperative at the time of the examination. The patient is markedly obese. Respiratory: Lungs fields are clear. Cardiovascular: Regular rate and rhythm, no murmurs or rubs are noted. Abdominal: Abdomen is obese, nontender, positive bowel sounds. Skin: No significant peripheral edema is noted.  Neurologic Exam Mental status: The patient is oriented x 3. Cranial nerves: Facial symmetry is present. Speech is normal, no aphasia or dysarthria is noted. Extraocular movements are full. Visual fields are full. Motor: The patient has good strength in all 4 extremities.diminished fine finger movements on left and poor effort on left grip strength testing. Mild LLE drift but doubt effort Sensory examination:Soft touch sensation is symmetric on the face, arms, and legs. Coordination: The patient has good finger-nose-finger and heel-to-shin bilaterally. Gait and station: The gait was not tested. No pronator drift is seen. Reflexes: Deep tendon reflexes are symmetric, but are depressed.   ASSESSMENT [name] is a 52 y.o. male presenting with left-sided numbness, slight weakness. The patient was given full dose IV TPA, with good improvement in clinical symptoms. The patient is very close to his baseline. MRI negative for acute stroke. Dx: right brain stroke not seen on MRI vs non-organic cause of symptoms. The patient indicates  that he was having issues with blood sugar control prior to admission. The patient was on aspirin prior to coming in the hospital. He is currently on plavix 75 mg daily.   Diabetes, HgbA1c 10.0  Obesity, Body mass index is 39.86 kg/(m^2).   Fibromyalgia  Hypertension  Nonalcoholic cirrhosis of the liver, liver biopsy done  Asthma, COPD  Dyslipidemia, LDL 86, no on statin prior to admission, goal < 70 in diabetic, patient unable to tolerate statin due to liver cirrhosis. Continue low carbohydrate diet.  Peptic ulcer disease  History of congestive heart failure  Renal calculi  Anxiety disorder  Headache, given oxycodone prn obstructive sleep apnea, on home cpap  Hospital day # 4  TREATMENT/PLAN  Continue plavix 75 mg daily for secondary stroke prevention  Rehab when bed available-however, patient has good family  support and wants to go home with home health therapy. Will order HHRN/PT/OT.  Insulin therapy for diabetes management, modificed carb diet.  Ongoing risk factor management  Plan for patient follow up with Dr. Leonie Man in 2 months in stroke clinic.  Regenia Skeeter. Owens Shark, [facility], [place], [facility] Pager: [phone] 04/24/2013 2:44 PM  I have personally obtained a history, examined the patient, evaluated imaging results, and formulated the assessment and plan of care. I agree with the above. Antony Contras, MD

## 2013-04-24 NOTE — Progress Notes (Signed)
Inpatient Diabetes Program Recommendations  AACE/ADA: New Consensus Statement on Inpatient Glycemic Control (2013)  Target Ranges:  Prepandial:   less than 140 mg/dL      Peak postprandial:   less than 180 mg/dL (1-2 hours)      Critically ill patients:  140 - 180 mg/dL  Results for Bryan Becker, Bryan Becker (MRN [id number]) as of 04/24/2013 12:54  Ref. Range 04/23/2013 11:43 04/23/2013 15:52 04/23/2013 16:25 04/23/2013 23:00 04/24/2013 06:36  Glucose-Capillary Latest Range: 70-99 mg/dL 73 173 (H) 161 (H) 276 (H) 247 (H)   Inpatient Diabetes Program Recommendations Insulin - Basal: consider adding low dose basal  Correction (SSI): . Oral Agents: discontinue Amaryl while hospitalized  HgbA1C: =9.7 will need follow up with primary care after discharge  Thank you  Raoul Pitch BSN, RN,CDE Inpatient Diabetes Coordinator [phone] (team pager)

## 2013-04-24 NOTE — Progress Notes (Signed)
UR complete.  Anuar Walgren RN, MSN 

## 2013-04-24 NOTE — Progress Notes (Signed)
Physical Therapy Treatment Patient Details Name: Bryan Becker MRN: [id number] DOB: Aug 21, 1960 Today's Date: 04/24/2013 Time: 6503-5465 PT Time Calculation (min): 29 min  PT Assessment / Plan / Recommendation  History of Present Illness pt presents with L sided weakness and numbness.  Given tpa with partial resolution of symptoms.     PT Comments   Pt progressing well with ambulation and stair training today. Upon arrival into room pt stated that he was going home today between 1-3pm and that the paperwork was being completed by staff rather than going to CIR.  Pt able to ambulate household distances and negotiate stairs and opening doors; pt demonstrated good safety awareness today and pacing. Pt will have 24hr care from family. HH PT recommended to increase LLE strength, maximize functional I and to increase mobility.   Follow Up Recommendations  Home health PT     Does the patient have the potential to tolerate intense rehabilitation     Barriers to Discharge        Equipment Recommendations  Rolling walker with 5" wheels    Recommendations for Other Services    Frequency Min 4X/week   Progress towards PT Goals Progress towards PT goals: Progressing toward goals  Plan Discharge plan needs to be updated    Precautions / Restrictions Precautions Precautions: Fall Restrictions Weight Bearing Restrictions: No   Pertinent Vitals/Pain no apparent distress     Mobility  Bed Mobility Bed Mobility: Not assessed Details for Bed Mobility Assistance: pt receieved/returned to recliner Transfers Transfers: Sit to Stand;Stand to Sit Sit to Stand: With upper extremity assist;From chair/3-in-1;5: Supervision Stand to Sit: 5: Supervision;To chair/3-in-1;With upper extremity assist Details for Transfer Assistance: supervision for safety; no cues required  Ambulation/Gait Ambulation/Gait Assistance: 4: Min guard Ambulation Distance (Feet): 100 Feet Ambulation/Gait Assistance Details:  100' x4 with seated rest breaks between due to fatigue; Pt demonstrated pacing today with seated and standing rest breaks. L genu recurvatum improved from yesterday due to pt carryover of vc's. Min guard due to LLE instability; pt/family educated and verbalized understanding of having family present with mobility at home for safety. Gait Pattern: Step-through pattern;Decreased step length - right;Decreased stance time - left;Decreased stride length;Left genu recurvatum Gait velocity: decreased Stairs: Yes Stairs Assistance: 4: Min assist Stairs Assistance Details (indicate cue type and reason): vc's and education to pt and family for safest technique. min assist to steady pt when coming down first stair however pt did not require further physical Assist. Pt/family educated and verbalized understanding of min guard with stairs for safety. Stair Management Technique: Two rails;Forwards;Step to pattern Number of Stairs: 5    Exercises     PT Diagnosis:    PT Problem List:   PT Treatment Interventions:     PT Goals (current goals can now be found in the care plan section)    Visit Information  Last PT Received On: 04/24/13 Assistance Needed: +1 History of Present Illness: pt presents with L sided weakness and numbness.  Given tpa with partial resolution of symptoms.      Subjective Data      Cognition  Cognition Arousal/Alertness: Awake/alert Behavior During Therapy: WFL for tasks assessed/performed Overall Cognitive Status: Within Functional Limits for tasks assessed    Balance     End of Session PT - End of Session Equipment Utilized During Treatment: Gait belt Activity Tolerance: Patient tolerated treatment well Patient left: with call bell/phone within reach;with family/visitor present;in bed Nurse Communication: Mobility status   GP  Bryan Becker, Bryan Becker 04/24/2013, 3:02 PM

## 2013-04-24 NOTE — Discharge Summary (Signed)
Stroke Discharge Summary  Patient ID: Bryan Becker   MRN: [id number]      DOB: June 17, 1960  Date of Admission: 04/20/2013 Date of Discharge: 04/24/2013  Attending Physician:  Suzzanne Cloud, MD, Stroke MD  Consulting Physician(s):   Treatment Team:  [facility] Stroke, MD rehabilitation medicine  Patient's PCP:  Harvie Junior, MD  Discharge Diagnoses:  Left hemiparesis and numbness due to suspected small right brain infarct not visualized on MRI versus nonorganic etiology. Treated with IV TPA without complications and good improvement. Active Problems:   Diabetes mellitus   NASH (nonalcoholic steatohepatitis)   HTN (hypertension)   Stroke   Morbid obesity   COPD with asthma   Fibromyalgia   Dyslipidemia, goal LDL below 70   Headache(784.0)   OSA (obstructive sleep apnea)   Encounter for long-term (current) use of high-risk medication   S/P administration of tPA (rtPA) in a different facility within the last 24 hours prior to admission to current facility  BMI: Body mass index is 39.86 kg/(m^2).  Past Medical History  Diagnosis Date  . Diabetes mellitus   . Hypertension   . Pancreatitis   . Kidney stone   . CHF (congestive heart failure)   . Pneumonia   . 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 Neck pain   . Seasonal allergies   . Asthma   . Anxiety   . COPD (chronic obstructive pulmonary disease)   . Hyperlipidemia   . Gastric ulcer   . Fibromyalgia   . OSA (obstructive sleep apnea) 04/24/2013   Past Surgical History  Procedure Laterality Date  . Cholecystectomy    . Multiple cysts removal-hip,wrist    . Upper gastrointestinal endoscopy    . Colonoscopy        Medication List    STOP taking these medications       aspirin 81 MG tablet     chlorpheniramine-HYDROcodone 10-8 MG/5ML Lqcr  Commonly known as:  TUSSIONEX PENNKINETIC ER     diphenoxylate-atropine 2.5-0.025 MG per tablet  Commonly known as:  LOMOTIL     Glucosamine 500 MG Tabs      multivitamin with minerals tablet     pseudoephedrine 30 MG tablet  Commonly known as:  SUDAFED      TAKE these medications       albuterol 108 (90 BASE) MCG/ACT inhaler  Commonly known as:  PROVENTIL HFA;VENTOLIN HFA  Inhale 2 puffs into the lungs every 6 (six) hours as needed. For shortness of breath     citalopram 40 MG tablet  Commonly known as:  CELEXA  Take 40 mg by mouth daily.     clopidogrel 75 MG tablet  Commonly known as:  PLAVIX  Take 1 tablet (75 mg total) by mouth daily with breakfast.     cyanocobalamin 1000 MCG/ML injection  Commonly known as:  (VITAMIN B-12)  Inject 1,000 mcg into the muscle once a week.     donepezil 10 MG tablet  Commonly known as:  ARICEPT  Take 10 mg by mouth at bedtime.     fluticasone 50 MCG/ACT nasal spray  Commonly known as:  FLONASE  Place 2 sprays into the nose daily.     furosemide 40 MG tablet  Commonly known as:  LASIX  Take 40 mg by mouth 2 (two) times daily.     gabapentin 400 MG capsule  Commonly known as:  NEURONTIN  Take 400 mg by mouth 3 (three) times daily.  glimepiride 4 MG tablet  Commonly known as:  AMARYL  Take 2 mg by mouth daily with breakfast.     lisinopril 40 MG tablet  Commonly known as:  PRINIVIL,ZESTRIL  Take 40 mg by mouth 2 (two) times daily.     LORazepam 1 MG tablet  Commonly known as:  ATIVAN  Take 1 mg by mouth 2 (two) times daily.     omeprazole 20 MG capsule  Commonly known as:  PRILOSEC  Take 20 mg by mouth daily.     ondansetron 4 MG disintegrating tablet  Commonly known as:  ZOFRAN ODT  Take 1 tablet (4 mg total) by mouth every 8 (eight) hours as needed for nausea.     Potassium Gluconate 595 MG Caps  Take 595 mg by mouth daily.     tiotropium 18 MCG inhalation capsule  Commonly known as:  SPIRIVA  Place 18 mcg into inhaler and inhale 2 (two) times daily as needed (wheezing).     VICTOZA 18 MG/3ML Soln injection  Generic drug:  Liraglutide  Inject 1.8 mg into the  skin daily.        LABORATORY STUDIES CBC    Component Value Date/Time   WBC 8.1 04/22/2013 0545   RBC 5.07 04/22/2013 0545   HGB 14.9 04/22/2013 0545   HCT 43.3 04/22/2013 0545   PLT 197 04/22/2013 0545   MCV 85.4 04/22/2013 0545   MCH 29.4 04/22/2013 0545   MCHC 34.4 04/22/2013 0545   RDW 13.5 04/22/2013 0545   LYMPHSABS 1.9 04/22/2013 0545   MONOABS 0.7 04/22/2013 0545   EOSABS 0.2 04/22/2013 0545   BASOSABS 0.0 04/22/2013 0545   CMP    Component Value Date/Time   NA 141 04/22/2013 0545   K 3.4* 04/22/2013 0545   CL 101 04/22/2013 0545   CO2 32 04/22/2013 0545   GLUCOSE 192* 04/22/2013 0545   BUN 12 04/22/2013 0545   CREATININE 0.67 04/22/2013 0545   CALCIUM 8.8 04/22/2013 0545   PROT 6.7 04/22/2013 0545   ALBUMIN 3.7 04/22/2013 0545   AST 17 04/22/2013 0545   ALT 27 04/22/2013 0545   ALKPHOS 56 04/22/2013 0545   BILITOT 0.4 04/22/2013 0545   GFRNONAA >90 04/22/2013 0545   GFRAA >90 04/22/2013 0545   COAGS Lab Results  Component Value Date   INR 0.97 04/20/2013   INR 1.0 08/15/2008   Lipid Panel    Component Value Date/Time   CHOL 185 04/21/2013 0455   TRIG 364* 04/21/2013 0455   HDL 26* 04/21/2013 0455   CHOLHDL 7.1 04/21/2013 0455   VLDL 73* 04/21/2013 0455   LDLCALC 86 04/21/2013 0455   HgbA1C  Lab Results  Component Value Date   HGBA1C 9.7* 04/22/2013   Cardiac Panel (last 3 results) No results found for this basename: CKTOTAL, CKMB, TROPONINI, RELINDX,  in the last 72 hours Urinalysis    Component Value Date/Time   COLORURINE YELLOW 04/20/2013 [place] 04/20/2013 1630   LABSPEC 1.037* 04/20/2013 1630   PHURINE 6.0 04/20/2013 1630   GLUCOSEU >1000* 04/20/2013 1630   HGBUR NEGATIVE 04/20/2013 1630   BILIRUBINUR NEGATIVE 04/20/2013 1630   KETONESUR NEGATIVE 04/20/2013 1630   PROTEINUR NEGATIVE 04/20/2013 1630   UROBILINOGEN 0.2 04/20/2013 1630   NITRITE NEGATIVE 04/20/2013 1630   LEUKOCYTESUR NEGATIVE 04/20/2013 1630   Urine Drug Screen      Component Value Date/Time   LABOPIA NONE DETECTED 04/20/2013 [place] DETECTED 04/20/2013 1630  LABBENZ NONE DETECTED 04/20/2013 1630   AMPHETMU NONE DETECTED 04/20/2013 1630   THCU NONE DETECTED 04/20/2013 1630   LABBARB NONE DETECTED 04/20/2013 1630    Alcohol Level    Component Value Date/Time   ETH <11 04/20/2013 1614     SIGNIFICANT DIAGNOSTIC STUDIES CT of the brain 04/20/2013 No acute intracranial abnormality  MRI of the brain 04/21/2013 1. No acute intracranial abnormality. Stable and negative non contrast MRI appearance of the brain.  MRA of the brain 04/21/2013 Fetal type bilateral PCA origins with otherwise diminutive posterior circulation and evidence of distal vertebral artery atherosclerosis. Distal left vertebral artery stenosis (distal to the PICA origin) may be hemodynamically significant.  2D Echocardiogram EF 60-65% with no source of embolus.  Carotid Doppler Left: 40-59% internal carotid artery stenosis, probably due to mild vessel tortuosity. Vertebral artery flow is retrograde. Right: 1-39% ICA stenosis. Vertebral artery flow is antegrade.  CXR 04/20/2013 No evidence of acute cardiopulmonary disease.      History of Present Illness    Bryan Becker is a 52 y.o. male with a history of multiple medical problems including hypertension, diabetes, CHF, cirrhosis who [facility] with acute onset left-sided weakness. He was evaluated in the emergency department there and after consultation with Dr. Leonel Ramsay by phone, given the acute presentation of face arm and leg weakness felt a stroke was most likely diagnosis and he was administered TPA and transferred [facility] to the ICU.   Hospital Course   [name] is a 52 y.o. male presenting with left-sided numbness, slight weakness. The patient was given full dose IV TPA, with good improvement in clinical symptoms. The patient is very close to his  baseline. MRI negative for acute stroke. Dx: right brain stroke not seen on MRI vs non-organic cause of symptoms. The patient indicates that he was having issues with blood sugar control prior to admission. The patient was on aspirin prior to coming in the hospital. He is currently on plavix 75 mg daily.  Diabetes, HgbA1c 10.0  Obesity, Body mass index is 39.86 kg/(m^2).  Fibromyalgia  Hypertension  Nonalcoholic cirrhosis of the liver, liver biopsy done  Asthma, COPD  Dyslipidemia, LDL 86, no on statin prior to admission, goal < 70 in diabetic, patient unable to tolerate statin due to liver cirrhosis. Continue low carbohydrate diet.  Peptic ulcer disease  History of congestive heart failure  Renal calculi  Anxiety disorder  Headache, given oxycodone prn obstructive sleep apnea, on home cpap Hospital day # 4  TREATMENT/PLAN  Continue plavix 75 mg daily for secondary stroke prevention  Rehab when bed available-however, patient has good family support and wants to go home with home health therapy. Will order HHRN/PT/OT.  Ongoing risk factor management  Plan for patient follow up with Dr. Leonie Man in 2 months in stroke clinic.   Patient with vascular risk factors of:   Dyslipidemia  Diabetes mellitus  hypertension  Patient with continued stroke symptoms of left hemiparesis. Physical therapy, occupational therapy and speech therapy evaluated patient. They recommend outpatient therapy  Discharge Exam  Blood pressure 115/67, pulse 89, temperature 97.4 F (36.3 C), temperature source Oral, resp. rate 20, height 5' 9"  (1.753 m), weight 122.5 kg (270 lb 1 oz), SpO2 96.00%.   Physical Exam  General: The patient is alert and cooperative at the time of the examination. The patient is markedly obese.  Respiratory: Lungs fields are clear.  Cardiovascular: Regular rate and rhythm, no murmurs or rubs  are noted.  Abdominal: Abdomen is obese, nontender, positive bowel sounds.  Skin: No significant  peripheral edema is noted.  Neurologic Exam  Mental status: The patient is oriented x 3.  Cranial nerves: Facial symmetry is present. Speech is normal, no aphasia or dysarthria is noted. Extraocular movements are full. Visual fields are full.  Motor: The patient has good strength in all 4 extremities.diminished fine finger movements on left and poor effort on left grip strength testing. Mild LLE drift but doubt effort  Sensory examination:Soft touch sensation is symmetric on the face, arms, and legs.  Coordination: The patient has good finger-nose-finger and heel-to-shin bilaterally.  Gait and station: The gait was not tested. No pronator drift is seen.  Reflexes: Symmetric, but depressed    Discharge Diet   Carb Control thin liquids  Discharge Plan    Disposition:  home   clopidogrel 75 mg orally every day for secondary stroke prevention.  Ongoing risk factor control by Primary Care Physician. Risk factor recommendations:  Hypertension target range 130-140/70-80 Lipid range - LDL < 100 and checked every 6 months, fasting Diabetes - HgB A1C <7   Follow-up Harvie Junior, MD in 1 month.  Follow-up with Dr. Antony Contras, Stroke Clinic in 2 months.  40 minutes were spent preparing discharge.  Signed Regenia Skeeter. Owens Shark, [facility], [place], [facility] Pager: [phone] 04/24/2013 4:58 PM  I have personally examined this patient, reviewed pertinent data and developed the plan of care. I agree with above. Antony Contras, MD

## 2013-04-24 NOTE — Care Management Note (Unsigned)
    Page 1 of 2   04/24/2013     3:20:22 PM   CARE MANAGEMENT NOTE 04/24/2013  Patient:  Bryan Becker, Bryan Becker   Account Number:  [id number]  Date Initiated:  04/24/2013  Documentation initiated by:  Lorne Skeens  Subjective/Objective Assessment:   Patient admitted for L sided numbness, headache. Lives at home with wife.     Action/Plan:   Will follow for discharge needs   Anticipated DC Date:     Anticipated DC Plan:  [place]  CM consult      Choice offered to / List presented to:          [facility] arranged  HH-1 RN  [place].   Status of service:  Completed, signed off Medicare Important Message given?   (If response is "NO", the following Medicare IM given date fields will be blank) Date Medicare IM given:   Date Additional Medicare IM given:    Discharge Disposition:  [facility] [place]  Per UR Regulation:  Reviewed for med. necessity/level of care/duration of stay  If discussed at [place] of Stay Meetings, dates discussed:    Comments:  04/24/13 [place] RN, MSN, CM- Met with patient to discuss home health needs. Patient is agreeable to home health with [facility].  Marie with [facility] was notified and accepted the referral.  Patient is aware that Lelan Pons will be in to speak with him prior to discharge and have him fill out paperwork.  RN updated.   04/23/13 [place], MSN, CM- Recived word from financial counselor that patient had questions regarding whether he would be going to rehab.  CM met with patient to discuss the process and assured patient that CIR liason would be updating him on approval status pending medical readiness.    04/23/13 [place], MSN, CM- Spoke with patient's RN, who states that patient has questions regarding applying for Medicaid.  Financial Counselor was notified and will be up to see patient this afternoon. RN  updated.

## 2013-04-25 NOTE — Progress Notes (Signed)
Agree with SPTA.    [place], [place]

## 2013-08-21 ENCOUNTER — Emergency Department (HOSPITAL_BASED_OUTPATIENT_CLINIC_OR_DEPARTMENT_OTHER)
Admission: EM | Admit: 2013-08-21 | Discharge: 2013-08-21 | Disposition: A | Discharge status: Home / Self Care | Payer: Medicaid Other | Attending: Emergency Medicine | Admitting: Emergency Medicine

## 2013-08-21 ENCOUNTER — Emergency Department (HOSPITAL_BASED_OUTPATIENT_CLINIC_OR_DEPARTMENT_OTHER): Payer: Medicaid Other

## 2013-08-21 ENCOUNTER — Encounter (HOSPITAL_BASED_OUTPATIENT_CLINIC_OR_DEPARTMENT_OTHER): Payer: Self-pay | Admitting: Emergency Medicine

## 2013-08-21 DIAGNOSIS — Z8701 Personal history of pneumonia (recurrent): Secondary | ICD-10-CM | POA: Insufficient documentation

## 2013-08-21 DIAGNOSIS — I10 Essential (primary) hypertension: Secondary | ICD-10-CM | POA: Insufficient documentation

## 2013-08-21 DIAGNOSIS — Z7902 Long term (current) use of antithrombotics/antiplatelets: Secondary | ICD-10-CM | POA: Insufficient documentation

## 2013-08-21 DIAGNOSIS — Z8669 Personal history of other diseases of the nervous system and sense organs: Secondary | ICD-10-CM | POA: Insufficient documentation

## 2013-08-21 DIAGNOSIS — R109 Unspecified abdominal pain: Secondary | ICD-10-CM

## 2013-08-21 DIAGNOSIS — E119 Type 2 diabetes mellitus without complications: Secondary | ICD-10-CM | POA: Insufficient documentation

## 2013-08-21 DIAGNOSIS — J4489 Other specified chronic obstructive pulmonary disease: Secondary | ICD-10-CM | POA: Insufficient documentation

## 2013-08-21 DIAGNOSIS — IMO0001 Reserved for inherently not codable concepts without codable children: Secondary | ICD-10-CM | POA: Insufficient documentation

## 2013-08-21 DIAGNOSIS — W06XXXA Fall from bed, initial encounter: Secondary | ICD-10-CM | POA: Insufficient documentation

## 2013-08-21 DIAGNOSIS — Z87891 Personal history of nicotine dependence: Secondary | ICD-10-CM | POA: Insufficient documentation

## 2013-08-21 DIAGNOSIS — F411 Generalized anxiety disorder: Secondary | ICD-10-CM | POA: Insufficient documentation

## 2013-08-21 DIAGNOSIS — Y9389 Activity, other specified: Secondary | ICD-10-CM | POA: Insufficient documentation

## 2013-08-21 DIAGNOSIS — E785 Hyperlipidemia, unspecified: Secondary | ICD-10-CM | POA: Insufficient documentation

## 2013-08-21 DIAGNOSIS — I509 Heart failure, unspecified: Secondary | ICD-10-CM | POA: Insufficient documentation

## 2013-08-21 DIAGNOSIS — J449 Chronic obstructive pulmonary disease, unspecified: Secondary | ICD-10-CM | POA: Insufficient documentation

## 2013-08-21 DIAGNOSIS — Y929 Unspecified place or not applicable: Secondary | ICD-10-CM | POA: Insufficient documentation

## 2013-08-21 DIAGNOSIS — Z79899 Other long term (current) drug therapy: Secondary | ICD-10-CM | POA: Insufficient documentation

## 2013-08-21 DIAGNOSIS — Z8719 Personal history of other diseases of the digestive system: Secondary | ICD-10-CM | POA: Insufficient documentation

## 2013-08-21 DIAGNOSIS — Z87442 Personal history of urinary calculi: Secondary | ICD-10-CM | POA: Insufficient documentation

## 2013-08-21 DIAGNOSIS — F0390 Unspecified dementia without behavioral disturbance: Secondary | ICD-10-CM | POA: Insufficient documentation

## 2013-08-21 DIAGNOSIS — S3981XA Other specified injuries of abdomen, initial encounter: Secondary | ICD-10-CM | POA: Insufficient documentation

## 2013-08-21 DIAGNOSIS — IMO0002 Reserved for concepts with insufficient information to code with codable children: Secondary | ICD-10-CM | POA: Insufficient documentation

## 2013-08-21 LAB — COMPREHENSIVE METABOLIC PANEL
ALT: 31 U/L (ref 0–53)
AST: 25 U/L (ref 0–37)
Albumin: 4.2 g/dL (ref 3.5–5.2)
Alkaline Phosphatase: 42 U/L (ref 39–117)
BUN: 11 mg/dL (ref 6–23)
CALCIUM: 9.3 mg/dL (ref 8.4–10.5)
CO2: 26 meq/L (ref 19–32)
Chloride: 103 mEq/L (ref 96–112)
Creatinine, Ser: 0.7 mg/dL (ref 0.50–1.35)
GFR calc Af Amer: >90 mL/min (ref >90)
GFR calc non Af Amer: >90 mL/min (ref >90)
Glucose, Bld: 228 mg/dL — ABNORMAL HIGH (ref 70–99)
Potassium: 3.9 mEq/L (ref 3.7–5.3)
SODIUM: 142 meq/L (ref 137–147)
Total Bilirubin: 0.4 mg/dL (ref 0.3–1.2)
Total Protein: 7.2 g/dL (ref 6.0–8.3)

## 2013-08-21 LAB — CBC WITH DIFFERENTIAL/PLATELET
Basophils Absolute: 0 10*3/uL (ref 0.0–0.1)
Basophils Relative: 1 % (ref 0–1)
EOS ABS: 0.2 10*3/uL (ref 0.0–0.7)
Eosinophils Relative: 4 % (ref 0–5)
HCT: 40.9 % (ref 39.0–52.0)
Hemoglobin: 14 g/dL (ref 13.0–17.0)
LYMPHS PCT: 35 % (ref 12–46)
Lymphs Abs: 1.5 10*3/uL (ref 0.7–4.0)
MCH: 29.9 pg (ref 26.0–34.0)
MCHC: 34.2 g/dL (ref 30.0–36.0)
MCV: 87.4 fL (ref 78.0–100.0)
Monocytes Absolute: 0.3 10*3/uL (ref 0.1–1.0)
Monocytes Relative: 7 % (ref 3–12)
Neutro Abs: 2.3 10*3/uL (ref 1.7–7.7)
Neutrophils Relative %: 54 % (ref 43–77)
PLATELETS: 189 10*3/uL (ref 150–400)
RBC: 4.68 MIL/uL (ref 4.22–5.81)
RDW: 13.6 % (ref 11.5–15.5)
WBC: 4.3 10*3/uL (ref 4.0–10.5)

## 2013-08-21 LAB — URINALYSIS, ROUTINE W REFLEX MICROSCOPIC
Bilirubin Urine: NEGATIVE
Glucose, UA: NEGATIVE mg/dL
HGB URINE DIPSTICK: NEGATIVE
KETONES UR: NEGATIVE mg/dL
Leukocytes, UA: NEGATIVE
Nitrite: NEGATIVE
PROTEIN: NEGATIVE mg/dL
Specific Gravity, Urine: 1.007 (ref 1.005–1.030)
UROBILINOGEN UA: 0.2 mg/dL (ref 0.0–1.0)
pH: 6 (ref 5.0–8.0)

## 2013-08-21 LAB — PROTIME-INR
INR: 1.1 (ref 0.00–1.49)
PROTHROMBIN TIME: 14 s (ref 11.6–15.2)

## 2013-08-21 LAB — APTT: aPTT: 25 seconds (ref 24–37)

## 2013-08-21 LAB — AMMONIA: Ammonia: 42 umol/L (ref 11–60)

## 2013-08-21 LAB — LIPASE, BLOOD: Lipase: 35 U/L (ref 11–59)

## 2013-08-21 MED ORDER — ONDANSETRON HCL 4 MG/2ML IJ SOLN
4.0000 mg | Freq: Once | INTRAMUSCULAR | Status: AC
  Administered 2013-08-21: 4 mg via INTRAVENOUS
  Filled 2013-08-21: qty 2

## 2013-08-21 MED ORDER — SODIUM CHLORIDE 0.9 % IV BOLUS (SEPSIS)
1000.0000 mL | Freq: Once | INTRAVENOUS | Status: AC
  Administered 2013-08-21: 1000 mL via INTRAVENOUS

## 2013-08-21 MED ORDER — FENTANYL CITRATE 0.05 MG/ML IJ SOLN
50.0000 ug | Freq: Once | INTRAMUSCULAR | Status: AC
  Administered 2013-08-21: 50 ug via INTRAVENOUS
  Filled 2013-08-21: qty 2

## 2013-08-21 NOTE — ED Notes (Signed)
Patient transported to CT 

## 2013-08-21 NOTE — Discharge Instructions (Signed)
Abdominal Pain, Adult °Many things can cause abdominal pain. Usually, abdominal pain is not caused by a disease and will improve without treatment. It can often be observed and treated at home. Your health care provider will do a physical exam and possibly order blood tests and X-rays to help determine the seriousness of your pain. However, in many cases, more time must pass before a clear cause of the pain can be found. Before that point, your health care provider may not know if you need more testing or further treatment. °HOME CARE INSTRUCTIONS  °Monitor your abdominal pain for any changes. The following actions may help to alleviate any discomfort you are experiencing: °· Only take over-the-counter or prescription medicines as directed by your health care provider. °· Do not take laxatives unless directed to do so by your health care provider. °· Try a clear liquid diet (broth, tea, or water) as directed by your health care provider. Slowly move to a bland diet as tolerated. °SEEK MEDICAL CARE IF: °· You have unexplained abdominal pain. °· You have abdominal pain associated with nausea or diarrhea. °· You have pain when you urinate or have a bowel movement. °· You experience abdominal pain that wakes you in the night. °· You have abdominal pain that is worsened or improved by eating food. °· You have abdominal pain that is worsened with eating fatty foods. °SEEK IMMEDIATE MEDICAL CARE IF:  °· Your pain does not go away within 2 hours. °· You have a fever. °· You keep throwing up (vomiting). °· Your pain is felt only in portions of the abdomen, such as the right side or the left lower portion of the abdomen. °· You pass bloody or black tarry stools. °MAKE SURE YOU: °· Understand these instructions.   °· Will watch your condition.   °· Will get help right away if you are not doing well or get worse.   °Document Released: 02/16/2005 Document Revised: 02/27/2013 Document Reviewed: 01/16/2013 °[employer]® Patient  Information ©2014 [employer]. ° °

## 2013-08-21 NOTE — ED Provider Notes (Signed)
CSN: [id number]     Arrival date & time 08/21/13  1229 History   First MD Initiated Contact with Patient 08/21/13 1303     Chief Complaint  Patient presents with  . Abdominal Pain     (Consider location/radiation/quality/duration/timing/severity/associated sxs/prior Treatment) Patient is a 53 y.o. male presenting with abdominal pain.  Abdominal Pain  Pt with histo of multiple medical problems including dementia and NASH reports he got out of bed twice on Saturday (4 nights ago) to go to the bathroom and fell both times. States he felt confused afterward and has been generally weak ever since. He began having moderate aching R sided abdominal pain 2 days ago, not associated with vomiting or diarrhea. No fever, no chest pain, SOB.  Past Medical History  Diagnosis Date  . Diabetes mellitus   . Hypertension   . Pancreatitis   . Kidney stone   . CHF (congestive heart failure)   . Pneumonia   . 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 Neck pain   . Seasonal allergies   . Asthma   . Anxiety   . COPD (chronic obstructive pulmonary disease)   . Hyperlipidemia   . Gastric ulcer   . Fibromyalgia   . OSA (obstructive sleep apnea) 04/24/2013   Past Surgical History  Procedure Laterality Date  . Cholecystectomy    . Multiple cysts removal-hip,wrist    . Upper gastrointestinal endoscopy    . Colonoscopy     Family History  Problem Relation Age of Onset  . Colon cancer Neg Hx   . Esophageal cancer Neg Hx   . Rectal cancer Neg Hx   . Skin cancer Father   . Lung cancer Paternal Grandfather   . Stomach cancer Maternal Grandfather   . Bone cancer Paternal Grandmother   . Diabetes Mother   . Diabetes Maternal Grandmother   . Heart disease Maternal Grandmother   . Heart disease Father   . Heart disease Paternal Grandfather   . Heart attack Paternal Grandmother    History  Substance Use Topics  . Smoking status: Former Smoker    Types: Cigarettes    Quit date: 05/23/2002   . Smokeless tobacco: Never Used  . Alcohol Use: No    Review of Systems  Gastrointestinal: Positive for abdominal pain.   All other systems reviewed and are negative except as noted in HPI.     Allergies  Codeine  Home Medications   Current Outpatient Rx  Name  Route  Sig  Dispense  Refill  . albuterol (PROVENTIL HFA;VENTOLIN HFA) 108 (90 BASE) MCG/ACT inhaler   Inhalation   Inhale 2 puffs into the lungs every 6 (six) hours as needed. For shortness of breath          . citalopram (CELEXA) 40 MG tablet   Oral   Take 40 mg by mouth daily.           . clopidogrel (PLAVIX) 75 MG tablet   Oral   Take 1 tablet (75 mg total) by mouth daily with breakfast.   30 tablet   2   . cyanocobalamin (,VITAMIN B-12,) 1000 MCG/ML injection   Intramuscular   Inject 1,000 mcg into the muscle once a week.          . donepezil (ARICEPT) 10 MG tablet   Oral   Take 10 mg by mouth at bedtime.         . fluticasone (FLONASE) 50 MCG/ACT nasal spray  Nasal   Place 2 sprays into the nose daily.   16 g   0   . furosemide (LASIX) 40 MG tablet   Oral   Take 40 mg by mouth 2 (two) times daily.           Marland Kitchen gabapentin (NEURONTIN) 400 MG capsule   Oral   Take 600 mg by mouth 3 (three) times daily.          Marland Kitchen glimepiride (AMARYL) 4 MG tablet   Oral   Take 2 mg by mouth daily with breakfast.          . Liraglutide (VICTOZA) 18 MG/3ML SOLN   Subcutaneous   Inject 1.8 mg into the skin daily.          Marland Kitchen lisinopril (PRINIVIL,ZESTRIL) 40 MG tablet   Oral   Take 40 mg by mouth 2 (two) times daily.          Marland Kitchen LORazepam (ATIVAN) 1 MG tablet   Oral   Take 1 mg by mouth 2 (two) times daily.           Marland Kitchen omeprazole (PRILOSEC) 20 MG capsule   Oral   Take 20 mg by mouth daily.         . ondansetron (ZOFRAN ODT) 4 MG disintegrating tablet   Oral   Take 1 tablet (4 mg total) by mouth every 8 (eight) hours as needed for nausea.   30 tablet   0   . Potassium Gluconate  595 MG CAPS   Oral   Take 595 mg by mouth daily.          Marland Kitchen tiotropium (SPIRIVA) 18 MCG inhalation capsule   Inhalation   Place 18 mcg into inhaler and inhale 2 (two) times daily as needed (wheezing).          BP 135/70  Pulse 80  Temp(Src) 98.2 F (36.8 C) (Oral)  Resp 16  Ht 5' 9"  (1.753 m)  Wt 280 lb (127.007 kg)  BMI 41.33 kg/m2  SpO2 98% Physical Exam  Nursing note and vitals reviewed. Constitutional: He is oriented to person, place, and time. He appears well-developed and well-nourished.  HENT:  Head: Normocephalic and atraumatic.  Eyes: EOM are normal. Pupils are equal, round, and reactive to light.  Neck: Normal range of motion. Neck supple.  Cardiovascular: Normal rate, normal heart sounds and intact distal pulses.   Pulmonary/Chest: Effort normal and breath sounds normal.  Abdominal: Soft. Bowel sounds are normal. He exhibits no distension. There is tenderness (RUQ). There is no rebound and no guarding.  Musculoskeletal: Normal range of motion. He exhibits no edema and no tenderness.  Neurological: He is alert and oriented to person, place, and time. He has normal strength. No cranial nerve deficit or sensory deficit.  Skin: Skin is warm and dry. No rash noted.  Psychiatric: He has a normal mood and affect.    ED Course  Procedures (including critical care time) Labs Review Labs Reviewed  COMPREHENSIVE METABOLIC PANEL - Abnormal; Notable for the following:    Glucose, Bld 228 (*)    All other components within normal limits  URINALYSIS, ROUTINE W REFLEX MICROSCOPIC  CBC WITH DIFFERENTIAL  LIPASE, BLOOD  AMMONIA  PROTIME-INR  APTT   Imaging Review Ct Head Wo Contrast  08/21/2013   CLINICAL DATA:  Headache with dizziness and confusion. Patient is anticoagulated.  EXAM: CT HEAD WITHOUT CONTRAST  TECHNIQUE: Contiguous axial images were obtained from the base of the skull  through the vertex without contrast.  COMPARISON:  MR HEAD W/O CM dated 04/21/2013; MR  MRA HEAD W/O CM dated 04/21/2013; CT HEAD W/O CM dated 04/20/2013  FINDINGS: Normal appearance of the intracranial structures. No evidence for acute hemorrhage, mass lesion, midline shift, hydrocephalus or large infarct. No acute bony abnormality. The visualized sinuses are clear. Mild vascular calcification carotid siphon regions.  IMPRESSION: No acute intracranial abnormality.   Electronically Signed   By: Rolla Flatten M.D.   On: 08/21/2013 14:05   US Abdomen Complete  08/21/2013   CLINICAL DATA:  Nausea  EXAM: ULTRASOUND ABDOMEN COMPLETE  COMPARISON:  CT, 11/19/2012  FINDINGS: Gallbladder:  Surgically absent  Common bile duct:  Diameter: 5.3 mm  Liver:  Coarsened echotexture with overall increased echogenicity and poor through transmission of the sound beam. No liver mass or focal lesion. Hepatopetal flow documented in the portal vein.  IVC:  No abnormality visualized.  Pancreas:  Visualized portion unremarkable.  Spleen:  Size and appearance within normal limits.  Right Kidney:  Length: 14.3. Echogenicity within normal limits. No mass or hydronephrosis visualized.  Left Kidney:  Length: 13 point none. Echogenicity within normal limits. No mass or hydronephrosis visualized.  Abdominal aorta:  Incompletely imaged.  No aneurysm seen.  Other findings:  None.  IMPRESSION: 1. No acute finding 2. Hepatic steatosis 3. Status post cholecystectomy.   Electronically Signed   By: Lajean Manes M.D.   On: 08/21/2013 14:48     EKG Interpretation None      MDM   Final diagnoses:  Abdominal pain   Labs and imaging reviewed and unremarkable. Abdomen benign, no concern for surgical or infectious process. Pt has PCP appointment for tomorrow. No clear etiology of symptoms, but does not appear to be hepatic encephalopathy or acute liver failure.     Charles B. Karle Starch, MD 08/21/13 1558

## 2013-08-21 NOTE — ED Notes (Signed)
MD at bedside. 

## 2013-08-21 NOTE — ED Notes (Signed)
Reports RUQ pain radiating to right flank, distention, nausea and fatigue since Saturday.  He reports increased confusion during the night on Saturday and fell out of bed x 2.  He is concerned with a rash under left breast that is described as burning sensation.

## 2013-10-24 ENCOUNTER — Emergency Department (HOSPITAL_BASED_OUTPATIENT_CLINIC_OR_DEPARTMENT_OTHER): Payer: Medicaid Other

## 2013-10-24 ENCOUNTER — Encounter (HOSPITAL_BASED_OUTPATIENT_CLINIC_OR_DEPARTMENT_OTHER): Payer: Self-pay | Admitting: Emergency Medicine

## 2013-10-24 ENCOUNTER — Emergency Department (HOSPITAL_BASED_OUTPATIENT_CLINIC_OR_DEPARTMENT_OTHER)
Admission: EM | Admit: 2013-10-24 | Discharge: 2013-10-24 | Disposition: A | Discharge status: Home / Self Care | Payer: Medicaid Other | Attending: Emergency Medicine | Admitting: Emergency Medicine

## 2013-10-24 DIAGNOSIS — Z87442 Personal history of urinary calculi: Secondary | ICD-10-CM | POA: Insufficient documentation

## 2013-10-24 DIAGNOSIS — J4489 Other specified chronic obstructive pulmonary disease: Secondary | ICD-10-CM | POA: Insufficient documentation

## 2013-10-24 DIAGNOSIS — Z79899 Other long term (current) drug therapy: Secondary | ICD-10-CM | POA: Insufficient documentation

## 2013-10-24 DIAGNOSIS — Z87891 Personal history of nicotine dependence: Secondary | ICD-10-CM | POA: Insufficient documentation

## 2013-10-24 DIAGNOSIS — I509 Heart failure, unspecified: Secondary | ICD-10-CM | POA: Insufficient documentation

## 2013-10-24 DIAGNOSIS — J449 Chronic obstructive pulmonary disease, unspecified: Secondary | ICD-10-CM | POA: Insufficient documentation

## 2013-10-24 DIAGNOSIS — Z8701 Personal history of pneumonia (recurrent): Secondary | ICD-10-CM | POA: Insufficient documentation

## 2013-10-24 DIAGNOSIS — Z8719 Personal history of other diseases of the digestive system: Secondary | ICD-10-CM | POA: Insufficient documentation

## 2013-10-24 DIAGNOSIS — R1011 Right upper quadrant pain: Secondary | ICD-10-CM | POA: Insufficient documentation

## 2013-10-24 DIAGNOSIS — E785 Hyperlipidemia, unspecified: Secondary | ICD-10-CM | POA: Insufficient documentation

## 2013-10-24 DIAGNOSIS — Z8739 Personal history of other diseases of the musculoskeletal system and connective tissue: Secondary | ICD-10-CM | POA: Insufficient documentation

## 2013-10-24 DIAGNOSIS — Z7902 Long term (current) use of antithrombotics/antiplatelets: Secondary | ICD-10-CM | POA: Insufficient documentation

## 2013-10-24 DIAGNOSIS — Z8669 Personal history of other diseases of the nervous system and sense organs: Secondary | ICD-10-CM | POA: Insufficient documentation

## 2013-10-24 DIAGNOSIS — I10 Essential (primary) hypertension: Secondary | ICD-10-CM | POA: Insufficient documentation

## 2013-10-24 DIAGNOSIS — E119 Type 2 diabetes mellitus without complications: Secondary | ICD-10-CM | POA: Insufficient documentation

## 2013-10-24 DIAGNOSIS — F411 Generalized anxiety disorder: Secondary | ICD-10-CM | POA: Insufficient documentation

## 2013-10-24 DIAGNOSIS — IMO0002 Reserved for concepts with insufficient information to code with codable children: Secondary | ICD-10-CM | POA: Insufficient documentation

## 2013-10-24 DIAGNOSIS — R109 Unspecified abdominal pain: Secondary | ICD-10-CM

## 2013-10-24 LAB — CBC WITH DIFFERENTIAL/PLATELET
BASOS ABS: 0 10*3/uL (ref 0.0–0.1)
Basophils Relative: 1 % (ref 0–1)
Eosinophils Absolute: 0.1 10*3/uL (ref 0.0–0.7)
Eosinophils Relative: 3 % (ref 0–5)
HEMATOCRIT: 39.4 % (ref 39.0–52.0)
HEMOGLOBIN: 14.1 g/dL (ref 13.0–17.0)
LYMPHS PCT: 42 % (ref 12–46)
Lymphs Abs: 1.8 10*3/uL (ref 0.7–4.0)
MCH: 31.1 pg (ref 26.0–34.0)
MCHC: 35.8 g/dL (ref 30.0–36.0)
MCV: 87 fL (ref 78.0–100.0)
MONOS PCT: 9 % (ref 3–12)
Monocytes Absolute: 0.4 10*3/uL (ref 0.1–1.0)
Neutro Abs: 1.9 10*3/uL (ref 1.7–7.7)
Neutrophils Relative %: 45 % (ref 43–77)
Platelets: 184 10*3/uL (ref 150–400)
RBC: 4.53 MIL/uL (ref 4.22–5.81)
RDW: 13.2 % (ref 11.5–15.5)
WBC: 4.2 10*3/uL (ref 4.0–10.5)

## 2013-10-24 LAB — COMPREHENSIVE METABOLIC PANEL
ALK PHOS: 48 U/L (ref 39–117)
ALT: 26 U/L (ref 0–53)
AST: 22 U/L (ref 0–37)
Albumin: 4.2 g/dL (ref 3.5–5.2)
BILIRUBIN TOTAL: 0.2 mg/dL — AB (ref 0.3–1.2)
BUN: 9 mg/dL (ref 6–23)
CHLORIDE: 102 meq/L (ref 96–112)
CO2: 24 meq/L (ref 19–32)
CREATININE: 0.7 mg/dL (ref 0.50–1.35)
Calcium: 8.6 mg/dL (ref 8.4–10.5)
GFR calc Af Amer: >90 mL/min (ref >90)
Glucose, Bld: 226 mg/dL — ABNORMAL HIGH (ref 70–99)
Potassium: 3.5 mEq/L — ABNORMAL LOW (ref 3.7–5.3)
Sodium: 141 mEq/L (ref 137–147)
Total Protein: 6.8 g/dL (ref 6.0–8.3)

## 2013-10-24 LAB — URINALYSIS, ROUTINE W REFLEX MICROSCOPIC
Bilirubin Urine: NEGATIVE
Glucose, UA: 100 mg/dL — AB
Hgb urine dipstick: NEGATIVE
KETONES UR: NEGATIVE mg/dL
Leukocytes, UA: NEGATIVE
Nitrite: NEGATIVE
PROTEIN: NEGATIVE mg/dL
Specific Gravity, Urine: 1.022 (ref 1.005–1.030)
UROBILINOGEN UA: 1 mg/dL (ref 0.0–1.0)
pH: 5.5 (ref 5.0–8.0)

## 2013-10-24 LAB — LIPASE, BLOOD: Lipase: 30 U/L (ref 11–59)

## 2013-10-24 LAB — I-STAT CG4 LACTIC ACID, ED: Lactic Acid, Venous: 1.74 mmol/L (ref 0.5–2.2)

## 2013-10-24 LAB — TROPONIN I: Troponin I: <0.3 ng/mL (ref <0.30)

## 2013-10-24 MED ORDER — HYDROMORPHONE HCL PF 1 MG/ML IJ SOLN
1.0000 mg | Freq: Once | INTRAMUSCULAR | Status: AC
  Administered 2013-10-24: 1 mg via INTRAVENOUS

## 2013-10-24 MED ORDER — SODIUM CHLORIDE 0.9 % IV BOLUS (SEPSIS)
1000.0000 mL | Freq: Once | INTRAVENOUS | Status: AC
  Administered 2013-10-24: 1000 mL via INTRAVENOUS

## 2013-10-24 MED ORDER — HYDROMORPHONE HCL PF 1 MG/ML IJ SOLN
1.0000 mg | Freq: Once | INTRAMUSCULAR | Status: AC
  Administered 2013-10-24: 1 mg via INTRAVENOUS
  Filled 2013-10-24: qty 1

## 2013-10-24 MED ORDER — ONDANSETRON HCL 4 MG/2ML IJ SOLN
INTRAMUSCULAR | Status: AC
  Filled 2013-10-24: qty 2

## 2013-10-24 MED ORDER — ONDANSETRON HCL 4 MG/2ML IJ SOLN
4.0000 mg | Freq: Once | INTRAMUSCULAR | Status: AC
  Administered 2013-10-24: 4 mg via INTRAVENOUS

## 2013-10-24 MED ORDER — ONDANSETRON HCL 4 MG/2ML IJ SOLN
4.0000 mg | Freq: Once | INTRAMUSCULAR | Status: AC
  Administered 2013-10-24: 4 mg via INTRAVENOUS
  Filled 2013-10-24: qty 2

## 2013-10-24 MED ORDER — IOHEXOL 300 MG/ML  SOLN
50.0000 mL | Freq: Once | INTRAMUSCULAR | Status: AC | PRN
  Administered 2013-10-24: 50 mL via ORAL

## 2013-10-24 MED ORDER — HYDROMORPHONE HCL PF 1 MG/ML IJ SOLN
INTRAMUSCULAR | Status: AC
  Filled 2013-10-24: qty 1

## 2013-10-24 MED ORDER — SODIUM CHLORIDE 0.9 % IV SOLN: Freq: Once | INTRAVENOUS | Status: DC

## 2013-10-24 MED ORDER — IOHEXOL 300 MG/ML  SOLN
100.0000 mL | Freq: Once | INTRAMUSCULAR | Status: AC | PRN
  Administered 2013-10-24: 100 mL via INTRAVENOUS

## 2013-10-24 NOTE — ED Notes (Signed)
Pt nauseated

## 2013-10-24 NOTE — ED Provider Notes (Signed)
CSN: [id number]     Arrival date & time 10/24/13  1347 History   First MD Initiated Contact with Patient 10/24/13 1430     Chief Complaint  Patient presents with  . Abdominal Pain     (Consider location/radiation/quality/duration/timing/severity/associated sxs/prior Treatment) HPI Bryan Becker is a(n) 53 y.o. male who presents fro RUQ abdominal pain. He has multiple comorbidities including DM, morbid obestiy. He reports a history of cirrhosis, pancreatitis, kidney problems, kidney stones, copd, fibromyalgia and chronic pain. The patient complains of 5 days of worsening right upper quadrant pain which radiates to his upper back. It is constant, nothing seems to make it worse or better. It is not associated with eating. He has associated nausea and decreased appetite. He was seen 2 days ago by his primary care doctor for the same complaint. He states it has acutely worsened in the past 2 days and he has had a 6 pound weight gain as well as abdominal swelling. The patient complains of severe pain with associated nausea and 2 episodes of vomiting in the past 24 hours. Patient states he has had normal bowel movements without diarrhea or constipation, he denies any melena or hematochezia either. The patient states that he went back to his doctor's office today and his physician sent him over here for further evaluation. He was complaining of some chest pain and shortness of breath which lasted about 10 minutes and resolved. Nonexertional, no radiation to the left jaw or left shoulder. Patient does have some risk factors for a CSF which include diabetes, hypertension, hyperlipidemia, morbid obesity. Patient states that his chest pain has resolved. She denies any urinary symptoms. Patient states he thinks this is his "cirrhosis." He has had multiple episodes like this before and been seen at the emergency department for them. He denies any fevers, chills, myalgias, arthralgias. Past Medical History  Diagnosis  Date  . Diabetes mellitus   . Hypertension   . Pancreatitis   . Kidney stone   . CHF (congestive heart failure)   . Pneumonia   . 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 Neck pain   . Seasonal allergies   . Asthma   . Anxiety   . COPD (chronic obstructive pulmonary disease)   . Hyperlipidemia   . Gastric ulcer   . Fibromyalgia   . OSA (obstructive sleep apnea) 04/24/2013   Past Surgical History  Procedure Laterality Date  . Cholecystectomy    . Multiple cysts removal-hip,wrist    . Upper gastrointestinal endoscopy    . Colonoscopy     Family History  Problem Relation Age of Onset  . Colon cancer Neg Hx   . Esophageal cancer Neg Hx   . Rectal cancer Neg Hx   . Skin cancer Father   . Lung cancer Paternal Grandfather   . Stomach cancer Maternal Grandfather   . Bone cancer Paternal Grandmother   . Diabetes Mother   . Diabetes Maternal Grandmother   . Heart disease Maternal Grandmother   . Heart disease Father   . Heart disease Paternal Grandfather   . Heart attack Paternal Grandmother    History  Substance Use Topics  . Smoking status: Former Smoker    Types: Cigarettes    Quit date: 05/23/2002  . Smokeless tobacco: Never Used  . Alcohol Use: No    Review of Systems  Ten systems reviewed and are negative for acute change, except as noted in the HPI.     Allergies  Codeine  Home Medications   Prior to Admission medications   Medication Sig Start Date End Date Taking? Authorizing Provider  albuterol (PROVENTIL HFA;VENTOLIN HFA) 108 (90 BASE) MCG/ACT inhaler Inhale 2 puffs into the lungs every 6 (six) hours as needed. For shortness of breath     Historical Provider, MD  citalopram (CELEXA) 40 MG tablet Take 40 mg by mouth daily.      Historical Provider, MD  clopidogrel (PLAVIX) 75 MG tablet Take 1 tablet (75 mg total) by mouth daily with breakfast. 04/24/13   Abelina Bachelor, PA-C  cyanocobalamin (,VITAMIN B-12,) 1000 MCG/ML injection Inject 1,000  mcg into the muscle once a week.     Historical Provider, MD  donepezil (ARICEPT) 10 MG tablet Take 10 mg by mouth at bedtime.    Historical Provider, MD  fluticasone (FLONASE) 50 MCG/ACT nasal spray Place 2 sprays into the nose daily. 06/17/12   April K Palumbo-Rasch, MD  furosemide (LASIX) 40 MG tablet Take 40 mg by mouth 2 (two) times daily.      Historical Provider, MD  gabapentin (NEURONTIN) 400 MG capsule Take 600 mg by mouth 3 (three) times daily.     Historical Provider, MD  glimepiride (AMARYL) 4 MG tablet Take 2 mg by mouth daily with breakfast.     Historical Provider, MD  Liraglutide (VICTOZA) 18 MG/3ML SOLN Inject 1.8 mg into the skin daily.     Historical Provider, MD  lisinopril (PRINIVIL,ZESTRIL) 40 MG tablet Take 40 mg by mouth 2 (two) times daily.     Historical Provider, MD  LORazepam (ATIVAN) 1 MG tablet Take 1 mg by mouth 2 (two) times daily.      Historical Provider, MD  omeprazole (PRILOSEC) 20 MG capsule Take 20 mg by mouth daily.    Historical Provider, MD  ondansetron (ZOFRAN ODT) 4 MG disintegrating tablet Take 1 tablet (4 mg total) by mouth every 8 (eight) hours as needed for nausea. 11/21/12   Allie Bossier, MD  Potassium Gluconate 595 MG CAPS Take 595 mg by mouth daily.     Historical Provider, MD  tiotropium (SPIRIVA) 18 MCG inhalation capsule Place 18 mcg into inhaler and inhale 2 (two) times daily as needed (wheezing).    Historical Provider, MD   BP 128/70  Pulse 86  Temp(Src) 98.8 F (37.1 C) (Oral)  Resp 18  Ht 5' 9"  (1.753 m)  Wt 293 lb (132.904 kg)  BMI 43.25 kg/m2  SpO2 99% Physical Exam  Nursing note and vitals reviewed. Constitutional: No distress.  Morbidly obese male appearing greater than stated age. He appears to be very uncomfortable.  HENT:  Head: Normocephalic and atraumatic.  Eyes: Conjunctivae are normal.  Neck: Normal range of motion. Neck supple.  Cardiovascular: Normal rate, regular rhythm and normal heart sounds.   Pulmonary/Chest:  Effort normal and breath sounds normal. No respiratory distress.  Abdominal: Soft. Bowel sounds are normal. He exhibits distension. He exhibits no mass. There is tenderness. There is no rebound and no guarding.  Massive, obese distended abdomen. Presently tender to palpation especially in the right upper and lower quadrants. And epigastrium.  No rebound, no CVA tenderness.  Skin: He is not diaphoretic.    ED Course  Procedures (including critical care time) Labs Review Labs Reviewed  CBC WITH DIFFERENTIAL  COMPREHENSIVE METABOLIC PANEL  URINALYSIS, ROUTINE W REFLEX MICROSCOPIC  LIPASE, BLOOD  CBG MONITORING, ED    Imaging Review Dg Chest 2 View  10/24/2013   CLINICAL DATA:  Shortness of breath.  COPD  EXAM: CHEST  2 VIEW  COMPARISON:  04/20/2013  FINDINGS: The heart size and mediastinal contours are within normal limits. Both lungs are clear. Spondylosis noted within the thoracic spine.  IMPRESSION: No active cardiopulmonary disease.   Electronically Signed   By: Kerby Moors M.D.   On: 10/24/2013 16:06     EKG Interpretation   Date/Time:  Thursday October 24 2013 16:34:43 EDT Ventricular Rate:  86 PR Interval:  130 QRS Duration: 88 QT Interval:  402 QTC Calculation: 481 R Axis:   54 Text Interpretation:  Normal sinus rhythm Prolonged QT Abnormal ECG When  compared with ECG of 04/20/2013, QT has lengthened Confirmed by Roxanne Mins  MD,  DAVID ([id number]) on 10/24/2013 4:49:51 PM      MDM   Final diagnoses:  None   Patient with acute abdominal pain. Patient seen at your visit with Dr. Roxanne Mins.  Differential includes pancreatitis, spontaneous bacterial peritonitis, kidney stone, cirrhosis. Patient with one episode of cp as well. EKG not concerning for ischemic changes at this time.  CXR shows no acute abnormality.   4:53 PM Filed Vitals:   10/24/13 1456 10/24/13 1627 10/24/13 1630  BP: 128/70 154/70 149/71  Pulse: 86 94 85  Temp: 98.8 F (37.1 C)    TempSrc: Oral    Resp: 18     Height: 5' 9"  (1.753 m)    Weight: 293 lb (132.904 kg)    SpO2: 99% 98% 96%   Patient with mild htn. Afebrile. Labs show mild hypokalemia. No active vomiting in the ed.  Labs otherwise unremarkable. Awaiting ct scan    5:37 PM CT scan negative for acute change. Pain and nausea still uncontrolled. Receiving fluids and pain meds. Will reevaluate.   6:51 PM Patient pain improved. He is tolerating PO fluids. do not feel that he needs a second troponin, no repeat chest pain. No vomiting here in the emergency department. Any followup tomorrow with his primary care physician regarding his acute on chronic abdominal pain.  I personally reviewed the imaging tests through PACS system. I have reviewed and interpreted Lab values. I reviewed available ER/hospitalization records through the [place], [place] 10/24/13 1853

## 2013-10-24 NOTE — ED Provider Notes (Signed)
53 year old male with history of cirrhosis and no kidney stones as well as heart failure and stroke comes in with 3 days of abdominal pain, nausea, weakness. On exam, he has diffuse abdominal tenderness which is clearly worse in the right upper quadrant with rebound tenderness present. Bowel sounds are decreased. He does have 1+ pitting edema. Etiology is not clear at this point. CT scan is pending.  Workup including CT scan is unremarkable. He does have a history of chronic pain. With completely normal laboratory evaluation, it is felt appropriate to discharge him with close followup by his PCP.  Medical screening examination/treatment/procedure(s) were conducted as a shared visit with non-physician practitioner(s) and myself.  I personally evaluated the patient during the encounter.   EKG Interpretation   Date/Time:  Thursday October 24 2013 16:34:43 EDT Ventricular Rate:  86 PR Interval:  130 QRS Duration: 88 QT Interval:  402 QTC Calculation: 481 R Axis:   54 Text Interpretation:  Normal sinus rhythm Prolonged QT Abnormal ECG When  compared with ECG of 04/20/2013, QT has lengthened Confirmed by Roxanne Mins  MD,  Kaley Jutras ([id number]) on 10/24/2013 4:49:51 PM        Delora Fuel, MD 33/61/22 [id number]

## 2013-10-24 NOTE — Discharge Instructions (Signed)
Abdominal (belly) pain can be caused by many things. Your caregiver performed an examination and possibly ordered blood/urine tests and imaging (CT scan, x-rays, ultrasound). Many cases can be observed and treated at home after initial evaluation in the emergency department. Even though you are being discharged home, abdominal pain can be unpredictable. Therefore, you need a repeated exam if your pain does not resolve, returns, or worsens. Most patients with abdominal pain don't have to be admitted to the hospital or have surgery, but serious problems like appendicitis and gallbladder attacks can start out as nonspecific pain. Many abdominal conditions cannot be diagnosed in one visit, so follow-up evaluations are very important. SEEK IMMEDIATE MEDICAL ATTENTION IF: The pain does not go away or becomes severe.  A temperature above 101 develops.  Repeated vomiting occurs (multiple episodes).  The pain becomes localized to portions of the abdomen. The right side could possibly be appendicitis. In an adult, the left lower portion of the abdomen could be colitis or diverticulitis.  Blood is being passed in stools or vomit (bright red or black tarry stools).  Return also if you develop chest pain, difficulty breathing, dizziness or fainting, or become confused, poorly responsive, or inconsolable (young children).   Abdominal Pain, Adult Many things can cause abdominal pain. Usually, abdominal pain is not caused by a disease and will improve without treatment. It can often be observed and treated at home. Your health care provider will do a physical exam and possibly order blood tests and X-rays to help determine the seriousness of your pain. However, in many cases, more time must pass before a clear cause of the pain can be found. Before that point, your health care provider may not know if you need more testing or further treatment. HOME CARE INSTRUCTIONS  Monitor your abdominal pain for any changes. The  following actions may help to alleviate any discomfort you are experiencing:  Only take over-the-counter or prescription medicines as directed by your health care provider.  Do not take laxatives unless directed to do so by your health care provider.  Try a clear liquid diet (broth, tea, or water) as directed by your health care provider. Slowly move to a bland diet as tolerated. SEEK MEDICAL CARE IF:  You have unexplained abdominal pain.  You have abdominal pain associated with nausea or diarrhea.  You have pain when you urinate or have a bowel movement.  You experience abdominal pain that wakes you in the night.  You have abdominal pain that is worsened or improved by eating food.  You have abdominal pain that is worsened with eating fatty foods. SEEK IMMEDIATE MEDICAL CARE IF:   Your pain does not go away within 2 hours.  You have a fever.  You keep throwing up (vomiting).  Your pain is felt only in portions of the abdomen, such as the right side or the left lower portion of the abdomen.  You pass bloody or black tarry stools. MAKE SURE YOU:  Understand these instructions.   Will watch your condition.   Will get help right away if you are not doing well or get worse.  Document Released: 02/16/2005 Document Revised: 02/27/2013 Document Reviewed: 01/16/2013 [facility] Patient Information 2014 [place].

## 2013-10-24 NOTE — ED Notes (Signed)
Right upper quad pain. Nausea x 3 days. Weakness. Chronic pain. Was seen by his MD and told to come here for possible IV fluids.

## 2013-11-19 ENCOUNTER — Emergency Department (HOSPITAL_BASED_OUTPATIENT_CLINIC_OR_DEPARTMENT_OTHER)
Admission: EM | Admit: 2013-11-19 | Discharge: 2013-11-19 | Disposition: A | Discharge status: Home / Self Care | Payer: Medicaid Other | Attending: Emergency Medicine | Admitting: Emergency Medicine

## 2013-11-19 ENCOUNTER — Emergency Department (HOSPITAL_BASED_OUTPATIENT_CLINIC_OR_DEPARTMENT_OTHER): Payer: Medicaid Other

## 2013-11-19 ENCOUNTER — Encounter (HOSPITAL_BASED_OUTPATIENT_CLINIC_OR_DEPARTMENT_OTHER): Payer: Self-pay | Admitting: Emergency Medicine

## 2013-11-19 DIAGNOSIS — Z7902 Long term (current) use of antithrombotics/antiplatelets: Secondary | ICD-10-CM | POA: Insufficient documentation

## 2013-11-19 DIAGNOSIS — Z87442 Personal history of urinary calculi: Secondary | ICD-10-CM | POA: Insufficient documentation

## 2013-11-19 DIAGNOSIS — J4489 Other specified chronic obstructive pulmonary disease: Secondary | ICD-10-CM | POA: Insufficient documentation

## 2013-11-19 DIAGNOSIS — IMO0002 Reserved for concepts with insufficient information to code with codable children: Secondary | ICD-10-CM | POA: Insufficient documentation

## 2013-11-19 DIAGNOSIS — Z79899 Other long term (current) drug therapy: Secondary | ICD-10-CM | POA: Insufficient documentation

## 2013-11-19 DIAGNOSIS — R109 Unspecified abdominal pain: Secondary | ICD-10-CM | POA: Insufficient documentation

## 2013-11-19 DIAGNOSIS — Z8701 Personal history of pneumonia (recurrent): Secondary | ICD-10-CM | POA: Insufficient documentation

## 2013-11-19 DIAGNOSIS — I509 Heart failure, unspecified: Secondary | ICD-10-CM | POA: Insufficient documentation

## 2013-11-19 DIAGNOSIS — E119 Type 2 diabetes mellitus without complications: Secondary | ICD-10-CM | POA: Insufficient documentation

## 2013-11-19 DIAGNOSIS — R112 Nausea with vomiting, unspecified: Secondary | ICD-10-CM

## 2013-11-19 DIAGNOSIS — Z8719 Personal history of other diseases of the digestive system: Secondary | ICD-10-CM | POA: Insufficient documentation

## 2013-11-19 DIAGNOSIS — F411 Generalized anxiety disorder: Secondary | ICD-10-CM | POA: Insufficient documentation

## 2013-11-19 DIAGNOSIS — I10 Essential (primary) hypertension: Secondary | ICD-10-CM | POA: Insufficient documentation

## 2013-11-19 DIAGNOSIS — E785 Hyperlipidemia, unspecified: Secondary | ICD-10-CM | POA: Insufficient documentation

## 2013-11-19 DIAGNOSIS — Z87891 Personal history of nicotine dependence: Secondary | ICD-10-CM | POA: Insufficient documentation

## 2013-11-19 DIAGNOSIS — J449 Chronic obstructive pulmonary disease, unspecified: Secondary | ICD-10-CM | POA: Insufficient documentation

## 2013-11-19 LAB — URINALYSIS, ROUTINE W REFLEX MICROSCOPIC
BILIRUBIN URINE: NEGATIVE
Glucose, UA: >1000 mg/dL — AB
HGB URINE DIPSTICK: NEGATIVE
Ketones, ur: NEGATIVE mg/dL
Leukocytes, UA: NEGATIVE
Nitrite: NEGATIVE
PROTEIN: NEGATIVE mg/dL
SPECIFIC GRAVITY, URINE: 1.03 (ref 1.005–1.030)
UROBILINOGEN UA: 0.2 mg/dL (ref 0.0–1.0)
pH: 5.5 (ref 5.0–8.0)

## 2013-11-19 LAB — CBC WITH DIFFERENTIAL/PLATELET
Basophils Absolute: 0.1 10*3/uL (ref 0.0–0.1)
Basophils Relative: 1 % (ref 0–1)
EOS ABS: 0.1 10*3/uL (ref 0.0–0.7)
Eosinophils Relative: 2 % (ref 0–5)
HEMATOCRIT: 40.2 % (ref 39.0–52.0)
HEMOGLOBIN: 14 g/dL (ref 13.0–17.0)
LYMPHS ABS: 1.6 10*3/uL (ref 0.7–4.0)
LYMPHS PCT: 39 % (ref 12–46)
MCH: 29.9 pg (ref 26.0–34.0)
MCHC: 34.8 g/dL (ref 30.0–36.0)
MCV: 85.9 fL (ref 78.0–100.0)
MONOS PCT: 9 % (ref 3–12)
Monocytes Absolute: 0.4 10*3/uL (ref 0.1–1.0)
Neutro Abs: 2 10*3/uL (ref 1.7–7.7)
Neutrophils Relative %: 49 % (ref 43–77)
Platelets: 202 10*3/uL (ref 150–400)
RBC: 4.68 MIL/uL (ref 4.22–5.81)
RDW: 13.2 % (ref 11.5–15.5)
WBC: 4.2 10*3/uL (ref 4.0–10.5)

## 2013-11-19 LAB — COMPREHENSIVE METABOLIC PANEL
ALK PHOS: 48 U/L (ref 39–117)
ALT: 40 U/L (ref 0–53)
AST: 40 U/L — ABNORMAL HIGH (ref 0–37)
Albumin: 4.3 g/dL (ref 3.5–5.2)
BUN: 12 mg/dL (ref 6–23)
CALCIUM: 9.6 mg/dL (ref 8.4–10.5)
CO2: 26 mEq/L (ref 19–32)
Chloride: 98 mEq/L (ref 96–112)
Creatinine, Ser: 0.7 mg/dL (ref 0.50–1.35)
GFR calc non Af Amer: >90 mL/min (ref >90)
Glucose, Bld: 270 mg/dL — ABNORMAL HIGH (ref 70–99)
Potassium: 3.8 mEq/L (ref 3.7–5.3)
Sodium: 139 mEq/L (ref 137–147)
TOTAL PROTEIN: 7.1 g/dL (ref 6.0–8.3)
Total Bilirubin: 0.3 mg/dL (ref 0.3–1.2)

## 2013-11-19 LAB — URINE MICROSCOPIC-ADD ON

## 2013-11-19 LAB — TROPONIN I: Troponin I: <0.3 ng/mL (ref <0.30)

## 2013-11-19 LAB — LIPASE, BLOOD: LIPASE: 38 U/L (ref 11–59)

## 2013-11-19 LAB — PRO B NATRIURETIC PEPTIDE: Pro B Natriuretic peptide (BNP): 16.3 pg/mL (ref 0–125)

## 2013-11-19 MED ORDER — HYDROMORPHONE HCL PF 1 MG/ML IJ SOLN
1.0000 mg | Freq: Once | INTRAMUSCULAR | Status: AC
  Administered 2013-11-19: 1 mg via INTRAVENOUS
  Filled 2013-11-19: qty 1

## 2013-11-19 MED ORDER — HYDROCODONE-ACETAMINOPHEN 10-325 MG PO TABS: 1.0000 | ORAL_TABLET | Freq: Four times a day (QID) | ORAL | Status: DC | PRN

## 2013-11-19 MED ORDER — ONDANSETRON HCL 4 MG/2ML IJ SOLN
4.0000 mg | Freq: Once | INTRAMUSCULAR | Status: AC
  Administered 2013-11-19: 4 mg via INTRAVENOUS

## 2013-11-19 MED ORDER — ONDANSETRON HCL 4 MG/2ML IJ SOLN
4.0000 mg | Freq: Once | INTRAMUSCULAR | Status: DC
  Filled 2013-11-19: qty 2

## 2013-11-19 NOTE — ED Provider Notes (Signed)
CSN: [id number]     Arrival date & time 11/19/13  1647 History   First MD Initiated Contact with Patient 11/19/13 1657     Chief Complaint  Patient presents with  . Emesis     (Consider location/radiation/quality/duration/timing/severity/associated sxs/prior Treatment) Patient is a 53 y.o. male presenting with vomiting. The history is provided by the patient. No language interpreter was used.  Emesis Severity:  Moderate Duration:  1 week Timing:  Constant Number of daily episodes:  Multiple Able to tolerate:  Liquids Progression:  Worsening Chronicity:  New Recent urination:  Normal Relieved by:  Nothing Worsened by:  Nothing tried Ineffective treatments:  None tried Associated symptoms: abdominal pain   Risk factors: diabetes   Pt has a history of liver disease, diabetes, hypertension and chf  Past Medical History  Diagnosis Date  . Diabetes mellitus   . Hypertension   . Pancreatitis   . Kidney stone   . CHF (congestive heart failure)   . Pneumonia   . 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 Neck pain   . Seasonal allergies   . Asthma   . Anxiety   . COPD (chronic obstructive pulmonary disease)   . Hyperlipidemia   . Gastric ulcer   . Fibromyalgia   . OSA (obstructive sleep apnea) 04/24/2013   Past Surgical History  Procedure Laterality Date  . Cholecystectomy    . Multiple cysts removal-hip,wrist    . Upper gastrointestinal endoscopy    . Colonoscopy     Family History  Problem Relation Age of Onset  . Colon cancer Neg Hx   . Esophageal cancer Neg Hx   . Rectal cancer Neg Hx   . Skin cancer Father   . Lung cancer Paternal Grandfather   . Stomach cancer Maternal Grandfather   . Bone cancer Paternal Grandmother   . Diabetes Mother   . Diabetes Maternal Grandmother   . Heart disease Maternal Grandmother   . Heart disease Father   . Heart disease Paternal Grandfather   . Heart attack Paternal Grandmother    History  Substance Use Topics   . Smoking status: Former Smoker    Types: Cigarettes    Quit date: 05/23/2002  . Smokeless tobacco: Never Used  . Alcohol Use: No    Review of Systems  Gastrointestinal: Positive for nausea, vomiting and abdominal pain.  All other systems reviewed and are negative.     Allergies  Codeine  Home Medications   Prior to Admission medications   Medication Sig Start Date End Date Taking? Authorizing Provider  albuterol (PROVENTIL HFA;VENTOLIN HFA) 108 (90 BASE) MCG/ACT inhaler Inhale 2 puffs into the lungs every 6 (six) hours as needed. For shortness of breath     Historical Provider, MD  citalopram (CELEXA) 40 MG tablet Take 40 mg by mouth daily.      Historical Provider, MD  clopidogrel (PLAVIX) 75 MG tablet Take 1 tablet (75 mg total) by mouth daily with breakfast. 04/24/13   Abelina Bachelor, PA-C  cyanocobalamin (,VITAMIN B-12,) 1000 MCG/ML injection Inject 1,000 mcg into the muscle once a week.     Historical Provider, MD  donepezil (ARICEPT) 10 MG tablet Take 10 mg by mouth at bedtime.    Historical Provider, MD  fluticasone (FLONASE) 50 MCG/ACT nasal spray Place 2 sprays into the nose daily. 06/17/12   April K Palumbo-Rasch, MD  furosemide (LASIX) 40 MG tablet Take 40 mg by mouth 2 (two) times daily.  Historical Provider, MD  gabapentin (NEURONTIN) 400 MG capsule Take 600 mg by mouth 3 (three) times daily.     Historical Provider, MD  glimepiride (AMARYL) 4 MG tablet Take 2 mg by mouth daily with breakfast.     Historical Provider, MD  Liraglutide (VICTOZA) 18 MG/3ML SOLN Inject 1.8 mg into the skin daily.     Historical Provider, MD  lisinopril (PRINIVIL,ZESTRIL) 40 MG tablet Take 40 mg by mouth 2 (two) times daily.     Historical Provider, MD  LORazepam (ATIVAN) 1 MG tablet Take 1 mg by mouth 2 (two) times daily.      Historical Provider, MD  omeprazole (PRILOSEC) 20 MG capsule Take 20 mg by mouth daily.    Historical Provider, MD  ondansetron (ZOFRAN ODT) 4 MG disintegrating  tablet Take 1 tablet (4 mg total) by mouth every 8 (eight) hours as needed for nausea. 11/21/12   Allie Bossier, MD  Potassium Gluconate 595 MG CAPS Take 595 mg by mouth daily.     Historical Provider, MD  tiotropium (SPIRIVA) 18 MCG inhalation capsule Place 18 mcg into inhaler and inhale 2 (two) times daily as needed (wheezing).    Historical Provider, MD   BP 123/73  Pulse 85  Temp(Src) 98.3 F (36.8 C) (Oral)  Resp 18  Ht 5' 9"  (1.753 m)  Wt 270 lb (122.471 kg)  BMI 39.85 kg/m2  SpO2 97% Physical Exam  Nursing note and vitals reviewed. Constitutional: He is oriented to person, place, and time. He appears well-developed and well-nourished.  HENT:  Head: Normocephalic.  Right Ear: External ear normal.  Left Ear: External ear normal.  Nose: Nose normal.  Mouth/Throat: Oropharynx is clear and moist.  Eyes: Conjunctivae and EOM are normal. Pupils are equal, round, and reactive to light.  Neck: Normal range of motion.  Cardiovascular: Normal rate and normal heart sounds.   Pulmonary/Chest: Effort normal.  Abdominal: Soft. He exhibits distension. There is tenderness.  Musculoskeletal: Normal range of motion.  Neurological: He is alert and oriented to person, place, and time.  Skin: Skin is warm.  Psychiatric: He has a normal mood and affect.    ED Course  Procedures (including critical care time) Labs Review Labs Reviewed  CBC WITH DIFFERENTIAL  COMPREHENSIVE METABOLIC PANEL  LIPASE, BLOOD  URINALYSIS, ROUTINE W REFLEX MICROSCOPIC  TROPONIN I  PRO B NATRIURETIC PEPTIDE    Imaging Review No results found.   EKG Interpretation None      MDM   Final diagnoses:  None   Results for orders placed during the hospital encounter of 11/19/13  CBC WITH DIFFERENTIAL      Result Value Ref Range   WBC 4.2  4.0 - 10.5 K/uL   RBC 4.68  4.22 - 5.81 MIL/uL   Hemoglobin 14.0  13.0 - 17.0 g/dL   HCT 40.2  39.0 - 52.0 %   MCV 85.9  78.0 - 100.0 fL   MCH 29.9  26.0 - 34.0 pg    MCHC 34.8  30.0 - 36.0 g/dL   RDW 13.2  11.5 - 15.5 %   Platelets 202  150 - 400 K/uL   Neutrophils Relative % 49  43 - 77 %   Neutro Abs 2.0  1.7 - 7.7 K/uL   Lymphocytes Relative 39  12 - 46 %   Lymphs Abs 1.6  0.7 - 4.0 K/uL   Monocytes Relative 9  3 - 12 %   Monocytes Absolute 0.4  0.1 - 1.0  K/uL   Eosinophils Relative 2  0 - 5 %   Eosinophils Absolute 0.1  0.0 - 0.7 K/uL   Basophils Relative 1  0 - 1 %   Basophils Absolute 0.1  0.0 - 0.1 K/uL  COMPREHENSIVE METABOLIC PANEL      Result Value Ref Range   Sodium 139  137 - 147 mEq/L   Potassium 3.8  3.7 - 5.3 mEq/L   Chloride 98  96 - 112 mEq/L   CO2 26  19 - 32 mEq/L   Glucose, Bld 270 (*) 70 - 99 mg/dL   BUN 12  6 - 23 mg/dL   Creatinine, Ser 0.70  0.50 - 1.35 mg/dL   Calcium 9.6  8.4 - 10.5 mg/dL   Total Protein 7.1  6.0 - 8.3 g/dL   Albumin 4.3  3.5 - 5.2 g/dL   AST 40 (*) 0 - 37 U/L   ALT 40  0 - 53 U/L   Alkaline Phosphatase 48  39 - 117 U/L   Total Bilirubin 0.3  0.3 - 1.2 mg/dL   GFR calc non Af Amer >90  >90 mL/min   GFR calc Af Amer >90  >90 mL/min  LIPASE, BLOOD      Result Value Ref Range   Lipase 38  11 - 59 U/L  URINALYSIS, ROUTINE W REFLEX MICROSCOPIC      Result Value Ref Range   Color, Urine YELLOW  YELLOW   APPearance CLEAR  CLEAR   Specific Gravity, Urine 1.030  1.005 - 1.030   pH 5.5  5.0 - 8.0   Glucose, UA >1000 (*) NEGATIVE mg/dL   Hgb urine dipstick NEGATIVE  NEGATIVE   Bilirubin Urine NEGATIVE  NEGATIVE   Ketones, ur NEGATIVE  NEGATIVE mg/dL   Protein, ur NEGATIVE  NEGATIVE mg/dL   Urobilinogen, UA 0.2  0.0 - 1.0 mg/dL   Nitrite NEGATIVE  NEGATIVE   Leukocytes, UA NEGATIVE  NEGATIVE  TROPONIN I      Result Value Ref Range   Troponin I <0.30  <0.30 ng/mL  PRO B NATRIURETIC PEPTIDE      Result Value Ref Range   Pro B Natriuretic peptide (BNP) 16.3  0 - 125 pg/mL  URINE MICROSCOPIC-ADD ON      Result Value Ref Range   RBC / HPF 0-2  <3 RBC/hpf   Urine-Other MUCOUS PRESENT     Dg Chest  2 View  11/19/2013   CLINICAL DATA:  Right-sided chest pain.  Vomiting.  EXAM: CHEST  2 VIEW  COMPARISON:  10/24/2013  FINDINGS: The heart size and mediastinal contours are within normal limits. Both lungs are clear. The visualized skeletal structures are unremarkable.  IMPRESSION: No active cardiopulmonary disease.   Electronically Signed   By: Earle Gell M.D.   On: 11/19/2013 18:10   Dg Chest 2 View  10/24/2013   CLINICAL DATA:  Shortness of breath.  COPD  EXAM: CHEST  2 VIEW  COMPARISON:  04/20/2013  FINDINGS: The heart size and mediastinal contours are within normal limits. Both lungs are clear. Spondylosis noted within the thoracic spine.  IMPRESSION: No active cardiopulmonary disease.   Electronically Signed   By: Kerby Moors M.D.   On: 10/24/2013 16:06   Ct Abdomen Pelvis W Contrast  10/24/2013   CLINICAL DATA:  ABDOMINAL PAIN  EXAM: CT ABDOMEN AND PELVIS WITH CONTRAST  TECHNIQUE: Multidetector CT imaging of the abdomen and pelvis was performed using the standard protocol following bolus administration of  intravenous contrast.  CONTRAST:  36m OMNIPAQUE IOHEXOL 300 MG/ML SOLN, 1066mOMNIPAQUE IOHEXOL 300 MG/ML SOLN  COMPARISON:  CT chest, abdomen, and pelvis dated 11/19/2012  FINDINGS: All linear areas increased density within the lung bases.  The liver, spleen, adrenals, pancreas, kidneys are unremarkable.  No evidence of abdominal aortic aneurysm. The celiac, SMA, IMA, portal vein are opacified. Patient is status post cholecystectomy.  A stable 10 mm mesenteric lymph node left mid abdomen. Image 47 series 2. No further evidence of abdominal or pelvic adenopathy, masses, free fluid, or loculated fluid collections. There is no evidence of abdominal wall nor inguinal hernia.  The bowel is negative. There no aggressive appearing osseous lesions.  IMPRESSION: Minimal atelectasis versus scarring within the lung bases.  Stable 10 mm there is direct lymph node. No further evidence of abdominal or pelvic  pathology. No CT evidence accounting for the patient's clinical presentation.   Electronically Signed   By: HeMargaree Mackintosh.D.   On: 10/24/2013 17:09     Pt given iv fluids, dilaudid and zofran.   Dr. PoWaverly Ferrarin to see and examine.  ASt is 40  Glucose 270    Pt advised to follow up with his primary care MD for recheck.   Pt advised to continue to zoPort NechesPA-C 11/19/13 [place] [phone]

## 2013-11-19 NOTE — ED Notes (Signed)
C/o nausea x 1 week-vomiting started yesterday-c/o abd pain-chronic

## 2013-11-19 NOTE — ED Notes (Signed)
IV fluid bolus infusing per PA orders, pt tolerating well. IV site unremarkable.

## 2013-11-19 NOTE — ED Notes (Signed)
Pt c/o leg cramping d/t his low potassium, pt made aware that his K+ was normal and the IV fluids we were hydrating him with should help with his cramps. Pt agrees with plan of care.

## 2013-11-19 NOTE — Discharge Instructions (Signed)

## 2013-11-20 NOTE — ED Provider Notes (Signed)
Medical screening examination/treatment/procedure(s) were performed by non-physician practitioner and as supervising physician I was immediately available for consultation/collaboration.   EKG Interpretation   Date/Time:  Tuesday November 19 2013 18:46:23 EDT Ventricular Rate:  73 PR Interval:  124 QRS Duration: 86 QT Interval:  396 QTC Calculation: 436 R Axis:   71 Text Interpretation:  Normal sinus rhythm Normal ECG Confirmed by Betsey Holiday   MD, CHRISTOPHER ([id number]) on 11/19/2013 7:59:01 PM        Orpah Greek, MD 11/20/13 1554

## 2013-11-25 ENCOUNTER — Emergency Department (HOSPITAL_BASED_OUTPATIENT_CLINIC_OR_DEPARTMENT_OTHER)
Admission: EM | Admit: 2013-11-25 | Discharge: 2013-11-25 | Disposition: A | Discharge status: Home / Self Care | Payer: Medicaid Other | Attending: Emergency Medicine | Admitting: Emergency Medicine

## 2013-11-25 ENCOUNTER — Encounter (HOSPITAL_BASED_OUTPATIENT_CLINIC_OR_DEPARTMENT_OTHER): Payer: Self-pay | Admitting: Emergency Medicine

## 2013-11-25 ENCOUNTER — Emergency Department (HOSPITAL_BASED_OUTPATIENT_CLINIC_OR_DEPARTMENT_OTHER): Payer: Medicaid Other

## 2013-11-25 DIAGNOSIS — R197 Diarrhea, unspecified: Secondary | ICD-10-CM | POA: Diagnosis present

## 2013-11-25 DIAGNOSIS — Z79899 Other long term (current) drug therapy: Secondary | ICD-10-CM | POA: Diagnosis not present

## 2013-11-25 DIAGNOSIS — J4489 Other specified chronic obstructive pulmonary disease: Secondary | ICD-10-CM | POA: Insufficient documentation

## 2013-11-25 DIAGNOSIS — I509 Heart failure, unspecified: Secondary | ICD-10-CM | POA: Diagnosis not present

## 2013-11-25 DIAGNOSIS — J449 Chronic obstructive pulmonary disease, unspecified: Secondary | ICD-10-CM | POA: Insufficient documentation

## 2013-11-25 DIAGNOSIS — Z8701 Personal history of pneumonia (recurrent): Secondary | ICD-10-CM | POA: Insufficient documentation

## 2013-11-25 DIAGNOSIS — I10 Essential (primary) hypertension: Secondary | ICD-10-CM | POA: Diagnosis not present

## 2013-11-25 DIAGNOSIS — IMO0002 Reserved for concepts with insufficient information to code with codable children: Secondary | ICD-10-CM | POA: Diagnosis not present

## 2013-11-25 DIAGNOSIS — R112 Nausea with vomiting, unspecified: Secondary | ICD-10-CM | POA: Diagnosis not present

## 2013-11-25 DIAGNOSIS — Z7902 Long term (current) use of antithrombotics/antiplatelets: Secondary | ICD-10-CM | POA: Diagnosis not present

## 2013-11-25 DIAGNOSIS — Z8719 Personal history of other diseases of the digestive system: Secondary | ICD-10-CM | POA: Diagnosis not present

## 2013-11-25 DIAGNOSIS — Z87891 Personal history of nicotine dependence: Secondary | ICD-10-CM | POA: Diagnosis not present

## 2013-11-25 DIAGNOSIS — Z87442 Personal history of urinary calculi: Secondary | ICD-10-CM | POA: Diagnosis not present

## 2013-11-25 DIAGNOSIS — Z794 Long term (current) use of insulin: Secondary | ICD-10-CM | POA: Diagnosis not present

## 2013-11-25 DIAGNOSIS — F411 Generalized anxiety disorder: Secondary | ICD-10-CM | POA: Diagnosis not present

## 2013-11-25 DIAGNOSIS — E119 Type 2 diabetes mellitus without complications: Secondary | ICD-10-CM | POA: Insufficient documentation

## 2013-11-25 LAB — CBC WITH DIFFERENTIAL/PLATELET
Basophils Absolute: 0.1 10*3/uL (ref 0.0–0.1)
Basophils Relative: 1 % (ref 0–1)
EOS ABS: 0.1 10*3/uL (ref 0.0–0.7)
Eosinophils Relative: 2 % (ref 0–5)
HCT: 39.8 % (ref 39.0–52.0)
HEMOGLOBIN: 14 g/dL (ref 13.0–17.0)
LYMPHS ABS: 1.7 10*3/uL (ref 0.7–4.0)
LYMPHS PCT: 33 % (ref 12–46)
MCH: 30 pg (ref 26.0–34.0)
MCHC: 35.2 g/dL (ref 30.0–36.0)
MCV: 85.4 fL (ref 78.0–100.0)
MONOS PCT: 9 % (ref 3–12)
Monocytes Absolute: 0.5 10*3/uL (ref 0.1–1.0)
NEUTROS ABS: 2.7 10*3/uL (ref 1.7–7.7)
NEUTROS PCT: 54 % (ref 43–77)
Platelets: 188 10*3/uL (ref 150–400)
RBC: 4.66 MIL/uL (ref 4.22–5.81)
RDW: 13.3 % (ref 11.5–15.5)
WBC: 5 10*3/uL (ref 4.0–10.5)

## 2013-11-25 LAB — COMPREHENSIVE METABOLIC PANEL
ALT: 30 U/L (ref 0–53)
AST: 22 U/L (ref 0–37)
Albumin: 4 g/dL (ref 3.5–5.2)
Alkaline Phosphatase: 55 U/L (ref 39–117)
Anion gap: 13 (ref 5–15)
BUN: 11 mg/dL (ref 6–23)
CALCIUM: 9 mg/dL (ref 8.4–10.5)
CO2: 25 mEq/L (ref 19–32)
Chloride: 102 mEq/L (ref 96–112)
Creatinine, Ser: 0.7 mg/dL (ref 0.50–1.35)
GFR calc Af Amer: >90 mL/min (ref >90)
GFR calc non Af Amer: >90 mL/min (ref >90)
GLUCOSE: 316 mg/dL — AB (ref 70–99)
Potassium: 3.7 mEq/L (ref 3.7–5.3)
SODIUM: 140 meq/L (ref 137–147)
Total Bilirubin: 0.2 mg/dL — ABNORMAL LOW (ref 0.3–1.2)
Total Protein: 7 g/dL (ref 6.0–8.3)

## 2013-11-25 LAB — CBG MONITORING, ED: Glucose-Capillary: 199 mg/dL — ABNORMAL HIGH (ref 70–99)

## 2013-11-25 LAB — LIPASE, BLOOD: Lipase: 44 U/L (ref 11–59)

## 2013-11-25 MED ORDER — SODIUM CHLORIDE 0.9 % IV BOLUS (SEPSIS)
2000.0000 mL | Freq: Once | INTRAVENOUS | Status: AC
  Administered 2013-11-25: 2000 mL via INTRAVENOUS

## 2013-11-25 MED ORDER — ONDANSETRON 8 MG PO TBDP
8.0000 mg | ORAL_TABLET | Freq: Once | ORAL | Status: AC
Start: 2013-11-25 — End: 2013-11-25
  Administered 2013-11-25: 8 mg via ORAL
  Filled 2013-11-25: qty 1

## 2013-11-25 MED ORDER — ONDANSETRON 4 MG PO TBDP
ORAL_TABLET | ORAL | Status: AC
  Filled 2013-11-25: qty 1

## 2013-11-25 MED ORDER — HYDROCODONE-ACETAMINOPHEN 5-325 MG PO TABS
ORAL_TABLET | ORAL | Status: AC
  Filled 2013-11-25: qty 1

## 2013-11-25 MED ORDER — HYDROCODONE-ACETAMINOPHEN 5-325 MG PO TABS
1.0000 | ORAL_TABLET | Freq: Once | ORAL | Status: AC
Start: 2013-11-25 — End: 2013-11-25
  Administered 2013-11-25: 1 via ORAL
  Filled 2013-11-25: qty 1

## 2013-11-25 NOTE — ED Notes (Signed)
Pt. Was seen at Dr. Gabriel Carina today and was told to come to ed for 2 bags of NS

## 2013-11-25 NOTE — ED Provider Notes (Signed)
CSN: [id number]     Arrival date & time 11/25/13  1443 History  This chart was scribed for Orlie Dakin, MD by Vernell Barrier, ED scribe. This patient was seen in room MH03/MH03 and the patient's care was started at 4:18 PM.  Chief Complaint  Patient presents with  . Diarrhea    The history is provided by the patient. No language interpreter was used.   HPI Comments: Bryan Becker is a 53 y.o. male w/ hx of CHF, diabetes, pancreatitis, cirrhosis, cholecystectomy, and stomach ulcer presents to the Emergency Department complaining of diarrhea; onset 2 days ago. Reports associated nausea and emesis. Last episode of emesis 1 day prior; nausea still present. Several months of intermittent abdominal pain and distention. Altogether states he has had this abdominal pain for approxiamtely 1 year; pain is present daily. No diagnosis for this pain. Taking Hydrocodone and Tramadol daily for pain. Has had similar sxs in prior weeks; seen by PCP today and told to come to the ED to receive iv ns  2 liters. Checked sugar this morning and reports it was 300. Has been taking Victoza for the past 3 months. No recent antibiotics. No recent travel. Allergic to codeine. Does not drink; quit smoking 10 years ago. Denies hematochezia, hematemesis, or dysuria.   Past Medical History  Diagnosis Date  . Diabetes mellitus   . Hypertension   . Pancreatitis   . Kidney stone   . CHF (congestive heart failure)   . Pneumonia   . 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 Neck pain   . Seasonal allergies   . Asthma   . Anxiety   . COPD (chronic obstructive pulmonary disease)   . Hyperlipidemia   . Gastric ulcer   . Fibromyalgia   . OSA (obstructive sleep apnea) 04/24/2013   Past Surgical History  Procedure Laterality Date  . Cholecystectomy    . Multiple cysts removal-hip,wrist    . Upper gastrointestinal endoscopy    . Colonoscopy     Family History  Problem Relation Age of Onset  . Colon cancer  Neg Hx   . Esophageal cancer Neg Hx   . Rectal cancer Neg Hx   . Skin cancer Father   . Lung cancer Paternal Grandfather   . Stomach cancer Maternal Grandfather   . Bone cancer Paternal Grandmother   . Diabetes Mother   . Diabetes Maternal Grandmother   . Heart disease Maternal Grandmother   . Heart disease Father   . Heart disease Paternal Grandfather   . Heart attack Paternal Grandmother    History  Substance Use Topics  . Smoking status: Former Smoker    Types: Cigarettes    Quit date: 05/23/2002  . Smokeless tobacco: Never Used  . Alcohol Use: No    Review of Systems  Constitutional: Negative.   HENT: Negative.   Respiratory: Negative.   Cardiovascular: Negative.   Gastrointestinal: Positive for nausea, vomiting, abdominal pain, diarrhea and abdominal distention. Negative for blood in stool.  Genitourinary: Negative for dysuria.  Musculoskeletal: Negative.   Skin: Negative.   Neurological: Negative.   Psychiatric/Behavioral: Negative.    Allergies  Codeine  Home Medications   Prior to Admission medications   Medication Sig Start Date End Date Taking? Authorizing Provider  insulin detemir (LEVEMIR) 100 UNIT/ML injection Inject 60 Units into the skin 2 (two) times daily.   Yes Historical Provider, MD  insulin lispro protamine-lispro (HUMALOG 50/50 MIX) (50-50) 100 UNIT/ML SUSP injection Inject into  the skin 2 (two) times daily before a meal.   Yes Historical Provider, MD  albuterol (PROVENTIL HFA;VENTOLIN HFA) 108 (90 BASE) MCG/ACT inhaler Inhale 2 puffs into the lungs every 6 (six) hours as needed. For shortness of breath     Historical Provider, MD  citalopram (CELEXA) 40 MG tablet Take 40 mg by mouth daily.      Historical Provider, MD  clopidogrel (PLAVIX) 75 MG tablet Take 1 tablet (75 mg total) by mouth daily with breakfast. 04/24/13   Abelina Bachelor, PA-C  cyanocobalamin (,VITAMIN B-12,) 1000 MCG/ML injection Inject 1,000 mcg into the muscle once a week.      Historical Provider, MD  donepezil (ARICEPT) 10 MG tablet Take 10 mg by mouth at bedtime.    Historical Provider, MD  fluticasone (FLONASE) 50 MCG/ACT nasal spray Place 2 sprays into the nose daily. 06/17/12   April K Palumbo-Rasch, MD  furosemide (LASIX) 40 MG tablet Take 40 mg by mouth 2 (two) times daily.      Historical Provider, MD  gabapentin (NEURONTIN) 400 MG capsule Take 600 mg by mouth 3 (three) times daily.     Historical Provider, MD  glimepiride (AMARYL) 4 MG tablet Take 2 mg by mouth daily with breakfast.     Historical Provider, MD  HYDROcodone-acetaminophen (NORCO) 10-325 MG per tablet Take 1 tablet by mouth every 6 (six) hours as needed. 11/19/13   Fransico Meadow, PA-C  Liraglutide (VICTOZA) 18 MG/3ML SOLN Inject 1.8 mg into the skin daily.     Historical Provider, MD  lisinopril (PRINIVIL,ZESTRIL) 40 MG tablet Take 40 mg by mouth 2 (two) times daily.     Historical Provider, MD  LORazepam (ATIVAN) 1 MG tablet Take 1 mg by mouth 2 (two) times daily.      Historical Provider, MD  omeprazole (PRILOSEC) 20 MG capsule Take 20 mg by mouth daily.    Historical Provider, MD  ondansetron (ZOFRAN ODT) 4 MG disintegrating tablet Take 1 tablet (4 mg total) by mouth every 8 (eight) hours as needed for nausea. 11/21/12   Allie Bossier, MD  Potassium Gluconate 595 MG CAPS Take 595 mg by mouth daily.     Historical Provider, MD  tiotropium (SPIRIVA) 18 MCG inhalation capsule Place 18 mcg into inhaler and inhale 2 (two) times daily as needed (wheezing).    Historical Provider, MD   Triage vitals: BP 148/71  Pulse 94  Temp(Src) 98.1 F (36.7 C)  Resp 18  Ht 5' 9"  (1.753 m)  Wt 275 lb (124.739 kg)  BMI 40.59 kg/m2  SpO2 99%  Physical Exam  Nursing note and vitals reviewed. Constitutional: He is oriented to person, place, and time. He appears well-developed and well-nourished. No distress.  HENT:  Head: Normocephalic and atraumatic.  Eyes: Conjunctivae are normal. Pupils are equal, round,  and reactive to light.  Neck: Neck supple. No tracheal deviation present. No thyromegaly present.  Cardiovascular: Normal rate and regular rhythm.   No murmur heard. Pulmonary/Chest: Effort normal and breath sounds normal.  Abdominal: Soft. Bowel sounds are normal. He exhibits no distension. There is no tenderness.  Morbidly obese, mild diffuse tenderness  Genitourinary: Penis normal.  Musculoskeletal: Normal range of motion. He exhibits no edema and no tenderness.  Neurological: He is alert and oriented to person, place, and time. Coordination normal.  Skin: Skin is warm and dry. No rash noted.  Psychiatric: He has a normal mood and affect.    ED Course  Procedures (including critical  care time) DIAGNOSTIC STUDIES: Oxygen Saturation is 99% on room air, normal by my interpretation.    COORDINATION OF CARE: At 4:24 PM: Discussed treatment plan with patient which includes CT scan of the abdomen and IV fluids. Patient agrees.   Labs Review Results for orders placed during the hospital encounter of 11/25/13  COMPREHENSIVE METABOLIC PANEL      Result Value Ref Range   Sodium 140  137 - 147 mEq/L   Potassium 3.7  3.7 - 5.3 mEq/L   Chloride 102  96 - 112 mEq/L   CO2 25  19 - 32 mEq/L   Glucose, Bld 316 (*) 70 - 99 mg/dL   BUN 11  6 - 23 mg/dL   Creatinine, Ser 0.70  0.50 - 1.35 mg/dL   Calcium 9.0  8.4 - 10.5 mg/dL   Total Protein 7.0  6.0 - 8.3 g/dL   Albumin 4.0  3.5 - 5.2 g/dL   AST 22  0 - 37 U/L   ALT 30  0 - 53 U/L   Alkaline Phosphatase 55  39 - 117 U/L   Total Bilirubin 0.2 (*) 0.3 - 1.2 mg/dL   GFR calc non Af Amer >90  >90 mL/min   GFR calc Af Amer >90  >90 mL/min   Anion gap 13  5 - 15  CBC WITH DIFFERENTIAL      Result Value Ref Range   WBC 5.0  4.0 - 10.5 K/uL   RBC 4.66  4.22 - 5.81 MIL/uL   Hemoglobin 14.0  13.0 - 17.0 g/dL   HCT 39.8  39.0 - 52.0 %   MCV 85.4  78.0 - 100.0 fL   MCH 30.0  26.0 - 34.0 pg   MCHC 35.2  30.0 - 36.0 g/dL   RDW 13.3  11.5 - 15.5  %   Platelets 188  150 - 400 K/uL   Neutrophils Relative % 54  43 - 77 %   Neutro Abs 2.7  1.7 - 7.7 K/uL   Lymphocytes Relative 33  12 - 46 %   Lymphs Abs 1.7  0.7 - 4.0 K/uL   Monocytes Relative 9  3 - 12 %   Monocytes Absolute 0.5  0.1 - 1.0 K/uL   Eosinophils Relative 2  0 - 5 %   Eosinophils Absolute 0.1  0.0 - 0.7 K/uL   Basophils Relative 1  0 - 1 %   Basophils Absolute 0.1  0.0 - 0.1 K/uL  LIPASE, BLOOD      Result Value Ref Range   Lipase 44  11 - 59 U/L   Imaging Review Dg Abd Acute W/chest  11/25/2013   CLINICAL DATA:  Pain and vomiting.  Dehydration.  EXAM: ACUTE ABDOMEN SERIES (ABDOMEN 2 VIEW & CHEST 1 VIEW)  COMPARISON:  Two-view chest 11/19/2013.  FINDINGS: The heart size is normal. Chronic interstitial coarsening is stable.  Supine and upright views the abdomen demonstrate a nonspecific bowel gas pattern. There is no evidence for obstruction or free air. The axial skeleton is unremarkable.  IMPRESSION: Negative chest and abdomen.   Electronically Signed   By: Lawrence Santiago M.D.   On: 11/25/2013 16:52     EKG Interpretation None     7:20 PM patient feels much improved after treatment with intravenous hydration, opioids and antiemetics. He is able to drink without difficulty MDM  . Patient states repeatedly that his abdominal pain is chronic and unchanged today over several years. Plan patient instructed to withhold  Lasix until he speaks with Dr. Jimmye Norman, his PMD tomorrow Final diagnoses:  None   diagnosis #1 nausea vomiting diarrhea #2 chronic abdominal pain #3 hyperglycemia I personally performed the services described in this documentation, which was scribed in my presence. The recorded information has been reviewed and is accurate.     Orlie Dakin, MD 11/25/13 [phone]

## 2013-11-25 NOTE — Discharge Instructions (Signed)
Your blood sugar upon arrival today was elevated at 316. He came down to 199 after treatment with intravenous fluids. Did not take any Lasix (Furosemide) tomorrow . Call Dr. Jimmye Norman tomorrow for followup. Your medicine dosages may need to be adjusted

## 2013-11-25 NOTE — ED Notes (Signed)
States he feels better and is ready to go home.

## 2013-12-04 ENCOUNTER — Observation Stay (HOSPITAL_BASED_OUTPATIENT_CLINIC_OR_DEPARTMENT_OTHER)
Admission: EM | Admit: 2013-12-04 | Discharge: 2013-12-05 | Disposition: A | Discharge status: Home / Self Care | Payer: Medicaid Other | Attending: Internal Medicine | Admitting: Internal Medicine

## 2013-12-04 ENCOUNTER — Emergency Department (HOSPITAL_BASED_OUTPATIENT_CLINIC_OR_DEPARTMENT_OTHER): Payer: Medicaid Other

## 2013-12-04 ENCOUNTER — Encounter (HOSPITAL_BASED_OUTPATIENT_CLINIC_OR_DEPARTMENT_OTHER): Payer: Self-pay | Admitting: Emergency Medicine

## 2013-12-04 DIAGNOSIS — E119 Type 2 diabetes mellitus without complications: Secondary | ICD-10-CM | POA: Diagnosis not present

## 2013-12-04 DIAGNOSIS — R0789 Other chest pain: Secondary | ICD-10-CM | POA: Diagnosis not present

## 2013-12-04 DIAGNOSIS — R531 Weakness: Secondary | ICD-10-CM

## 2013-12-04 DIAGNOSIS — Z87891 Personal history of nicotine dependence: Secondary | ICD-10-CM | POA: Diagnosis not present

## 2013-12-04 DIAGNOSIS — E139 Other specified diabetes mellitus without complications: Secondary | ICD-10-CM

## 2013-12-04 DIAGNOSIS — Z7902 Long term (current) use of antithrombotics/antiplatelets: Secondary | ICD-10-CM | POA: Diagnosis not present

## 2013-12-04 DIAGNOSIS — R197 Diarrhea, unspecified: Secondary | ICD-10-CM | POA: Diagnosis not present

## 2013-12-04 DIAGNOSIS — E785 Hyperlipidemia, unspecified: Secondary | ICD-10-CM | POA: Insufficient documentation

## 2013-12-04 DIAGNOSIS — R5381 Other malaise: Secondary | ICD-10-CM | POA: Diagnosis present

## 2013-12-04 DIAGNOSIS — R1031 Right lower quadrant pain: Secondary | ICD-10-CM | POA: Insufficient documentation

## 2013-12-04 DIAGNOSIS — M6281 Muscle weakness (generalized): Principal | ICD-10-CM | POA: Insufficient documentation

## 2013-12-04 DIAGNOSIS — R109 Unspecified abdominal pain: Secondary | ICD-10-CM

## 2013-12-04 DIAGNOSIS — IMO0002 Reserved for concepts with insufficient information to code with codable children: Secondary | ICD-10-CM | POA: Insufficient documentation

## 2013-12-04 DIAGNOSIS — Z87442 Personal history of urinary calculi: Secondary | ICD-10-CM | POA: Insufficient documentation

## 2013-12-04 DIAGNOSIS — R1110 Vomiting, unspecified: Secondary | ICD-10-CM | POA: Diagnosis not present

## 2013-12-04 DIAGNOSIS — E089 Diabetes mellitus due to underlying condition without complications: Secondary | ICD-10-CM

## 2013-12-04 DIAGNOSIS — J45901 Unspecified asthma with (acute) exacerbation: Secondary | ICD-10-CM

## 2013-12-04 DIAGNOSIS — J441 Chronic obstructive pulmonary disease with (acute) exacerbation: Secondary | ICD-10-CM | POA: Insufficient documentation

## 2013-12-04 DIAGNOSIS — K7581 Nonalcoholic steatohepatitis (NASH): Secondary | ICD-10-CM

## 2013-12-04 DIAGNOSIS — R209 Unspecified disturbances of skin sensation: Secondary | ICD-10-CM | POA: Diagnosis not present

## 2013-12-04 DIAGNOSIS — A770 Spotted fever due to Rickettsia rickettsii: Secondary | ICD-10-CM

## 2013-12-04 DIAGNOSIS — Z9282 Status post administration of tPA (rtPA) in a different facility within the last 24 hours prior to admission to current facility: Secondary | ICD-10-CM

## 2013-12-04 DIAGNOSIS — R51 Headache: Secondary | ICD-10-CM

## 2013-12-04 DIAGNOSIS — Z79899 Other long term (current) drug therapy: Secondary | ICD-10-CM

## 2013-12-04 DIAGNOSIS — J449 Chronic obstructive pulmonary disease, unspecified: Secondary | ICD-10-CM | POA: Diagnosis present

## 2013-12-04 DIAGNOSIS — K259 Gastric ulcer, unspecified as acute or chronic, without hemorrhage or perforation: Secondary | ICD-10-CM | POA: Diagnosis not present

## 2013-12-04 DIAGNOSIS — I10 Essential (primary) hypertension: Secondary | ICD-10-CM | POA: Insufficient documentation

## 2013-12-04 DIAGNOSIS — Z794 Long term (current) use of insulin: Secondary | ICD-10-CM | POA: Insufficient documentation

## 2013-12-04 DIAGNOSIS — Z8701 Personal history of pneumonia (recurrent): Secondary | ICD-10-CM | POA: Insufficient documentation

## 2013-12-04 DIAGNOSIS — G8191 Hemiplegia, unspecified affecting right dominant side: Secondary | ICD-10-CM | POA: Diagnosis present

## 2013-12-04 DIAGNOSIS — K859 Acute pancreatitis without necrosis or infection, unspecified: Secondary | ICD-10-CM | POA: Insufficient documentation

## 2013-12-04 DIAGNOSIS — R29898 Other symptoms and signs involving the musculoskeletal system: Secondary | ICD-10-CM

## 2013-12-04 DIAGNOSIS — Z9089 Acquired absence of other organs: Secondary | ICD-10-CM | POA: Insufficient documentation

## 2013-12-04 DIAGNOSIS — F411 Generalized anxiety disorder: Secondary | ICD-10-CM | POA: Insufficient documentation

## 2013-12-04 DIAGNOSIS — G4733 Obstructive sleep apnea (adult) (pediatric): Secondary | ICD-10-CM | POA: Diagnosis present

## 2013-12-04 DIAGNOSIS — I509 Heart failure, unspecified: Secondary | ICD-10-CM | POA: Diagnosis not present

## 2013-12-04 DIAGNOSIS — M797 Fibromyalgia: Secondary | ICD-10-CM

## 2013-12-04 DIAGNOSIS — I639 Cerebral infarction, unspecified: Secondary | ICD-10-CM

## 2013-12-04 DIAGNOSIS — R5383 Other fatigue: Secondary | ICD-10-CM | POA: Diagnosis present

## 2013-12-04 HISTORY — DX: Unspecified osteoarthritis, unspecified site: M19.90

## 2013-12-04 HISTORY — DX: Unspecified chronic bronchitis: J42

## 2013-12-04 HISTORY — DX: Headache: R51

## 2013-12-04 HISTORY — DX: Gastro-esophageal reflux disease without esophagitis: K21.9

## 2013-12-04 HISTORY — DX: Other chronic pain: G89.29

## 2013-12-04 HISTORY — DX: Acute myocardial infarction, unspecified: I21.9

## 2013-12-04 HISTORY — DX: Type 2 diabetes mellitus without complications: E11.9

## 2013-12-04 HISTORY — DX: Dorsalgia, unspecified: M54.9

## 2013-12-04 HISTORY — DX: Cerebral infarction, unspecified: I63.9

## 2013-12-04 LAB — COMPREHENSIVE METABOLIC PANEL
ALBUMIN: 3.9 g/dL (ref 3.5–5.2)
ALK PHOS: 54 U/L (ref 39–117)
ALT: 29 U/L (ref 0–53)
AST: 25 U/L (ref 0–37)
Anion gap: 15 (ref 5–15)
BUN: 11 mg/dL (ref 6–23)
CHLORIDE: 99 meq/L (ref 96–112)
CO2: 24 mEq/L (ref 19–32)
Calcium: 9.1 mg/dL (ref 8.4–10.5)
Creatinine, Ser: 0.7 mg/dL (ref 0.50–1.35)
GFR calc Af Amer: >90 mL/min (ref >90)
GFR calc non Af Amer: >90 mL/min (ref >90)
Glucose, Bld: 317 mg/dL — ABNORMAL HIGH (ref 70–99)
POTASSIUM: 4.1 meq/L (ref 3.7–5.3)
SODIUM: 138 meq/L (ref 137–147)
TOTAL PROTEIN: 6.8 g/dL (ref 6.0–8.3)
Total Bilirubin: 0.2 mg/dL — ABNORMAL LOW (ref 0.3–1.2)

## 2013-12-04 LAB — CBC
HCT: 40.9 % (ref 39.0–52.0)
Hemoglobin: 14.4 g/dL (ref 13.0–17.0)
MCH: 30.7 pg (ref 26.0–34.0)
MCHC: 35.2 g/dL (ref 30.0–36.0)
MCV: 87.2 fL (ref 78.0–100.0)
PLATELETS: 187 10*3/uL (ref 150–400)
RBC: 4.69 MIL/uL (ref 4.22–5.81)
RDW: 13.6 % (ref 11.5–15.5)
WBC: 4.8 10*3/uL (ref 4.0–10.5)

## 2013-12-04 LAB — GLUCOSE, CAPILLARY: Glucose-Capillary: 188 mg/dL — ABNORMAL HIGH (ref 70–99)

## 2013-12-04 LAB — CBG MONITORING, ED: GLUCOSE-CAPILLARY: 300 mg/dL — AB (ref 70–99)

## 2013-12-04 LAB — PROTIME-INR
INR: 0.95 (ref 0.00–1.49)
Prothrombin Time: 12.7 seconds (ref 11.6–15.2)

## 2013-12-04 LAB — TROPONIN I
Troponin I: <0.3 ng/mL (ref <0.30)
Troponin I: <0.3 ng/mL (ref <0.30)

## 2013-12-04 LAB — I-STAT CG4 LACTIC ACID, ED: Lactic Acid, Venous: 2.63 mmol/L — ABNORMAL HIGH (ref 0.5–2.2)

## 2013-12-04 MED ORDER — LORAZEPAM 1 MG PO TABS
1.0000 mg | ORAL_TABLET | Freq: Two times a day (BID) | ORAL | Status: DC | PRN
  Administered 2013-12-04: 1 mg via ORAL
  Filled 2013-12-04: qty 1

## 2013-12-04 MED ORDER — ALBUTEROL SULFATE (2.5 MG/3ML) 0.083% IN NEBU: 2.5000 mg | INHALATION_SOLUTION | Freq: Four times a day (QID) | RESPIRATORY_TRACT | Status: DC | PRN

## 2013-12-04 MED ORDER — SENNOSIDES-DOCUSATE SODIUM 8.6-50 MG PO TABS
1.0000 | ORAL_TABLET | Freq: Every evening | ORAL | Status: DC | PRN
  Filled 2013-12-04: qty 1

## 2013-12-04 MED ORDER — FLUTICASONE PROPIONATE 50 MCG/ACT NA SUSP
2.0000 | Freq: Every day | NASAL | Status: DC
Start: 2013-12-05 — End: 2013-12-05
  Administered 2013-12-05: 2 via NASAL
  Filled 2013-12-04: qty 16

## 2013-12-04 MED ORDER — CLOPIDOGREL BISULFATE 75 MG PO TABS
75.0000 mg | ORAL_TABLET | Freq: Every day | ORAL | Status: DC
  Administered 2013-12-05: 75 mg via ORAL
  Filled 2013-12-04: qty 1

## 2013-12-04 MED ORDER — HYDROCODONE-ACETAMINOPHEN 10-325 MG PO TABS
1.0000 | ORAL_TABLET | Freq: Four times a day (QID) | ORAL | Status: DC | PRN
  Administered 2013-12-04 – 2013-12-05 (×2): 1 via ORAL
  Filled 2013-12-04 (×2): qty 1

## 2013-12-04 MED ORDER — PANTOPRAZOLE SODIUM 40 MG PO TBEC
40.0000 mg | DELAYED_RELEASE_TABLET | Freq: Every day | ORAL | Status: DC
  Administered 2013-12-04 – 2013-12-05 (×2): 40 mg via ORAL
  Filled 2013-12-04 (×2): qty 1

## 2013-12-04 MED ORDER — STROKE: EARLY STAGES OF RECOVERY BOOK
Freq: Once | Status: DC
  Filled 2013-12-04 (×2): qty 1

## 2013-12-04 MED ORDER — ONDANSETRON HCL 4 MG/2ML IJ SOLN
INTRAMUSCULAR | Status: AC
  Administered 2013-12-04: 4 mg via INTRAVENOUS
  Filled 2013-12-04: qty 2

## 2013-12-04 MED ORDER — LISINOPRIL 20 MG PO TABS
20.0000 mg | ORAL_TABLET | Freq: Every day | ORAL | Status: DC
  Administered 2013-12-05: 20 mg via ORAL
  Filled 2013-12-04: qty 1

## 2013-12-04 MED ORDER — ONDANSETRON HCL 4 MG/2ML IJ SOLN
4.0000 mg | Freq: Once | INTRAMUSCULAR | Status: AC
  Administered 2013-12-04: 4 mg via INTRAVENOUS

## 2013-12-04 MED ORDER — ASPIRIN 81 MG PO CHEW
324.0000 mg | CHEWABLE_TABLET | Freq: Once | ORAL | Status: AC
  Administered 2013-12-04: 324 mg via ORAL
  Filled 2013-12-04: qty 4

## 2013-12-04 MED ORDER — ENOXAPARIN SODIUM 40 MG/0.4ML ~~LOC~~ SOLN
40.0000 mg | Freq: Every day | SUBCUTANEOUS | Status: DC
Start: 2013-12-04 — End: 2013-12-05
  Administered 2013-12-04: 40 mg via SUBCUTANEOUS
  Filled 2013-12-04 (×2): qty 0.4

## 2013-12-04 MED ORDER — INSULIN DETEMIR 100 UNIT/ML ~~LOC~~ SOLN
50.0000 [IU] | Freq: Two times a day (BID) | SUBCUTANEOUS | Status: DC
  Administered 2013-12-04 – 2013-12-05 (×2): 50 [IU] via SUBCUTANEOUS
  Filled 2013-12-04 (×3): qty 0.5

## 2013-12-04 MED ORDER — SODIUM CHLORIDE 0.9 % IV BOLUS (SEPSIS)
1000.0000 mL | Freq: Once | INTRAVENOUS | Status: AC
  Administered 2013-12-04: 1000 mL via INTRAVENOUS

## 2013-12-04 MED ORDER — TIOTROPIUM BROMIDE MONOHYDRATE 18 MCG IN CAPS
18.0000 ug | ORAL_CAPSULE | Freq: Every day | RESPIRATORY_TRACT | Status: DC | PRN
  Filled 2013-12-04: qty 5

## 2013-12-04 MED ORDER — DONEPEZIL HCL 10 MG PO TABS
10.0000 mg | ORAL_TABLET | Freq: Every day | ORAL | Status: DC
  Administered 2013-12-04: 10 mg via ORAL
  Filled 2013-12-04 (×2): qty 1

## 2013-12-04 MED ORDER — CITALOPRAM HYDROBROMIDE 40 MG PO TABS
40.0000 mg | ORAL_TABLET | Freq: Every day | ORAL | Status: DC
  Administered 2013-12-05: 40 mg via ORAL
  Filled 2013-12-04: qty 1

## 2013-12-04 MED ORDER — ONDANSETRON HCL 4 MG/2ML IJ SOLN
4.0000 mg | Freq: Four times a day (QID) | INTRAMUSCULAR | Status: DC | PRN
  Administered 2013-12-05: 4 mg via INTRAVENOUS
  Filled 2013-12-04: qty 2

## 2013-12-04 MED ORDER — GABAPENTIN 600 MG PO TABS
600.0000 mg | ORAL_TABLET | Freq: Three times a day (TID) | ORAL | Status: DC
  Administered 2013-12-04 – 2013-12-05 (×2): 600 mg via ORAL
  Filled 2013-12-04 (×4): qty 1

## 2013-12-04 MED ORDER — ONDANSETRON HCL 4 MG/2ML IJ SOLN
4.0000 mg | Freq: Once | INTRAMUSCULAR | Status: AC
  Administered 2013-12-04: 4 mg via INTRAVENOUS
  Filled 2013-12-04: qty 2

## 2013-12-04 NOTE — ED Notes (Addendum)
Patient states his face has some heaviness and numbness on the right, his right arm is numb. Patient drove himself here, states that his vision in the right eye is blurry. He states that he was coming to the ER for fluids because he feels dehydrated and the symptoms started on the way here. Pt had a stroke in November and states that it was his left side that was affected.

## 2013-12-04 NOTE — ED Notes (Signed)
Lactic Acid 2.63 results reported back to Dr. Mingo Amber.

## 2013-12-04 NOTE — ED Notes (Signed)
CN and EMT helpling assist patient from wheelchair to bed.  Patient stated he could not stand due to right leg weakness.  While blood pressure was being placed on left arm, patient could not lift left arm.  When questioned about the side that was weak, patient stated, " Oh yes it is the right."  Patient was observed by this RN to move his right and left arms with no difficulty, however, right hand grip was weaker.  No weakness noted in right and left legs.

## 2013-12-04 NOTE — ED Provider Notes (Signed)
Pt seen and evaluated.  Discussed with Dr. Silvio Clayman.  She also seen by neurology. Complains of right-sided weakness. Inconsistent exam. Neurology recommendation is observation bed in imaging which is pending. He is inconsistent weakness of his right arm on exam. Is able to lift the legs independently from the bed. No obvious shoulder upon exam. Agree with assessment and plan as above.  Tanna Furry, MD 12/04/13 1714

## 2013-12-04 NOTE — Consult Note (Signed)
Referring Physician: ED [facility]    Chief Complaint: right hemiparesis, right face weakness.  HPI:                                                                                                                                         Bryan Becker is an 53 y.o. male with a past medical history significant for HTN, DM, hyperlipidemia, chronic congestive heart failure, OSA, s/p IV thrombolysis 11/14 for possible right brain stroke, pancreatitis, anxiety, COPD, transferred to [facility] as a code stroke due to acute onset of the above stated symptoms. He initially presented to [facility] ED where he had a CT brain that showed no acute intracranial abnormality. Bryan Becker indicated that he has been experiencing nausea and vomited and was driving to the ED when suddenly developed HA followed by heaviness of the right arm and leg as well as right face numbness and blurred vision right eye. He also indicated that he started having chest pain while in the emergency room at [facility] [place]. Denies vertigo, double vision, slurred speech, confusion, or language impairment. CT brain negative for acute abnormality. On daily plavix. Date last known well: 12/04/13  Time last known well: 1430 tPA Given: no, minimal findings and concern of functional exam. NIHSS: 3   Past Medical History  Diagnosis Date  . Diabetes mellitus   . Hypertension   . Pancreatitis   . Kidney stone   . CHF (congestive heart failure)   . Pneumonia   . 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 Neck pain   . Seasonal allergies   . Asthma   . Anxiety   . COPD (chronic obstructive pulmonary disease)   . Hyperlipidemia   . Gastric ulcer   . Fibromyalgia   . OSA (obstructive sleep apnea) 04/24/2013    Past Surgical History  Procedure Laterality Date  . Cholecystectomy    . Multiple cysts removal-hip,wrist    . Upper gastrointestinal endoscopy    . Colonoscopy      Family History  Problem Relation Age of Onset  . Colon cancer Neg Hx    . Esophageal cancer Neg Hx   . Rectal cancer Neg Hx   . Skin cancer Father   . Lung cancer Paternal Grandfather   . Stomach cancer Maternal Grandfather   . Bone cancer Paternal Grandmother   . Diabetes Mother   . Diabetes Maternal Grandmother   . Heart disease Maternal Grandmother   . Heart disease Father   . Heart disease Paternal Grandfather   . Heart attack Paternal Grandmother    Social History:  reports that he quit smoking about 11 years ago. His smoking use included Cigarettes. He smoked 0.00 packs per day. He has never used smokeless tobacco. He reports that he does not drink alcohol or use illicit drugs.  Allergies:  Allergies  Allergen Reactions  . Codeine Other (See  Comments)    Made skin peel    Medications:                                                                                                                           I have reviewed the patient's current medications.  ROS:                                                                                                                                       History obtained from the patient and chart review  General ROS: negative for - chills, fatigue, fever, night sweats,  or weight loss Psychological ROS: negative for - behavioral disorder, hallucinations, memory difficulties, or suicidal ideation Ophthalmic ROS: negative for - double vision, eye pain or loss of vision ENT ROS: negative for - epistaxis, nasal discharge, oral lesions, sore throat, tinnitus or vertigo Allergy and Immunology ROS: negative for - hives or itchy/watery eyes Hematological and Lymphatic ROS: negative for - bleeding problems, bruising or swollen lymph nodes Endocrine ROS: negative for - galactorrhea, hair pattern changes, polydipsia/polyuria or temperature intolerance Respiratory ROS: negative for - cough, hemoptysis, shortness of breath or wheezing Cardiovascular ROS: negative for - dyspnea on exertion, edema or irregular  heartbeat Gastrointestinal ROS: negative for - abdominal pain, diarrhea, hematemesis, nausea/vomiting or stool incontinence Genito-Urinary ROS: negative for - dysuria, hematuria, incontinence or urinary frequency/urgency Musculoskeletal ROS: negative for - joint swelling Neurological ROS: as noted in HPI Dermatological ROS: negative for rash and skin lesion changes  Physical exam: pleasant male in no apparent distress. Blood pressure 141/64, pulse 83, temperature 98 F (36.7 C), temperature source Oral, resp. rate 15, height 5' 7"  (1.702 m), weight 136.079 kg (300 lb), SpO2 97.00%. Head: normocephalic. Neck: supple, no bruits, no JVD. Cardiac: no murmurs. Lungs: clear. Abdomen: soft, no tender, no mass. Extremities: no edema. Neurologic Examination:  General: Mental Status: Alert, oriented, thought content appropriate.  Speech fluent without evidence of aphasia.  Able to follow 3 step commands without difficulty. Cranial Nerves: II: Discs flat bilaterally; Visual fields grossly normal, pupils equal, round, reactive to light and accommodation III,IV, VI: ptosis not present, extra-ocular motions intact bilaterally V,VII: smile symmetric, facial light touch sensation normal bilaterally VIII: hearing normal bilaterally IX,X: gag reflex present XI: bilateral shoulder shrug XII: midline tongue extension without atrophy or fasciculations Motor: Right sided weakness but patient is not giving full effort and overall seems to have a functional pattern of weakness. Tone and bulk:normal tone throughout; no atrophy noted Sensory: Pinprick and light touch diminished right face. Deep Tendon Reflexes:  Right: Upper Extremity   Left: Upper extremity   biceps (C-5 to C-6) 2/4   biceps (C-5 to C-6) 2/4 tricep (C7) 2/4    triceps (C7) 2/4 Brachioradialis (C6) 2/4  Brachioradialis (C6) 2/4  Lower  Extremity Lower Extremity  quadriceps (L-2 to L-4) 2/4   quadriceps (L-2 to L-4) 2/4 Achilles (S1) 2/4   Achilles (S1) 2/4  Plantars: Right: downgoing   Left: downgoing Cerebellar: normal finger-to-nose,  normal heel-to-shin test Gait:  No tested    Results for orders placed during the hospital encounter of 12/04/13 (from the past 48 hour(s))  CBG MONITORING, ED     Status: Abnormal   Collection Time    12/04/13  2:50 PM      Result Value Ref Range   Glucose-Capillary 300 (*) 70 - 99 mg/dL   Comment 1 Notify RN     Comment 2 Documented in Chart    CBC     Status: None   Collection Time    12/04/13  3:22 PM      Result Value Ref Range   WBC 4.8  4.0 - 10.5 K/uL   RBC 4.69  4.22 - 5.81 MIL/uL   Hemoglobin 14.4  13.0 - 17.0 g/dL   HCT 40.9  39.0 - 52.0 %   MCV 87.2  78.0 - 100.0 fL   MCH 30.7  26.0 - 34.0 pg   MCHC 35.2  30.0 - 36.0 g/dL   RDW 13.6  11.5 - 15.5 %   Platelets 187  150 - 400 K/uL  COMPREHENSIVE METABOLIC PANEL     Status: Abnormal   Collection Time    12/04/13  3:22 PM      Result Value Ref Range   Sodium 138  137 - 147 mEq/L   Potassium 4.1  3.7 - 5.3 mEq/L   Chloride 99  96 - 112 mEq/L   CO2 24  19 - 32 mEq/L   Glucose, Bld 317 (*) 70 - 99 mg/dL   BUN 11  6 - 23 mg/dL   Creatinine, Ser 0.70  0.50 - 1.35 mg/dL   Calcium 9.1  8.4 - 10.5 mg/dL   Total Protein 6.8  6.0 - 8.3 g/dL   Albumin 3.9  3.5 - 5.2 g/dL   AST 25  0 - 37 U/L   ALT 29  0 - 53 U/L   Alkaline Phosphatase 54  39 - 117 U/L   Total Bilirubin 0.2 (*) 0.3 - 1.2 mg/dL   GFR calc non Af Amer >90  >90 mL/min   GFR calc Af Amer >90  >90 mL/min   Comment: (NOTE)     The eGFR has been calculated using the CKD EPI equation.     This calculation has not been validated in all  clinical situations.     eGFR's persistently <90 mL/min signify possible Chronic Kidney     Disease.   Anion gap 15  5 - 15  TROPONIN I     Status: None   Collection Time    12/04/13  3:22 PM      Result Value Ref  Range   Troponin I <0.30  <0.30 ng/mL   Comment:            Due to the release kinetics of cTnI,     a negative result within the first hours     of the onset of symptoms does not rule out     myocardial infarction with certainty.     If myocardial infarction is still suspected,     repeat the test at appropriate intervals.  I-STAT CG4 LACTIC ACID, ED     Status: Abnormal   Collection Time    12/04/13  3:34 PM      Result Value Ref Range   Lactic Acid, Venous 2.63 (*) 0.5 - 2.2 mmol/L  PROTIME-INR     Status: None   Collection Time    12/04/13  3:34 PM      Result Value Ref Range   Prothrombin Time 12.7  11.6 - 15.2 seconds   INR 0.95  0.00 - 1.49   Ct Head Wo Contrast  12/04/2013   CLINICAL DATA:  Right facial numbness, right arm numbness blurry vision right eye  EXAM: CT HEAD WITHOUT CONTRAST  TECHNIQUE: Contiguous axial images were obtained from the base of the skull through the vertex without intravenous contrast.  COMPARISON:  08/21/2013  FINDINGS: Calvarium is intact. No significant inflammatory change in the visualized portions of the paranasal sinuses. Very mild age-related cortical atrophy. No hydrocephalus. No evidence of mass or infarct. No hemorrhage or extra-axial fluid.  IMPRESSION: No acute abnormalities   Electronically Signed   By: Skipper Cliche M.D.   On: 12/04/2013 15:40    Assessment: 53 y.o. male with new onset right face numbness and right sided weakness. NIHSS 3, CT brain negative for acute abnormality. Has minor deficits and neuro-exam that appears to have a strong functional component and thus did not treat with IV thrombolysis. Recommend MRI brain and complete stroke work up only if MRI positive for stroke. Continue plavix. PT. Will follow up.  Dorian Pod, MD [facility] [phone]  12/04/2013, 4:38 PM

## 2013-12-04 NOTE — ED Notes (Signed)
Pt now complaining of CP, Dr. Silvio Clayman given new EKG.

## 2013-12-04 NOTE — ED Notes (Signed)
Pt holding cup of water in right hand.

## 2013-12-04 NOTE — Progress Notes (Signed)
Set up new CPAP with nasal mask. Patient wears nasal pillows at home, unsure of his settings. Set on CPAP of 20 cm. Patient tolerating well and will call if any problems arise.

## 2013-12-04 NOTE — ED Provider Notes (Signed)
CSN: [id number]     Arrival date & time 12/04/13  1437 History   First MD Initiated Contact with Patient 12/04/13 1503     Chief Complaint  Patient presents with  . Weakness     (Consider location/radiation/quality/duration/timing/severity/associated sxs/prior Treatment) Patient is a 53 y.o. male presenting with weakness. The history is provided by the patient.  Weakness This is a new problem. The current episode started 1 to 2 hours ago. The problem occurs constantly. The problem has not changed since onset.Associated symptoms include chest pain (chest heaviness), abdominal pain (RLQ since last night) and shortness of breath. Nothing aggravates the symptoms. Nothing relieves the symptoms.    Past Medical History  Diagnosis Date  . Diabetes mellitus   . Hypertension   . Pancreatitis   . Kidney stone   . CHF (congestive heart failure)   . Pneumonia   . 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 Neck pain   . Seasonal allergies   . Asthma   . Anxiety   . COPD (chronic obstructive pulmonary disease)   . Hyperlipidemia   . Gastric ulcer   . Fibromyalgia   . OSA (obstructive sleep apnea) 04/24/2013   Past Surgical History  Procedure Laterality Date  . Cholecystectomy    . Multiple cysts removal-hip,wrist    . Upper gastrointestinal endoscopy    . Colonoscopy     Family History  Problem Relation Age of Onset  . Colon cancer Neg Hx   . Esophageal cancer Neg Hx   . Rectal cancer Neg Hx   . Skin cancer Father   . Lung cancer Paternal Grandfather   . Stomach cancer Maternal Grandfather   . Bone cancer Paternal Grandmother   . Diabetes Mother   . Diabetes Maternal Grandmother   . Heart disease Maternal Grandmother   . Heart disease Father   . Heart disease Paternal Grandfather   . Heart attack Paternal Grandmother    History  Substance Use Topics  . Smoking status: Former Smoker    Types: Cigarettes    Quit date: 05/23/2002  . Smokeless tobacco: Never Used   . Alcohol Use: No    Review of Systems  Constitutional: Negative for fever and chills.  Respiratory: Positive for shortness of breath.   Cardiovascular: Positive for chest pain (chest heaviness).  Gastrointestinal: Positive for vomiting (5-6 times), abdominal pain (RLQ since last night) and diarrhea (3-4 times).  Neurological: Positive for weakness.  All other systems reviewed and are negative.     Allergies  Codeine  Home Medications   Prior to Admission medications   Medication Sig Start Date End Date Taking? Authorizing Provider  albuterol (PROVENTIL HFA;VENTOLIN HFA) 108 (90 BASE) MCG/ACT inhaler Inhale 2 puffs into the lungs every 6 (six) hours as needed. For shortness of breath     Historical Provider, MD  citalopram (CELEXA) 40 MG tablet Take 40 mg by mouth daily.      Historical Provider, MD  clopidogrel (PLAVIX) 75 MG tablet Take 1 tablet (75 mg total) by mouth daily with breakfast. 04/24/13   Abelina Bachelor, PA-C  cyanocobalamin (,VITAMIN B-12,) 1000 MCG/ML injection Inject 1,000 mcg into the muscle once a week.     Historical Provider, MD  donepezil (ARICEPT) 10 MG tablet Take 10 mg by mouth at bedtime.    Historical Provider, MD  fluticasone (FLONASE) 50 MCG/ACT nasal spray Place 2 sprays into the nose daily. 06/17/12   April Alfonso Patten, MD  furosemide (  LASIX) 40 MG tablet Take 40 mg by mouth 2 (two) times daily.      Historical Provider, MD  gabapentin (NEURONTIN) 400 MG capsule Take 600 mg by mouth 3 (three) times daily.     Historical Provider, MD  glimepiride (AMARYL) 4 MG tablet Take 2 mg by mouth daily with breakfast.     Historical Provider, MD  HYDROcodone-acetaminophen (NORCO) 10-325 MG per tablet Take 1 tablet by mouth every 6 (six) hours as needed. 11/19/13   Fransico Meadow, PA-C  insulin detemir (LEVEMIR) 100 UNIT/ML injection Inject 60 Units into the skin 2 (two) times daily.    Historical Provider, MD  insulin lispro protamine-lispro (HUMALOG 50/50 MIX)  (50-50) 100 UNIT/ML SUSP injection Inject into the skin 2 (two) times daily before a meal.    Historical Provider, MD  Liraglutide (VICTOZA) 18 MG/3ML SOLN Inject 1.8 mg into the skin daily.     Historical Provider, MD  lisinopril (PRINIVIL,ZESTRIL) 40 MG tablet Take 40 mg by mouth 2 (two) times daily.     Historical Provider, MD  LORazepam (ATIVAN) 1 MG tablet Take 1 mg by mouth 2 (two) times daily.      Historical Provider, MD  omeprazole (PRILOSEC) 20 MG capsule Take 20 mg by mouth daily.    Historical Provider, MD  ondansetron (ZOFRAN ODT) 4 MG disintegrating tablet Take 1 tablet (4 mg total) by mouth every 8 (eight) hours as needed for nausea. 11/21/12   Allie Bossier, MD  Potassium Gluconate 595 MG CAPS Take 595 mg by mouth daily.     Historical Provider, MD  tiotropium (SPIRIVA) 18 MCG inhalation capsule Place 18 mcg into inhaler and inhale 2 (two) times daily as needed (wheezing).    Historical Provider, MD   BP 175/91  Pulse 94  Temp(Src) 99.6 F (37.6 C) (Oral)  Resp 18  Ht 5' 7"  (1.702 m)  Wt 270 lb (122.471 kg)  BMI 42.28 kg/m2  SpO2 97% Physical Exam  Nursing note and vitals reviewed. Constitutional: He is oriented to person, place, and time. He appears well-developed and well-nourished. No distress.  HENT:  Head: Normocephalic and atraumatic.  Mouth/Throat: Oropharynx is clear and moist. No oropharyngeal exudate.  Eyes: EOM are normal. Pupils are equal, round, and reactive to light.  Neck: Normal range of motion. Neck supple.  Cardiovascular: Normal rate and regular rhythm.  Exam reveals no friction rub.   No murmur heard. Pulmonary/Chest: Effort normal and breath sounds normal. No respiratory distress. He has no wheezes. He has no rales.  Abdominal: He exhibits no distension. There is no tenderness. There is no rebound.  Musculoskeletal: Normal range of motion. He exhibits no edema.  Neurological: He is alert and oriented to person, place, and time. A cranial nerve  deficit (mild altered light sensation on R face) and sensory deficit (altered light touch sensation R arm) is present. He exhibits abnormal muscle tone (R arm weakness, 4/5). GCS eye subscore is 4. GCS verbal subscore is 5. GCS motor subscore is 6.  Skin: No rash noted. He is not diaphoretic.    ED Course  Procedures (including critical care time) Labs Review Labs Reviewed  CBG MONITORING, ED - Abnormal; Notable for the following:    Glucose-Capillary 300 (*)    All other components within normal limits  CBC  COMPREHENSIVE METABOLIC PANEL  TROPONIN I  I-STAT CG4 LACTIC ACID, ED    Imaging Review Ct Head Wo Contrast  12/04/2013   CLINICAL DATA:  Right  facial numbness, right arm numbness blurry vision right eye  EXAM: CT HEAD WITHOUT CONTRAST  TECHNIQUE: Contiguous axial images were obtained from the base of the skull through the vertex without intravenous contrast.  COMPARISON:  08/21/2013  FINDINGS: Calvarium is intact. No significant inflammatory change in the visualized portions of the paranasal sinuses. Very mild age-related cortical atrophy. No hydrocephalus. No evidence of mass or infarct. No hemorrhage or extra-axial fluid.  IMPRESSION: No acute abnormalities   Electronically Signed   By: Skipper Cliche M.D.   On: 12/04/2013 15:40     EKG Interpretation   Date/Time:  Wednesday December 04 2013 14:48:37 EDT Ventricular Rate:  84 PR Interval:  128 QRS Duration: 86 QT Interval:  376 QTC Calculation: 444 R Axis:   51 Text Interpretation:  Normal sinus rhythm Possible Left atrial enlargement  Borderline ECG Similar to prior Confirmed by Mingo Amber  MD, [place] ([id number]) on  12/04/2013 3:51:58 PM      MDM   Final diagnoses:  Right arm weakness    53 year old male with history of stroke presents with right arm weakness. He said he was coming to the ED due to the nausea, vomiting, diarrhea today for fluids and his right arm weakness started. He also felt some right sided facial drawing  up. Here vitals are stable. Patient has mild right arm weakness, 4/5. He has no facial droop, but but mild softening of the right nasolabial fold. I spoke with Dr. Aram Beecham who recommended calling a code stroke. Patient transferred to [facility].    Osvaldo Shipper, MD 12/05/13 [phone]

## 2013-12-04 NOTE — ED Notes (Signed)
Pt states that he is having generalized pain due to his fibromyalgia.

## 2013-12-04 NOTE — ED Provider Notes (Signed)
CSN: [id number]     Arrival date & time 12/04/13  1437 History   First MD Initiated Contact with Patient 12/04/13 1503     Chief Complaint  Patient presents with  . Weakness    (Consider location/radiation/quality/duration/timing/severity/associated sxs/prior Treatment) HPI 53 year old male with a history of diabetes, hypertension, pancreatitis, CHF, pneumonia, cirrhosis presents today with right-sided weakness. Patient states he also has chest pain that started about 30-45 minutes ago.  Past Medical History  Diagnosis Date  . Diabetes mellitus   . Hypertension   . Pancreatitis   . Kidney stone   . CHF (congestive heart failure)   . Pneumonia   . 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 Neck pain   . Seasonal allergies   . Asthma   . Anxiety   . COPD (chronic obstructive pulmonary disease)   . Hyperlipidemia   . Gastric ulcer   . Fibromyalgia   . OSA (obstructive sleep apnea) 04/24/2013   Past Surgical History  Procedure Laterality Date  . Cholecystectomy    . Multiple cysts removal-hip,wrist    . Upper gastrointestinal endoscopy    . Colonoscopy     Family History  Problem Relation Age of Onset  . Colon cancer Neg Hx   . Esophageal cancer Neg Hx   . Rectal cancer Neg Hx   . Skin cancer Father   . Lung cancer Paternal Grandfather   . Stomach cancer Maternal Grandfather   . Bone cancer Paternal Grandmother   . Diabetes Mother   . Diabetes Maternal Grandmother   . Heart disease Maternal Grandmother   . Heart disease Father   . Heart disease Paternal Grandfather   . Heart attack Paternal Grandmother    History  Substance Use Topics  . Smoking status: Former Smoker    Types: Cigarettes    Quit date: 05/23/2002  . Smokeless tobacco: Never Used  . Alcohol Use: No    Review of Systems  Constitutional: Negative for fever and chills.  HENT: Negative for sore throat.   Eyes: Negative for pain.  Respiratory: Negative for cough and shortness of breath.    Cardiovascular: Positive for chest pain.  Gastrointestinal: Negative for nausea, vomiting and abdominal pain.  Genitourinary: Negative for dysuria and flank pain.  Musculoskeletal: Negative for back pain and neck pain.  Skin: Negative for rash.  Neurological: Positive for weakness and numbness. Negative for seizures and headaches.      Allergies  Codeine  Home Medications   Prior to Admission medications   Medication Sig Start Date End Date Taking? Authorizing Provider  albuterol (PROVENTIL HFA;VENTOLIN HFA) 108 (90 BASE) MCG/ACT inhaler Inhale 2 puffs into the lungs every 6 (six) hours as needed. For shortness of breath    Yes Historical Provider, MD  citalopram (CELEXA) 40 MG tablet Take 40 mg by mouth daily.     Yes Historical Provider, MD  clopidogrel (PLAVIX) 75 MG tablet Take 75 mg by mouth daily.   Yes Historical Provider, MD  donepezil (ARICEPT) 10 MG tablet Take 10 mg by mouth at bedtime.   Yes Historical Provider, MD  fluticasone (FLONASE) 50 MCG/ACT nasal spray Place 2 sprays into both nostrils daily.   Yes Historical Provider, MD  furosemide (LASIX) 40 MG tablet Take 40 mg by mouth 2 (two) times daily.     Yes Historical Provider, MD  gabapentin (NEURONTIN) 600 MG tablet Take 1,200 mg by mouth 3 (three) times daily.   Yes Historical Provider, MD  glimepiride (AMARYL) 4 MG tablet Take 2 mg by mouth 2 (two) times daily.    Yes Historical Provider, MD  HYDROcodone-acetaminophen (NORCO) 10-325 MG per tablet Take 1 tablet by mouth every 6 (six) hours.   Yes Historical Provider, MD  insulin detemir (LEVEMIR) 100 UNIT/ML injection Inject 60 Units into the skin at bedtime.    Yes Historical Provider, MD  insulin lispro protamine-lispro (HUMALOG 50/50 MIX) (50-50) 100 UNIT/ML SUSP injection Inject into the skin 2 (two) times daily before a meal.   Yes Historical Provider, MD  Liraglutide (VICTOZA) 18 MG/3ML SOLN Inject 1.8 mg into the skin daily.    Yes Historical Provider, MD   lisinopril (PRINIVIL,ZESTRIL) 40 MG tablet Take 40 mg by mouth daily.    Yes Historical Provider, MD  LORazepam (ATIVAN) 1 MG tablet Take 1 mg by mouth 2 (two) times daily.     Yes Historical Provider, MD  omeprazole (PRILOSEC) 20 MG capsule Take 20 mg by mouth at bedtime.    Yes Historical Provider, MD  ondansetron (ZOFRAN) 4 MG tablet Take 4 mg by mouth every 8 (eight) hours as needed for nausea or vomiting.   Yes Historical Provider, MD  Potassium Gluconate 595 MG CAPS Take 595 mg by mouth daily.    Yes Historical Provider, MD  tiotropium (SPIRIVA) 18 MCG inhalation capsule Place 18 mcg into inhaler and inhale 2 (two) times daily as needed (wheezing).   Yes Historical Provider, MD  cyanocobalamin (,VITAMIN B-12,) 1000 MCG/ML injection Inject 1,000 mcg into the muscle once a week.     Historical Provider, MD   BP 147/63  Pulse 73  Temp(Src) 98.1 F (36.7 C) (Oral)  Resp 15  Ht _0  (1.702 m)  Wt 300 lb (136.079 kg)  BMI 46.98 kg/m2  SpO2 98% Physical Exam  Constitutional: He is oriented to person, place, and time. He appears well-developed and well-nourished. No distress.  HENT:  Head: Normocephalic and atraumatic.  Eyes: Pupils are equal, round, and reactive to light.  Neck: Normal range of motion.  Cardiovascular: Normal rate and regular rhythm.   Pulmonary/Chest: Effort normal and breath sounds normal.  Abdominal: Soft. He exhibits no distension. There is no tenderness.  Musculoskeletal: Normal range of motion.  Neurological: He is alert and oriented to person, place, and time. He has normal strength. No cranial nerve deficit or sensory deficit. He exhibits abnormal muscle tone (4/5 strength in RUE, RLE, though inconsistent). Coordination and gait normal. GCS eye subscore is 4. GCS verbal subscore is 5. GCS motor subscore is 6.  Skin: Skin is warm. He is not diaphoretic.    ED Course  Procedures (including critical care time) Labs Review Labs Reviewed  COMPREHENSIVE  METABOLIC PANEL - Abnormal; Notable for the following:    Glucose, Bld 317 (*)    Total Bilirubin 0.2 (*)    All other components within normal limits  CBG MONITORING, ED - Abnormal; Notable for the following:    Glucose-Capillary 300 (*)    All other components within normal limits  I-STAT CG4 LACTIC ACID, ED - Abnormal; Notable for the following:    Lactic Acid, Venous 2.63 (*)    All other components within normal limits  CBC  TROPONIN I  PROTIME-INR  TROPONIN I  URINE RAPID DRUG SCREEN (HOSP PERFORMED)  URINALYSIS, ROUTINE W REFLEX MICROSCOPIC  TROPONIN I  TROPONIN I    Imaging Review Ct Head Wo Contrast  12/04/2013   CLINICAL DATA:  Right facial numbness, right arm  numbness blurry vision right eye  EXAM: CT HEAD WITHOUT CONTRAST  TECHNIQUE: Contiguous axial images were obtained from the base of the skull through the vertex without intravenous contrast.  COMPARISON:  08/21/2013  FINDINGS: Calvarium is intact. No significant inflammatory change in the visualized portions of the paranasal sinuses. Very mild age-related cortical atrophy. No hydrocephalus. No evidence of mass or infarct. No hemorrhage or extra-axial fluid.  IMPRESSION: No acute abnormalities   Electronically Signed   By: Skipper Cliche M.D.   On: 12/04/2013 15:40     EKG Interpretation   Date/Time:  Wednesday December 04 2013 14:48:37 EDT Ventricular Rate:  84 PR Interval:  128 QRS Duration: 86 QT Interval:  376 QTC Calculation: 444 R Axis:   51 Text Interpretation:  Normal sinus rhythm Possible Left atrial enlargement  Borderline ECG Similar to prior Confirmed by Mingo Amber  MD, [place] ([id number]) on  12/04/2013 3:51:58 PM      MDM   Final diagnoses:  Right arm weakness   53 year old male with a history as above who presents today with weakness. Patient was seen at the other hospital and transferred over. Patient arrived as a code stroke. CODE STROKE for neurologic deficits, including R-sided deficits. Patient met in  the hallway, evaluated by myself and by the stroke team RN/MD. Patient to the CT previously. CT scan with no acute abnormalities.   Neurology feels no acute interventions warranted at this time. Recommend admission for TIA/stroke workup. Will consult hospitalist.   Patient began having episodes of chest pain. EKG was than previously prior to arrival and was reviewed. A repeat EKG was performed here the patient was having chest pain. EKG demonstrates sinus rhythm with no acute findings. No evidence of ischemia on EKG.    Patient seen and evaluated by myself and my attending, Dr. Jeneen Rinks.       Freddi Che, MD 12/05/13 [phone]

## 2013-12-04 NOTE — H&P (Addendum)
[facility] History and Physical  Bryan Becker JTT:[id number] DOB: 1961-05-02 DOA: 12/04/2013  Referring physician: EDP PCP: Harvie Junior, MD   Chief Complaint: R sided weakness  HPI: Bryan Becker is a 53 y.o. male with past history of DM, HTN, Liver cirrhosis/NASH, fibromyalgia, chronic back pain, depression, chronic pancreatitis and chronic nausea, OSA, was admitted with L sided weakness in 11/14, was given tpa then, MRI negative and suspected to have small CVA not visualized on MRI vs Non organic etiology. Today he developed sudden onset R arm and leg weakness with numbness of face and presented to [facility] ER and was transferred to [facility] as a Code Stroke, not given tpa, functional component suspected, during some point in the ER there he also complained of a brief episode of chest pain, since resolved. In ER, CT head, labs, EKG and troponin unremarkable   Review of Systems:  Constitutional:  No weight loss, night sweats, Fevers, chills, fatigue.  HEENT:  No headaches, Difficulty swallowing,Tooth/dental problems,Sore throat,  No sneezing, itching, ear ache, nasal congestion, post nasal drip,  Cardio-vascular:  No chest pain, Orthopnea, PND, swelling in lower extremities, anasarca, dizziness, palpitations  GI:  No heartburn, indigestion, abdominal pain, nausea, vomiting, diarrhea, change in bowel habits, loss of appetite  Resp:  No shortness of breath with exertion or at rest. No excess mucus, no productive cough, No non-productive cough, No coughing up of blood.No change in color of mucus.No wheezing.No chest wall deformity  Skin:  no rash or lesions.  GU:  no dysuria, change in color of urine, no urgency or frequency. No flank pain.  Musculoskeletal:  No joint pain or swelling. No decreased range of motion. No back pain.  Psych:  No change in mood or affect. No depression or anxiety. No memory loss.   Past Medical History  Diagnosis Date  . Diabetes mellitus     . Hypertension   . Pancreatitis   . Kidney stone   . CHF (congestive heart failure)   . Pneumonia   . 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 Neck pain   . Seasonal allergies   . Asthma   . Anxiety   . COPD (chronic obstructive pulmonary disease)   . Hyperlipidemia   . Gastric ulcer   . Fibromyalgia   . OSA (obstructive sleep apnea) 04/24/2013   Past Surgical History  Procedure Laterality Date  . Cholecystectomy    . Multiple cysts removal-hip,wrist    . Upper gastrointestinal endoscopy    . Colonoscopy     Social History:  reports that he quit smoking about 11 years ago. His smoking use included Cigarettes. He smoked 0.00 packs per day. He has never used smokeless tobacco. He reports that he does not drink alcohol or use illicit drugs.  Allergies  Allergen Reactions  . Codeine Other (See Comments)    Made skin peel    Family History  Problem Relation Age of Onset  . Colon cancer Neg Hx   . Esophageal cancer Neg Hx   . Rectal cancer Neg Hx   . Skin cancer Father   . Lung cancer Paternal Grandfather   . Stomach cancer Maternal Grandfather   . Bone cancer Paternal Grandmother   . Diabetes Mother   . Diabetes Maternal Grandmother   . Heart disease Maternal Grandmother   . Heart disease Father   . Heart disease Paternal Grandfather   . Heart attack Paternal Grandmother      Prior to Admission  medications   Medication Sig Start Date End Date Taking? Authorizing Provider  albuterol (PROVENTIL HFA;VENTOLIN HFA) 108 (90 BASE) MCG/ACT inhaler Inhale 2 puffs into the lungs every 6 (six) hours as needed. For shortness of breath     Historical Provider, MD  citalopram (CELEXA) 40 MG tablet Take 40 mg by mouth daily.      Historical Provider, MD  clopidogrel (PLAVIX) 75 MG tablet Take 1 tablet (75 mg total) by mouth daily with breakfast. 04/24/13   Abelina Bachelor, PA-C  cyanocobalamin (,VITAMIN B-12,) 1000 MCG/ML injection Inject 1,000 mcg into the muscle once a  week.     Historical Provider, MD  donepezil (ARICEPT) 10 MG tablet Take 10 mg by mouth at bedtime.    Historical Provider, MD  fluticasone (FLONASE) 50 MCG/ACT nasal spray Place 2 sprays into the nose daily. 06/17/12   April K Palumbo-Rasch, MD  furosemide (LASIX) 40 MG tablet Take 40 mg by mouth 2 (two) times daily.      Historical Provider, MD  gabapentin (NEURONTIN) 400 MG capsule Take 600 mg by mouth 3 (three) times daily.     Historical Provider, MD  glimepiride (AMARYL) 4 MG tablet Take 2 mg by mouth daily with breakfast.     Historical Provider, MD  HYDROcodone-acetaminophen (NORCO) 10-325 MG per tablet Take 1 tablet by mouth every 6 (six) hours as needed. 11/19/13   Fransico Meadow, PA-C  insulin detemir (LEVEMIR) 100 UNIT/ML injection Inject 60 Units into the skin 2 (two) times daily.    Historical Provider, MD  insulin lispro protamine-lispro (HUMALOG 50/50 MIX) (50-50) 100 UNIT/ML SUSP injection Inject into the skin 2 (two) times daily before a meal.    Historical Provider, MD  Liraglutide (VICTOZA) 18 MG/3ML SOLN Inject 1.8 mg into the skin daily.     Historical Provider, MD  lisinopril (PRINIVIL,ZESTRIL) 40 MG tablet Take 40 mg by mouth 2 (two) times daily.     Historical Provider, MD  LORazepam (ATIVAN) 1 MG tablet Take 1 mg by mouth 2 (two) times daily.      Historical Provider, MD  omeprazole (PRILOSEC) 20 MG capsule Take 20 mg by mouth daily.    Historical Provider, MD  ondansetron (ZOFRAN ODT) 4 MG disintegrating tablet Take 1 tablet (4 mg total) by mouth every 8 (eight) hours as needed for nausea. 11/21/12   Allie Bossier, MD  Potassium Gluconate 595 MG CAPS Take 595 mg by mouth daily.     Historical Provider, MD  tiotropium (SPIRIVA) 18 MCG inhalation capsule Place 18 mcg into inhaler and inhale 2 (two) times daily as needed (wheezing).    Historical Provider, MD   Physical Exam: Filed Vitals:   12/04/13 1700  BP:   Pulse:   Temp: 98.1 F (36.7 C)  Resp:     BP 122/58   Pulse 76  Temp(Src) 98.1 F (36.7 C) (Oral)  Resp 17  Ht 5' 7"  (1.702 m)  Wt 136.079 kg (300 lb)  BMI 46.98 kg/m2  SpO2 95%  General:  Appears calm and comfortable Eyes: PERRL, normal lids, irises & conjunctiva ENT: grossly normal hearing, lips & tongue Neck: no LAD, masses or thyromegaly Cardiovascular: RRR, no m/r/g. No LE edema. Telemetry: SR, no arrhythmias  Respiratory: CTA bilaterally, no w/r/r. Normal respiratory effort. Abdomen: soft, obese, slightly, non tender, BS present Skin: no rash or induration seen on limited exam Musculoskeletal: grossly normal tone BUE/BLE Psychiatric: grossly normal mood and affect, speech fluent and appropriate Neurologic: Motor  4/5 RUE and RLE, sensory: diminished light touch in RUE DTR 2plus, plantars mute Cranial nerves intact          Labs on Admission:  Basic Metabolic Panel:  Recent Labs Lab 12/04/13 1522  NA 138  K 4.1  CL 99  CO2 24  GLUCOSE 317*  BUN 11  CREATININE 0.70  CALCIUM 9.1   Liver Function Tests:  Recent Labs Lab 12/04/13 1522  AST 25  ALT 29  ALKPHOS 54  BILITOT 0.2*  PROT 6.8  ALBUMIN 3.9   No results found for this basename: LIPASE, AMYLASE,  in the last 168 hours No results found for this basename: AMMONIA,  in the last 168 hours CBC:  Recent Labs Lab 12/04/13 1522  WBC 4.8  HGB 14.4  HCT 40.9  MCV 87.2  PLT 187   Cardiac Enzymes:  Recent Labs Lab 12/04/13 1522  TROPONINI <0.30    BNP (last 3 results)  Recent Labs  04/18/13 1643 11/19/13 1733  PROBNP 31.0 16.3   CBG:  Recent Labs Lab 12/04/13 1450  GLUCAP 300*    Radiological Exams on Admission: Ct Head Wo Contrast  12/04/2013   CLINICAL DATA:  Right facial numbness, right arm numbness blurry vision right eye  EXAM: CT HEAD WITHOUT CONTRAST  TECHNIQUE: Contiguous axial images were obtained from the base of the skull through the vertex without intravenous contrast.  COMPARISON:  08/21/2013  FINDINGS: Calvarium is  intact. No significant inflammatory change in the visualized portions of the paranasal sinuses. Very mild age-related cortical atrophy. No hydrocephalus. No evidence of mass or infarct. No hemorrhage or extra-axial fluid.  IMPRESSION: No acute abnormalities   Electronically Signed   By: Skipper Cliche M.D.   On: 12/04/2013 15:40    EKG: Independently reviewed. NSR, no acute ST T wave changes  Assessment/Plan Principal Problem:   Right hemiparesis -suspect functional component, agree with Neuro -MRI brain and rest of CVA workup only if MRI abnormal -NEuro following -Continue Plavix    Chest pain -transient resolved -EKG and troponin unremarkable -cycle troponins    Chronic abd pain and nausea -for over 1 year -followed by [facility] -no further inpatient workup necessary at this time    Diabetes mellitus -Resume Insulin, SSI    COPD with asthma -stable, nebs PRN    Fibromyalgia/chronic  -continue hydrocodone PRN    OSA (obstructive sleep apnea)  DVt proph: lovenox  Code Status: DNR Family Communication: none at bedside Disposition Plan: home pending workup  Time spent: 59mn  Bryan Becker [facility] Pager [phone] **Disclaimer: This note may have been dictated with voice recognition software. Similar sounding words can inadvertently be transcribed and this note may contain transcription errors which may not have been corrected upon publication of note.**

## 2013-12-04 NOTE — ED Notes (Signed)
Pt is transfer from [facility], with R sided facial numbness, right sided weakness. Pt hx of left sided stroke in November. Pt CBG 300. LSN 1430. AAOx4, NSR. 148/81, 95% on room air. NIH from [facility] is 7. Langley Gauss, RN and Dr. Armida Sans at bedside.

## 2013-12-05 ENCOUNTER — Inpatient Hospital Stay (HOSPITAL_COMMUNITY): Payer: Medicaid Other

## 2013-12-05 ENCOUNTER — Encounter (HOSPITAL_COMMUNITY): Payer: Self-pay | Admitting: General Practice

## 2013-12-05 DIAGNOSIS — I10 Essential (primary) hypertension: Secondary | ICD-10-CM

## 2013-12-05 DIAGNOSIS — IMO0001 Reserved for inherently not codable concepts without codable children: Secondary | ICD-10-CM

## 2013-12-05 LAB — URINALYSIS, ROUTINE W REFLEX MICROSCOPIC
BILIRUBIN URINE: NEGATIVE
Glucose, UA: 500 mg/dL — AB
Hgb urine dipstick: NEGATIVE
Ketones, ur: NEGATIVE mg/dL
LEUKOCYTES UA: NEGATIVE
NITRITE: NEGATIVE
Protein, ur: NEGATIVE mg/dL
SPECIFIC GRAVITY, URINE: 1.014 (ref 1.005–1.030)
UROBILINOGEN UA: 0.2 mg/dL (ref 0.0–1.0)
pH: 5.5 (ref 5.0–8.0)

## 2013-12-05 LAB — GLUCOSE, CAPILLARY
GLUCOSE-CAPILLARY: 180 mg/dL — AB (ref 70–99)
Glucose-Capillary: 191 mg/dL — ABNORMAL HIGH (ref 70–99)

## 2013-12-05 LAB — LIPID PANEL
CHOL/HDL RATIO: 8 ratio
CHOLESTEROL: 168 mg/dL (ref 0–200)
HDL: 21 mg/dL — AB (ref >39)
LDL Cholesterol: UNDETERMINED mg/dL (ref 0–99)
Triglycerides: 597 mg/dL — ABNORMAL HIGH (ref <150)
VLDL: UNDETERMINED mg/dL (ref 0–40)

## 2013-12-05 LAB — TROPONIN I
Troponin I: <0.3 ng/mL (ref <0.30)
Troponin I: <0.3 ng/mL (ref <0.30)

## 2013-12-05 LAB — HEMOGLOBIN A1C
Hgb A1c MFr Bld: 9.1 % — ABNORMAL HIGH (ref <5.7)
MEAN PLASMA GLUCOSE: 214 mg/dL — AB (ref <117)

## 2013-12-05 MED ORDER — INSULIN ASPART 100 UNIT/ML ~~LOC~~ SOLN: 0.0000 [IU] | Freq: Every day | SUBCUTANEOUS | Status: DC

## 2013-12-05 MED ORDER — FUROSEMIDE 40 MG PO TABS: 40.0000 mg | ORAL_TABLET | Freq: Every day | ORAL | Status: DC

## 2013-12-05 MED ORDER — TIOTROPIUM BROMIDE MONOHYDRATE 18 MCG IN CAPS: 18.0000 ug | ORAL_CAPSULE | Freq: Every day | RESPIRATORY_TRACT | Status: DC | PRN

## 2013-12-05 MED ORDER — ONDANSETRON HCL 4 MG PO TABS: 4.0000 mg | ORAL_TABLET | Freq: Three times a day (TID) | ORAL | Status: DC | PRN

## 2013-12-05 MED ORDER — PROMETHAZINE HCL 12.5 MG PO TABS
12.5000 mg | ORAL_TABLET | Freq: Four times a day (QID) | ORAL | Status: DC | PRN
  Administered 2013-12-05: 12.5 mg via ORAL
  Filled 2013-12-05: qty 1

## 2013-12-05 MED ORDER — GABAPENTIN 600 MG PO TABS
1200.0000 mg | ORAL_TABLET | Freq: Three times a day (TID) | ORAL | Status: DC
  Filled 2013-12-05 (×2): qty 2

## 2013-12-05 MED ORDER — INSULIN ASPART 100 UNIT/ML ~~LOC~~ SOLN
0.0000 [IU] | Freq: Three times a day (TID) | SUBCUTANEOUS | Status: DC
  Administered 2013-12-05: 3 [IU] via SUBCUTANEOUS

## 2013-12-05 NOTE — Evaluation (Signed)
Physical Therapy Evaluation Patient Details Name: Bryan Becker MRN: [id number] DOB: Apr 22, 1961 Today's Date: 12/05/2013   History of Present Illness  Patient is a 53 y/o male who was on his way to the ED on 7/15 secondary to N/V, diarrhea but in route to ED pt noticed sudden onset of RUE weakness, right facial weakness and blurry vision on right. PMH of HTN, DM, HLD, CHF, s/p IV thrombolysis 11/14 for possible right CVA, COPD, fibromyalgia and depression. Head CT (-). Reported chest pain in ED. NIHSS:3 however all symptoms resolved on morning of PT evaluation.   Clinical Impression  Patient presents with functional limitations as listed in PT problem list below. Pt with balance impairments at baseline putting pt at increased risk for falls. Educated pt on importance of using RW at home due to fall history. Impaired muscular endurance noted with increased distances with instances of almost knee buckling due to fatigue. Pt would benefit from skilled PT to maximize independence and decrease risk of falls so pt can safely return to PLOF.     Follow Up Recommendations Home health PT;Supervision for mobility/OOB    Equipment Recommendations  None recommended by PT    Recommendations for Other Services       Precautions / Restrictions Precautions Precautions: Fall Restrictions Weight Bearing Restrictions: No      Mobility  Bed Mobility               General bed mobility comments: Sitting EOB upon arrival.  Transfers Overall transfer level: Needs assistance Equipment used: Straight cane ("Walking stick") Transfers: Sit to/from Stand Sit to Stand: Min guard         General transfer comment: Increased time. Stood x3 from EOB.  Ambulation/Gait Ambulation/Gait assistance: Min guard Ambulation Distance (Feet): 150 Feet Assistive device: Straight cane ("walking stick") Gait Pattern/deviations: Step-to pattern;Decreased stride length;Staggering left;Staggering right Gait  velocity: 1.1 ft/sec Gait velocity interpretation: Below normal speed for age/gender General Gait Details: Staggering R/L occasionally and reaching to hold onto wall and furniture for more support. Pt reluctant to use RW, "I just don't like it but I will use it if I have too." Unsteady.   Stairs Stairs: Yes Stairs assistance: Min guard Stair Management: One rail Right;Step to pattern;With cane Number of Stairs: 2 General stair comments: Increased time. Guarded.  Wheelchair Mobility    Modified Rankin (Stroke Patients Only)       Balance Overall balance assessment: Needs assistance Sitting-balance support: Feet supported;No upper extremity supported Sitting balance-Leahy Scale: Good     Standing balance support: Single extremity supported;During functional activity Standing balance-Leahy Scale: Poor Standing balance comment: Unsteady upon standing especially with increased distance requiring support for balance.                 Standardized Balance Assessment Standardized Balance Assessment : TUG: Timed Up and Go Test     Timed Up and Go Test TUG: Normal TUG Normal TUG (seconds): 45.5     Pertinent Vitals/Pain Reports chronic pain from fibromyalgia, not rated for this therapist. Pt repositioned for comfort. SOB present post gait training however vitals stable.    Home Living Family/patient expects to be discharged to:: Private residence Living Arrangements: Spouse/significant other Available Help at Discharge: Family;Available 24 hours/day Type of Home: House Home Access: Stairs to enter Entrance Stairs-Rails: None Entrance Stairs-Number of Steps: 2 (Getting hand rails built.) Home Layout: One level Home Equipment: Walker - 2 wheels;Cane - single point;Shower seat Additional Comments: Raised toilet seat.  Prior Function Level of Independence: Independent with assistive device(s)   Gait / Transfers Assistance Needed: Uses walking stick for household and  community ambulation amd uses RW PRN.   ADL's / Homemaking Assistance Needed: Has supervision for all showers. Wife performs IADLs.  Comments: Pt only drives with someone in the car due to memory difficulties.     Hand Dominance   Dominant Hand: Right    Extremity/Trunk Assessment   Upper Extremity Assessment: Overall WFL for tasks assessed           Lower Extremity Assessment: Generalized weakness         Communication   Communication: No difficulties  Cognition Arousal/Alertness: Awake/alert Behavior During Therapy: WFL for tasks assessed/performed Overall Cognitive Status: History of cognitive impairments - at baseline                      General Comments General comments (skin integrity, edema, etc.): Mild edema present dorsum of B feet.    Exercises        Assessment/Plan    PT Assessment Patient needs continued PT services  PT Diagnosis Difficulty walking;Generalized weakness   PT Problem List Decreased strength;Pain;Decreased cognition;Decreased activity tolerance;Decreased knowledge of use of DME;Decreased balance;Decreased safety awareness;Obesity;Decreased mobility;Decreased knowledge of precautions  PT Treatment Interventions DME instruction;Balance training;Gait training;Neuromuscular re-education;Stair training;Cognitive remediation;Functional mobility training;Patient/family education;Therapeutic activities;Therapeutic exercise   PT Goals (Current goals can be found in the Care Plan section) Acute Rehab PT Goals Patient Stated Goal: to get better PT Goal Formulation: With patient Time For Goal Achievement: 12/19/13 Potential to Achieve Goals: Good    Frequency Min 3X/week   Barriers to discharge        Co-evaluation               End of Session Equipment Utilized During Treatment: Gait belt Activity Tolerance: Patient tolerated treatment well Patient left: in bed;with call bell/phone within reach Nurse Communication:  Mobility status;Precautions         Time: 1540-0867 PT Time Calculation (min): 24 min   Charges:   PT Evaluation $Initial PT Evaluation Tier I: 1 Procedure PT Treatments $Gait Training: 8-22 mins   PT G CodesCandy Sledge A 12/05/2013, 10:27 AM Candy Sledge, [place], DPT [phone]

## 2013-12-05 NOTE — Evaluation (Signed)
Speech Language Pathology Evaluation Patient Details Name: Bryan Becker MRN: [id number] DOB: 06-01-1960 Today's Date: 12/05/2013 Time: 7829-5621 SLP Time Calculation (min): 32 min  Problem List:  Patient Active Problem List   Diagnosis Date Noted  . Weakness 12/04/2013  . Right hemiparesis 12/04/2013  . Morbid obesity 04/24/2013  . COPD with asthma 04/24/2013  . Fibromyalgia 04/24/2013  . Dyslipidemia, goal LDL below 70 04/24/2013  . Headache(784.0) 04/24/2013  . OSA (obstructive sleep apnea) 04/24/2013  . Encounter for long-term (current) use of high-risk medication 04/24/2013  . S/P administration of tPA (rtPA) in a different facility within the last 24 hours prior to admission to current facility 04/24/2013  . Stroke 04/20/2013  . RMS[facility] spotted fever) 11/22/2012  . Nausea & vomiting 11/20/2012  . Fever 11/20/2012  . Diabetes mellitus 11/20/2012  . NASH (nonalcoholic steatohepatitis) 11/20/2012  . HTN (hypertension) 11/20/2012  . Abdominal pain, unspecified site 11/20/2012   Past Medical History:  Past Medical History  Diagnosis Date  . Hypertension   . Pancreatitis   . CHF (congestive heart failure)   . 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 Seasonal allergies   . Asthma   . Anxiety   . COPD (chronic obstructive pulmonary disease)   . Hyperlipidemia   . Gastric ulcer   . Fibromyalgia   . Kidney stone   . Myocardial infarction 1995  . Type II diabetes mellitus   . Pneumonia     "several times"  . Chronic bronchitis     "get it q yr"  . OSA (obstructive sleep apnea) 04/24/2013    "use BiPAP"  . GERD (gastroesophageal reflux disease)   . HYQMVHQI(696.2)     "weekly" (12/04/2013)  . Arthritis     "joints"  . Chronic back pain     "mostly lower; have 2 bulging discs in my neck" (12/05/2013)  . Stroke 03/2013    "still weak on the left side" (12/05/2013)  . Stroke 12/04/2013    "came in today w/right sided weakness" (12/04/2013)    Past Surgical History:  Past Surgical History  Procedure Laterality Date  . Cholecystectomy    . Multiple cysts removal-hip,wrist    . Upper gastrointestinal endoscopy    . Colonoscopy    . Tonsillectomy  ~ 1985  . Cardiac catheterization    . Cystoscopy w/ stone manipulation     HPI:  Bryan Becker is a 53 y.o. male- PMH of DM, HTN, liver cirrhosis, fibromyalgia, chronic back pain, depression, chronic pancreatitis and chronic nausea, OSA, was admitted with L sided weakness in 03/2013, was given tpa then, MRI negative and suspected to have small CVA not visualized on MRI vs Non organic etiology. On 7/15  developed sudden R arm/ leg weakness/ numbness in face. Head CT no acute abnormalities, awaiting MRI. SLP eval ordered as part of stroke protocol.   Assessment / Plan / Recommendation Clinical Impression  Pt's overall motor speech and language skills are intact for tasks assessed. He is currently demonstrating some mild difficulties with ST memory tasks but reports that he is at baseline- with early onset dementia. Problem solving and safety awareness intact for tasks assessed. Provided review/ education on memory strategies such as associations, creating photo albums/ videos, making lists, etc. He frequently stated "I no longer ____;" encouraged pt to do these everyday tasks with assistance rather than avoiding them in order to maintain independence as much as possible. Given that there are no acute needs and he  is reportedly at baseline level of function, ST will sign off at this time. Please re-consult if needs arise.    SLP Assessment  Patient does not need any further Speech Lanaguage Pathology Services    Follow Up Recommendations  None    Frequency and Duration        Pertinent Vitals/Pain n/a   SLP Goals     SLP Evaluation Prior Functioning  Cognitive/Linguistic Baseline: Baseline deficits Baseline deficit details: Pt reports having early onset dementia- had increased  memory difficulties after stroke in 2014 and received home health ST. Type of Home: House  Lives With: Spouse Available Help at Discharge: Family Vocation: Retired   Associate Professor  Overall Cognitive Status: History of cognitive impairments - at baseline Arousal/Alertness: Awake/alert Orientation Level: Oriented X4 Attention: Selective Selective Attention: Appears intact Memory: Impaired Memory Impairment: Decreased recall of new information;Other (comment) (reports difficulties w/ long term memory- events from trips) Awareness: Appears intact Problem Solving: Appears intact Executive Function: Sequencing;Organizing Sequencing: Appears intact Organizing: Appears intact Safety/Judgment: Appears intact    Comprehension  Auditory Comprehension Overall Auditory Comprehension: Appears within functional limits for tasks assessed Yes/No Questions: Within Functional Limits Commands: Within Functional Limits Conversation: Complex Other Conversation Comments: talkative    Expression Expression Primary Mode of Expression: Verbal Verbal Expression Overall Verbal Expression: Appears within functional limits for tasks assessed Initiation: No impairment Level of Generative/Spontaneous Verbalization: Conversation Repetition: No impairment Naming: No impairment Pragmatics: No impairment Non-Verbal Means of Communication: Not applicable Written Expression Dominant Hand: Right   Oral / Motor Oral Motor/Sensory Function Overall Oral Motor/Sensory Function: Appears within functional limits for tasks assessed Motor Speech Overall Motor Speech: Appears within functional limits for tasks assessed   GO     Gilmore Laroche, Andrez Grime, MA, CCC-SLP 12/05/2013, 9:53 AM

## 2013-12-05 NOTE — Progress Notes (Signed)
UR Completed [place] Ariel Graves-Bigelow, RN,BSN [phone]  

## 2013-12-05 NOTE — Care Management Note (Signed)
    Page 1 of 1   12/05/2013     3:38:16 PM CARE MANAGEMENT NOTE 12/05/2013  Patient:  Bryan Becker, Bryan Becker   Account Number:  [id number]  Date Initiated:  12/05/2013  Documentation initiated by:  GRAVES-BIGELOW,Anahis Furgeson  Subjective/Objective Assessment:   Pt admitted for R sided weakness.     Action/Plan:   Plan for d/c home today. Pt agreeable to [facility] services with [facility] for PT. SOC to begin within 24-48 hours post d/c.   Anticipated DC Date:  12/05/2013   Anticipated DC Plan:  [place]  CM consult      [facility] Choice  HOME HEALTH   Choice offered to / List presented to:  C-1 Patient        [place] arranged  HH-2 PT      [place]   Status of service:  Completed, signed off Medicare Important Message given?  NO (If response is "NO", the following Medicare IM given date fields will be blank) Date Medicare IM given:   Medicare IM given by:   Date Additional Medicare IM given:   Additional Medicare IM given by:    Discharge Disposition:  [place]  Per UR Regulation:  Reviewed for med. necessity/level of care/duration of stay  If discussed at [place] of Stay Meetings, dates discussed:    Comments:

## 2013-12-05 NOTE — Progress Notes (Signed)
PROGRESS NOTE  Bryan Becker YQM:[id number] DOB: [birth year] DOA: 12/04/2013 PCP: Harvie Junior, MD  Assessment/Plan: Right hemiparesis  -resolved -suspect functional component, agree with Neuro  -MRI brain and rest of CVA workup only if MRI abnormal  -NEuro following  -Continue Plavix   Chest pain  -transient resolved  -EKG and troponin unremarkable  -cycle troponins- negative  Chronic abd pain and nausea  -for over 1 year  -followed by [facility]  -no further inpatient workup necessary at this time further than abd x ray  Diabetes mellitus  -Resume Insulin, SSI   COPD with asthma  -stable, nebs PRN   Fibromyalgia/chronic  -continue hydrocodone PRN   OSA (obstructive sleep apnea)  Patient reported CHF and says he must get IVF every couple of weeks for dehydration, no echo here reflects CHF- will decrease lasix at d/c and patient will need to follow with PCP, does not weigh himself daily   Code Status: full Family Communication: patient Disposition Plan: await MRI for d/c   Consultants:  neuro  Procedures:    Antibiotics:    HPI/Subjective: Patient with multiple medical complaints, all of which appear to be being addressed as outpatient   Objective: Filed Vitals:   12/05/13 0800  BP: 143/73  Pulse: 87  Temp:   Resp: 16    Intake/Output Summary (Last 24 hours) at 12/05/13 1206 Last data filed at 12/05/13 0500  Gross per 24 hour  Intake      0 ml  Output    800 ml  Net   -800 ml   Filed Weights   12/04/13 1453 12/04/13 1630 12/04/13 1951  Weight: 122.471 kg (270 lb) 136.079 kg (300 lb) 131.3 kg (289 lb 7.4 oz)    Exam:  General: Appears calm and comfortable  Cardiovascular: RRR, no m/r/g. No LE edema.  Respiratory: CTA bilaterally, no w/r/r. Normal respiratory effort.  Abdomen: soft, obese, slightly, non tender, BS present   Musculoskeletal: grossly normal tone BUE/BLE    Data Reviewed: Basic Metabolic Panel:  Recent Labs Lab  12/04/13 1522  NA 138  K 4.1  CL 99  CO2 24  GLUCOSE 317*  BUN 11  CREATININE 0.70  CALCIUM 9.1   Liver Function Tests:  Recent Labs Lab 12/04/13 1522  AST 25  ALT 29  ALKPHOS 54  BILITOT 0.2*  PROT 6.8  ALBUMIN 3.9   No results found for this basename: LIPASE, AMYLASE,  in the last 168 hours No results found for this basename: AMMONIA,  in the last 168 hours CBC:  Recent Labs Lab 12/04/13 1522  WBC 4.8  HGB 14.4  HCT 40.9  MCV 87.2  PLT 187   Cardiac Enzymes:  Recent Labs Lab 12/04/13 1522 12/04/13 1806 12/04/13 2345 12/05/13 0640  TROPONINI <0.30 <0.30 <0.30 <0.30   BNP (last 3 results)  Recent Labs  04/18/13 1643 11/19/13 1733  PROBNP 31.0 16.3   CBG:  Recent Labs Lab 12/04/13 1450 12/04/13 2003 12/05/13 0745  GLUCAP 300* 188* 191*    No results found for this or any previous visit (from the past 240 hour(s)).   Studies: Ct Head Wo Contrast  12/04/2013   CLINICAL DATA:  Right facial numbness, right arm numbness blurry vision right eye  EXAM: CT HEAD WITHOUT CONTRAST  TECHNIQUE: Contiguous axial images were obtained from the base of the skull through the vertex without intravenous contrast.  COMPARISON:  08/21/2013  FINDINGS: Calvarium is intact. No significant inflammatory change in the visualized portions of the  paranasal sinuses. Very mild age-related cortical atrophy. No hydrocephalus. No evidence of mass or infarct. No hemorrhage or extra-axial fluid.  IMPRESSION: No acute abnormalities   Electronically Signed   By: Skipper Cliche M.D.   On: 12/04/2013 15:40    Scheduled Meds: .  stroke: mapping our early stages of recovery book   Does not apply Once  . citalopram  40 mg Oral Daily  . clopidogrel  75 mg Oral Q breakfast  . donepezil  10 mg Oral QHS  . enoxaparin (LOVENOX) injection  40 mg Subcutaneous Q2000  . fluticasone  2 spray Each Nare Daily  . gabapentin  1,200 mg Oral TID  . insulin aspart  0-15 Units Subcutaneous TID WC    . insulin aspart  0-5 Units Subcutaneous QHS  . insulin detemir  50 Units Subcutaneous BID  . lisinopril  20 mg Oral Daily  . pantoprazole  40 mg Oral Daily   Continuous Infusions:  Antibiotics Given (last 72 hours)   None      Principal Problem:   Right hemiparesis Active Problems:   Nausea & vomiting   Diabetes mellitus   NASH (nonalcoholic steatohepatitis)   Morbid obesity   COPD with asthma   Fibromyalgia   OSA (obstructive sleep apnea)   Weakness    Time spent: 25 min    Bryan Becker  [facility] Pager [phone]. If 7PM-7AM, please contact night-coverage at [email or web address], password [facility] 12/05/2013, 12:06 PM  LOS: 1 day

## 2013-12-05 NOTE — Discharge Summary (Signed)
Physician Discharge Summary  Eileen Croswell HER:[id number] DOB: [birth year] DOA: 12/04/2013  PCP: Harvie Junior, MD  Admit date: 12/04/2013 Discharge date: 12/06/2013  Time spent: 35 minutes  Recommendations for Outpatient Follow-up:  1. Outpatient psych eval  Discharge Diagnoses:  Principal Problem:   Right hemiparesis Active Problems:   Nausea & vomiting   Diabetes mellitus   NASH (nonalcoholic steatohepatitis)   Morbid obesity   COPD with asthma   Fibromyalgia   OSA (obstructive sleep apnea)   Weakness   Discharge Condition: improved  Diet recommendation: cardiac/diabetic  Filed Weights   12/04/13 1453 12/04/13 1630 12/04/13 1951  Weight: 122.471 kg (270 lb) 136.079 kg (300 lb) 131.3 kg (289 lb 7.4 oz)    History of present illness:  Bryan Becker is a 53 y.o. male with past history of DM, HTN, Liver cirrhosis/NASH, fibromyalgia, chronic back pain, depression, chronic pancreatitis and chronic nausea, OSA, was admitted with L sided weakness in 11/14, was given tpa then, MRI negative and suspected to have small CVA not visualized on MRI vs Non organic etiology.  Today he developed sudden onset R arm and leg weakness with numbness of face and presented to [facility] ER and was transferred to [facility] as a Code Stroke, not given tpa, functional component suspected, during some point in the ER there he also complained of a brief episode of chest pain, since resolved.  In ER, CT head, labs, EKG and troponin unremarkable   Hospital Course:  Right hemiparesis  -resolved  -suspect functional component, agree with Neuro  -MRI brain negative -NEuro eval -Continue Plavix   Chest pain  -transient resolved  -EKG and troponin unremarkable  -cycle troponins- negative   Chronic abd pain and nausea  -for over 1 year  -followed by [facility]  -no further inpatient workup necessary at this time further than abd x ray that was normal  Diabetes mellitus  -Resume Insulin, SSI    COPD with asthma  -stable, nebs PRN   Fibromyalgia/chronic  -continue hydrocodone PRN   OSA (obstructive sleep apnea)   Patient reported CHF and says he must get IVF every couple of weeks for dehydration, no echo here reflects CHF- will decrease lasix at d/c and patient will need to follow with PCP, does not weigh himself daily   Procedures:    Consultations:  neuro  Discharge Exam: Filed Vitals:   12/05/13 1327  BP: 153/92  Pulse: 77  Temp: 97.9 F (36.6 C)  Resp: 16    General: A+Ox3, NAD Cardiovascular: rrr Respiratory: clear  Discharge Instructions You were cared for by a hospitalist during your hospital stay. If you have any questions about your discharge medications or the care you received while you were in the hospital after you are discharged, you can call the unit and asked to speak with the hospitalist on call if the hospitalist that took care of you is not available. Once you are discharged, your primary care physician will handle any further medical issues. Please note that NO REFILLS for any discharge medications will be authorized once you are discharged, as it is imperative that you return to your primary care physician (or establish a relationship with a primary care physician if you do not have one) for your aftercare needs so that they can reassess your need for medications and monitor your lab values.      Discharge Instructions   Diet - low sodium heart healthy    Complete by:  As directed  Diet Carb Modified    Complete by:  As directed      Discharge instructions    Complete by:  As directed   Cbc, bmp 1 week     Increase activity slowly    Complete by:  As directed             Medication List         albuterol 108 (90 BASE) MCG/ACT inhaler  Commonly known as:  PROVENTIL HFA;VENTOLIN HFA  Inhale 2 puffs into the lungs every 6 (six) hours as needed. For shortness of breath     citalopram 40 MG tablet  Commonly known as:  CELEXA   Take 40 mg by mouth daily.     clopidogrel 75 MG tablet  Commonly known as:  PLAVIX  Take 75 mg by mouth daily.     cyanocobalamin 1000 MCG/ML injection  Commonly known as:  (VITAMIN B-12)  Inject 1,000 mcg into the muscle once a week.     donepezil 10 MG tablet  Commonly known as:  ARICEPT  Take 10 mg by mouth at bedtime.     fluticasone 50 MCG/ACT nasal spray  Commonly known as:  FLONASE  Place 2 sprays into both nostrils daily.     furosemide 40 MG tablet  Commonly known as:  LASIX  Take 1 tablet (40 mg total) by mouth daily.     gabapentin 600 MG tablet  Commonly known as:  NEURONTIN  Take 1,200 mg by mouth 3 (three) times daily.     glimepiride 4 MG tablet  Commonly known as:  AMARYL  Take 2 mg by mouth 2 (two) times daily.     HYDROcodone-acetaminophen 10-325 MG per tablet  Commonly known as:  NORCO  Take 1 tablet by mouth every 6 (six) hours.     insulin detemir 100 UNIT/ML injection  Commonly known as:  LEVEMIR  Inject 60 Units into the skin at bedtime.     insulin lispro protamine-lispro (50-50) 100 UNIT/ML Susp injection  Commonly known as:  HUMALOG 50/50 MIX  Inject into the skin 2 (two) times daily before a meal.     lisinopril 40 MG tablet  Commonly known as:  PRINIVIL,ZESTRIL  Take 40 mg by mouth daily.     LORazepam 1 MG tablet  Commonly known as:  ATIVAN  Take 1 mg by mouth 2 (two) times daily.     omeprazole 20 MG capsule  Commonly known as:  PRILOSEC  Take 20 mg by mouth at bedtime.     ondansetron 4 MG tablet  Commonly known as:  ZOFRAN  Take 1 tablet (4 mg total) by mouth every 8 (eight) hours as needed for nausea or vomiting.     Potassium Gluconate 595 MG Caps  Take 595 mg by mouth daily.     tiotropium 18 MCG inhalation capsule  Commonly known as:  SPIRIVA  Place 1 capsule (18 mcg total) into inhaler and inhale daily as needed (wheezing).     VICTOZA 18 MG/3ML Soln injection  Generic drug:  Liraglutide  Inject 1.8 mg into the  skin daily.       Allergies  Allergen Reactions  . Codeine Other (See Comments)    Made skin peel   Follow-up Information   Follow up with Harvie Junior, MD. (keep appointment as scheduled)    Specialty:  Specialist   Contact information:   [place] [address] [phone]       Follow  up with [facility]. (Physical Therapy)    Specialty:  Home Health Services   Contact information:   [place] [address] [phone]        The results of significant diagnostics from this hospitalization (including imaging, microbiology, ancillary and laboratory) are listed below for reference.    Significant Diagnostic Studies: Dg Chest 2 View  11/19/2013   CLINICAL DATA:  Right-sided chest pain.  Vomiting.  EXAM: CHEST  2 VIEW  COMPARISON:  10/24/2013  FINDINGS: The heart size and mediastinal contours are within normal limits. Both lungs are clear. The visualized skeletal structures are unremarkable.  IMPRESSION: No active cardiopulmonary disease.   Electronically Signed   By: Earle Gell M.D.   On: 11/19/2013 18:10   Ct Head Wo Contrast  12/04/2013   CLINICAL DATA:  Right facial numbness, right arm numbness blurry vision right eye  EXAM: CT HEAD WITHOUT CONTRAST  TECHNIQUE: Contiguous axial images were obtained from the base of the skull through the vertex without intravenous contrast.  COMPARISON:  08/21/2013  FINDINGS: Calvarium is intact. No significant inflammatory change in the visualized portions of the paranasal sinuses. Very mild age-related cortical atrophy. No hydrocephalus. No evidence of mass or infarct. No hemorrhage or extra-axial fluid.  IMPRESSION: No acute abnormalities   Electronically Signed   By: Skipper Cliche M.D.   On: 12/04/2013 15:40   Mr Brain Wo Contrast  12/05/2013   CLINICAL DATA:  Sudden onset of right arm and leg weakness with facial numbness.  EXAM: MRI HEAD WITHOUT CONTRAST  TECHNIQUE: Multiplanar,  multiecho pulse sequences of the brain and surrounding structures were obtained without intravenous contrast.  COMPARISON:  Head CT 12/04/2013 and MRI 04/21/2013  FINDINGS: There is no acute infarct. Ventricles and sulci are normal for age. There is no evidence of intracranial hemorrhage, mass, midline shift, or extra-axial fluid collection. No brain parenchymal signal abnormality is identified.  Orbits are unremarkable. Paranasal sinuses are clear. There is a small right mastoid effusion. Major intracranial vascular flow voids are preserved. Calvarium and scalp soft tissues are unremarkable.  IMPRESSION: Unremarkable appearance of the brain without evidence of acute intracranial abnormality.   Electronically Signed   By: Logan Bores   On: 12/05/2013 14:36   Dg Abd 2 Views  12/05/2013   CLINICAL DATA:  Abdominal pain  EXAM: ABDOMEN - 2 VIEW  COMPARISON:  11/25/2013  FINDINGS: The bowel gas pattern is normal. There is no evidence of free air. No radio-opaque calculi or other significant radiographic abnormality is seen.  IMPRESSION: Negative.   Electronically Signed   By: Kathreen Devoid   On: 12/05/2013 12:54   Dg Abd Acute W/chest  11/25/2013   CLINICAL DATA:  Pain and vomiting.  Dehydration.  EXAM: ACUTE ABDOMEN SERIES (ABDOMEN 2 VIEW & CHEST 1 VIEW)  COMPARISON:  Two-view chest 11/19/2013.  FINDINGS: The heart size is normal. Chronic interstitial coarsening is stable.  Supine and upright views the abdomen demonstrate a nonspecific bowel gas pattern. There is no evidence for obstruction or free air. The axial skeleton is unremarkable.  IMPRESSION: Negative chest and abdomen.   Electronically Signed   By: Lawrence Santiago M.D.   On: 11/25/2013 16:52    Microbiology: No results found for this or any previous visit (from the past 240 hour(s)).   Labs: Basic Metabolic Panel:  Recent Labs Lab 12/04/13 1522  NA 138  K 4.1  CL 99  CO2 24  GLUCOSE 317*  BUN 11  CREATININE 0.70  CALCIUM 9.1   Liver  Function Tests:  Recent Labs Lab 12/04/13 1522  AST 25  ALT 29  ALKPHOS 54  BILITOT 0.2*  PROT 6.8  ALBUMIN 3.9   No results found for this basename: LIPASE, AMYLASE,  in the last 168 hours No results found for this basename: AMMONIA,  in the last 168 hours CBC:  Recent Labs Lab 12/04/13 1522  WBC 4.8  HGB 14.4  HCT 40.9  MCV 87.2  PLT 187   Cardiac Enzymes:  Recent Labs Lab 12/04/13 1522 12/04/13 1806 12/04/13 2345 12/05/13 0640  TROPONINI <0.30 <0.30 <0.30 <0.30   BNP: BNP (last 3 results)  Recent Labs  04/18/13 1643 11/19/13 1733  PROBNP 31.0 16.3   CBG:  Recent Labs Lab 12/04/13 1450 12/04/13 2003 12/05/13 0745 12/05/13 1301  GLUCAP 300* 188* 191* 180*       Signed:  Jontavia Leatherbury  [facility] 12/06/2013, 2:59 PM

## 2013-12-05 NOTE — Progress Notes (Signed)
OT Cancellation Note  Patient Details Name: Bryan Becker MRN: [id number] DOB: May 02, 1961   Cancelled Treatment:    Reason Eval/Treat Not Completed: Patient at procedure or test/ unavailable (MRI) Will continue to follow.  Malka So 12/05/2013, 11:36 AM

## 2013-12-05 NOTE — Progress Notes (Signed)
Dc instructions reviewed with patient, questions answered, verbalized understanding.  Patient awaiting script for nausea and ride.

## 2013-12-06 NOTE — Progress Notes (Signed)
Late entry for 12/05/13, missed G-code.  12-19-2013 1300  PT G-Codes **NOT FOR INPATIENT CLASS**  Functional Assessment Tool Used clinical judgment  Functional Limitation Mobility: Walking and moving around  Mobility: Walking and Moving Around Current Status (R0413) CJ  Mobility: Walking and Moving Around Goal Status (S4383) CI    Candy Sledge, PT, DPT [phone]

## 2013-12-16 NOTE — ED Provider Notes (Signed)
.  I saw and evaluated the patient, reviewed the resident's note and I agree with the findings and plan.   EKG Interpretation   Date/Time:  Wednesday December 04 2013 17:31:29 EDT Ventricular Rate:  77 PR Interval:  123 QRS Duration: 91 QT Interval:  390 QTC Calculation: 441 R Axis:   70 Text Interpretation:  Sinus rhythm Borderline repol abnrm, anterolateral  leads Baseline wander in lead(s) V3 V4 V5 V6 ED PHYSICIAN INTERPRETATION  AVAILABLE IN [facility] Confirmed by TEST, Record ([id number]) on  12/06/2013 10:00:11 AM        Tanna Furry, MD 12/16/13 0001

## 2013-12-20 ENCOUNTER — Encounter (HOSPITAL_BASED_OUTPATIENT_CLINIC_OR_DEPARTMENT_OTHER): Payer: Self-pay | Admitting: Emergency Medicine

## 2013-12-20 ENCOUNTER — Emergency Department (HOSPITAL_BASED_OUTPATIENT_CLINIC_OR_DEPARTMENT_OTHER)
Admission: EM | Admit: 2013-12-20 | Discharge: 2013-12-20 | Disposition: A | Discharge status: Home / Self Care | Payer: Medicaid Other | Attending: Emergency Medicine | Admitting: Emergency Medicine

## 2013-12-20 ENCOUNTER — Emergency Department (HOSPITAL_BASED_OUTPATIENT_CLINIC_OR_DEPARTMENT_OTHER): Payer: Medicaid Other

## 2013-12-20 DIAGNOSIS — Z79899 Other long term (current) drug therapy: Secondary | ICD-10-CM | POA: Diagnosis not present

## 2013-12-20 DIAGNOSIS — Z7902 Long term (current) use of antithrombotics/antiplatelets: Secondary | ICD-10-CM | POA: Insufficient documentation

## 2013-12-20 DIAGNOSIS — Z8673 Personal history of transient ischemic attack (TIA), and cerebral infarction without residual deficits: Secondary | ICD-10-CM | POA: Insufficient documentation

## 2013-12-20 DIAGNOSIS — R197 Diarrhea, unspecified: Secondary | ICD-10-CM | POA: Insufficient documentation

## 2013-12-20 DIAGNOSIS — Z794 Long term (current) use of insulin: Secondary | ICD-10-CM | POA: Insufficient documentation

## 2013-12-20 DIAGNOSIS — F411 Generalized anxiety disorder: Secondary | ICD-10-CM | POA: Diagnosis not present

## 2013-12-20 DIAGNOSIS — J45901 Unspecified asthma with (acute) exacerbation: Secondary | ICD-10-CM

## 2013-12-20 DIAGNOSIS — M129 Arthropathy, unspecified: Secondary | ICD-10-CM | POA: Diagnosis not present

## 2013-12-20 DIAGNOSIS — R5381 Other malaise: Secondary | ICD-10-CM | POA: Insufficient documentation

## 2013-12-20 DIAGNOSIS — Z87891 Personal history of nicotine dependence: Secondary | ICD-10-CM | POA: Diagnosis not present

## 2013-12-20 DIAGNOSIS — G4733 Obstructive sleep apnea (adult) (pediatric): Secondary | ICD-10-CM | POA: Insufficient documentation

## 2013-12-20 DIAGNOSIS — G8929 Other chronic pain: Secondary | ICD-10-CM | POA: Insufficient documentation

## 2013-12-20 DIAGNOSIS — J441 Chronic obstructive pulmonary disease with (acute) exacerbation: Secondary | ICD-10-CM | POA: Diagnosis not present

## 2013-12-20 DIAGNOSIS — Z9981 Dependence on supplemental oxygen: Secondary | ICD-10-CM | POA: Diagnosis not present

## 2013-12-20 DIAGNOSIS — I10 Essential (primary) hypertension: Secondary | ICD-10-CM | POA: Insufficient documentation

## 2013-12-20 DIAGNOSIS — R5383 Other fatigue: Secondary | ICD-10-CM

## 2013-12-20 DIAGNOSIS — R109 Unspecified abdominal pain: Secondary | ICD-10-CM | POA: Diagnosis not present

## 2013-12-20 DIAGNOSIS — I509 Heart failure, unspecified: Secondary | ICD-10-CM | POA: Diagnosis not present

## 2013-12-20 DIAGNOSIS — IMO0002 Reserved for concepts with insufficient information to code with codable children: Secondary | ICD-10-CM | POA: Diagnosis not present

## 2013-12-20 DIAGNOSIS — E119 Type 2 diabetes mellitus without complications: Secondary | ICD-10-CM | POA: Insufficient documentation

## 2013-12-20 DIAGNOSIS — Z87442 Personal history of urinary calculi: Secondary | ICD-10-CM | POA: Insufficient documentation

## 2013-12-20 DIAGNOSIS — K219 Gastro-esophageal reflux disease without esophagitis: Secondary | ICD-10-CM | POA: Insufficient documentation

## 2013-12-20 DIAGNOSIS — Z8701 Personal history of pneumonia (recurrent): Secondary | ICD-10-CM | POA: Insufficient documentation

## 2013-12-20 DIAGNOSIS — R112 Nausea with vomiting, unspecified: Secondary | ICD-10-CM | POA: Diagnosis not present

## 2013-12-20 DIAGNOSIS — I252 Old myocardial infarction: Secondary | ICD-10-CM | POA: Diagnosis not present

## 2013-12-20 LAB — COMPREHENSIVE METABOLIC PANEL
ALBUMIN: 3.8 g/dL (ref 3.5–5.2)
ALK PHOS: 44 U/L (ref 39–117)
ALT: 33 U/L (ref 0–53)
ANION GAP: 12 (ref 5–15)
AST: 25 U/L (ref 0–37)
BUN: 11 mg/dL (ref 6–23)
CHLORIDE: 102 meq/L (ref 96–112)
CO2: 27 mEq/L (ref 19–32)
Calcium: 9.3 mg/dL (ref 8.4–10.5)
Creatinine, Ser: 0.6 mg/dL (ref 0.50–1.35)
GFR calc non Af Amer: >90 mL/min (ref >90)
GLUCOSE: 179 mg/dL — AB (ref 70–99)
Potassium: 4 mEq/L (ref 3.7–5.3)
Sodium: 141 mEq/L (ref 137–147)
TOTAL PROTEIN: 6.6 g/dL (ref 6.0–8.3)
Total Bilirubin: 0.2 mg/dL — ABNORMAL LOW (ref 0.3–1.2)

## 2013-12-20 LAB — CBC WITH DIFFERENTIAL/PLATELET
Basophils Absolute: 0.1 10*3/uL (ref 0.0–0.1)
Basophils Relative: 1 % (ref 0–1)
EOS ABS: 0.1 10*3/uL (ref 0.0–0.7)
Eosinophils Relative: 3 % (ref 0–5)
HCT: 40.9 % (ref 39.0–52.0)
HEMOGLOBIN: 14 g/dL (ref 13.0–17.0)
LYMPHS ABS: 1.7 10*3/uL (ref 0.7–4.0)
Lymphocytes Relative: 34 % (ref 12–46)
MCH: 30 pg (ref 26.0–34.0)
MCHC: 34.2 g/dL (ref 30.0–36.0)
MCV: 87.8 fL (ref 78.0–100.0)
MONOS PCT: 9 % (ref 3–12)
Monocytes Absolute: 0.4 10*3/uL (ref 0.1–1.0)
NEUTROS ABS: 2.7 10*3/uL (ref 1.7–7.7)
NEUTROS PCT: 54 % (ref 43–77)
PLATELETS: 165 10*3/uL (ref 150–400)
RBC: 4.66 MIL/uL (ref 4.22–5.81)
RDW: 13.3 % (ref 11.5–15.5)
WBC: 5 10*3/uL (ref 4.0–10.5)

## 2013-12-20 LAB — URINALYSIS, ROUTINE W REFLEX MICROSCOPIC
Bilirubin Urine: NEGATIVE
Glucose, UA: >1000 mg/dL — AB
Hgb urine dipstick: NEGATIVE
Ketones, ur: NEGATIVE mg/dL
LEUKOCYTES UA: NEGATIVE
NITRITE: NEGATIVE
PH: 5.5 (ref 5.0–8.0)
Protein, ur: NEGATIVE mg/dL
Specific Gravity, Urine: 1.02 (ref 1.005–1.030)
UROBILINOGEN UA: 0.2 mg/dL (ref 0.0–1.0)

## 2013-12-20 LAB — URINE MICROSCOPIC-ADD ON

## 2013-12-20 LAB — LIPASE, BLOOD: Lipase: 26 U/L (ref 11–59)

## 2013-12-20 MED ORDER — SODIUM CHLORIDE 0.9 % IV BOLUS (SEPSIS)
1000.0000 mL | Freq: Once | INTRAVENOUS | Status: AC
  Administered 2013-12-20: 1000 mL via INTRAVENOUS

## 2013-12-20 MED ORDER — ONDANSETRON HCL 4 MG/2ML IJ SOLN
4.0000 mg | Freq: Once | INTRAMUSCULAR | Status: AC
  Administered 2013-12-20: 4 mg via INTRAVENOUS
  Filled 2013-12-20: qty 2

## 2013-12-20 NOTE — ED Provider Notes (Signed)
CSN: [id number]     Arrival date & time 12/20/13  1058 History   First MD Initiated Contact with Patient 12/20/13 1117     Chief Complaint  Patient presents with  . Abdominal Pain     (Consider location/radiation/quality/duration/timing/severity/associated sxs/prior Treatment) HPI Comments: Patient presents with abdominal pain associated nausea vomiting diarrhea for the last 3 days. Spin intermittent. He's had increased fatigue. He states she's had this chronically and generally has episodes every few weeks. He states he comes to the emergency room to get some IV fluids and generally feels better after this. He has been seen by gastroenterology and had an endoscopy as well as a colonoscopy and imaging studies per the patient's report. He states they haven't found an etiology for his symptoms. He has been told by his primary care physician that he gets dehydrated and therefore comes to the ER to get IV fluids. He has been on Lasix 80 mg a day for congestive heart failure however this is been reduced to 40 mg a day 2 weeks ago. He states he doesn't weigh himself daily and hasn't had any change in his weight. He does report some mild increase in her shortness of breath symptoms which he feels is related to his COPD. He's been using his inhalers at home with improvement.  Patient is a 53 y.o. male presenting with abdominal pain.  Abdominal Pain Associated symptoms: diarrhea, fatigue, nausea, shortness of breath and vomiting   Associated symptoms: no chest pain, no chills, no cough, no fever and no hematuria     Past Medical History  Diagnosis Date  . Hypertension   . Pancreatitis   . CHF (congestive heart failure)   . 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 Seasonal allergies   . Asthma   . Anxiety   . COPD (chronic obstructive pulmonary disease)   . Hyperlipidemia   . Gastric ulcer   . Fibromyalgia   . Kidney stone   . Myocardial infarction 1995  . Type II diabetes mellitus    . Pneumonia     "several times"  . Chronic bronchitis     "get it q yr"  . OSA (obstructive sleep apnea) 04/24/2013    "use BiPAP"  . GERD (gastroesophageal reflux disease)   . PJASNKNL(976.7)     "weekly" (12/04/2013)  . Arthritis     "joints"  . Chronic back pain     "mostly lower; have 2 bulging discs in my neck" (12/05/2013)  . Stroke 03/2013    "still weak on the left side" (12/05/2013)  . Stroke 12/04/2013    "came in today w/right sided weakness" (12/04/2013)   Past Surgical History  Procedure Laterality Date  . Cholecystectomy    . Multiple cysts removal-hip,wrist    . Upper gastrointestinal endoscopy    . Colonoscopy    . Tonsillectomy  ~ 1985  . Cardiac catheterization    . Cystoscopy w/ stone manipulation     Family History  Problem Relation Age of Onset  . Colon cancer Neg Hx   . Esophageal cancer Neg Hx   . Rectal cancer Neg Hx   . Skin cancer Father   . Lung cancer Paternal Grandfather   . Stomach cancer Maternal Grandfather   . Bone cancer Paternal Grandmother   . Diabetes Mother   . Diabetes Maternal Grandmother   . Heart disease Maternal Grandmother   . Heart disease Father   . Heart disease Paternal Grandfather   .  Heart attack Paternal Grandmother    History  Substance Use Topics  . Smoking status: Former Smoker -- 2.00 packs/day for 21 years    Types: Cigarettes    Quit date: 05/23/2002  . Smokeless tobacco: Never Used  . Alcohol Use: No    Review of Systems  Constitutional: Positive for fatigue. Negative for fever, chills and diaphoresis.  HENT: Negative for congestion, rhinorrhea and sneezing.   Eyes: Negative.   Respiratory: Positive for shortness of breath. Negative for cough and chest tightness.   Cardiovascular: Negative for chest pain and leg swelling.  Gastrointestinal: Positive for nausea, vomiting, abdominal pain and diarrhea. Negative for blood in stool.  Genitourinary: Negative for frequency, hematuria, flank pain and difficulty  urinating.  Musculoskeletal: Negative for arthralgias and back pain.  Skin: Negative for rash.  Neurological: Negative for dizziness, speech difficulty, weakness, numbness and headaches.      Allergies  Codeine  Home Medications   Prior to Admission medications   Medication Sig Start Date End Date Taking? Authorizing Provider  albuterol (PROVENTIL HFA;VENTOLIN HFA) 108 (90 BASE) MCG/ACT inhaler Inhale 2 puffs into the lungs every 6 (six) hours as needed. For shortness of breath    Yes Historical Provider, MD  citalopram (CELEXA) 40 MG tablet Take 40 mg by mouth daily.     Yes Historical Provider, MD  clopidogrel (PLAVIX) 75 MG tablet Take 75 mg by mouth daily.   Yes Historical Provider, MD  cyanocobalamin (,VITAMIN B-12,) 1000 MCG/ML injection Inject 1,000 mcg into the muscle once a week.    Yes Historical Provider, MD  donepezil (ARICEPT) 10 MG tablet Take 10 mg by mouth at bedtime.   Yes Historical Provider, MD  fluticasone (FLONASE) 50 MCG/ACT nasal spray Place 2 sprays into both nostrils daily.   Yes Historical Provider, MD  furosemide (LASIX) 40 MG tablet Take 1 tablet (40 mg total) by mouth daily. 12/05/13  Yes Geradine Girt, DO  gabapentin (NEURONTIN) 600 MG tablet Take 1,200 mg by mouth 3 (three) times daily.   Yes Historical Provider, MD  glimepiride (AMARYL) 4 MG tablet Take 2 mg by mouth 2 (two) times daily.    Yes Historical Provider, MD  HYDROcodone-acetaminophen (NORCO) 10-325 MG per tablet Take 1 tablet by mouth every 6 (six) hours.   Yes Historical Provider, MD  insulin detemir (LEVEMIR) 100 UNIT/ML injection Inject 60 Units into the skin at bedtime.    Yes Historical Provider, MD  insulin lispro protamine-lispro (HUMALOG 50/50 MIX) (50-50) 100 UNIT/ML SUSP injection Inject into the skin 2 (two) times daily before a meal.   Yes Historical Provider, MD  Liraglutide (VICTOZA) 18 MG/3ML SOLN Inject 1.8 mg into the skin daily.    Yes Historical Provider, MD  lisinopril  (PRINIVIL,ZESTRIL) 40 MG tablet Take 40 mg by mouth daily.    Yes Historical Provider, MD  LORazepam (ATIVAN) 1 MG tablet Take 1 mg by mouth 2 (two) times daily.     Yes Historical Provider, MD  omeprazole (PRILOSEC) 20 MG capsule Take 20 mg by mouth at bedtime.    Yes Historical Provider, MD  ondansetron (ZOFRAN) 4 MG tablet Take 1 tablet (4 mg total) by mouth every 8 (eight) hours as needed for nausea or vomiting. 12/05/13  Yes Geradine Girt, DO  Potassium Gluconate 595 MG CAPS Take 595 mg by mouth daily.    Yes Historical Provider, MD  tiotropium (SPIRIVA) 18 MCG inhalation capsule Place 1 capsule (18 mcg total) into inhaler and inhale daily  as needed (wheezing). 12/05/13  Yes Jessica U Vann, DO   BP 150/73  Pulse 71  Temp(Src) 98.1 F (36.7 C) (Oral)  Resp 18  Ht 5' 9"  (1.753 m)  Wt 282 lb (127.914 kg)  BMI 41.63 kg/m2  SpO2 96% Physical Exam  Constitutional: He is oriented to person, place, and time. He appears well-developed and well-nourished.  Morbidly obese  HENT:  Head: Normocephalic and atraumatic.  Eyes: Pupils are equal, round, and reactive to light.  Neck: Normal range of motion. Neck supple.  Cardiovascular: Normal rate, regular rhythm and normal heart sounds.   Pulmonary/Chest: Effort normal and breath sounds normal. No respiratory distress. He has no wheezes. He has no rales. He exhibits no tenderness.  Abdominal: Soft. Bowel sounds are normal. There is tenderness (mild diffuse tenderness). There is no rebound and no guarding.  Musculoskeletal: Normal range of motion. He exhibits edema (Mild bilateral pitting edema).  Lymphadenopathy:    He has no cervical adenopathy.  Neurological: He is alert and oriented to person, place, and time.  Skin: Skin is warm and dry. No rash noted.  Psychiatric: He has a normal mood and affect.    ED Course  Procedures (including critical care time) Labs Review Results for orders placed during the hospital encounter of 12/20/13   URINALYSIS, ROUTINE W REFLEX MICROSCOPIC      Result Value Ref Range   Color, Urine YELLOW  YELLOW   APPearance CLEAR  CLEAR   Specific Gravity, Urine 1.020  1.005 - 1.030   pH 5.5  5.0 - 8.0   Glucose, UA >1000 (*) NEGATIVE mg/dL   Hgb urine dipstick NEGATIVE  NEGATIVE   Bilirubin Urine NEGATIVE  NEGATIVE   Ketones, ur NEGATIVE  NEGATIVE mg/dL   Protein, ur NEGATIVE  NEGATIVE mg/dL   Urobilinogen, UA 0.2  0.0 - 1.0 mg/dL   Nitrite NEGATIVE  NEGATIVE   Leukocytes, UA NEGATIVE  NEGATIVE  CBC WITH DIFFERENTIAL      Result Value Ref Range   WBC 5.0  4.0 - 10.5 K/uL   RBC 4.66  4.22 - 5.81 MIL/uL   Hemoglobin 14.0  13.0 - 17.0 g/dL   HCT 40.9  39.0 - 52.0 %   MCV 87.8  78.0 - 100.0 fL   MCH 30.0  26.0 - 34.0 pg   MCHC 34.2  30.0 - 36.0 g/dL   RDW 13.3  11.5 - 15.5 %   Platelets 165  150 - 400 K/uL   Neutrophils Relative % 54  43 - 77 %   Neutro Abs 2.7  1.7 - 7.7 K/uL   Lymphocytes Relative 34  12 - 46 %   Lymphs Abs 1.7  0.7 - 4.0 K/uL   Monocytes Relative 9  3 - 12 %   Monocytes Absolute 0.4  0.1 - 1.0 K/uL   Eosinophils Relative 3  0 - 5 %   Eosinophils Absolute 0.1  0.0 - 0.7 K/uL   Basophils Relative 1  0 - 1 %   Basophils Absolute 0.1  0.0 - 0.1 K/uL  COMPREHENSIVE METABOLIC PANEL      Result Value Ref Range   Sodium 141  137 - 147 mEq/L   Potassium 4.0  3.7 - 5.3 mEq/L   Chloride 102  96 - 112 mEq/L   CO2 27  19 - 32 mEq/L   Glucose, Bld 179 (*) 70 - 99 mg/dL   BUN 11  6 - 23 mg/dL   Creatinine, Ser 0.60  0.50 - 1.35 mg/dL   Calcium 9.3  8.4 - 10.5 mg/dL   Total Protein 6.6  6.0 - 8.3 g/dL   Albumin 3.8  3.5 - 5.2 g/dL   AST 25  0 - 37 U/L   ALT 33  0 - 53 U/L   Alkaline Phosphatase 44  39 - 117 U/L   Total Bilirubin 0.2 (*) 0.3 - 1.2 mg/dL   GFR calc non Af Amer >90  >90 mL/min   GFR calc Af Amer >90  >90 mL/min   Anion gap 12  5 - 15  LIPASE, BLOOD      Result Value Ref Range   Lipase 26  11 - 59 U/L  URINE MICROSCOPIC-ADD ON      Result Value Ref  Range   Squamous Epithelial / LPF RARE  RARE   RBC / HPF 0-2  <3 RBC/hpf   Urine-Other MUCOUS PRESENT       Dg Abd Acute W/chest  12/20/2013   CLINICAL DATA:  Abdominal pain.  EXAM: ACUTE ABDOMEN SERIES (ABDOMEN 2 VIEW & CHEST 1 VIEW)  COMPARISON:  December 05, 2013.  FINDINGS: There is no evidence of dilated bowel loops or free intraperitoneal air. No radiopaque calculi or other significant radiographic abnormality is seen. Heart size and mediastinal contours are within normal limits. Both lungs are clear. Status post cholecystectomy.  IMPRESSION: Negative abdominal radiographs.  No acute cardiopulmonary disease.   Electronically Signed   By: Sabino Dick M.D.   On: 12/20/2013 12:02    Imaging Review Dg Abd Acute W/chest  12/20/2013   CLINICAL DATA:  Abdominal pain.  EXAM: ACUTE ABDOMEN SERIES (ABDOMEN 2 VIEW & CHEST 1 VIEW)  COMPARISON:  December 05, 2013.  FINDINGS: There is no evidence of dilated bowel loops or free intraperitoneal air. No radiopaque calculi or other significant radiographic abnormality is seen. Heart size and mediastinal contours are within normal limits. Both lungs are clear. Status post cholecystectomy.  IMPRESSION: Negative abdominal radiographs.  No acute cardiopulmonary disease.   Electronically Signed   By: Sabino Dick M.D.   On: 12/20/2013 12:02     EKG Interpretation None      MDM   Final diagnoses:  Abdominal pain, unspecified abdominal location    PT with recurrent episodes of abd pain with n/v/d.  No concerning findings today on lab, x-ray.  Pt has had extensive evaluation in the past.  Do not feel that CT is indicated.  Will give IVFs, f/u with his PMD.    Malvin Johns, MD 12/20/13 [phone]

## 2013-12-20 NOTE — Discharge Instructions (Signed)

## 2013-12-20 NOTE — ED Notes (Signed)
Pt having N/V/D with abdominal pain for three days.  No known fever.

## 2014-01-03 ENCOUNTER — Encounter (HOSPITAL_BASED_OUTPATIENT_CLINIC_OR_DEPARTMENT_OTHER): Payer: Self-pay | Admitting: Emergency Medicine

## 2014-01-03 ENCOUNTER — Emergency Department (HOSPITAL_BASED_OUTPATIENT_CLINIC_OR_DEPARTMENT_OTHER): Payer: Medicaid Other

## 2014-01-03 ENCOUNTER — Emergency Department (HOSPITAL_BASED_OUTPATIENT_CLINIC_OR_DEPARTMENT_OTHER)
Admission: EM | Admit: 2014-01-03 | Discharge: 2014-01-03 | Disposition: A | Discharge status: Home / Self Care | Payer: Medicaid Other | Attending: Emergency Medicine | Admitting: Emergency Medicine

## 2014-01-03 DIAGNOSIS — I509 Heart failure, unspecified: Secondary | ICD-10-CM | POA: Insufficient documentation

## 2014-01-03 DIAGNOSIS — Z794 Long term (current) use of insulin: Secondary | ICD-10-CM | POA: Insufficient documentation

## 2014-01-03 DIAGNOSIS — J449 Chronic obstructive pulmonary disease, unspecified: Secondary | ICD-10-CM | POA: Insufficient documentation

## 2014-01-03 DIAGNOSIS — Z79899 Other long term (current) drug therapy: Secondary | ICD-10-CM | POA: Diagnosis not present

## 2014-01-03 DIAGNOSIS — Z7902 Long term (current) use of antithrombotics/antiplatelets: Secondary | ICD-10-CM | POA: Diagnosis not present

## 2014-01-03 DIAGNOSIS — K219 Gastro-esophageal reflux disease without esophagitis: Secondary | ICD-10-CM | POA: Insufficient documentation

## 2014-01-03 DIAGNOSIS — I10 Essential (primary) hypertension: Secondary | ICD-10-CM | POA: Insufficient documentation

## 2014-01-03 DIAGNOSIS — G8929 Other chronic pain: Secondary | ICD-10-CM | POA: Diagnosis not present

## 2014-01-03 DIAGNOSIS — G4733 Obstructive sleep apnea (adult) (pediatric): Secondary | ICD-10-CM | POA: Diagnosis not present

## 2014-01-03 DIAGNOSIS — Z8701 Personal history of pneumonia (recurrent): Secondary | ICD-10-CM | POA: Insufficient documentation

## 2014-01-03 DIAGNOSIS — Z872 Personal history of diseases of the skin and subcutaneous tissue: Secondary | ICD-10-CM | POA: Insufficient documentation

## 2014-01-03 DIAGNOSIS — Z87442 Personal history of urinary calculi: Secondary | ICD-10-CM | POA: Diagnosis not present

## 2014-01-03 DIAGNOSIS — M129 Arthropathy, unspecified: Secondary | ICD-10-CM | POA: Insufficient documentation

## 2014-01-03 DIAGNOSIS — J4489 Other specified chronic obstructive pulmonary disease: Secondary | ICD-10-CM | POA: Insufficient documentation

## 2014-01-03 DIAGNOSIS — E119 Type 2 diabetes mellitus without complications: Secondary | ICD-10-CM | POA: Diagnosis not present

## 2014-01-03 DIAGNOSIS — Z8673 Personal history of transient ischemic attack (TIA), and cerebral infarction without residual deficits: Secondary | ICD-10-CM | POA: Insufficient documentation

## 2014-01-03 DIAGNOSIS — Z9981 Dependence on supplemental oxygen: Secondary | ICD-10-CM | POA: Diagnosis not present

## 2014-01-03 DIAGNOSIS — F411 Generalized anxiety disorder: Secondary | ICD-10-CM | POA: Insufficient documentation

## 2014-01-03 DIAGNOSIS — R197 Diarrhea, unspecified: Secondary | ICD-10-CM | POA: Diagnosis not present

## 2014-01-03 DIAGNOSIS — I252 Old myocardial infarction: Secondary | ICD-10-CM | POA: Insufficient documentation

## 2014-01-03 DIAGNOSIS — R112 Nausea with vomiting, unspecified: Secondary | ICD-10-CM | POA: Diagnosis present

## 2014-01-03 LAB — URINE MICROSCOPIC-ADD ON

## 2014-01-03 LAB — CBC WITH DIFFERENTIAL/PLATELET
Basophils Absolute: 0.1 10*3/uL (ref 0.0–0.1)
Basophils Relative: 1 % (ref 0–1)
EOS ABS: 0.1 10*3/uL (ref 0.0–0.7)
EOS PCT: 1 % (ref 0–5)
HCT: 41.7 % (ref 39.0–52.0)
HEMOGLOBIN: 14.4 g/dL (ref 13.0–17.0)
LYMPHS ABS: 1.7 10*3/uL (ref 0.7–4.0)
Lymphocytes Relative: 24 % (ref 12–46)
MCH: 30.1 pg (ref 26.0–34.0)
MCHC: 34.5 g/dL (ref 30.0–36.0)
MCV: 87.1 fL (ref 78.0–100.0)
MONO ABS: 0.5 10*3/uL (ref 0.1–1.0)
Monocytes Relative: 6 % (ref 3–12)
Neutro Abs: 4.9 10*3/uL (ref 1.7–7.7)
Neutrophils Relative %: 68 % (ref 43–77)
Platelets: 162 10*3/uL (ref 150–400)
RBC: 4.79 MIL/uL (ref 4.22–5.81)
RDW: 13.2 % (ref 11.5–15.5)
WBC: 7.3 10*3/uL (ref 4.0–10.5)

## 2014-01-03 LAB — URINALYSIS, ROUTINE W REFLEX MICROSCOPIC
BILIRUBIN URINE: NEGATIVE
Hgb urine dipstick: NEGATIVE
KETONES UR: NEGATIVE mg/dL
LEUKOCYTES UA: NEGATIVE
Nitrite: NEGATIVE
Protein, ur: NEGATIVE mg/dL
Specific Gravity, Urine: 1.022 (ref 1.005–1.030)
Urobilinogen, UA: 1 mg/dL (ref 0.0–1.0)
pH: 6.5 (ref 5.0–8.0)

## 2014-01-03 LAB — COMPREHENSIVE METABOLIC PANEL
ALT: 29 U/L (ref 0–53)
ANION GAP: 12 (ref 5–15)
AST: 24 U/L (ref 0–37)
Albumin: 4 g/dL (ref 3.5–5.2)
Alkaline Phosphatase: 48 U/L (ref 39–117)
BUN: 10 mg/dL (ref 6–23)
CALCIUM: 9.7 mg/dL (ref 8.4–10.5)
CO2: 26 mEq/L (ref 19–32)
Chloride: 100 mEq/L (ref 96–112)
Creatinine, Ser: 0.6 mg/dL (ref 0.50–1.35)
GFR calc Af Amer: >90 mL/min (ref >90)
GFR calc non Af Amer: >90 mL/min (ref >90)
GLUCOSE: 259 mg/dL — AB (ref 70–99)
Potassium: 4 mEq/L (ref 3.7–5.3)
Sodium: 138 mEq/L (ref 137–147)
TOTAL PROTEIN: 7 g/dL (ref 6.0–8.3)
Total Bilirubin: 0.3 mg/dL (ref 0.3–1.2)

## 2014-01-03 LAB — CBG MONITORING, ED
Glucose-Capillary: 258 mg/dL — ABNORMAL HIGH (ref 70–99)
Glucose-Capillary: 175 mg/dL — ABNORMAL HIGH (ref 70–99)

## 2014-01-03 LAB — LIPASE, BLOOD: Lipase: 26 U/L (ref 11–59)

## 2014-01-03 MED ORDER — OXYCODONE-ACETAMINOPHEN 5-325 MG PO TABS
1.0000 | ORAL_TABLET | Freq: Once | ORAL | Status: AC
  Administered 2014-01-03: 1 via ORAL
  Filled 2014-01-03: qty 1

## 2014-01-03 MED ORDER — SODIUM CHLORIDE 0.9 % IV BOLUS (SEPSIS)
1000.0000 mL | Freq: Once | INTRAVENOUS | Status: AC
  Administered 2014-01-03: 1000 mL via INTRAVENOUS

## 2014-01-03 MED ORDER — DICYCLOMINE HCL 10 MG PO CAPS
10.0000 mg | ORAL_CAPSULE | Freq: Once | ORAL | Status: AC
  Administered 2014-01-03: 10 mg via ORAL
  Filled 2014-01-03: qty 1

## 2014-01-03 MED ORDER — ONDANSETRON HCL 4 MG/2ML IJ SOLN
4.0000 mg | Freq: Once | INTRAMUSCULAR | Status: AC
  Administered 2014-01-03: 4 mg via INTRAVENOUS
  Filled 2014-01-03: qty 2

## 2014-01-03 MED ORDER — DICYCLOMINE HCL 10 MG PO CAPS: 10.0000 mg | ORAL_CAPSULE | Freq: Three times a day (TID) | ORAL | Status: DC

## 2014-01-03 MED ORDER — PANTOPRAZOLE SODIUM 40 MG IV SOLR
40.0000 mg | Freq: Once | INTRAVENOUS | Status: AC
  Administered 2014-01-03: 40 mg via INTRAVENOUS
  Filled 2014-01-03: qty 40

## 2014-01-03 MED ORDER — MORPHINE SULFATE 4 MG/ML IJ SOLN
4.0000 mg | Freq: Once | INTRAMUSCULAR | Status: AC
  Administered 2014-01-03: 4 mg via INTRAVENOUS
  Filled 2014-01-03: qty 1

## 2014-01-03 NOTE — ED Notes (Signed)
Pt reports n/v/d since yesterday

## 2014-01-03 NOTE — ED Notes (Signed)
EDP at patient bedside for assessment/update

## 2014-01-03 NOTE — ED Notes (Signed)
Pt tolerated diet sprite without difficulty. Pt has not had dry heaves or emesis since arrival to the ED

## 2014-01-03 NOTE — ED Provider Notes (Signed)
CSN: [id number]     Arrival date & time 01/03/14  1239 History   First MD Initiated Contact with Patient 01/03/14 1254     Chief Complaint  Patient presents with  . Emesis     (Consider location/radiation/quality/duration/timing/severity/associated sxs/prior Treatment) HPI  Patient presents with abdominal pain, nausea, vomiting, and diarrhea since yesterday. He reports a history of the same. He states that he frequently has to come in and get fluids for his symptoms. Symptoms started yesterday. He reports diffuse crampy abdominal pain that radiates to his back. It is mostly over his lower abdomen. Nothing seems to make it better or worse.  Patient reports pain is 10 out of 10. He has associated nonbilious, nonbloody emesis and has had multiple episodes of diarrhea.  He reports he has had an endoscopy and colonoscopy and there was "an ulcer found." He does take Prilosec. He reports increased belching. She denies any fever, chest pain, shortness of breath.  Past Medical History  Diagnosis Date  . Hypertension   . Pancreatitis   . CHF (congestive heart failure)   . 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 Seasonal allergies   . Asthma   . Anxiety   . COPD (chronic obstructive pulmonary disease)   . Hyperlipidemia   . Gastric ulcer   . Fibromyalgia   . Kidney stone   . Myocardial infarction 1995  . Type II diabetes mellitus   . Pneumonia     "several times"  . Chronic bronchitis     "get it q yr"  . OSA (obstructive sleep apnea) 04/24/2013    "use BiPAP"  . GERD (gastroesophageal reflux disease)   . JJHERDEY(814.4)     "weekly" (12/04/2013)  . Arthritis     "joints"  . Chronic back pain     "mostly lower; have 2 bulging discs in my neck" (12/05/2013)  . Stroke 03/2013    "still weak on the left side" (12/05/2013)  . Stroke 12/04/2013    "came in today w/right sided weakness" (12/04/2013)   Past Surgical History  Procedure Laterality Date  . Cholecystectomy    .  Multiple cysts removal-hip,wrist    . Upper gastrointestinal endoscopy    . Colonoscopy    . Tonsillectomy  ~ 1985  . Cardiac catheterization    . Cystoscopy w/ stone manipulation     Family History  Problem Relation Age of Onset  . Colon cancer Neg Hx   . Esophageal cancer Neg Hx   . Rectal cancer Neg Hx   . Skin cancer Father   . Lung cancer Paternal Grandfather   . Stomach cancer Maternal Grandfather   . Bone cancer Paternal Grandmother   . Diabetes Mother   . Diabetes Maternal Grandmother   . Heart disease Maternal Grandmother   . Heart attack Father   . Heart disease Paternal Grandfather   . Heart attack Paternal Grandmother   . Hypertension Mother   . Arthritis Mother   . Other Mother     brain stem vertigo  . Other Mother     disorder of pancreas  . Stomach cancer Father   . Hypertension Father    History  Substance Use Topics  . Smoking status: Former Smoker -- 2.00 packs/day for 21 years    Types: Cigarettes    Quit date: 05/23/2002  . Smokeless tobacco: Never Used  . Alcohol Use: No    Review of Systems  Constitutional: Negative.  Negative for fever.  Respiratory: Negative.  Negative for chest tightness and shortness of breath.   Cardiovascular: Negative.  Negative for chest pain.  Gastrointestinal: Positive for nausea, vomiting, abdominal pain and diarrhea. Negative for blood in stool.  Genitourinary: Negative.  Negative for dysuria.  Skin: Negative for rash.  Neurological: Negative for headaches.  All other systems reviewed and are negative.     Allergies  Codeine  Home Medications   Prior to Admission medications   Medication Sig Start Date End Date Taking? Authorizing Provider  albuterol (PROVENTIL HFA;VENTOLIN HFA) 108 (90 BASE) MCG/ACT inhaler Inhale 2 puffs into the lungs every 6 (six) hours as needed. For shortness of breath    Yes Historical Provider, MD  citalopram (CELEXA) 40 MG tablet Take 40 mg by mouth daily.     Yes Historical  Provider, MD  clopidogrel (PLAVIX) 75 MG tablet Take 75 mg by mouth daily.   Yes Historical Provider, MD  cyanocobalamin (,VITAMIN B-12,) 1000 MCG/ML injection Inject 1,000 mcg into the muscle once a week.    Yes Historical Provider, MD  donepezil (ARICEPT) 10 MG tablet Take 10 mg by mouth at bedtime.   Yes Historical Provider, MD  fluticasone (FLONASE) 50 MCG/ACT nasal spray Place 2 sprays into both nostrils daily.   Yes Historical Provider, MD  furosemide (LASIX) 40 MG tablet Take 1 tablet (40 mg total) by mouth daily. 12/05/13  Yes Geradine Girt, DO  gabapentin (NEURONTIN) 600 MG tablet Take 1,200 mg by mouth 3 (three) times daily.   Yes Historical Provider, MD  glimepiride (AMARYL) 4 MG tablet Take 2 mg by mouth 2 (two) times daily.    Yes Historical Provider, MD  HYDROcodone-acetaminophen (NORCO) 10-325 MG per tablet Take 1 tablet by mouth every 6 (six) hours.   Yes Historical Provider, MD  insulin detemir (LEVEMIR) 100 UNIT/ML injection Inject 60 Units into the skin at bedtime.    Yes Historical Provider, MD  insulin lispro protamine-lispro (HUMALOG 50/50 MIX) (50-50) 100 UNIT/ML SUSP injection Inject into the skin 2 (two) times daily before a meal.   Yes Historical Provider, MD  Liraglutide (VICTOZA) 18 MG/3ML SOLN Inject 1.8 mg into the skin daily.    Yes Historical Provider, MD  lisinopril (PRINIVIL,ZESTRIL) 40 MG tablet Take 40 mg by mouth daily.    Yes Historical Provider, MD  LORazepam (ATIVAN) 1 MG tablet Take 1 mg by mouth 2 (two) times daily.     Yes Historical Provider, MD  omeprazole (PRILOSEC) 20 MG capsule Take 20 mg by mouth at bedtime.    Yes Historical Provider, MD  ondansetron (ZOFRAN) 4 MG tablet Take 1 tablet (4 mg total) by mouth every 8 (eight) hours as needed for nausea or vomiting. 12/05/13  Yes Geradine Girt, DO  Potassium Gluconate 595 MG CAPS Take 595 mg by mouth daily.    Yes Historical Provider, MD  tiotropium (SPIRIVA) 18 MCG inhalation capsule Place 1 capsule (18  mcg total) into inhaler and inhale daily as needed (wheezing). 12/05/13  Yes Geradine Girt, DO  dicyclomine (BENTYL) 10 MG capsule Take 1 capsule (10 mg total) by mouth 3 (three) times daily before meals. 01/03/14   Merryl Hacker, MD   BP 162/80  Pulse 95  Temp(Src) 98 F (36.7 C) (Oral)  Resp 18  Ht 5' 9"  (1.753 m)  Wt 280 lb (127.007 kg)  BMI 41.33 kg/m2  SpO2 96% Physical Exam  Nursing note and vitals reviewed. Constitutional: He is oriented to person, place, and time.  No distress.  HENT:  Head: Normocephalic and atraumatic.  Mouth/Throat: Oropharynx is clear and moist.  Eyes: Pupils are equal, round, and reactive to light.  Cardiovascular: Normal rate, regular rhythm and normal heart sounds.   No murmur heard. Pulmonary/Chest: Effort normal and breath sounds normal. No respiratory distress. He has no wheezes.  Abdominal: Soft. Bowel sounds are normal. There is no tenderness. There is no rebound and no guarding.  Protuberant  Musculoskeletal: He exhibits edema.  Trace bilateral lower extremity edema  Neurological: He is alert and oriented to person, place, and time.  Skin: Skin is warm and dry.  Psychiatric: He has a normal mood and affect.    ED Course  Procedures (including critical care time) Labs Review Labs Reviewed  COMPREHENSIVE METABOLIC PANEL - Abnormal; Notable for the following:    Glucose, Bld 259 (*)    All other components within normal limits  URINALYSIS, ROUTINE W REFLEX MICROSCOPIC - Abnormal; Notable for the following:    Glucose, UA >1000 (*)    All other components within normal limits  CBG MONITORING, ED - Abnormal; Notable for the following:    Glucose-Capillary 258 (*)    All other components within normal limits  CBG MONITORING, ED - Abnormal; Notable for the following:    Glucose-Capillary 175 (*)    All other components within normal limits  CBC WITH DIFFERENTIAL  LIPASE, BLOOD  URINE MICROSCOPIC-ADD ON    Imaging Review Dg Abd  Acute W/chest  01/03/2014   CLINICAL DATA:  53 year old male with nausea vomiting diarrhea and right hip pain. Initial encounter.  EXAM: ACUTE ABDOMEN SERIES (ABDOMEN 2 VIEW & CHEST 1 VIEW)  COMPARISON:  12/20/2013 and earlier.  FINDINGS: Mildly lower lung volumes. Normal cardiac size and mediastinal contours. Visualized tracheal air column is within normal limits. No pneumothorax or pneumoperitoneum. Mild crowding of lung markings at the lung bases.  Paucity of bowel gas today. Mostly right lower quadrant nondilated gas-filled small and large bowel is evident. Stable right upper quadrant surgical clips. Abdominal and pelvic visceral contours appear stable and within normal limits.  Femoral heads remain normally located in hip joint spaces are stable and within normal limits. Proximal femurs appear intact. No acute osseous abnormality identified.  IMPRESSION: 1.  Non obstructed bowel gas pattern, no free air. 2.  No acute cardiopulmonary abnormality. 3.  No acute osseous abnormality evident about the right hip.   Electronically Signed   By: Lars Pinks M.D.   On: 01/03/2014 13:52     EKG Interpretation None      MDM   Final diagnoses:  Nausea vomiting and diarrhea    Patient presents with n/v/d and abdominal pain.  Patient has history of similar symptoms.  Has had extensive w/u in the past including CT scans.  Referred to GI.  Nontoxic on exam.  VS reassuring.  Patient given pain, nausea meds and fluids.  W/u is unremarkable and patient able to PO challenge. Repeat exam continues to be reassuring. Will have patient follow-up as scheduled with GI.  Will add Bentyl to home regimen.  After history, exam, and medical workup I feel the patient has been appropriately medically screened and is safe for discharge home. Pertinent diagnoses were discussed with the patient. Patient was given return precautions.     Merryl Hacker, MD 01/03/14 [phone]

## 2014-01-03 NOTE — Discharge Instructions (Signed)
Abdominal Pain  You have been evaluated multiple times for your abdominal pain, vomiting, diarrhea.  Your work-up today is reassuring.  YOu need to follow-up with GI as scheduled.  Many things can cause abdominal pain. Usually, abdominal pain is not caused by a disease and will improve without treatment. It can often be observed and treated at home. Your health care provider will do a physical exam and possibly order blood tests and X-rays to help determine the seriousness of your pain. However, in many cases, more time must pass before a clear cause of the pain can be found. Before that point, your health care provider may not know if you need more testing or further treatment. HOME CARE INSTRUCTIONS  Monitor your abdominal pain for any changes. The following actions may help to alleviate any discomfort you are experiencing:  Only take over-the-counter or prescription medicines as directed by your health care provider.  Do not take laxatives unless directed to do so by your health care provider.  Try a clear liquid diet (broth, tea, or water) as directed by your health care provider. Slowly move to a bland diet as tolerated. SEEK MEDICAL CARE IF:  You have unexplained abdominal pain.  You have abdominal pain associated with nausea or diarrhea.  You have pain when you urinate or have a bowel movement.  You experience abdominal pain that wakes you in the night.  You have abdominal pain that is worsened or improved by eating food.  You have abdominal pain that is worsened with eating fatty foods.  You have a fever. SEEK IMMEDIATE MEDICAL CARE IF:   Your pain does not go away within 2 hours.  You keep throwing up (vomiting).  Your pain is felt only in portions of the abdomen, such as the right side or the left lower portion of the abdomen.  You pass bloody or black tarry stools. MAKE SURE YOU:  Understand these instructions.   Will watch your condition.   Will get help right  away if you are not doing well or get worse.  Document Released: 02/16/2005 Document Revised: 05/14/2013 Document Reviewed: 01/16/2013 [facility] Patient Information 2015 [place], [place]. This information is not intended to replace advice given to you by your health care provider. Make sure you discuss any questions you have with your health care provider.

## 2014-01-10 ENCOUNTER — Encounter: Payer: Self-pay | Admitting: Internal Medicine

## 2014-01-10 ENCOUNTER — Ambulatory Visit (INDEPENDENT_AMBULATORY_CARE_PROVIDER_SITE_OTHER): Payer: Medicaid Other | Admitting: Internal Medicine

## 2014-01-10 VITALS — BP 146/78 | HR 84 | Ht 68.0 in | Wt 285.2 lb

## 2014-01-10 DIAGNOSIS — E1165 Type 2 diabetes mellitus with hyperglycemia: Secondary | ICD-10-CM

## 2014-01-10 DIAGNOSIS — R1115 Cyclical vomiting syndrome unrelated to migraine: Secondary | ICD-10-CM

## 2014-01-10 DIAGNOSIS — IMO0002 Reserved for concepts with insufficient information to code with codable children: Secondary | ICD-10-CM

## 2014-01-10 DIAGNOSIS — K633 Ulcer of intestine: Secondary | ICD-10-CM

## 2014-01-10 DIAGNOSIS — IMO0001 Reserved for inherently not codable concepts without codable children: Secondary | ICD-10-CM

## 2014-01-10 DIAGNOSIS — R197 Diarrhea, unspecified: Secondary | ICD-10-CM

## 2014-01-10 DIAGNOSIS — R1110 Vomiting, unspecified: Secondary | ICD-10-CM

## 2014-01-10 MED ORDER — PROCHLORPERAZINE 25 MG RE SUPP: 25.0000 mg | Freq: Two times a day (BID) | RECTAL | Status: DC | PRN

## 2014-01-10 NOTE — Patient Instructions (Addendum)
Per [name] stop your Aricept/donepezil for a week.  Call and report to [place] what this does to you so we can decide plans for you.   We have sent the following medications to your pharmacy for you to pick up at your convenience: Rectal suppositories   Follow up with [place] in 4-6 weeks, appointment made for 02/18/14 at 2:15pm.    I appreciate the opportunity to care for you.

## 2014-01-10 NOTE — Progress Notes (Signed)
   Subjective:    Patient ID: Bryan Becker, male    DOB: 03-13-1961, 53 y.o.   MRN: [id number]  HPI  Bryan Becker is here after I saw him for abdominal pain and diarrhea last year - had EGD/colon in 01/2013 - findings were ulcer in terinal ileum, o/w negative. Tried holding Victoza to see if related to pain. That did not help. Lost insurance and now has Medicaid so returning. He may have had a stroke - he  got TPA in [place] 11/14 for LUE weakness but in end no clear proof of stroke. Back in 11/2013 with RUE weakness and thought functional and was to have outpatient psych eval. Has been to [facility] also.  Complains of:  Diarrhea all the time Is on Aricept fo short-term mem loss 1 year  N/V q 7-10 d in ED w/u neg multiple recent visits  BS poorly controlled  Lab Results  Component Value Date   HGBA1C 9.1* 12/05/2013    Medications, allergies, past medical history, past surgical history, family history and social history are reviewed and updated in the EMR.  Review of Systems As above    Objective:   Physical Exam General:  NAD, obese Eyes:   anicteric Abdomen:  soft and mildly tender epigastrium, BS+ Ext:   no edema A and O x 3    Data Reviewed:  As perHPI (hospital notes [facility] and outpatient notes [facility])      Assessment & Plan:  Diarrhea  Intractable vomiting with nausea, vomiting of unspecified type - Plan: prochlorperazine (COMPAZINE) 25 MG suppository  Diabetes mellitus type II, uncontrolled  Ulcer of intestine  Plans:   1) Hold Aricept x 1 week to see what happens with diarrhea 2) Endocrinology  Referral for DM out of control 3) Compazine suppository prn N/V 4) Consider trial  Xifaxan 5) Had doubted Crohn's but may need more evaluation for this given prior  ulcer in ileum  Call back and plan next steps  discussed Reglan use hold off given diarrhea   VK:[id number],PYYFRT Jerilynn Mages, MD

## 2014-01-12 ENCOUNTER — Encounter: Payer: Self-pay | Admitting: Internal Medicine

## 2014-01-12 DIAGNOSIS — R197 Diarrhea, unspecified: Secondary | ICD-10-CM | POA: Insufficient documentation

## 2014-01-12 DIAGNOSIS — IMO0002 Reserved for concepts with insufficient information to code with codable children: Secondary | ICD-10-CM | POA: Insufficient documentation

## 2014-01-12 DIAGNOSIS — K633 Ulcer of intestine: Secondary | ICD-10-CM | POA: Insufficient documentation

## 2014-01-12 DIAGNOSIS — E1165 Type 2 diabetes mellitus with hyperglycemia: Secondary | ICD-10-CM | POA: Insufficient documentation

## 2014-01-12 DIAGNOSIS — R112 Nausea with vomiting, unspecified: Secondary | ICD-10-CM | POA: Insufficient documentation

## 2014-01-21 ENCOUNTER — Telehealth: Payer: Self-pay | Admitting: Internal Medicine

## 2014-01-21 NOTE — Telephone Encounter (Signed)
Patient notified He is with his primary care now.  They are discussing an alternative now He will follow up as needed

## 2014-01-21 NOTE — Telephone Encounter (Signed)
Diarrhea has resolved off of aricept.  Please advise

## 2014-01-21 NOTE — Telephone Encounter (Signed)
Stop Aricept and list diarrhea as side effect in allergies  Discus alternative Tx w/ neuro when he sees them  F/u me prn

## 2014-02-05 ENCOUNTER — Encounter (HOSPITAL_BASED_OUTPATIENT_CLINIC_OR_DEPARTMENT_OTHER): Payer: Self-pay | Admitting: Emergency Medicine

## 2014-02-05 ENCOUNTER — Emergency Department (HOSPITAL_BASED_OUTPATIENT_CLINIC_OR_DEPARTMENT_OTHER): Payer: Medicaid Other

## 2014-02-05 ENCOUNTER — Emergency Department (HOSPITAL_BASED_OUTPATIENT_CLINIC_OR_DEPARTMENT_OTHER)
Admission: EM | Admit: 2014-02-05 | Discharge: 2014-02-05 | Disposition: A | Discharge status: Home / Self Care | Payer: Medicaid Other | Attending: Emergency Medicine | Admitting: Emergency Medicine

## 2014-02-05 ENCOUNTER — Emergency Department (HOSPITAL_COMMUNITY): Payer: Medicaid Other

## 2014-02-05 DIAGNOSIS — E119 Type 2 diabetes mellitus without complications: Secondary | ICD-10-CM | POA: Insufficient documentation

## 2014-02-05 DIAGNOSIS — Z8673 Personal history of transient ischemic attack (TIA), and cerebral infarction without residual deficits: Secondary | ICD-10-CM | POA: Insufficient documentation

## 2014-02-05 DIAGNOSIS — R29818 Other symptoms and signs involving the nervous system: Secondary | ICD-10-CM | POA: Diagnosis present

## 2014-02-05 DIAGNOSIS — Z79899 Other long term (current) drug therapy: Secondary | ICD-10-CM | POA: Insufficient documentation

## 2014-02-05 DIAGNOSIS — M6281 Muscle weakness (generalized): Secondary | ICD-10-CM

## 2014-02-05 DIAGNOSIS — R531 Weakness: Secondary | ICD-10-CM

## 2014-02-05 DIAGNOSIS — I10 Essential (primary) hypertension: Secondary | ICD-10-CM | POA: Insufficient documentation

## 2014-02-05 DIAGNOSIS — R197 Diarrhea, unspecified: Secondary | ICD-10-CM

## 2014-02-05 DIAGNOSIS — R112 Nausea with vomiting, unspecified: Secondary | ICD-10-CM

## 2014-02-05 DIAGNOSIS — G43909 Migraine, unspecified, not intractable, without status migrainosus: Secondary | ICD-10-CM | POA: Diagnosis not present

## 2014-02-05 DIAGNOSIS — I509 Heart failure, unspecified: Secondary | ICD-10-CM | POA: Insufficient documentation

## 2014-02-05 DIAGNOSIS — G43109 Migraine with aura, not intractable, without status migrainosus: Secondary | ICD-10-CM

## 2014-02-05 LAB — DIFFERENTIAL
BASOS ABS: 0 10*3/uL (ref 0.0–0.1)
Basophils Relative: 0 % (ref 0–1)
Eosinophils Absolute: 0.1 10*3/uL (ref 0.0–0.7)
Eosinophils Relative: 2 % (ref 0–5)
LYMPHS ABS: 2 10*3/uL (ref 0.7–4.0)
LYMPHS PCT: 44 % (ref 12–46)
MONO ABS: 0.4 10*3/uL (ref 0.1–1.0)
Monocytes Relative: 9 % (ref 3–12)
Neutro Abs: 2 10*3/uL (ref 1.7–7.7)
Neutrophils Relative %: 45 % (ref 43–77)

## 2014-02-05 LAB — CBC
HEMATOCRIT: 40.9 % (ref 39.0–52.0)
HEMOGLOBIN: 14.2 g/dL (ref 13.0–17.0)
MCH: 30 pg (ref 26.0–34.0)
MCHC: 34.7 g/dL (ref 30.0–36.0)
MCV: 86.3 fL (ref 78.0–100.0)
Platelets: 172 10*3/uL (ref 150–400)
RBC: 4.74 MIL/uL (ref 4.22–5.81)
RDW: 12.9 % (ref 11.5–15.5)
WBC: 4.6 10*3/uL (ref 4.0–10.5)

## 2014-02-05 LAB — URINALYSIS, ROUTINE W REFLEX MICROSCOPIC
Bilirubin Urine: NEGATIVE
Glucose, UA: 250 mg/dL — AB
Hgb urine dipstick: NEGATIVE
Ketones, ur: NEGATIVE mg/dL
LEUKOCYTES UA: NEGATIVE
Nitrite: NEGATIVE
Protein, ur: NEGATIVE mg/dL
SPECIFIC GRAVITY, URINE: 1.014 (ref 1.005–1.030)
UROBILINOGEN UA: 0.2 mg/dL (ref 0.0–1.0)
pH: 6 (ref 5.0–8.0)

## 2014-02-05 LAB — COMPREHENSIVE METABOLIC PANEL
ALT: 24 U/L (ref 0–53)
AST: 18 U/L (ref 0–37)
Albumin: 4.2 g/dL (ref 3.5–5.2)
Alkaline Phosphatase: 52 U/L (ref 39–117)
Anion gap: 13 (ref 5–15)
BUN: 9 mg/dL (ref 6–23)
CHLORIDE: 101 meq/L (ref 96–112)
CO2: 27 meq/L (ref 19–32)
CREATININE: 0.6 mg/dL (ref 0.50–1.35)
Calcium: 9.5 mg/dL (ref 8.4–10.5)
GFR calc Af Amer: >90 mL/min (ref >90)
GFR calc non Af Amer: >90 mL/min (ref >90)
Glucose, Bld: 210 mg/dL — ABNORMAL HIGH (ref 70–99)
Potassium: 3.8 mEq/L (ref 3.7–5.3)
Sodium: 141 mEq/L (ref 137–147)
Total Protein: 7.2 g/dL (ref 6.0–8.3)

## 2014-02-05 LAB — CBG MONITORING, ED
GLUCOSE-CAPILLARY: 185 mg/dL — AB (ref 70–99)
Glucose-Capillary: 153 mg/dL — ABNORMAL HIGH (ref 70–99)

## 2014-02-05 LAB — RAPID URINE DRUG SCREEN, HOSP PERFORMED
Amphetamines: NOT DETECTED
Barbiturates: NOT DETECTED
Benzodiazepines: NOT DETECTED
Cocaine: NOT DETECTED
Opiates: POSITIVE — AB
TETRAHYDROCANNABINOL: NOT DETECTED

## 2014-02-05 LAB — APTT: aPTT: 24 seconds (ref 24–37)

## 2014-02-05 LAB — TROPONIN I: Troponin I: <0.3 ng/mL (ref <0.30)

## 2014-02-05 LAB — ETHANOL: Alcohol, Ethyl (B): <11 mg/dL (ref 0–11)

## 2014-02-05 LAB — PROTIME-INR
INR: 1 (ref 0.00–1.49)
Prothrombin Time: 13.2 seconds (ref 11.6–15.2)

## 2014-02-05 MED ORDER — SODIUM CHLORIDE 0.9 % IV BOLUS (SEPSIS)
1000.0000 mL | Freq: Once | INTRAVENOUS | Status: AC
  Administered 2014-02-05: 1000 mL via INTRAVENOUS

## 2014-02-05 MED ORDER — PROCHLORPERAZINE EDISYLATE 5 MG/ML IJ SOLN
10.0000 mg | Freq: Four times a day (QID) | INTRAMUSCULAR | Status: DC | PRN
  Administered 2014-02-05: 10 mg via INTRAVENOUS
  Filled 2014-02-05: qty 2

## 2014-02-05 MED ORDER — DIPHENHYDRAMINE HCL 50 MG/ML IJ SOLN
12.5000 mg | Freq: Once | INTRAMUSCULAR | Status: AC
  Administered 2014-02-05: 12.5 mg via INTRAVENOUS
  Filled 2014-02-05: qty 1

## 2014-02-05 NOTE — ED Provider Notes (Addendum)
CSN: [id number]     Arrival date & time 02/05/14  1753 History  This chart was scribed for Bryan Essex, MD by Einar Pheasant, ED Scribe. This patient was seen in room MH07/MH07 and the patient's care was started at 6:18 PM.    Chief Complaint  Patient presents with  . Emesis   The history is provided by the patient. No language interpreter was used.   HPI Comments: Bryan Becker is a 53 y.o. male who presents to the Emergency Department complaining of a couple of episodes of emesis that started 2 days ago. Pt states that this is a recurrent problem first started 3 weeks ago and that he is working with his PCP to resolve the symptom. With this current episode he reports about 7-8 episodes of emesis, with last episode 2 hours ago. He also endorses associated leg cramping, 2 episodes of loose bowel (black in nature and bad odor, hesistancy with urination, and sore throat. Pt states that in the past he feels better after being given IV fluids. Pt has a hx of ulcers and Cirrhosis. Pt is also complaining of left side weakness, which he states that is similar to a previous stroke episode. He states that the weakness started approximately 30 minutes ago. Pt also complaining of left sided chest pain that radiates up to his jaw. Pt states that the associated symptoms from his stroke had resolved and states that the weakness and chest pain are all new for him, which started 30 minutes ago. Denies any fever, chills, SOB.  Pt had a similar presentation in July, but at the time it was weakness to his right side. He received clot buster in December 2014 for R sided weakness.  Past Medical History  Diagnosis Date  . Hypertension   . Pancreatitis   . CHF (congestive heart failure)   . 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 Seasonal allergies   . Asthma   . Anxiety   . COPD (chronic obstructive pulmonary disease)   . Hyperlipidemia   . Gastric ulcer   . Fibromyalgia   . Kidney stone   .  Myocardial infarction 1995  . Type II diabetes mellitus   . Pneumonia     "several times"  . Chronic bronchitis     "get it q yr"  . OSA (obstructive sleep apnea) 04/24/2013    "use BiPAP"  . GERD (gastroesophageal reflux disease)   . OZDGUYQI(347.4)     "weekly" (12/04/2013)  . Arthritis     "joints"  . Chronic back pain     "mostly lower; have 2 bulging discs in my neck" (12/05/2013)  . Stroke 03/2013    "still weak on the left side" (12/05/2013)  . Stroke 12/04/2013    "came in today w/right sided weakness" (12/04/2013)  . Dementia     onset of early  . 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 Cholecystectomy    . Multiple cysts removal-hip,wrist    . Upper gastrointestinal endoscopy    . Colonoscopy    . Tonsillectomy  ~ 1985  . Cardiac catheterization    . Cystoscopy w/ stone manipulation     Family History  Problem Relation Age of Onset  . Colon cancer Neg Hx   . Esophageal cancer Neg Hx   . Rectal cancer Neg Hx   . Skin cancer Father   . Lung cancer Paternal Grandfather   .  Stomach cancer Maternal Grandfather   . Bone cancer Paternal Grandmother   . Diabetes Mother   . Diabetes Maternal Grandmother   . Heart disease Maternal Grandmother   . Heart attack Father   . Heart disease Paternal Grandfather   . Heart attack Paternal Grandmother   . Hypertension Mother   . Arthritis Mother   . Other Mother     brain stem vertigo  . Other Mother     disorder of pancreas  . Stomach cancer Father   . Hypertension Father    History  Substance Use Topics  . Smoking status: Former Smoker -- 2.00 packs/day for 21 years    Types: Cigarettes    Quit date: 05/23/2002  . Smokeless tobacco: Never Used  . Alcohol Use: No    Review of Systems A complete 10 system review of systems was obtained and all systems are negative except as noted in the HPI and PMH. .  Allergies  Aricept and Codeine  Home Medications    Prior to Admission medications   Medication Sig Start Date End Date Taking? Authorizing Provider  Memantine HCl (NAMENDA PO) Take by mouth.   Yes Historical Provider, MD  albuterol (PROVENTIL HFA;VENTOLIN HFA) 108 (90 BASE) MCG/ACT inhaler Inhale 2 puffs into the lungs every 6 (six) hours as needed. For shortness of breath     Historical Provider, MD  citalopram (CELEXA) 40 MG tablet Take 40 mg by mouth daily.      Historical Provider, MD  clopidogrel (PLAVIX) 75 MG tablet Take 75 mg by mouth daily.    Historical Provider, MD  cyanocobalamin (,VITAMIN B-12,) 1000 MCG/ML injection Inject 1,000 mcg into the muscle once a week.     Historical Provider, MD  dicyclomine (BENTYL) 10 MG capsule Take 1 capsule (10 mg total) by mouth 3 (three) times daily before meals. 01/03/14   Merryl Hacker, MD  donepezil (ARICEPT) 10 MG tablet Take 10 mg by mouth at bedtime.    Historical Provider, MD  fluticasone (FLONASE) 50 MCG/ACT nasal spray Place 2 sprays into both nostrils daily.    Historical Provider, MD  furosemide (LASIX) 40 MG tablet Take 1 tablet (40 mg total) by mouth daily. 12/05/13   Geradine Girt, DO  gabapentin (NEURONTIN) 600 MG tablet Take 1,200 mg by mouth 3 (three) times daily.    Historical Provider, MD  glimepiride (AMARYL) 4 MG tablet Take 2 mg by mouth 2 (two) times daily.     Historical Provider, MD  HYDROcodone-acetaminophen (NORCO) 10-325 MG per tablet Take 1 tablet by mouth every 6 (six) hours.    Historical Provider, MD  insulin detemir (LEVEMIR) 100 UNIT/ML injection Inject 60 Units into the skin at bedtime.     Historical Provider, MD  insulin lispro protamine-lispro (HUMALOG 50/50 MIX) (50-50) 100 UNIT/ML SUSP injection Inject into the skin 2 (two) times daily before a meal.    Historical Provider, MD  Liraglutide (VICTOZA) 18 MG/3ML SOLN Inject 1.8 mg into the skin daily.     Historical Provider, MD  lisinopril (PRINIVIL,ZESTRIL) 40 MG tablet Take 40 mg by mouth daily.      Historical Provider, MD  LORazepam (ATIVAN) 1 MG tablet Take 1 mg by mouth 2 (two) times daily.      Historical Provider, MD  omeprazole (PRILOSEC) 20 MG capsule Take 20 mg by mouth at bedtime.     Historical Provider, MD  ondansetron (ZOFRAN) 4 MG tablet Take 1 tablet (4 mg total) by mouth every  8 (eight) hours as needed for nausea or vomiting. 12/05/13   Geradine Girt, DO  Potassium Gluconate 595 MG CAPS Take 595 mg by mouth daily.     Historical Provider, MD  prochlorperazine (COMPAZINE) 25 MG suppository Place 1 suppository (25 mg total) rectally every 12 (twelve) hours as needed for nausea or vomiting. 01/10/14   Gatha Mayer, MD  tiotropium (SPIRIVA) 18 MCG inhalation capsule Place 1 capsule (18 mcg total) into inhaler and inhale daily as needed (wheezing). 12/05/13   Geradine Girt, DO  traMADol (ULTRAM) 50 MG tablet Take 50 mg by mouth 2 (two) times daily.    Historical Provider, MD   Triage vitals:BP 156/79  Pulse 89  Temp(Src) 98.1 F (36.7 C) (Oral)  Resp 20  Ht 5' 9"  (1.753 m)  Wt 279 lb (126.554 kg)  BMI 41.18 kg/m2  SpO2 98%  Physical Exam  Nursing note and vitals reviewed. Constitutional: He is oriented to person, place, and time. He appears well-developed and well-nourished. No distress.  HENT:  Head: Normocephalic and atraumatic.  Mouth/Throat: Oropharynx is clear and moist. Mucous membranes are dry. No oropharyngeal exudate.  Eyes: Conjunctivae and EOM are normal. Pupils are equal, round, and reactive to light.  Neck: Normal range of motion. Neck supple.  No meningismus.  Cardiovascular: Normal rate, regular rhythm, normal heart sounds and intact distal pulses.   No murmur heard. Pulmonary/Chest: Effort normal and breath sounds normal. No respiratory distress.  Abdominal: Soft. Bowel sounds are normal. There is no tenderness. There is no rebound and no guarding.  Diffuse abdominal tenderness. No guarding. No rebound.   Musculoskeletal: Normal range of motion. He  exhibits no edema and no tenderness.  Neurological: He is alert and oriented to person, place, and time. No cranial nerve deficit. He exhibits normal muscle tone. Coordination normal.  Pronator drift on the left. 5/5 strength on the right. CN 2-12 intact.  Unable to test finger to nose on the left, but it normal on the right. Sensation intact. 3/5 left upper and left lower extremity strength; unable to hold off bed but lifts arm when IV is strarted  Skin: Skin is warm.  Psychiatric: He has a normal mood and affect. His behavior is normal.    ED Course  Procedures   DIAGNOSTIC STUDIES: Oxygen Saturation is 98% on RA, normal by my interpretation.    COORDINATION OF CARE: 6:45 PM- Pt will be transferred to [facility] ED.   CRITICAL CARE Performed by: Bryan Essex, MD Total critical care time: 30 min Critical care time was exclusive of separately billable procedures and treating other patients. Critical care was necessary to treat or prevent imminent or life-threatening deterioration. Critical care was time spent personally by me on the following activities: development of treatment plan with patient and/or surrogate as well as nursing, discussions with consultants, evaluation of patient's response to treatment, examination of patient, obtaining history from patient or surrogate, ordering and performing treatments and interventions, ordering and review of laboratory studies, ordering and review of radiographic studies, pulse oximetry and re-evaluation of patient's condition.  Medications  sodium chloride 0.9 % bolus 1,000 mL (1,000 mLs Intravenous New Bag/Given 02/05/14 1852)    Labs Review Labs Reviewed  CBG MONITORING, ED - Abnormal; Notable for the following:    Glucose-Capillary 185 (*)    All other components within normal limits  CBC  DIFFERENTIAL  ETHANOL  PROTIME-INR  APTT  COMPREHENSIVE METABOLIC PANEL  URINE RAPID DRUG SCREEN (HOSP PERFORMED)  URINALYSIS, ROUTINE W  REFLEX MICROSCOPIC  TROPONIN I    Imaging Review No results found.   EKG Interpretation   Date/Time:  Wednesday February 05 2014 18:10:14 EDT Ventricular Rate:  83 PR Interval:  128 QRS Duration: 88 QT Interval:  374 QTC Calculation: 439 R Axis:   57 Text Interpretation:  Normal sinus rhythm Normal ECG No significant change  was found Confirmed by Wyvonnia Dusky  MD, Forbes Loll [phone]) on 02/05/2014 6:53:02  PM      MDM   Final diagnoses:  Left-sided weakness  Nausea vomiting and diarrhea   patient presents with 2 today history of nausea, vomiting, abdominal pain, diarrhea. History of similar symptoms in the past. He states that for the past 30 minutes his body on the left side feels like it is "drawing up". He feels weakness in his left arm and left leg. His last seen normal was at 6 PM. He also complains of pain in the L side of his chest radiating to his jaw that onset 30 minutes ago as well. EKG nsr.   Patient has a history of previous stroke And received TPA in 2014. He had a code stroke called in July 2015 with right sided weakness which was thought to be functional.  Code stroke activated. Discussed with Dr. Nicole Kindred who will see patient [facility]. He agrees to withhold TPA at this time as patient's deficits are likely functional and there is still time in the TPA window. CT head negative. D/w [facility] at [facility] for Dr. Audie Pinto.  I personally performed the services described in this documentation, which was scribed in my presence. The recorded information has been reviewed and is accurate.    Bryan Essex, MD 02/05/14 [place], MD 02/06/14 [phone]

## 2014-02-05 NOTE — ED Notes (Signed)
Vomiting x 2 days with leg cramps-hx of same with PCP involvement to change meds

## 2014-02-05 NOTE — ED Notes (Signed)
Mri complete.  Iv in the  [place]mar surface of his rt wrist  Became disconnected and was bleeding all over the stretcher.  Still c/o weakness and  The headache

## 2014-02-05 NOTE — ED Notes (Signed)
Now states CP with left jaw pain x 30 min

## 2014-02-05 NOTE — ED Notes (Signed)
The pt arrived by [facility] from [facility] code stroke called for the pt there.  C/o lt sided weakness soon after arrival there.  See their notes/.  He has a headache.  Hx of the same.  Stroke team met on arrival and then he was taken to the mri at [phone]

## 2014-02-05 NOTE — ED Notes (Addendum)
Verbal order by dr. Wyvonnia Dusky to call code stroke. Pt reports symptoms started "at 6pm". Ct notified of orders. Dr. Wyvonnia Dusky remains at bedside for continued assesment

## 2014-02-05 NOTE — ED Notes (Signed)
Code stroke cancelled by the stroke team.  See paper docementation

## 2014-02-05 NOTE — Discharge Instructions (Signed)

## 2014-02-05 NOTE — ED Notes (Signed)
Med given iv for his headache

## 2014-02-05 NOTE — ED Notes (Signed)
Reported to [place] RN pt is still pending UDS and swallow screen

## 2014-02-05 NOTE — ED Provider Notes (Signed)
Transferred from [facility] for evaluation of headache and left-sided weakness.  Patient was transferred for an MRI scan.  Was seen by neurology who thinks that his symptoms are secondary to a complicated migraine.  After medications patient said he felt much much better and he wanted to go home.  Medications  prochlorperazine (COMPAZINE) injection 10 mg (10 mg Intravenous Given 02/05/14 2115)  sodium chloride 0.9 % bolus 1,000 mL (0 mLs Intravenous Stopped 02/05/14 2156)  diphenhydrAMINE (BENADRYL) injection 12.5 mg (12.5 mg Intravenous Given 02/05/14 2112)     Dot Lanes, MD 02/05/14 2233

## 2014-02-05 NOTE — Consult Note (Signed)
Neurology Consultation Reason for Consult: Left-sided numbness and weakness Referring Physician: Audie Pinto , R  CC: Left-sided numbness and weakness  History is obtained from: Patient  HPI: Bryan Becker is a 53 y.o. male with a history of at least 2 episodes of hemiparesis without clear MRI changes who presented to [facility] earlier this evening for nausea and inability to keep things down. He was afraid he was getting dehydrated and therefore presented to the ER. Once they are, he developed left-sided weakness, but denied numbness. Because of this, he was taken for a stat CT of his head and then code stroke was called in his transported to the [facility]  for further evaluation.  After arrival here, was noted that he had marked give way weakness of the left side. An MRI was still done to rule out acute infarct, and his diffsion was negative for acute infarction. He endorses photophobic headache is bifrontal in nature.   LKW: 6 PM tpa given?: no, not stroke    ROS: A 14 point ROS was performed and is negative except as noted in the HPI.   Past Medical History  Diagnosis Date  . Hypertension   . Pancreatitis   . CHF (congestive heart failure)   . 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 Seasonal allergies   . Asthma   . Anxiety   . COPD (chronic obstructive pulmonary disease)   . Hyperlipidemia   . Gastric ulcer   . Fibromyalgia   . Kidney stone   . Myocardial infarction 1995  . Type II diabetes mellitus   . Pneumonia     "several times"  . Chronic bronchitis     "get it q yr"  . OSA (obstructive sleep apnea) 04/24/2013    "use BiPAP"  . GERD (gastroesophageal reflux disease)   . URKYHCWC(376.2)     "weekly" (12/04/2013)  . Arthritis     "joints"  . Chronic back pain     "mostly lower; have 2 bulging discs in my neck" (12/05/2013)  . Stroke 03/2013    "still weak on the left side" (12/05/2013)  . Stroke 12/04/2013    "came in today w/right  sided weakness" (12/04/2013)  . Dementia     onset of early  . 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/22/2012    Family History: No history of migraines  Social History: Tob: Denies  Exam: Current vital signs: BP 124/67  Pulse 78  Temp(Src) 97.5 F (36.4 C) (Oral)  Resp 18  Ht 5' 9"  (1.753 m)  Wt 126.554 kg (279 lb)  BMI 41.18 kg/m2  SpO2 93% Vital signs in last 24 hours: Temp:  [97.5 F (36.4 C)-98.4 F (36.9 C)] 97.5 F (36.4 C) (09/16 2134) Pulse Rate:  [78-91] 78 (09/16 2155) Resp:  [16-20] 18 (09/16 2155) BP: (124-156)/(67-79) 124/67 mmHg (09/16 2155) SpO2:  [93 %-98 %] 93 % (09/16 2155) Weight:  [126.554 kg (279 lb)] 126.554 kg (279 lb) (09/16 1756)  General: In bed, NAD CV: Regular rate and rhythm Mental Status: Patient is awake, alert, oriented to person, place, age, gives month as august.  Immediate and remote memory are intact. Patient is able to give a clear and coherent history. No signs of aphasia or neglect Cranial Nerves: II: Visual Fields are full. Pupils are equal, round, and reactive to light.  Discs are difficult to visualize. III,IV, VI: EOMI without ptosis or diploplia.  V: Facial sensation is symmetric to temperature VII: Facial  movement is symmetric.  VIII: hearing is intact to voice X: Uvula elevates symmetrically XI: Shoulder shrug is symmetric. XII: tongue is midline without atrophy or fasciculations.  Motor: Tone is normal. Bulk is normal. 5/5 strength was present on the right. He was given initial full-strength, and then suddenly give way in both the left arm and leg. He would hold the arm without drift for 2 seconds and then suddenly let it fall to the bed. Sensory: Sensation is diminished on the left to pinprick compared to the right Deep Tendon Reflexes: 2+ and symmetric in the biceps and patellae.  Cerebellar: FNF  intact bilaterally. Of note, when performing finger-nose-finger with his left arm he does not let a fall to the ground  as he did when assessing strength. Gait: Not assessed due to acute nature of evaluation and multiple medical monitors in ED setting.    I have reviewed labs in epic and the results pertinent to this consultation are: CMP-unremarkable  I have reviewed the images obtained: MRI brain-limited with only diffusion sequence obtained, negative  Impression: 53 year old male with likely functional left-sided weakness given the inconsistent exam and give way character. Given the photophobic headache, difficult to rule out possibility of complicated migraine, and could consider treating for this.  Recommendations: 1) consider Compazine plus Benadryl/fluids for possible migraine 2) no further neurological evaluation needed at this time. Neurology will sign off. Please call with any further questions or concerns.   Roland Rack, MD [facility] [phone]  If 7pm- 7am, please page neurology on call as listed in [place].

## 2014-02-05 NOTE — ED Notes (Signed)
[facility] at bedside preparing to transport.

## 2014-02-11 ENCOUNTER — Emergency Department (HOSPITAL_BASED_OUTPATIENT_CLINIC_OR_DEPARTMENT_OTHER)
Admission: EM | Admit: 2014-02-11 | Discharge: 2014-02-11 | Disposition: A | Discharge status: Home / Self Care | Payer: Medicaid Other | Attending: Emergency Medicine | Admitting: Emergency Medicine

## 2014-02-11 ENCOUNTER — Encounter (HOSPITAL_BASED_OUTPATIENT_CLINIC_OR_DEPARTMENT_OTHER): Payer: Self-pay | Admitting: Emergency Medicine

## 2014-02-11 ENCOUNTER — Emergency Department (HOSPITAL_BASED_OUTPATIENT_CLINIC_OR_DEPARTMENT_OTHER): Payer: Medicaid Other

## 2014-02-11 DIAGNOSIS — IMO0002 Reserved for concepts with insufficient information to code with codable children: Secondary | ICD-10-CM | POA: Insufficient documentation

## 2014-02-11 DIAGNOSIS — I509 Heart failure, unspecified: Secondary | ICD-10-CM | POA: Diagnosis not present

## 2014-02-11 DIAGNOSIS — J4489 Other specified chronic obstructive pulmonary disease: Secondary | ICD-10-CM | POA: Insufficient documentation

## 2014-02-11 DIAGNOSIS — Z79899 Other long term (current) drug therapy: Secondary | ICD-10-CM | POA: Insufficient documentation

## 2014-02-11 DIAGNOSIS — K219 Gastro-esophageal reflux disease without esophagitis: Secondary | ICD-10-CM | POA: Insufficient documentation

## 2014-02-11 DIAGNOSIS — Z87442 Personal history of urinary calculi: Secondary | ICD-10-CM | POA: Diagnosis not present

## 2014-02-11 DIAGNOSIS — Z7902 Long term (current) use of antithrombotics/antiplatelets: Secondary | ICD-10-CM | POA: Insufficient documentation

## 2014-02-11 DIAGNOSIS — Z8739 Personal history of other diseases of the musculoskeletal system and connective tissue: Secondary | ICD-10-CM | POA: Insufficient documentation

## 2014-02-11 DIAGNOSIS — Z9981 Dependence on supplemental oxygen: Secondary | ICD-10-CM | POA: Diagnosis not present

## 2014-02-11 DIAGNOSIS — Z8701 Personal history of pneumonia (recurrent): Secondary | ICD-10-CM | POA: Insufficient documentation

## 2014-02-11 DIAGNOSIS — F411 Generalized anxiety disorder: Secondary | ICD-10-CM | POA: Insufficient documentation

## 2014-02-11 DIAGNOSIS — R112 Nausea with vomiting, unspecified: Secondary | ICD-10-CM | POA: Insufficient documentation

## 2014-02-11 DIAGNOSIS — G4733 Obstructive sleep apnea (adult) (pediatric): Secondary | ICD-10-CM | POA: Diagnosis not present

## 2014-02-11 DIAGNOSIS — Z794 Long term (current) use of insulin: Secondary | ICD-10-CM | POA: Diagnosis not present

## 2014-02-11 DIAGNOSIS — J449 Chronic obstructive pulmonary disease, unspecified: Secondary | ICD-10-CM | POA: Diagnosis not present

## 2014-02-11 DIAGNOSIS — G8929 Other chronic pain: Secondary | ICD-10-CM | POA: Insufficient documentation

## 2014-02-11 DIAGNOSIS — I10 Essential (primary) hypertension: Secondary | ICD-10-CM | POA: Insufficient documentation

## 2014-02-11 DIAGNOSIS — I252 Old myocardial infarction: Secondary | ICD-10-CM | POA: Diagnosis not present

## 2014-02-11 DIAGNOSIS — R1084 Generalized abdominal pain: Secondary | ICD-10-CM | POA: Insufficient documentation

## 2014-02-11 DIAGNOSIS — Z87891 Personal history of nicotine dependence: Secondary | ICD-10-CM | POA: Diagnosis not present

## 2014-02-11 LAB — CBC WITH DIFFERENTIAL/PLATELET
BASOS ABS: 0 10*3/uL (ref 0.0–0.1)
BASOS PCT: 1 % (ref 0–1)
EOS ABS: 0.1 10*3/uL (ref 0.0–0.7)
EOS PCT: 2 % (ref 0–5)
HEMATOCRIT: 40.8 % (ref 39.0–52.0)
Hemoglobin: 14 g/dL (ref 13.0–17.0)
Lymphocytes Relative: 31 % (ref 12–46)
Lymphs Abs: 1.5 10*3/uL (ref 0.7–4.0)
MCH: 29.9 pg (ref 26.0–34.0)
MCHC: 34.3 g/dL (ref 30.0–36.0)
MCV: 87.2 fL (ref 78.0–100.0)
MONO ABS: 0.4 10*3/uL (ref 0.1–1.0)
Monocytes Relative: 8 % (ref 3–12)
Neutro Abs: 2.8 10*3/uL (ref 1.7–7.7)
Neutrophils Relative %: 58 % (ref 43–77)
Platelets: 194 10*3/uL (ref 150–400)
RBC: 4.68 MIL/uL (ref 4.22–5.81)
RDW: 13.1 % (ref 11.5–15.5)
WBC: 4.9 10*3/uL (ref 4.0–10.5)

## 2014-02-11 LAB — URINALYSIS, ROUTINE W REFLEX MICROSCOPIC
BILIRUBIN URINE: NEGATIVE
Glucose, UA: 100 mg/dL — AB
Hgb urine dipstick: NEGATIVE
Ketones, ur: NEGATIVE mg/dL
Leukocytes, UA: NEGATIVE
Nitrite: NEGATIVE
Protein, ur: NEGATIVE mg/dL
Specific Gravity, Urine: 1.014 (ref 1.005–1.030)
UROBILINOGEN UA: 0.2 mg/dL (ref 0.0–1.0)
pH: 8 (ref 5.0–8.0)

## 2014-02-11 LAB — COMPREHENSIVE METABOLIC PANEL
ALT: 27 U/L (ref 0–53)
AST: 22 U/L (ref 0–37)
Albumin: 4 g/dL (ref 3.5–5.2)
Alkaline Phosphatase: 48 U/L (ref 39–117)
Anion gap: 13 (ref 5–15)
BUN: 13 mg/dL (ref 6–23)
CALCIUM: 9.4 mg/dL (ref 8.4–10.5)
CO2: 26 mEq/L (ref 19–32)
CREATININE: 0.5 mg/dL (ref 0.50–1.35)
Chloride: 103 mEq/L (ref 96–112)
GFR calc Af Amer: >90 mL/min (ref >90)
Glucose, Bld: 189 mg/dL — ABNORMAL HIGH (ref 70–99)
Potassium: 4.1 mEq/L (ref 3.7–5.3)
Sodium: 142 mEq/L (ref 137–147)
TOTAL PROTEIN: 7.1 g/dL (ref 6.0–8.3)
Total Bilirubin: 0.3 mg/dL (ref 0.3–1.2)

## 2014-02-11 LAB — LIPASE, BLOOD: LIPASE: 42 U/L (ref 11–59)

## 2014-02-11 LAB — TROPONIN I

## 2014-02-11 LAB — CBG MONITORING, ED: Glucose-Capillary: 157 mg/dL — ABNORMAL HIGH (ref 70–99)

## 2014-02-11 MED ORDER — PROMETHAZINE HCL 25 MG PO TABS: 25.0000 mg | ORAL_TABLET | Freq: Four times a day (QID) | ORAL | Status: DC | PRN

## 2014-02-11 MED ORDER — SODIUM CHLORIDE 0.9 % IV BOLUS (SEPSIS): 1000.0000 mL | Freq: Once | INTRAVENOUS | Status: DC

## 2014-02-11 MED ORDER — ONDANSETRON 4 MG PO TBDP: 4.0000 mg | ORAL_TABLET | Freq: Three times a day (TID) | ORAL | Status: DC | PRN

## 2014-02-11 MED ORDER — SODIUM CHLORIDE 0.9 % IV BOLUS (SEPSIS)
1000.0000 mL | Freq: Once | INTRAVENOUS | Status: AC
  Administered 2014-02-11: 1000 mL via INTRAVENOUS

## 2014-02-11 MED ORDER — HYDROMORPHONE HCL 1 MG/ML IJ SOLN
0.5000 mg | Freq: Once | INTRAMUSCULAR | Status: AC
  Administered 2014-02-11: 0.5 mg via INTRAVENOUS
  Filled 2014-02-11: qty 1

## 2014-02-11 MED ORDER — ONDANSETRON HCL 4 MG/2ML IJ SOLN
4.0000 mg | Freq: Once | INTRAMUSCULAR | Status: AC
  Administered 2014-02-11: 4 mg via INTRAVENOUS
  Filled 2014-02-11: qty 2

## 2014-02-11 MED ORDER — KETOROLAC TROMETHAMINE 30 MG/ML IJ SOLN
30.0000 mg | Freq: Once | INTRAMUSCULAR | Status: AC
  Administered 2014-02-11: 30 mg via INTRAVENOUS
  Filled 2014-02-11: qty 1

## 2014-02-11 NOTE — Discharge Instructions (Signed)
Abdominal Pain Many things can cause abdominal pain. Usually, abdominal pain is not caused by a disease and will improve without treatment. It can often be observed and treated at home. Your health care provider will do a physical exam and possibly order blood tests and X-rays to help determine the seriousness of your pain. However, in many cases, more time must pass before a clear cause of the pain can be found. Before that point, your health care provider may not know if you need more testing or further treatment. HOME CARE INSTRUCTIONS  Monitor your abdominal pain for any changes. The following actions may help to alleviate any discomfort you are experiencing:  Only take over-the-counter or prescription medicines as directed by your health care provider.  Do not take laxatives unless directed to do so by your health care provider.  Try a clear liquid diet (broth, tea, or water) as directed by your health care provider. Slowly move to a bland diet as tolerated. SEEK MEDICAL CARE IF:  You have unexplained abdominal pain.  You have abdominal pain associated with nausea or diarrhea.  You have pain when you urinate or have a bowel movement.  You experience abdominal pain that wakes you in the night.  You have abdominal pain that is worsened or improved by eating food.  You have abdominal pain that is worsened with eating fatty foods.  You have a fever. SEEK IMMEDIATE MEDICAL CARE IF:   Your pain does not go away within 2 hours.  You keep throwing up (vomiting).  Your pain is felt only in portions of the abdomen, such as the right side or the left lower portion of the abdomen.  You pass bloody or black tarry stools. MAKE SURE YOU:  Understand these instructions.   Will watch your condition.   Will get help right away if you are not doing well or get worse.  Document Released: 02/16/2005 Document Revised: 05/14/2013 Document Reviewed: 01/16/2013 [facility] Patient Information  2015 [place], [place]. This information is not intended to replace advice given to you by your health care provider. Make sure you discuss any questions you have with your health care provider.  Nausea and Vomiting Nausea is a sick feeling that often comes before throwing up (vomiting). Vomiting is a reflex where stomach contents come out of your mouth. Vomiting can cause severe loss of body fluids (dehydration). Children and elderly adults can become dehydrated quickly, especially if they also have diarrhea. Nausea and vomiting are symptoms of a condition or disease. It is important to find the cause of your symptoms. CAUSES   Direct irritation of the stomach lining. This irritation can result from increased acid production (gastroesophageal reflux disease), infection, food poisoning, taking certain medicines (such as nonsteroidal anti-inflammatory drugs), alcohol use, or tobacco use.  Signals from the brain.These signals could be caused by a headache, heat exposure, an inner ear disturbance, increased pressure in the brain from injury, infection, a tumor, or a concussion, pain, emotional stimulus, or metabolic problems.  An obstruction in the gastrointestinal tract (bowel obstruction).  Illnesses such as diabetes, hepatitis, gallbladder problems, appendicitis, kidney problems, cancer, sepsis, atypical symptoms of a heart attack, or eating disorders.  Medical treatments such as chemotherapy and radiation.  Receiving medicine that makes you sleep (general anesthetic) during surgery. DIAGNOSIS Your caregiver may ask for tests to be done if the problems do not improve after a few days. Tests may also be done if symptoms are severe or if the reason for the nausea  and vomiting is not clear. Tests may include:  Urine tests.  Blood tests.  Stool tests.  Cultures (to look for evidence of infection).  X-rays or other imaging studies. Test results can help your caregiver make decisions about  treatment or the need for additional tests. TREATMENT You need to stay well hydrated. Drink frequently but in small amounts.You may wish to drink water, sports drinks, clear broth, or eat frozen ice pops or gelatin dessert to help stay hydrated.When you eat, eating slowly may help prevent nausea.There are also some antinausea medicines that may help prevent nausea. HOME CARE INSTRUCTIONS   Take all medicine as directed by your caregiver.  If you do not have an appetite, do not force yourself to eat. However, you must continue to drink fluids.  If you have an appetite, eat a normal diet unless your caregiver tells you differently.  Eat a variety of complex carbohydrates (rice, wheat, potatoes, bread), lean meats, yogurt, fruits, and vegetables.  Avoid high-fat foods because they are more difficult to digest.  Drink enough water and fluids to keep your urine clear or pale yellow.  If you are dehydrated, ask your caregiver for specific rehydration instructions. Signs of dehydration may include:  Severe thirst.  Dry lips and mouth.  Dizziness.  Dark urine.  Decreasing urine frequency and amount.  Confusion.  Rapid breathing or pulse. SEEK IMMEDIATE MEDICAL CARE IF:   You have blood or brown flecks (like coffee grounds) in your vomit.  You have black or bloody stools.  You have a severe headache or stiff neck.  You are confused.  You have severe abdominal pain.  You have chest pain or trouble breathing.  You do not urinate at least once every 8 hours.  You develop cold or clammy skin.  You continue to vomit for longer than 24 to 48 hours.  You have a fever. MAKE SURE YOU:   Understand these instructions.  Will watch your condition.  Will get help right away if you are not doing well or get worse. Document Released: 05/09/2005 Document Revised: 08/01/2011 Document Reviewed: 10/06/2010 [facility] Patient Information 2015 [place], [place]. This information is not  intended to replace advice given to you by your health care provider. Make sure you discuss any questions you have with your health care provider.

## 2014-02-11 NOTE — ED Notes (Signed)
Pt given sprite zero

## 2014-02-11 NOTE — ED Notes (Signed)
Pt reports n/v since Sunday- emesis x 3 today

## 2014-02-11 NOTE — ED Provider Notes (Signed)
TIME SEEN: 12:41 PM  CHIEF COMPLAINT: Abdominal pain, nausea, vomiting, shortness of breath  HPI: Patient is a 53 y.o. M with history of morbid obesity, hypertension, insulin-dependent diabetes, CAD, CHF, CVA, NASH, COPD, prior episode of pancreatitis, gastric ulcer who presents to the emergency department with complaints of nausea, vomiting and diffuse upper abdominal pain. He reports that he has had the similar symptoms several times in the past. He reports that he has had intermittent episodes of cyclic vomiting for the past 3-4 months. He states that he will come to the ER, give IV fluids and antiemetics and feel better for several days and then have to come back. He does now have a gastroenterologist, [name] he saw 3 weeks ago. He has another appointment next week. States that he will sometimes have diarrhea but none today. His episode of nausea and vomiting started 2 days ago. He denies any tinnitus discomfort. He has had some shortness of breath and states he feels that this may be his COPD "acting out" because he was burning brush in his backyard yesterday. Denies any cough. No fevers or chills. No bloody stool or melena. No dysuria or hematuria. He is status post cholecystectomy and abdominal surgery that he reports was do to "scar tissue" around his stomach.  ROS: See HPI Constitutional: no fever  Eyes: no drainage  ENT: no runny nose   Cardiovascular:  no chest pain  Resp:  SOB  GI:  vomiting GU: no dysuria Integumentary: no rash  Allergy: no hives  Musculoskeletal: no leg swelling  Neurological: no slurred speech ROS otherwise negative  PAST MEDICAL HISTORY/PAST SURGICAL HISTORY:  Past Medical History  Diagnosis Date  . Hypertension   . Pancreatitis   . CHF (congestive heart failure)   . 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 Seasonal allergies   . Asthma   . Anxiety   . COPD (chronic obstructive pulmonary disease)   . Hyperlipidemia   . Gastric ulcer    . Fibromyalgia   . Kidney stone   . Myocardial infarction 1995  . Type II diabetes mellitus   . Pneumonia     "several times"  . Chronic bronchitis     "get it q yr"  . OSA (obstructive sleep apnea) 04/24/2013    "use BiPAP"  . GERD (gastroesophageal reflux disease)   . LKTGYBWL(893.7)     "weekly" (12/04/2013)  . Arthritis     "joints"  . Chronic back pain     "mostly lower; have 2 bulging discs in my neck" (12/05/2013)  . Stroke 03/2013    "still weak on the left side" (12/05/2013)  . Stroke 12/04/2013    "came in today w/right sided weakness" (12/04/2013)  . Dementia     onset of early  . [facility] spotted fever) 11/22/2012    MEDICATIONS:  Prior to Admission medications   Medication Sig Start Date End Date Taking? Authorizing Provider  albuterol (PROVENTIL HFA;VENTOLIN HFA) 108 (90 BASE) MCG/ACT inhaler Inhale 2 puffs into the lungs every 6 (six) hours as needed. For shortness of breath     Historical Provider, MD  citalopram (CELEXA) 40 MG tablet Take 40 mg by mouth daily.      Historical Provider, MD  clopidogrel (PLAVIX) 75 MG tablet Take 75 mg by mouth daily.    Historical Provider, MD  cyanocobalamin (,VITAMIN B-12,) 1000 MCG/ML injection Inject 1,000 mcg into the muscle once a week.     Historical Provider,  MD  dicyclomine (BENTYL) 10 MG capsule Take 1 capsule (10 mg total) by mouth 3 (three) times daily before meals. 01/03/14   Merryl Hacker, MD  donepezil (ARICEPT) 10 MG tablet Take 10 mg by mouth at bedtime.    Historical Provider, MD  fluticasone (FLONASE) 50 MCG/ACT nasal spray Place 2 sprays into both nostrils daily.    Historical Provider, MD  furosemide (LASIX) 40 MG tablet Take 1 tablet (40 mg total) by mouth daily. 12/05/13   Geradine Girt, DO  gabapentin (NEURONTIN) 600 MG tablet Take 1,200 mg by mouth 3 (three) times daily.    Historical Provider, MD  glimepiride (AMARYL) 4 MG tablet Take 2 mg by mouth 2 (two) times daily.     Historical Provider,  MD  HYDROcodone-acetaminophen (NORCO) 10-325 MG per tablet Take 1 tablet by mouth every 6 (six) hours.    Historical Provider, MD  insulin detemir (LEVEMIR) 100 UNIT/ML injection Inject 60 Units into the skin at bedtime.     Historical Provider, MD  insulin lispro protamine-lispro (HUMALOG 50/50 MIX) (50-50) 100 UNIT/ML SUSP injection Inject into the skin 2 (two) times daily before a meal.    Historical Provider, MD  Liraglutide (VICTOZA) 18 MG/3ML SOLN Inject 1.8 mg into the skin daily.     Historical Provider, MD  lisinopril (PRINIVIL,ZESTRIL) 40 MG tablet Take 40 mg by mouth daily.     Historical Provider, MD  LORazepam (ATIVAN) 1 MG tablet Take 1 mg by mouth 2 (two) times daily.      Historical Provider, MD  Memantine HCl (NAMENDA PO) Take by mouth.    Historical Provider, MD  omeprazole (PRILOSEC) 20 MG capsule Take 20 mg by mouth at bedtime.     Historical Provider, MD  ondansetron (ZOFRAN) 4 MG tablet Take 1 tablet (4 mg total) by mouth every 8 (eight) hours as needed for nausea or vomiting. 12/05/13   Geradine Girt, DO  Potassium Gluconate 595 MG CAPS Take 595 mg by mouth daily.     Historical Provider, MD  prochlorperazine (COMPAZINE) 25 MG suppository Place 1 suppository (25 mg total) rectally every 12 (twelve) hours as needed for nausea or vomiting. 01/10/14   Gatha Mayer, MD  tiotropium (SPIRIVA) 18 MCG inhalation capsule Place 1 capsule (18 mcg total) into inhaler and inhale daily as needed (wheezing). 12/05/13   Geradine Girt, DO  traMADol (ULTRAM) 50 MG tablet Take 50 mg by mouth 2 (two) times daily.    Historical Provider, MD    ALLERGIES:  Allergies  Allergen Reactions  . Aricept [Donepezil Hcl] Diarrhea  . Codeine Other (See Comments)    Made skin peel    SOCIAL HISTORY:  History  Substance Use Topics  . Smoking status: Former Smoker -- 2.00 packs/day for 21 years    Types: Cigarettes    Quit date: 05/23/2002  . Smokeless tobacco: Never Used  . Alcohol Use: No     FAMILY HISTORY: Family History  Problem Relation Age of Onset  . Colon cancer Neg Hx   . Esophageal cancer Neg Hx   . Rectal cancer Neg Hx   . Skin cancer Father   . Lung cancer Paternal Grandfather   . Stomach cancer Maternal Grandfather   . Bone cancer Paternal Grandmother   . Diabetes Mother   . Diabetes Maternal Grandmother   . Heart disease Maternal Grandmother   . Heart attack Father   . Heart disease Paternal Grandfather   . Heart attack  Paternal Grandmother   . Hypertension Mother   . Arthritis Mother   . Other Mother     brain stem vertigo  . Other Mother     disorder of pancreas  . Stomach cancer Father   . Hypertension Father     EXAM: BP 155/84  Pulse 81  Temp(Src) 99 F (37.2 C) (Oral)  Resp 20  Ht 5' 9"  (1.753 m)  Wt 275 lb (124.739 kg)  BMI 40.59 kg/m2  SpO2 100% CONSTITUTIONAL: Alert and oriented and responds appropriately to questions. Well-appearing; well-nourished, pleasant, in no distress, nontoxic HEAD: Normocephalic EYES: Conjunctivae clear, PERRL ENT: normal nose; no rhinorrhea; moist mucous membranes; pharynx without lesions noted NECK: Supple, no meningismus, no LAD  CARD: RRR; S1 and S2 appreciated; no murmurs, no clicks, no rubs, no gallops RESP: Normal chest excursion without splinting or tachypnea; breath sounds clear and equal bilaterally; no wheezes, no rhonchi, no rales,  ABD/GI: Normal bowel sounds; non-distended; soft, diffusely tender to palpation without guarding or rebound, no tenderness at McBurney's point, no peritoneal signs, obese BACK:  The back appears normal and is non-tender to palpation, there is no CVA tenderness EXT: Normal ROM in all joints; non-tender to palpation; no edema; normal capillary refill; no cyanosis    SKIN: Normal color for age and race; warm NEURO: Moves all extremities equally PSYCH: The patient's mood and manner are appropriate. Grooming and personal hygiene are appropriate.  MEDICAL DECISION  MAKING: Patient here with nausea, vomiting abdominal pain that he has had many times in the past. He reports he last had an endoscopy and colonoscopy in November 2014. This was prior to his symptoms starting. He has an appointment with a GI specialist next week. He states he has tried Phenergan and Zofran at home without relief. He states he has had CT scans that have been unremarkable. He reports that normally he feels better after IV fluids and antiemetics in the ED. he is today saying that he is feeling short of breath and he feels that this may be a COPD exacerbation. His lungs are clear to auscultation with good air movement. We'll obtain EKG, troponin and chest x-ray to evaluate for possible etiology for his shortness of breath. Will obtain abdominal labs, urine. Will give Zofran, Toradol and IV fluids. He denies wanting any narcotics during this visit - states they make him feel "loopy".  ED PROGRESS: Patient's labs are unremarkable. No leukocytosis. Electrolytes within normal limits. LFTs and lipase normal. Troponin negative. Acute abdominal series is negative. EKG shows no ischemic changes. Reports that he has had some improvement in nausea with Zofran but no improvement of pain with Toradol. He now agrees to IV narcotics for pain control. Urinalysis pending. We'll by mouth challenge.   2:59 PM  Pt's urine shows no sign of infection or ketones. He has been able to tolerate by mouth in the ED. He reports he has an appointment with gastroenterology in one week. I feel that he is safe to be discharged home. He agrees with this plan. We'll refill his Zofran and Phenergan. I do not feel he needs to be discharged home on narcotic pain medication at this time. Discussed supportive care instructions and return precautions. He verbalizes understanding and is comfortable with this plan.   EKG Interpretation  Date/Time:  Tuesday February 11 2014 12:47:51 EDT Ventricular Rate:  75 PR Interval:  124 QRS  Duration: 86 QT Interval:  392 QTC Calculation: 437 R Axis:   66 Text Interpretation:  Normal sinus rhythm Normal ECG Confirmed by Eames Dibiasio,  DO, [place] [phone]) on 02/11/2014 12:53:49 PM        [place], DO 02/11/14 1500

## 2014-02-18 ENCOUNTER — Encounter: Payer: Self-pay | Admitting: Internal Medicine

## 2014-02-18 ENCOUNTER — Ambulatory Visit (INDEPENDENT_AMBULATORY_CARE_PROVIDER_SITE_OTHER): Payer: Medicaid Other | Admitting: Internal Medicine

## 2014-02-18 VITALS — BP 140/80 | HR 80 | Ht 68.0 in | Wt 278.0 lb

## 2014-02-18 DIAGNOSIS — K633 Ulcer of intestine: Secondary | ICD-10-CM

## 2014-02-18 DIAGNOSIS — R51 Headache: Secondary | ICD-10-CM

## 2014-02-18 DIAGNOSIS — IMO0001 Reserved for inherently not codable concepts without codable children: Secondary | ICD-10-CM

## 2014-02-18 DIAGNOSIS — R1110 Vomiting, unspecified: Secondary | ICD-10-CM

## 2014-02-18 DIAGNOSIS — R1084 Generalized abdominal pain: Secondary | ICD-10-CM | POA: Insufficient documentation

## 2014-02-18 DIAGNOSIS — IMO0002 Reserved for concepts with insufficient information to code with codable children: Secondary | ICD-10-CM

## 2014-02-18 DIAGNOSIS — E1165 Type 2 diabetes mellitus with hyperglycemia: Secondary | ICD-10-CM

## 2014-02-18 DIAGNOSIS — R1115 Cyclical vomiting syndrome unrelated to migraine: Secondary | ICD-10-CM

## 2014-02-18 NOTE — Assessment & Plan Note (Signed)
He had one small ulcer on his terminal ileum findings at colonoscopy last year. I am considering this nonspecific and not entertaining further possibility of inflammatory bowel disease until his diabetes mellitus is under better control as I think it highly unlikely that he has IBD.

## 2014-02-18 NOTE — Patient Instructions (Addendum)
We have made you an appointment with Dr. Philemon Kingdom with [facility] for October 13th at 8:15AM, arrive at 8:00AM.  They will mail information to you with date/time/location.  Follow up with [place] as needed.   I appreciate the opportunity to care for you.

## 2014-02-18 NOTE — Progress Notes (Signed)
   Subjective:    Patient ID: Bryan Becker, male    DOB: [birth year], 53 y.o.   MRN: [id number]  HPI The patient returns. Diarrhea stopped after I held his donepezil so we stopped that. Have problems with intermittent nausea and vomiting again and feeling weak and he was having a stroke. End up [facility] where an MRI showed no acute abnormality neurology thought he was having functional weakness issues. I thought he might be having some type of migraine component. [facility] and they thought something similar in may want him to taper off gabapentin and start defects or but he is waiting on Medicaid approval to do that. He remains on 20+ medications. His blood sugar was 290+ his primary care office yesterday and his last hemoglobin A1c in July was 9%.  He is chronically nauseated and sore in his abdomen. He had an endoscopic workup last year. He claims he does not eat much in the way of sweets et Ronney Asters.   Medications, allergies, past medical history, past surgical history, family history and social history are reviewed and updated in the EMR.  Review of Systems As above, also chronic aches and pains throughout.    Objective:   Physical Exam  General:  Well-developed, well-nourished and in no acute distress, he is obese Eyes:  anicteric. Lungs: Clear to auscultation bilaterally. Heart:  S1S2, no rubs, murmurs, gallops. Abdomen:  soft, obese, diffusely tender which is worse with muscle tension, no hepatosplenomegaly, hernia, or mass and BS+.  Extremities:   no edema Skin   no rash. Neuro:  A&O x 3.  Psych:  appropriate mood and  Affect.   Data Reviewed:  Extensive [facility] records reviewed including imaging, neurology consult, ER notes, labs, prior GI notes, I have also reviewed records from [facility] neurology clinic for multiple visits.     Assessment & Plan:  Diabetes mellitus type II, uncontrolled Still his # 1 problem Endocrinology referral at this  point I think is in his best interest and I will make that. He should feel better overall of his blood sugars are controlled I can't guarantee that is best shot.  Nausea with vomiting Probably a combination of diabetes out of control/gastroapresis, medication and possible migraine Advised he use prochlorperazine suppository if starts to vomit, hold lisinopril if vomits uncontrollably Also hold furosemide he developed intractable vomiting.  Endocrine referral will be made. Document polypharmacy would be helpful if we can do that.  Ulcer of intestine He had one small ulcer on his terminal ileum findings at colonoscopy last year. I am considering this nonspecific and not entertaining further possibility of inflammatory bowel disease until his diabetes mellitus is under better control as I think it highly unlikely that he has IBD.  Abdominal pain, generalized I suspect most of this is from his intermittent nausea and vomiting his abdominal wall pain.  VZDGLOVF(643.3) He certainly may have a component of migraine-type problems. Heart is sore all of this out I do not think his primary disturbance is gastrointestinal in origin. He should follow through with his [facility] neurology recommendations, hopefully the Effexor will be improved. I would consider duloxetine perhaps although that does not headache indication that could help his overall autonomic and pain issues are related to diabetes.   CC: Harvie Junior, MD

## 2014-02-18 NOTE — Assessment & Plan Note (Addendum)
Probably a combination of diabetes out of control/gastroapresis, medication and possible migraine Advised he use prochlorperazine suppository if starts to vomit, hold lisinopril if vomits uncontrollably Also hold furosemide he developed intractable vomiting.  Endocrine referral will be made. Document polypharmacy would be helpful if we can do that.

## 2014-02-18 NOTE — Assessment & Plan Note (Addendum)
Still his # 1 problem Endocrinology referral at this point I think is in his best interest and I will make that. He should feel better overall of his blood sugars are controlled I can't guarantee that is best shot.

## 2014-02-18 NOTE — Assessment & Plan Note (Signed)
He certainly may have a component of migraine-type problems. Heart is sore all of this out I do not think his primary disturbance is gastrointestinal in origin. He should follow through with his [facility] neurology recommendations, hopefully the Effexor will be improved. I would consider duloxetine perhaps although that does not headache indication that could help his overall autonomic and pain issues are related to diabetes.

## 2014-02-18 NOTE — Assessment & Plan Note (Signed)
I suspect most of this is from his intermittent nausea and vomiting his abdominal wall pain.

## 2014-03-04 ENCOUNTER — Encounter: Payer: Self-pay | Admitting: Internal Medicine

## 2014-03-04 ENCOUNTER — Ambulatory Visit (INDEPENDENT_AMBULATORY_CARE_PROVIDER_SITE_OTHER): Payer: Medicaid Other | Admitting: Internal Medicine

## 2014-03-04 VITALS — BP 124/74 | HR 88 | Temp 98.0°F | Resp 16 | Ht 68.0 in | Wt 282.6 lb

## 2014-03-04 DIAGNOSIS — E1165 Type 2 diabetes mellitus with hyperglycemia: Secondary | ICD-10-CM

## 2014-03-04 DIAGNOSIS — IMO0002 Reserved for concepts with insufficient information to code with codable children: Secondary | ICD-10-CM

## 2014-03-04 NOTE — Progress Notes (Signed)
Patient ID: Bryan Becker, male   DOB: July 25, 1960, 53 y.o.   MRN: [id number]  HPI: Bryan Becker is a 53 y.o.-year-old male, referred by Dr Carlean Purl (GI), for management of DM2, insulin-dependent, uncontrolled, with complications (cerebro-vascular ds - h/o stroke/TIA in 03/2013, PN).  Patient has been diagnosed with diabetes in 2010; he started insulin in 2012.   Last hemoglobin A1c was: Lab Results  Component Value Date   HGBA1C 9.1* 12/05/2013   HGBA1C 9.7* 04/22/2013   HGBA1C 10.0* 04/21/2013   Pt is on a regimen of: - Amaryl 2 mg po bid - Levemir 60 units qhs - Humalog BID - only SSI: target 200; +10 (200-250), + 15 (250-300) - Victoza 1.8 mg daily in am Tried metformin 2/2 diarrhea.  He has a memory pb >> cannot remember sometimes if he took his meds or not  Pt checks his sugars 1-3x a day and they are: - am: 250-400 [ yesterday up to 500 (took 10 units) >> 355 (took 10 more units) >> 250} - 2h after b'fast: n/c - before lunch: 200-300 - 2h after lunch: n/c - before dinner: 250s - 2h after dinner: n/c - bedtime: 250s - nighttime: no lows No lows. Lowest sugar was 250; he has hypoglycemia awareness at 100.  Highest sugar was HI.  Pt's meals are: - Breakfast: V8, 2% milk, banana - Lunch: meat + veggies - Dinner: meat + veggies; occasional fast food - Snacks: no sweets; crackers, PB, cheese, sugar-free jello; has a snack at bedtime (PB crackers + milk) Lemon water, V8  - no CKD, last BUN/creatinine:  Lab Results  Component Value Date   BUN 13 02/11/2014   CREATININE 0.50 02/11/2014  On Lisinopril. - last set of lipids: Lab Results  Component Value Date   CHOL 168 12/05/2013   HDL 21* 12/05/2013   LDLCALC UNABLE TO CALCULATE IF TRIGLYCERIDE OVER 400 mg/dL 12/05/2013   TRIG 597* 12/05/2013   CHOLHDL 8.0 12/05/2013  Not on a statin. - last eye exam was in 2014. ? DR.  - + numbness and tingling in his feet. On Gabapentin  Pt has FH of DM in M,  MGM.  ROS: Constitutional: no weight gain/loss, + fatigue, no subjective hyperthermia/hypothermia, + increased urination Eyes: + blurry vision, no xerophthalmia ENT: no sore throat, no nodules palpated in throat, no dysphagia/odynophagia, no hoarseness Cardiovascular: + all: CP/SOB/palpitations/leg swelling Respiratory: no cough/+ SOB Gastrointestinal: +all: N/V/D/C/acid reflux Musculoskeletal: no muscle/joint aches Skin: no rashes Neurological: no tremors/numbness/tingling/dizziness, + HA Psychiatric: + both: depression/anxiety + low libido, diff with erections  Past Medical History  Diagnosis Date  . Hypertension   . Pancreatitis   . CHF (congestive heart failure)   . 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 Seasonal allergies   . Asthma   . Anxiety   . COPD (chronic obstructive pulmonary disease)   . Hyperlipidemia   . Gastric ulcer   . Fibromyalgia   . Kidney stone   . Myocardial infarction 1995  . Type II diabetes mellitus   . Pneumonia     "several times"  . Chronic bronchitis     "get it q yr"  . OSA (obstructive sleep apnea) 04/24/2013    "use BiPAP"  . GERD (gastroesophageal reflux disease)   . HYQMVHQI(696.2)     "weekly" (12/04/2013)  . Arthritis     "joints"  . Chronic back pain     "mostly lower; have 2 bulging discs in my neck" (12/05/2013)  .  Stroke 03/2013    "still weak on the left side" (12/05/2013)  . Stroke 12/04/2013    "came in today w/right sided weakness" (12/04/2013)  . Dementia     onset of early  . [facility] spotted fever) 11/22/2012   Past Surgical History  Procedure Laterality Date  . Cholecystectomy    . Multiple cysts removal-hip,wrist    . Upper gastrointestinal endoscopy    . Colonoscopy    . Tonsillectomy  ~ 1985  . Cardiac catheterization    . Cystoscopy w/ stone manipulation     History   Social History  . Marital Status: Married    Spouse Name: N/A    Number of Children: 2   Occupational History   . unemployed    Social History Main Topics  . Smoking status: Former Smoker -- 2.00 packs/day for 21 years    Types: Cigarettes    Quit date: 05/24/2003  . Smokeless tobacco: Never Used  . Alcohol Use: No  . Drug Use: No   Current Outpatient Prescriptions on File Prior to Visit  Medication Sig Dispense Refill  . albuterol (PROVENTIL HFA;VENTOLIN HFA) 108 (90 BASE) MCG/ACT inhaler Inhale 2 puffs into the lungs every 6 (six) hours as needed. For shortness of breath       . citalopram (CELEXA) 40 MG tablet Take 40 mg by mouth daily.        . clopidogrel (PLAVIX) 75 MG tablet Take 75 mg by mouth daily.      . cyanocobalamin (,VITAMIN B-12,) 1000 MCG/ML injection Inject 1,000 mcg into the muscle once a week.       . fluticasone (FLONASE) 50 MCG/ACT nasal spray Place 2 sprays into both nostrils daily.      . furosemide (LASIX) 40 MG tablet Take 1 tablet (40 mg total) by mouth daily.  30 tablet  0  . gabapentin (NEURONTIN) 600 MG tablet Take 1,200 mg by mouth 3 (three) times daily.      Marland Kitchen glimepiride (AMARYL) 4 MG tablet Take 2 mg by mouth 2 (two) times daily.       Marland Kitchen HYDROcodone-acetaminophen (NORCO) 10-325 MG per tablet Take 1 tablet by mouth every 6 (six) hours.      . insulin detemir (LEVEMIR) 100 UNIT/ML injection Inject 60 Units into the skin at bedtime.       . insulin lispro protamine-lispro (HUMALOG 50/50 MIX) (50-50) 100 UNIT/ML SUSP injection Inject into the skin 2 (two) times daily before a meal.      . Liraglutide (VICTOZA) 18 MG/3ML SOLN Inject 1.8 mg into the skin daily.       Marland Kitchen lisinopril (PRINIVIL,ZESTRIL) 40 MG tablet Take 40 mg by mouth daily.       Marland Kitchen LORazepam (ATIVAN) 1 MG tablet Take 1 mg by mouth 2 (two) times daily.        . Memantine HCl (NAMENDA PO) Take 1 tablet by mouth at bedtime.       Marland Kitchen omeprazole (PRILOSEC) 20 MG capsule Take 20 mg by mouth at bedtime.       . ondansetron (ZOFRAN ODT) 4 MG disintegrating tablet Take 1 tablet (4 mg total) by mouth every 8 (eight)  hours as needed for nausea or vomiting.  20 tablet  0  . ondansetron (ZOFRAN) 4 MG tablet Take 1 tablet (4 mg total) by mouth every 8 (eight) hours as needed for nausea or vomiting.  20 tablet  0  . Potassium Gluconate 595 MG CAPS Take 595  mg by mouth daily.       . prochlorperazine (COMPAZINE) 25 MG suppository Place 1 suppository (25 mg total) rectally every 12 (twelve) hours as needed for nausea or vomiting.  12 suppository  0  . promethazine (PHENERGAN) 25 MG tablet Take 1 tablet (25 mg total) by mouth every 6 (six) hours as needed for nausea or vomiting.  15 tablet  0  . tiotropium (SPIRIVA) 18 MCG inhalation capsule Place 1 capsule (18 mcg total) into inhaler and inhale daily as needed (wheezing).  30 capsule  12  . traMADol (ULTRAM) 50 MG tablet Take 50 mg by mouth 2 (two) times daily.       No current facility-administered medications on file prior to visit.   Allergies  Allergen Reactions  . Aricept [Donepezil Hcl] Diarrhea  . Codeine Other (See Comments)    Made skin peel   Family History  Problem Relation Age of Onset  . Colon cancer Neg Hx   . Esophageal cancer Neg Hx   . Rectal cancer Neg Hx   . Skin cancer Father   . Lung cancer Paternal Grandfather   . Stomach cancer Maternal Grandfather   . Bone cancer Paternal Grandmother   . Diabetes Mother   . Diabetes Maternal Grandmother   . Heart disease Maternal Grandmother   . Heart attack Father   . Heart disease Paternal Grandfather   . Heart attack Paternal Grandmother   . Hypertension Mother   . Arthritis Mother   . Other Mother     brain stem vertigo  . Other Mother     disorder of pancreas  . Stomach cancer Father   . Hypertension Father    PE: BP 124/74  Pulse 88  Temp(Src) 98 F (36.7 C)  Resp 16  Ht 5' 8"  (1.727 m)  Wt 282 lb 9.6 oz (128.187 kg)  BMI 42.98 kg/m2  SpO2 96% Wt Readings from Last 3 Encounters:  03/04/14 282 lb 9.6 oz (128.187 kg)  02/18/14 278 lb (126.1 kg)  02/11/14 275 lb (124.739  kg)   Constitutional: obese, in NAD Eyes: PERRLA, EOMI, no exophthalmos ENT: moist mucous membranes, no thyromegaly, no cervical lymphadenopathy Cardiovascular: RRR, No MRG Respiratory: CTA B Gastrointestinal: abdomen soft, NT, ND, BS+ Musculoskeletal: no deformities, strength intact in all 4 Skin: moist, warm, no rashes Neurological: no tremor with outstretched hands, DTR normal in all 4  ASSESSMENT: 1. DM2, insulin-dependent, uncontrolled, with complications - cerebro-vascular ds - h/o stroke/TIA in 03/2013 - PN  PLAN:  1. Patient with long-standing, uncontrolled diabetes, on oral antidiabetic regimen + basal insulin and SSI Humalog, which became insufficient - We discussed about options for treatment, and I suggested to start mealtime insulin, stop Amaryl (as we are adding mealtime insulin), and also stop Victoza (as he describes frequent nausea episodes). We may nbe able to add Invokana as a means to reduce insulin requirements in the future. Patient Instructions  Please stop: - Amaryl  - Victoza  Continue: - Levemir 60 units at bedtime Start mealtime Humalog: - 10 units with a small meal - 15 units for a regular meal - 20 units for a large meal Start the following Humalog sliding scale: - 150- 165: + 1 unit  - 166- 180: + 2 units  - 181- 195: + 3 units  - 196- 210: + 4 units  - 211- 225: + 5 units  - 226- 240: + 6 units  - 241- 255: + 7 units  - 256- 270: +  8 units  - 271- 285: + 9 units  - > 286: + 10 units Please come back for a follow-up appointment in 1 month. Please bring your sugar log. - Strongly advised him to start checking sugars at different times of the day - check 3 times a day, rotating checks - given sugar log and advised how to fill it and to bring it at next appt  - given foot care handout and explained the principles  - given instructions for hypoglycemia management "15-15 rule"  - advised for yearly eye exams - Return to clinic in 1 mo with sugar  log - will check HbA1c then

## 2014-03-04 NOTE — Patient Instructions (Signed)
Please stop: - Amaryl  - Victoza  Continue: - Levemir 60 units at bedtime Start mealtime Humalog: - 10 units with a small meal - 15 units for a regular meal - 20 units for a large meal Start the following Humalog sliding scale: - 150- 165: + 1 unit  - 166- 180: + 2 units  - 181- 195: + 3 units  - 196- 210: + 4 units  - 211- 225: + 5 units  - 226- 240: + 6 units  - 241- 255: + 7 units  - 256- 270: + 8 units  - 271- 285: + 9 units  - > 286: + 10 units Please come back for a follow-up appointment in 1 month. Please bring your sugar log.  PATIENT INSTRUCTIONS FOR TYPE 2 DIABETES:  **Please join MyChart!** - see attached instructions about how to join if you have not done so already.  DIET AND EXERCISE Diet and exercise is an important part of diabetic treatment.  We recommended aerobic exercise in the form of brisk walking (working between 40-60% of maximal aerobic capacity, similar to brisk walking) for 150 minutes per week (such as 30 minutes five days per week) along with 3 times per week performing 'resistance' training (using various gauge rubber tubes with handles) 5-10 exercises involving the major muscle groups (upper body, lower body and core) performing 10-15 repetitions (or near fatigue) each exercise. Start at half the above goal but build slowly to reach the above goals. If limited by weight, joint pain, or disability, we recommend daily walking in a swimming pool with water up to waist to reduce pressure from joints while allow for adequate exercise.    BLOOD GLUCOSES Monitoring your blood glucoses is important for continued management of your diabetes. Please check your blood glucoses 2-4 times a day: fasting, before meals and at bedtime (you can rotate these measurements - e.g. one day check before the 3 meals, the next day check before 2 of the meals and before bedtime, etc.).   HYPOGLYCEMIA (low blood sugar) Hypoglycemia is usually a reaction to not eating, exercising,  or taking too much insulin/ other diabetes drugs.  Symptoms include tremors, sweating, hunger, confusion, headache, etc. Treat IMMEDIATELY with 15 grams of Carbs:   4 glucose tablets    cup regular juice/soda   2 tablespoons raisins   4 teaspoons sugar   1 tablespoon honey Recheck blood glucose in 15 mins and repeat above if still symptomatic/blood glucose <100.  RECOMMENDATIONS TO REDUCE YOUR RISK OF DIABETIC COMPLICATIONS: * Take your prescribed MEDICATION(S) * Follow a DIABETIC diet: Complex carbs, fiber rich foods, (monounsaturated and polyunsaturated) fats * AVOID saturated/trans fats, high fat foods, >2,300 mg salt per day. * EXERCISE at least 5 times a week for 30 minutes or preferably daily.  * DO NOT SMOKE OR DRINK more than 1 drink a day. * Check your FEET every day. Do not wear tightfitting shoes. Contact us if you develop an ulcer * See your EYE doctor once a year or more if needed * Get a FLU shot once a year * Get a PNEUMONIA vaccine once before and once after age 23 years  GOALS:  * Your Hemoglobin A1c of <7%  * fasting sugars need to be <130 * after meals sugars need to be <180 (2h after you start eating) * Your Systolic BP should be 789 or lower  * Your Diastolic BP should be 80 or lower  * Your HDL (Good Cholesterol) should be  59 or higher  * Your LDL (Bad Cholesterol) should be 100 or lower. * Your Triglycerides should be 150 or lower  * Your Urine microalbumin (kidney function) should be <30 * Your Body Mass Index should be 25 or lower    Please consider the following ways to cut down carbs and fat and increase fiber and micronutrients in your diet: - substitute whole grain for white bread or pasta - substitute brown rice for white rice - substitute 90-calorie flat bread pieces for slices of bread when possible - substitute sweet potatoes or yams for white potatoes - substitute humus for margarine - substitute tofu for cheese when possible - substitute  almond or rice milk for regular milk (would not drink soy milk daily due to concern for soy estrogen influence on breast cancer risk) - substitute dark chocolate for other sweets when possible - substitute water - can add lemon or orange slices for taste - for diet sodas (artificial sweeteners will trick your body that you can eat sweets without getting calories and will lead you to overeating and weight gain in the long run) - do not skip breakfast or other meals (this will slow down the metabolism and will result in more weight gain over time)  - can try smoothies made from fruit and almond/rice milk in am instead of regular breakfast - can also try old-fashioned (not instant) oatmeal made with almond/rice milk in am - order the dressing on the side when eating salad at a restaurant (pour less than half of the dressing on the salad) - eat as little meat as possible - can try juicing, but should not forget that juicing will get rid of the fiber, so would alternate with eating raw veg./fruits or drinking smoothies - use as little oil as possible, even when using olive oil - can dress a salad with a mix of balsamic vinegar and lemon juice, for e.g. - use agave nectar, stevia sugar, or regular sugar rather than artificial sweateners - steam or broil/roast veggies  - snack on veggies/fruit/nuts (unsalted, preferably) when possible, rather than processed foods - reduce or eliminate aspartame in diet (it is in diet sodas, chewing gum, etc) Read the labels!  Try to read Dr. Janene Harvey book: "Program for Reversing Diabetes" for other ideas for healthy eating.

## 2014-03-09 ENCOUNTER — Emergency Department (HOSPITAL_BASED_OUTPATIENT_CLINIC_OR_DEPARTMENT_OTHER)
Admission: EM | Admit: 2014-03-09 | Discharge: 2014-03-10 | Disposition: A | Discharge status: Home / Self Care | Payer: Medicaid Other | Attending: Emergency Medicine | Admitting: Emergency Medicine

## 2014-03-09 ENCOUNTER — Encounter (HOSPITAL_BASED_OUTPATIENT_CLINIC_OR_DEPARTMENT_OTHER): Payer: Self-pay | Admitting: Emergency Medicine

## 2014-03-09 DIAGNOSIS — I509 Heart failure, unspecified: Secondary | ICD-10-CM | POA: Diagnosis not present

## 2014-03-09 DIAGNOSIS — Z7901 Long term (current) use of anticoagulants: Secondary | ICD-10-CM | POA: Insufficient documentation

## 2014-03-09 DIAGNOSIS — I252 Old myocardial infarction: Secondary | ICD-10-CM | POA: Diagnosis not present

## 2014-03-09 DIAGNOSIS — Z79899 Other long term (current) drug therapy: Secondary | ICD-10-CM | POA: Insufficient documentation

## 2014-03-09 DIAGNOSIS — J449 Chronic obstructive pulmonary disease, unspecified: Secondary | ICD-10-CM | POA: Insufficient documentation

## 2014-03-09 DIAGNOSIS — K259 Gastric ulcer, unspecified as acute or chronic, without hemorrhage or perforation: Secondary | ICD-10-CM | POA: Diagnosis not present

## 2014-03-09 DIAGNOSIS — F0390 Unspecified dementia without behavioral disturbance: Secondary | ICD-10-CM | POA: Diagnosis not present

## 2014-03-09 DIAGNOSIS — Z8673 Personal history of transient ischemic attack (TIA), and cerebral infarction without residual deficits: Secondary | ICD-10-CM | POA: Diagnosis not present

## 2014-03-09 DIAGNOSIS — Z8701 Personal history of pneumonia (recurrent): Secondary | ICD-10-CM | POA: Diagnosis not present

## 2014-03-09 DIAGNOSIS — Z794 Long term (current) use of insulin: Secondary | ICD-10-CM | POA: Insufficient documentation

## 2014-03-09 DIAGNOSIS — E1169 Type 2 diabetes mellitus with other specified complication: Secondary | ICD-10-CM | POA: Insufficient documentation

## 2014-03-09 DIAGNOSIS — Z87442 Personal history of urinary calculi: Secondary | ICD-10-CM | POA: Diagnosis not present

## 2014-03-09 DIAGNOSIS — Z9089 Acquired absence of other organs: Secondary | ICD-10-CM | POA: Insufficient documentation

## 2014-03-09 DIAGNOSIS — E118 Type 2 diabetes mellitus with unspecified complications: Secondary | ICD-10-CM

## 2014-03-09 DIAGNOSIS — R1011 Right upper quadrant pain: Secondary | ICD-10-CM | POA: Insufficient documentation

## 2014-03-09 DIAGNOSIS — F419 Anxiety disorder, unspecified: Secondary | ICD-10-CM | POA: Insufficient documentation

## 2014-03-09 DIAGNOSIS — Z7951 Long term (current) use of inhaled steroids: Secondary | ICD-10-CM | POA: Diagnosis not present

## 2014-03-09 DIAGNOSIS — Z9889 Other specified postprocedural states: Secondary | ICD-10-CM | POA: Diagnosis not present

## 2014-03-09 DIAGNOSIS — E1165 Type 2 diabetes mellitus with hyperglycemia: Secondary | ICD-10-CM

## 2014-03-09 DIAGNOSIS — K219 Gastro-esophageal reflux disease without esophagitis: Secondary | ICD-10-CM | POA: Insufficient documentation

## 2014-03-09 DIAGNOSIS — G8929 Other chronic pain: Secondary | ICD-10-CM | POA: Insufficient documentation

## 2014-03-09 DIAGNOSIS — R1031 Right lower quadrant pain: Secondary | ICD-10-CM | POA: Diagnosis not present

## 2014-03-09 DIAGNOSIS — Z87891 Personal history of nicotine dependence: Secondary | ICD-10-CM | POA: Diagnosis not present

## 2014-03-09 DIAGNOSIS — R739 Hyperglycemia, unspecified: Secondary | ICD-10-CM | POA: Diagnosis present

## 2014-03-09 DIAGNOSIS — R109 Unspecified abdominal pain: Secondary | ICD-10-CM

## 2014-03-09 DIAGNOSIS — M1990 Unspecified osteoarthritis, unspecified site: Secondary | ICD-10-CM | POA: Diagnosis not present

## 2014-03-09 DIAGNOSIS — Z8619 Personal history of other infectious and parasitic diseases: Secondary | ICD-10-CM | POA: Diagnosis not present

## 2014-03-09 DIAGNOSIS — I10 Essential (primary) hypertension: Secondary | ICD-10-CM | POA: Diagnosis not present

## 2014-03-09 DIAGNOSIS — M797 Fibromyalgia: Secondary | ICD-10-CM | POA: Insufficient documentation

## 2014-03-09 DIAGNOSIS — IMO0002 Reserved for concepts with insufficient information to code with codable children: Secondary | ICD-10-CM

## 2014-03-09 LAB — CBC WITH DIFFERENTIAL/PLATELET
Basophils Absolute: 0.1 10*3/uL (ref 0.0–0.1)
Basophils Relative: 1 % (ref 0–1)
EOS ABS: 0.1 10*3/uL (ref 0.0–0.7)
Eosinophils Relative: 2 % (ref 0–5)
HEMATOCRIT: 41.1 % (ref 39.0–52.0)
HEMOGLOBIN: 14.3 g/dL (ref 13.0–17.0)
Lymphocytes Relative: 43 % (ref 12–46)
Lymphs Abs: 2.4 10*3/uL (ref 0.7–4.0)
MCH: 30.4 pg (ref 26.0–34.0)
MCHC: 34.8 g/dL (ref 30.0–36.0)
MCV: 87.3 fL (ref 78.0–100.0)
MONO ABS: 0.5 10*3/uL (ref 0.1–1.0)
MONOS PCT: 9 % (ref 3–12)
NEUTROS ABS: 2.5 10*3/uL (ref 1.7–7.7)
Neutrophils Relative %: 45 % (ref 43–77)
Platelets: 177 10*3/uL (ref 150–400)
RBC: 4.71 MIL/uL (ref 4.22–5.81)
RDW: 13 % (ref 11.5–15.5)
WBC: 5.5 10*3/uL (ref 4.0–10.5)

## 2014-03-09 LAB — URINALYSIS, ROUTINE W REFLEX MICROSCOPIC
BILIRUBIN URINE: NEGATIVE
Glucose, UA: >1000 mg/dL — AB
Hgb urine dipstick: NEGATIVE
Ketones, ur: NEGATIVE mg/dL
Leukocytes, UA: NEGATIVE
Nitrite: NEGATIVE
PROTEIN: NEGATIVE mg/dL
Specific Gravity, Urine: 1.038 — ABNORMAL HIGH (ref 1.005–1.030)
UROBILINOGEN UA: 0.2 mg/dL (ref 0.0–1.0)
pH: 5.5 (ref 5.0–8.0)

## 2014-03-09 LAB — URINE MICROSCOPIC-ADD ON

## 2014-03-09 LAB — CBG MONITORING, ED: Glucose-Capillary: 338 mg/dL — ABNORMAL HIGH (ref 70–99)

## 2014-03-09 MED ORDER — SODIUM CHLORIDE 0.9 % IV SOLN
INTRAVENOUS | Status: DC
  Administered 2014-03-09: 22:00:00 via INTRAVENOUS

## 2014-03-09 MED ORDER — INSULIN REGULAR HUMAN 100 UNIT/ML IJ SOLN
12.0000 [IU] | Freq: Once | INTRAMUSCULAR | Status: AC
  Administered 2014-03-10: 12 [IU] via SUBCUTANEOUS
  Filled 2014-03-09: qty 1

## 2014-03-09 MED ORDER — ONDANSETRON HCL 4 MG/2ML IJ SOLN
4.0000 mg | Freq: Once | INTRAMUSCULAR | Status: AC
  Administered 2014-03-09: 4 mg via INTRAVENOUS
  Filled 2014-03-09: qty 2

## 2014-03-09 NOTE — ED Notes (Signed)
I took CBG reading and got 338 mg. / dcltr.

## 2014-03-09 NOTE — ED Notes (Signed)
Pt reports hyperglycemia (>500) and diarrhea x 2-3 days.  Took 20units humalog at 7pm.

## 2014-03-09 NOTE — ED Provider Notes (Signed)
CSN: [id number]     Arrival date & time 03/09/14  1926 History  This chart was scribed for Charlesetta Shanks, MD by Peyton Bottoms, ED Scribe. This patient was seen in room MH07/MH07 and the patient's care was started at 9:10 PM.   Chief Complaint  Patient presents with  . Hyperglycemia   Patient is a 53 y.o. male presenting with hyperglycemia. The history is provided by the patient and a parent. No language interpreter was used.  Hyperglycemia  HPI Comments: Bryan Becker is a 53 y.o. male who presents to the Emergency Department complaining of hyperglycemia and generalized right sided abdominal pain in upper and lower quadrants. He states that his blood sugar levels have measured to be 300-400 when measured at home for the past few days. Patient states that it was over 500 today. He states that he took 30 units of humalog in the morning and 20 units of humalog 3 hours ago. He reports associated "pure water" diarrhea and states that he has had 10+ episodes of diarrhea per day for the past 2 days. Patient states that he is currently taking lasix medication. Per mother, patient complains of "cramping on the bottom parts of his legs". Patient also states that he has been coming to the hospital every 10 days to come get fluids for the past 4 months due to medication adjustments. He was last admitted 1 month ago. Patient states that he usually experiences episodes of emesis with prior onset of hyperglycemia, but currently denies emesis. Patient also reports associated decrease in urinary frequency. He denies associated fever.  Past Medical History  Diagnosis Date  . Hypertension   . Pancreatitis   . CHF (congestive heart failure)   . 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 Seasonal allergies   . Asthma   . Anxiety   . COPD (chronic obstructive pulmonary disease)   . Hyperlipidemia   . Gastric ulcer   . Fibromyalgia   . Kidney stone   . Myocardial infarction 1995  . Type II diabetes  mellitus   . Pneumonia     "several times"  . Chronic bronchitis     "get it q yr"  . OSA (obstructive sleep apnea) 04/24/2013    "use BiPAP"  . GERD (gastroesophageal reflux disease)   . EVOJJKKX(381.8)     "weekly" (12/04/2013)  . Arthritis     "joints"  . Chronic back pain     "mostly lower; have 2 bulging discs in my neck" (12/05/2013)  . Stroke 03/2013    "still weak on the left side" (12/05/2013)  . Stroke 12/04/2013    "came in today w/right sided weakness" (12/04/2013)  . Dementia     onset of early  . [facility] spotted fever) 11/22/2012   Past Surgical History  Procedure Laterality Date  . Cholecystectomy    . Multiple cysts removal-hip,wrist    . Upper gastrointestinal endoscopy    . Colonoscopy    . Tonsillectomy  ~ 1985  . Cardiac catheterization    . Cystoscopy w/ stone manipulation     Family History  Problem Relation Age of Onset  . Colon cancer Neg Hx   . Esophageal cancer Neg Hx   . Rectal cancer Neg Hx   . Skin cancer Father   . Lung cancer Paternal Grandfather   . Stomach cancer Maternal Grandfather   . Bone cancer Paternal Grandmother   . Diabetes Mother   . Diabetes Maternal Grandmother   .  Heart disease Maternal Grandmother   . Heart attack Father   . Heart disease Paternal Grandfather   . Heart attack Paternal Grandmother   . Hypertension Mother   . Arthritis Mother   . Other Mother     brain stem vertigo  . Other Mother     disorder of pancreas  . Stomach cancer Father   . Hypertension Father    History  Substance Use Topics  . Smoking status: Former Smoker -- 2.00 packs/day for 21 years    Types: Cigarettes    Quit date: 05/23/2002  . Smokeless tobacco: Never Used  . Alcohol Use: No   Review of Systems  10 Systems reviewed and all are negative for acute change except as noted in the HPI.  Allergies  Actos; Aricept; and Codeine  Home Medications   Prior to Admission medications   Medication Sig Start Date End Date  Taking? Authorizing Provider  albuterol (PROVENTIL HFA;VENTOLIN HFA) 108 (90 BASE) MCG/ACT inhaler Inhale 2 puffs into the lungs every 6 (six) hours as needed. For shortness of breath     Historical Provider, MD  citalopram (CELEXA) 40 MG tablet Take 40 mg by mouth daily.      Historical Provider, MD  clopidogrel (PLAVIX) 75 MG tablet Take 75 mg by mouth daily.    Historical Provider, MD  cyanocobalamin (,VITAMIN B-12,) 1000 MCG/ML injection Inject 1,000 mcg into the muscle once a week.     Historical Provider, MD  fluticasone (FLONASE) 50 MCG/ACT nasal spray Place 2 sprays into both nostrils daily.    Historical Provider, MD  furosemide (LASIX) 40 MG tablet Take 1 tablet (40 mg total) by mouth daily. 12/05/13   Geradine Girt, DO  gabapentin (NEURONTIN) 600 MG tablet Take 1,200 mg by mouth 3 (three) times daily.    Historical Provider, MD  HYDROcodone-acetaminophen (NORCO) 10-325 MG per tablet Take 1 tablet by mouth every 6 (six) hours.    Historical Provider, MD  insulin detemir (LEVEMIR) 100 UNIT/ML injection Inject 60 Units into the skin at bedtime.     Historical Provider, MD  insulin lispro protamine-lispro (HUMALOG 50/50 MIX) (50-50) 100 UNIT/ML SUSP injection Inject into the skin 2 (two) times daily before a meal.    Historical Provider, MD  lisinopril (PRINIVIL,ZESTRIL) 40 MG tablet Take 40 mg by mouth daily.     Historical Provider, MD  LORazepam (ATIVAN) 1 MG tablet Take 1 mg by mouth 2 (two) times daily.      Historical Provider, MD  Memantine HCl (NAMENDA PO) Take 1 tablet by mouth at bedtime.     Historical Provider, MD  omeprazole (PRILOSEC) 20 MG capsule Take 20 mg by mouth at bedtime.     Historical Provider, MD  ondansetron (ZOFRAN ODT) 4 MG disintegrating tablet Take 1 tablet (4 mg total) by mouth every 8 (eight) hours as needed for nausea or vomiting. 02/11/14   Kristen N Ward, DO  ondansetron (ZOFRAN) 4 MG tablet Take 1 tablet (4 mg total) by mouth every 8 (eight) hours as needed  for nausea or vomiting. 12/05/13   Geradine Girt, DO  ondansetron (ZOFRAN) 4 MG tablet Take 1 tablet (4 mg total) by mouth every 6 (six) hours. 03/10/14   Charlesetta Shanks, MD  Potassium Gluconate 595 MG CAPS Take 595 mg by mouth daily.     Historical Provider, MD  prochlorperazine (COMPAZINE) 25 MG suppository Place 1 suppository (25 mg total) rectally every 12 (twelve) hours as needed for nausea or  vomiting. 01/10/14   Gatha Mayer, MD  promethazine (PHENERGAN) 25 MG tablet Take 1 tablet (25 mg total) by mouth every 6 (six) hours as needed for nausea or vomiting. 02/11/14   Delice Bison Ward, DO  tiotropium (SPIRIVA) 18 MCG inhalation capsule Place 1 capsule (18 mcg total) into inhaler and inhale daily as needed (wheezing). 12/05/13   Geradine Girt, DO  traMADol (ULTRAM) 50 MG tablet Take 50 mg by mouth 2 (two) times daily.    Historical Provider, MD   Triage Vitals: BP 144/83  Pulse 85  Temp(Src) 98 F (36.7 C) (Oral)  Resp 20  Ht 5' 10"  (1.778 m)  Wt 279 lb (126.554 kg)  BMI 40.03 kg/m2  SpO2 98%  Physical Exam  Constitutional: He is oriented to person, place, and time.  This is a morbidly obese gentleman. He is nontoxic without any respiratory distress.  HENT:  Head: Normocephalic and atraumatic.  Nose: Nose normal.  Mouth/Throat: Oropharynx is clear and moist. No oropharyngeal exudate.  Eyes: EOM are normal. Pupils are equal, round, and reactive to light. Right eye exhibits no discharge. Left eye exhibits no discharge.  Neck: Normal range of motion. Neck supple.  Cardiovascular: Normal rate, regular rhythm, normal heart sounds and intact distal pulses.   Pulmonary/Chest: Effort normal and breath sounds normal. No respiratory distress. He has no wheezes. He has no rales.  Abdominal: Soft. He exhibits no distension. There is no rebound and no guarding.  Patient endorses diffuse pain to palpation of the right lateral and lower abdomen. There is no guarding. Examination of the skin folds  as I reveal any development of candida or maceration.  Musculoskeletal: Normal range of motion. He exhibits no edema and no tenderness.  Neurological: He is alert and oriented to person, place, and time. No cranial nerve deficit. Coordination normal.  Skin: Skin is warm and dry.  Psychiatric: He has a normal mood and affect.    ED Course  Procedures (including critical care time)  DIAGNOSTIC STUDIES: Oxygen Saturation is 98% on RA, normal by my interpretation.    COORDINATION OF CARE: 9:20 PM- Discussed plans to obtain diagnostic urinalysis and lab work. Pt advised of plan for treatment and pt agrees.  Labs Review Labs Reviewed  URINALYSIS, ROUTINE W REFLEX MICROSCOPIC - Abnormal; Notable for the following:    Specific Gravity, Urine 1.038 (*)    Glucose, UA >1000 (*)    All other components within normal limits  BASIC METABOLIC PANEL - Abnormal; Notable for the following:    Glucose, Bld 279 (*)    All other components within normal limits  CBG MONITORING, ED - Abnormal; Notable for the following:    Glucose-Capillary 338 (*)    All other components within normal limits  URINE MICROSCOPIC-ADD ON  CBC WITH DIFFERENTIAL   Imaging Review No results found.   EKG Interpretation None     MDM   Final diagnoses:  Diabetes mellitus type 2, uncontrolled, with complications  Abdominal pain, unspecified abdominal location    At this point time patient shows no sign of being acidotic. His renal function is within normal limits. Patient has had chronic problems with poor control of his diabetes. He currently feels improved and is well in appearance. The patient is safe for continued outpatient management with his family physician.  I personally performed the services described in this documentation, which was scribed in my presence. The recorded information has been reviewed and is accurate.  Charlesetta Shanks, MD 03/10/14  0023 

## 2014-03-09 NOTE — ED Notes (Signed)
Attempt IV x2 without success

## 2014-03-10 LAB — BASIC METABOLIC PANEL
Anion gap: 14 (ref 5–15)
BUN: 12 mg/dL (ref 6–23)
CO2: 26 meq/L (ref 19–32)
CREATININE: 0.6 mg/dL (ref 0.50–1.35)
Calcium: 9.2 mg/dL (ref 8.4–10.5)
Chloride: 97 mEq/L (ref 96–112)
GFR calc Af Amer: >90 mL/min (ref >90)
GFR calc non Af Amer: >90 mL/min (ref >90)
GLUCOSE: 279 mg/dL — AB (ref 70–99)
Potassium: 3.9 mEq/L (ref 3.7–5.3)
Sodium: 137 mEq/L (ref 137–147)

## 2014-03-10 LAB — CBG MONITORING, ED: Glucose-Capillary: 274 mg/dL — ABNORMAL HIGH (ref 70–99)

## 2014-03-10 MED ORDER — ONDANSETRON HCL 4 MG PO TABS: 4.0000 mg | ORAL_TABLET | Freq: Four times a day (QID) | ORAL | Status: DC

## 2014-03-10 NOTE — Discharge Instructions (Signed)
Abdominal Pain Many things can cause abdominal pain. Usually, abdominal pain is not caused by a disease and will improve without treatment. It can often be observed and treated at home. Your health care provider will do a physical exam and possibly order blood tests and X-rays to help determine the seriousness of your pain. However, in many cases, more time must pass before a clear cause of the pain can be found. Before that point, your health care provider may not know if you need more testing or further treatment. HOME CARE INSTRUCTIONS  Monitor your abdominal pain for any changes. The following actions may help to alleviate any discomfort you are experiencing:  Only take over-the-counter or prescription medicines as directed by your health care provider.  Do not take laxatives unless directed to do so by your health care provider.  Try a clear liquid diet (broth, tea, or water) as directed by your health care provider. Slowly move to a bland diet as tolerated. SEEK MEDICAL CARE IF:  You have unexplained abdominal pain.  You have abdominal pain associated with nausea or diarrhea.  You have pain when you urinate or have a bowel movement.  You experience abdominal pain that wakes you in the night.  You have abdominal pain that is worsened or improved by eating food.  You have abdominal pain that is worsened with eating fatty foods.  You have a fever. SEEK IMMEDIATE MEDICAL CARE IF:   Your pain does not go away within 2 hours.  You keep throwing up (vomiting).  Your pain is felt only in portions of the abdomen, such as the right side or the left lower portion of the abdomen.  You pass bloody or black tarry stools. MAKE SURE YOU:  Understand these instructions.   Will watch your condition.   Will get help right away if you are not doing well or get worse.  Document Released: 02/16/2005 Document Revised: 05/14/2013 Document Reviewed: 01/16/2013 [facility] Patient Information  2015 [place], [place]. This information is not intended to replace advice given to you by your health care provider. Make sure you discuss any questions you have with your health care provider.  Diabetes and Exercise Exercising regularly is important. It is not just about losing weight. It has many health benefits, such as:  Improving your overall fitness, flexibility, and endurance.  Increasing your bone density.  Helping with weight control.  Decreasing your body fat.  Increasing your muscle strength.  Reducing stress and tension.  Improving your overall health. People with diabetes who exercise gain additional benefits because exercise:  Reduces appetite.  Improves the body's use of blood sugar (glucose).  Helps lower or control blood glucose.  Decreases blood pressure.  Helps control blood lipids (such as cholesterol and triglycerides).  Improves the body's use of the hormone insulin by:  Increasing the body's insulin sensitivity.  Reducing the body's insulin needs.  Decreases the risk for heart disease because exercising:  Lowers cholesterol and triglycerides levels.  Increases the levels of good cholesterol (such as high-density lipoproteins [HDL]) in the body.  Lowers blood glucose levels. YOUR ACTIVITY PLAN  Choose an activity that you enjoy and set realistic goals. Your health care provider or diabetes educator can help you make an activity plan that works for you. Exercise regularly as directed by your health care provider. This includes:  Performing resistance training twice a week such as push-ups, sit-ups, lifting weights, or using resistance bands.  Performing 150 minutes of cardio exercises each week such  as walking, running, or playing sports.  Staying active and spending no more than 90 minutes at one time being inactive. Even short bursts of exercise are good for you. Three 10-minute sessions spread throughout the day are just as beneficial as a  single 30-minute session. Some exercise ideas include:  Taking the dog for a walk.  Taking the stairs instead of the elevator.  Dancing to your favorite song.  Doing an exercise video.  Doing your favorite exercise with a friend. RECOMMENDATIONS FOR EXERCISING WITH TYPE 1 OR TYPE 2 DIABETES   Check your blood glucose before exercising. If blood glucose levels are greater than 240 mg/dL, check for urine ketones. Do not exercise if ketones are present.  Avoid injecting insulin into areas of the body that are going to be exercised. For example, avoid injecting insulin into:  The arms when playing tennis.  The legs when jogging.  Keep a record of:  Food intake before and after you exercise.  Expected peak times of insulin action.  Blood glucose levels before and after you exercise.  The type and amount of exercise you have done.  Review your records with your health care provider. Your health care provider will help you to develop guidelines for adjusting food intake and insulin amounts before and after exercising.  If you take insulin or oral hypoglycemic agents, watch for signs and symptoms of hypoglycemia. They include:  Dizziness.  Shaking.  Sweating.  Chills.  Confusion.  Drink plenty of water while you exercise to prevent dehydration or heat stroke. Body water is lost during exercise and must be replaced.  Talk to your health care provider before starting an exercise program to make sure it is safe for you. Remember, almost any type of activity is better than none. Document Released: 07/30/2003 Document Revised: 09/23/2013 Document Reviewed: 10/16/2012 [facility] Patient Information 2015 [place], [place]. This information is not intended to replace advice given to you by your health care provider. Make sure you discuss any questions you have with your health care provider.

## 2014-03-10 NOTE — ED Notes (Signed)
I took CBG and got result of 274 mg. / dcltr.

## 2014-04-04 ENCOUNTER — Ambulatory Visit: Payer: Medicaid Other | Admitting: Internal Medicine

## 2014-04-11 ENCOUNTER — Encounter (HOSPITAL_BASED_OUTPATIENT_CLINIC_OR_DEPARTMENT_OTHER): Payer: Self-pay | Admitting: *Deleted

## 2014-04-11 ENCOUNTER — Emergency Department (HOSPITAL_BASED_OUTPATIENT_CLINIC_OR_DEPARTMENT_OTHER)
Admission: EM | Admit: 2014-04-11 | Discharge: 2014-04-11 | Disposition: A | Discharge status: Home / Self Care | Payer: Medicaid Other | Attending: Emergency Medicine | Admitting: Emergency Medicine

## 2014-04-11 DIAGNOSIS — Z9889 Other specified postprocedural states: Secondary | ICD-10-CM | POA: Insufficient documentation

## 2014-04-11 DIAGNOSIS — Z79899 Other long term (current) drug therapy: Secondary | ICD-10-CM | POA: Insufficient documentation

## 2014-04-11 DIAGNOSIS — Z8701 Personal history of pneumonia (recurrent): Secondary | ICD-10-CM | POA: Diagnosis not present

## 2014-04-11 DIAGNOSIS — E119 Type 2 diabetes mellitus without complications: Secondary | ICD-10-CM | POA: Insufficient documentation

## 2014-04-11 DIAGNOSIS — Z87442 Personal history of urinary calculi: Secondary | ICD-10-CM | POA: Diagnosis not present

## 2014-04-11 DIAGNOSIS — K219 Gastro-esophageal reflux disease without esophagitis: Secondary | ICD-10-CM | POA: Diagnosis not present

## 2014-04-11 DIAGNOSIS — Z9049 Acquired absence of other specified parts of digestive tract: Secondary | ICD-10-CM | POA: Diagnosis not present

## 2014-04-11 DIAGNOSIS — I10 Essential (primary) hypertension: Secondary | ICD-10-CM | POA: Diagnosis not present

## 2014-04-11 DIAGNOSIS — I252 Old myocardial infarction: Secondary | ICD-10-CM | POA: Diagnosis not present

## 2014-04-11 DIAGNOSIS — Z8619 Personal history of other infectious and parasitic diseases: Secondary | ICD-10-CM | POA: Diagnosis not present

## 2014-04-11 DIAGNOSIS — E860 Dehydration: Secondary | ICD-10-CM | POA: Diagnosis not present

## 2014-04-11 DIAGNOSIS — Z7952 Long term (current) use of systemic steroids: Secondary | ICD-10-CM | POA: Insufficient documentation

## 2014-04-11 DIAGNOSIS — M1990 Unspecified osteoarthritis, unspecified site: Secondary | ICD-10-CM | POA: Insufficient documentation

## 2014-04-11 DIAGNOSIS — Z87891 Personal history of nicotine dependence: Secondary | ICD-10-CM | POA: Diagnosis not present

## 2014-04-11 DIAGNOSIS — F419 Anxiety disorder, unspecified: Secondary | ICD-10-CM | POA: Insufficient documentation

## 2014-04-11 DIAGNOSIS — Z8673 Personal history of transient ischemic attack (TIA), and cerebral infarction without residual deficits: Secondary | ICD-10-CM | POA: Insufficient documentation

## 2014-04-11 DIAGNOSIS — Z9981 Dependence on supplemental oxygen: Secondary | ICD-10-CM | POA: Diagnosis not present

## 2014-04-11 DIAGNOSIS — I509 Heart failure, unspecified: Secondary | ICD-10-CM | POA: Insufficient documentation

## 2014-04-11 DIAGNOSIS — Z794 Long term (current) use of insulin: Secondary | ICD-10-CM | POA: Insufficient documentation

## 2014-04-11 DIAGNOSIS — K529 Noninfective gastroenteritis and colitis, unspecified: Secondary | ICD-10-CM

## 2014-04-11 DIAGNOSIS — F0390 Unspecified dementia without behavioral disturbance: Secondary | ICD-10-CM | POA: Insufficient documentation

## 2014-04-11 DIAGNOSIS — R109 Unspecified abdominal pain: Secondary | ICD-10-CM | POA: Diagnosis not present

## 2014-04-11 DIAGNOSIS — R197 Diarrhea, unspecified: Secondary | ICD-10-CM | POA: Diagnosis not present

## 2014-04-11 DIAGNOSIS — G4733 Obstructive sleep apnea (adult) (pediatric): Secondary | ICD-10-CM | POA: Diagnosis not present

## 2014-04-11 DIAGNOSIS — G8929 Other chronic pain: Secondary | ICD-10-CM | POA: Insufficient documentation

## 2014-04-11 LAB — COMPREHENSIVE METABOLIC PANEL
ALT: 24 U/L (ref 0–53)
AST: 23 U/L (ref 0–37)
Albumin: 4.1 g/dL (ref 3.5–5.2)
Alkaline Phosphatase: 50 U/L (ref 39–117)
Anion gap: 14 (ref 5–15)
BUN: 13 mg/dL (ref 6–23)
CALCIUM: 9.1 mg/dL (ref 8.4–10.5)
CO2: 24 mEq/L (ref 19–32)
CREATININE: 0.7 mg/dL (ref 0.50–1.35)
Chloride: 103 mEq/L (ref 96–112)
GFR calc Af Amer: >90 mL/min (ref >90)
GLUCOSE: 126 mg/dL — AB (ref 70–99)
Potassium: 3.7 mEq/L (ref 3.7–5.3)
Sodium: 141 mEq/L (ref 137–147)
TOTAL PROTEIN: 7.3 g/dL (ref 6.0–8.3)
Total Bilirubin: <0.2 mg/dL — ABNORMAL LOW (ref 0.3–1.2)

## 2014-04-11 LAB — CBC WITH DIFFERENTIAL/PLATELET
Basophils Absolute: 0 10*3/uL (ref 0.0–0.1)
Basophils Relative: 1 % (ref 0–1)
EOS ABS: 0.1 10*3/uL (ref 0.0–0.7)
EOS PCT: 2 % (ref 0–5)
HEMATOCRIT: 41.5 % (ref 39.0–52.0)
Hemoglobin: 14.2 g/dL (ref 13.0–17.0)
LYMPHS ABS: 2.2 10*3/uL (ref 0.7–4.0)
Lymphocytes Relative: 38 % (ref 12–46)
MCH: 30 pg (ref 26.0–34.0)
MCHC: 34.2 g/dL (ref 30.0–36.0)
MCV: 87.7 fL (ref 78.0–100.0)
MONO ABS: 0.5 10*3/uL (ref 0.1–1.0)
Monocytes Relative: 9 % (ref 3–12)
Neutro Abs: 2.9 10*3/uL (ref 1.7–7.7)
Neutrophils Relative %: 50 % (ref 43–77)
PLATELETS: 204 10*3/uL (ref 150–400)
RBC: 4.73 MIL/uL (ref 4.22–5.81)
RDW: 13.5 % (ref 11.5–15.5)
WBC: 5.8 10*3/uL (ref 4.0–10.5)

## 2014-04-11 LAB — URINALYSIS, ROUTINE W REFLEX MICROSCOPIC
Bilirubin Urine: NEGATIVE
Glucose, UA: >1000 mg/dL — AB
Hgb urine dipstick: NEGATIVE
Ketones, ur: 15 mg/dL — AB
LEUKOCYTES UA: NEGATIVE
NITRITE: NEGATIVE
PROTEIN: NEGATIVE mg/dL
SPECIFIC GRAVITY, URINE: 1.035 — AB (ref 1.005–1.030)
UROBILINOGEN UA: 1 mg/dL (ref 0.0–1.0)
pH: 6 (ref 5.0–8.0)

## 2014-04-11 LAB — URINE MICROSCOPIC-ADD ON

## 2014-04-11 LAB — LIPASE, BLOOD: LIPASE: 34 U/L (ref 11–59)

## 2014-04-11 MED ORDER — SODIUM CHLORIDE 0.9 % IV BOLUS (SEPSIS)
1000.0000 mL | Freq: Once | INTRAVENOUS | Status: AC
  Administered 2014-04-11: 1000 mL via INTRAVENOUS

## 2014-04-11 MED ORDER — SODIUM CHLORIDE 0.9 % IV BOLUS (SEPSIS)
1000.0000 mL | Freq: Once | INTRAVENOUS | Status: AC
  Administered 2014-04-11 (×2): 1000 mL via INTRAVENOUS

## 2014-04-11 NOTE — ED Notes (Signed)
Pt. Reports he started having diarrhea and nausea confusion and weakness this started a week ago.  Today pt. Reports his blood sugar was 190 at 10:30.  Pt. Reports he comes here approx. Every 10 days for fluids for dehydration.

## 2014-04-11 NOTE — ED Provider Notes (Signed)
CSN: [id number]     Arrival date & time 04/11/14  1347 History   First MD Initiated Contact with Patient 04/11/14 1605     Chief Complaint  Patient presents with  . Dehydration     (Consider location/radiation/quality/duration/timing/severity/associated sxs/prior Treatment) HPI 53 year old male with history of diabetes, heart failure, obesity, chronic daily abdominal pain and chronic daily diarrhea and chronic daily nausea with intermittent chronic vomiting for over 2 years, presents with a few days of a flareup of increased diarrhea compared to baseline with baseline diffuse chronic moderate abdominal pain unchanged with blood sugars running less than 200 today which is good for the patient, he has no vomiting today, he has nausea medicines at home and does not want antinausea medicine in the ED, he has no fever, there is no change in his baseline confusion, he has dementia with mild confusion at baseline especially to time, he has no dysuria no chest pain no shortness of breath no other concerns. He has had multiple ED visits for dehydration previously and requests IV fluid bolus today. Past Medical History  Diagnosis Date  . Hypertension   . Pancreatitis   . CHF (congestive heart failure)   . 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 Seasonal allergies   . Asthma   . Anxiety   . COPD (chronic obstructive pulmonary disease)   . Hyperlipidemia   . Gastric ulcer   . Fibromyalgia   . Kidney stone   . Myocardial infarction 1995  . Type II diabetes mellitus   . Pneumonia     "several times"  . Chronic bronchitis     "get it q yr"  . OSA (obstructive sleep apnea) 04/24/2013    "use BiPAP"  . GERD (gastroesophageal reflux disease)   . DPOEUMPN(361.4)     "weekly" (12/04/2013)  . Arthritis     "joints"  . Chronic back pain     "mostly lower; have 2 bulging discs in my neck" (12/05/2013)  . Stroke 03/2013    "still weak on the left side" (12/05/2013)  . Stroke 12/04/2013   "came in today w/right sided weakness" (12/04/2013)  . Dementia     onset of early  . RMS[facility] spotted fever) 11/22/2012   Past Surgical History  Procedure Laterality Date  . Cholecystectomy    . Multiple cysts removal-hip,wrist    . Upper gastrointestinal endoscopy    . Colonoscopy    . Tonsillectomy  ~ 1985  . Cardiac catheterization    . Cystoscopy w/ stone manipulation     Family History  Problem Relation Age of Onset  . Colon cancer Neg Hx   . Esophageal cancer Neg Hx   . Rectal cancer Neg Hx   . Skin cancer Father   . Lung cancer Paternal Grandfather   . Stomach cancer Maternal Grandfather   . Bone cancer Paternal Grandmother   . Diabetes Mother   . Diabetes Maternal Grandmother   . Heart disease Maternal Grandmother   . Heart attack Father   . Heart disease Paternal Grandfather   . Heart attack Paternal Grandmother   . Hypertension Mother   . Arthritis Mother   . Other Mother     brain stem vertigo  . Other Mother     disorder of pancreas  . Stomach cancer Father   . Hypertension Father    History  Substance Use Topics  . Smoking status: Former Smoker -- 2.00 packs/day for 21 years  Types: Cigarettes    Quit date: 05/23/2002  . Smokeless tobacco: Never Used  . Alcohol Use: No    Review of Systems 10 Systems reviewed and are negative for acute change except as noted in the HPI.   Allergies  Actos; Aricept; and Codeine  Home Medications   Prior to Admission medications   Medication Sig Start Date End Date Taking? Authorizing Provider  albuterol (PROVENTIL HFA;VENTOLIN HFA) 108 (90 BASE) MCG/ACT inhaler Inhale 2 puffs into the lungs every 6 (six) hours as needed. For shortness of breath     Historical Provider, MD  citalopram (CELEXA) 40 MG tablet Take 40 mg by mouth daily.      Historical Provider, MD  clopidogrel (PLAVIX) 75 MG tablet Take 75 mg by mouth daily.    Historical Provider, MD  cyanocobalamin (,VITAMIN B-12,) 1000 MCG/ML  injection Inject 1,000 mcg into the muscle once a week.     Historical Provider, MD  fluticasone (FLONASE) 50 MCG/ACT nasal spray Place 2 sprays into both nostrils daily.    Historical Provider, MD  furosemide (LASIX) 40 MG tablet Take 1 tablet (40 mg total) by mouth daily. 12/05/13   Geradine Girt, DO  gabapentin (NEURONTIN) 600 MG tablet Take 1,200 mg by mouth 3 (three) times daily.    Historical Provider, MD  HYDROcodone-acetaminophen (NORCO) 10-325 MG per tablet Take 1 tablet by mouth every 6 (six) hours.    Historical Provider, MD  insulin detemir (LEVEMIR) 100 UNIT/ML injection Inject 60 Units into the skin at bedtime.     Historical Provider, MD  insulin lispro protamine-lispro (HUMALOG 50/50 MIX) (50-50) 100 UNIT/ML SUSP injection Inject into the skin 2 (two) times daily before a meal.    Historical Provider, MD  lisinopril (PRINIVIL,ZESTRIL) 40 MG tablet Take 40 mg by mouth daily.     Historical Provider, MD  LORazepam (ATIVAN) 1 MG tablet Take 1 mg by mouth 2 (two) times daily.      Historical Provider, MD  Memantine HCl (NAMENDA PO) Take 1 tablet by mouth at bedtime.     Historical Provider, MD  omeprazole (PRILOSEC) 20 MG capsule Take 20 mg by mouth at bedtime.     Historical Provider, MD  ondansetron (ZOFRAN ODT) 4 MG disintegrating tablet Take 1 tablet (4 mg total) by mouth every 8 (eight) hours as needed for nausea or vomiting. 02/11/14   Kristen N Ward, DO  ondansetron (ZOFRAN) 4 MG tablet Take 1 tablet (4 mg total) by mouth every 8 (eight) hours as needed for nausea or vomiting. 12/05/13   Geradine Girt, DO  ondansetron (ZOFRAN) 4 MG tablet Take 1 tablet (4 mg total) by mouth every 6 (six) hours. 03/10/14   Charlesetta Shanks, MD  Potassium Gluconate 595 MG CAPS Take 595 mg by mouth daily.     Historical Provider, MD  prochlorperazine (COMPAZINE) 25 MG suppository Place 1 suppository (25 mg total) rectally every 12 (twelve) hours as needed for nausea or vomiting. 01/10/14   Gatha Mayer,  MD  promethazine (PHENERGAN) 25 MG tablet Take 1 tablet (25 mg total) by mouth every 6 (six) hours as needed for nausea or vomiting. 02/11/14   Kristen N Ward, DO  tiotropium (SPIRIVA) 18 MCG inhalation capsule Place 1 capsule (18 mcg total) into inhaler and inhale daily as needed (wheezing). 12/05/13   Geradine Girt, DO  traMADol (ULTRAM) 50 MG tablet Take 50 mg by mouth 2 (two) times daily.    Historical Provider, MD  BP 138/67 mmHg  Pulse 75  Temp(Src) 98 F (36.7 C) (Oral)  Resp 18  Ht 5' 9"  (1.753 m)  Wt 176 lb (79.833 kg)  BMI 25.98 kg/m2  SpO2 97% Physical Exam  Constitutional: He is oriented to person, place, and time.  Awake, alert, nontoxic appearance.  HENT:  Head: Atraumatic.  Eyes: Right eye exhibits no discharge. Left eye exhibits no discharge.  Neck: Neck supple.  Cardiovascular: Normal rate and regular rhythm.   No murmur heard. Pulmonary/Chest: Effort normal and breath sounds normal. No respiratory distress. He has no wheezes. He has no rales. He exhibits no tenderness.  Abdominal: Soft. Bowel sounds are normal. He exhibits no distension and no mass. There is tenderness. There is no rebound and no guarding.  Mild diffuse tenderness without rebound  Musculoskeletal: He exhibits no tenderness.  Baseline ROM, no obvious new focal weakness.  Neurological: He is alert and oriented to person, place, and time.  Mental status and motor strength appears baseline for patient and situation.  Skin: No rash noted.  Psychiatric: He has a normal mood and affect.  Nursing note and vitals reviewed.   ED Course  Procedures (including critical care time) Labs Review Labs Reviewed  COMPREHENSIVE METABOLIC PANEL - Abnormal; Notable for the following:    Glucose, Bld 126 (*)    Total Bilirubin <0.2 (*)    All other components within normal limits  URINALYSIS, ROUTINE W REFLEX MICROSCOPIC - Abnormal; Notable for the following:    Specific Gravity, Urine 1.035 (*)    Glucose, UA  >1000 (*)    Ketones, ur 15 (*)    All other components within normal limits  CBC WITH DIFFERENTIAL  LIPASE, BLOOD  URINE MICROSCOPIC-ADD ON    Imaging Review No results found.   EKG Interpretation None      MDM   Final diagnoses:  Dehydration  Chronic diarrhea  Chronic abdominal pain    Pt feels improved after observation and/or treatment in ED.Patient  informed of clinical course, understand medical decision-making process, and agree with plan. I doubt any other EMC precluding discharge at this time including, but not necessarily limited to the following:SBI.   Babette Relic, MD 04/17/14 [phone]

## 2014-04-11 NOTE — Discharge Instructions (Signed)
Abdominal (belly) pain can be caused by many things. Your caregiver performed an examination and possibly ordered blood/urine tests and imaging (CT scan, x-rays, ultrasound). Many cases can be observed and treated at home after initial evaluation in the emergency department. Even though you are being discharged home, abdominal pain can be unpredictable. Therefore, you need a repeated exam if your pain does not resolve, returns, or worsens. Most patients with abdominal pain don't have to be admitted to the hospital or have surgery, but serious problems like appendicitis and gallbladder attacks can start out as nonspecific pain. Many abdominal conditions cannot be diagnosed in one visit, so follow-up evaluations are very important. SEEK IMMEDIATE MEDICAL ATTENTION IF: The pain does not go away or becomes severe.  A temperature above 101 develops.  Repeated vomiting occurs (multiple episodes).  The pain becomes localized to portions of the abdomen. The right side could possibly be appendicitis. In an adult, the left lower portion of the abdomen could be colitis or diverticulitis.  Blood is being passed in stools or vomit (bright red or black tarry stools).  Return also if you develop chest pain, difficulty breathing, dizziness or fainting, or become confused, poorly responsive, or inconsolable (young children).  Your caregiver has seen you today because you are having problems with feelings of weakness, dizziness, and/or fatigue. Weakness has many different causes, some of which are common and others are very rare. Your caregiver has considered some of the most common causes of weakness and feels it is safe for you to go home and be observed. Not every illness or injury can be identified during an emergency department visit, thus follow-up with your primary healthcare provider is important. Medical conditions can also worsen, so it is also important to return immediately as directed below, or if you have other  serious concerns develop. RETURN IMMEDIATELY IF you develop new shortness of breath, chest pain, fever, have difficulty moving parts of your body (new weakness, numbness, or incoordination), sudden change in speech, vision, swallowing, or understanding, faint or develop new dizziness, severe headache, become poorly responsive or have an altered mental status compared to baseline for you, new rash, abdominal pain, or bloody stools,  Return sooner also if you develop new problems for which you have not talked to your caregiver but you feel may be emergency medical conditions, or are unable to be cared for safely at home.

## 2014-04-11 NOTE — ED Notes (Signed)
MD at bedside. 

## 2014-04-17 ENCOUNTER — Encounter (HOSPITAL_BASED_OUTPATIENT_CLINIC_OR_DEPARTMENT_OTHER): Payer: Self-pay

## 2014-04-17 ENCOUNTER — Emergency Department (HOSPITAL_BASED_OUTPATIENT_CLINIC_OR_DEPARTMENT_OTHER)
Admission: EM | Admit: 2014-04-17 | Discharge: 2014-04-17 | Disposition: A | Discharge status: Home / Self Care | Payer: Medicaid Other | Attending: Emergency Medicine | Admitting: Emergency Medicine

## 2014-04-17 DIAGNOSIS — G4733 Obstructive sleep apnea (adult) (pediatric): Secondary | ICD-10-CM | POA: Insufficient documentation

## 2014-04-17 DIAGNOSIS — I10 Essential (primary) hypertension: Secondary | ICD-10-CM | POA: Insufficient documentation

## 2014-04-17 DIAGNOSIS — J449 Chronic obstructive pulmonary disease, unspecified: Secondary | ICD-10-CM | POA: Diagnosis not present

## 2014-04-17 DIAGNOSIS — Z8673 Personal history of transient ischemic attack (TIA), and cerebral infarction without residual deficits: Secondary | ICD-10-CM | POA: Insufficient documentation

## 2014-04-17 DIAGNOSIS — F0390 Unspecified dementia without behavioral disturbance: Secondary | ICD-10-CM | POA: Diagnosis not present

## 2014-04-17 DIAGNOSIS — R112 Nausea with vomiting, unspecified: Secondary | ICD-10-CM | POA: Insufficient documentation

## 2014-04-17 DIAGNOSIS — Z8619 Personal history of other infectious and parasitic diseases: Secondary | ICD-10-CM | POA: Insufficient documentation

## 2014-04-17 DIAGNOSIS — R197 Diarrhea, unspecified: Secondary | ICD-10-CM | POA: Diagnosis present

## 2014-04-17 DIAGNOSIS — I252 Old myocardial infarction: Secondary | ICD-10-CM | POA: Diagnosis not present

## 2014-04-17 DIAGNOSIS — F419 Anxiety disorder, unspecified: Secondary | ICD-10-CM | POA: Diagnosis not present

## 2014-04-17 DIAGNOSIS — I509 Heart failure, unspecified: Secondary | ICD-10-CM | POA: Diagnosis not present

## 2014-04-17 DIAGNOSIS — Z794 Long term (current) use of insulin: Secondary | ICD-10-CM | POA: Insufficient documentation

## 2014-04-17 DIAGNOSIS — Z79899 Other long term (current) drug therapy: Secondary | ICD-10-CM | POA: Insufficient documentation

## 2014-04-17 DIAGNOSIS — Z87442 Personal history of urinary calculi: Secondary | ICD-10-CM | POA: Insufficient documentation

## 2014-04-17 DIAGNOSIS — K219 Gastro-esophageal reflux disease without esophagitis: Secondary | ICD-10-CM | POA: Insufficient documentation

## 2014-04-17 DIAGNOSIS — E119 Type 2 diabetes mellitus without complications: Secondary | ICD-10-CM | POA: Diagnosis not present

## 2014-04-17 DIAGNOSIS — Z7951 Long term (current) use of inhaled steroids: Secondary | ICD-10-CM | POA: Diagnosis not present

## 2014-04-17 DIAGNOSIS — Z8701 Personal history of pneumonia (recurrent): Secondary | ICD-10-CM | POA: Insufficient documentation

## 2014-04-17 DIAGNOSIS — Z9981 Dependence on supplemental oxygen: Secondary | ICD-10-CM | POA: Diagnosis not present

## 2014-04-17 DIAGNOSIS — Z87891 Personal history of nicotine dependence: Secondary | ICD-10-CM | POA: Diagnosis not present

## 2014-04-17 LAB — COMPREHENSIVE METABOLIC PANEL
ALK PHOS: 48 U/L (ref 39–117)
ALT: 27 U/L (ref 0–53)
ANION GAP: 13 (ref 5–15)
AST: 24 U/L (ref 0–37)
Albumin: 4 g/dL (ref 3.5–5.2)
BILIRUBIN TOTAL: 0.3 mg/dL (ref 0.3–1.2)
BUN: 14 mg/dL (ref 6–23)
CHLORIDE: 103 meq/L (ref 96–112)
CO2: 26 meq/L (ref 19–32)
Calcium: 8.8 mg/dL (ref 8.4–10.5)
Creatinine, Ser: 0.6 mg/dL (ref 0.50–1.35)
GFR calc non Af Amer: >90 mL/min (ref >90)
GLUCOSE: 109 mg/dL — AB (ref 70–99)
POTASSIUM: 3.8 meq/L (ref 3.7–5.3)
SODIUM: 142 meq/L (ref 137–147)
Total Protein: 7 g/dL (ref 6.0–8.3)

## 2014-04-17 LAB — CBC WITH DIFFERENTIAL/PLATELET
Basophils Absolute: 0 10*3/uL (ref 0.0–0.1)
Basophils Relative: 1 % (ref 0–1)
Eosinophils Absolute: 0.1 10*3/uL (ref 0.0–0.7)
Eosinophils Relative: 2 % (ref 0–5)
HCT: 41.6 % (ref 39.0–52.0)
Hemoglobin: 14 g/dL (ref 13.0–17.0)
LYMPHS ABS: 0.8 10*3/uL (ref 0.7–4.0)
LYMPHS PCT: 18 % (ref 12–46)
MCH: 30 pg (ref 26.0–34.0)
MCHC: 33.7 g/dL (ref 30.0–36.0)
MCV: 89.1 fL (ref 78.0–100.0)
MONOS PCT: 10 % (ref 3–12)
Monocytes Absolute: 0.5 10*3/uL (ref 0.1–1.0)
NEUTROS PCT: 69 % (ref 43–77)
Neutro Abs: 3.1 10*3/uL (ref 1.7–7.7)
PLATELETS: 168 10*3/uL (ref 150–400)
RBC: 4.67 MIL/uL (ref 4.22–5.81)
RDW: 13.8 % (ref 11.5–15.5)
WBC: 4.5 10*3/uL (ref 4.0–10.5)

## 2014-04-17 LAB — URINE MICROSCOPIC-ADD ON

## 2014-04-17 LAB — URINALYSIS, ROUTINE W REFLEX MICROSCOPIC
Bilirubin Urine: NEGATIVE
Glucose, UA: >1000 mg/dL — AB
HGB URINE DIPSTICK: NEGATIVE
KETONES UR: 40 mg/dL — AB
Leukocytes, UA: NEGATIVE
NITRITE: NEGATIVE
Protein, ur: NEGATIVE mg/dL
SPECIFIC GRAVITY, URINE: 1.033 — AB (ref 1.005–1.030)
UROBILINOGEN UA: 1 mg/dL (ref 0.0–1.0)
pH: 7.5 (ref 5.0–8.0)

## 2014-04-17 LAB — CBG MONITORING, ED: GLUCOSE-CAPILLARY: 106 mg/dL — AB (ref 70–99)

## 2014-04-17 MED ORDER — SODIUM CHLORIDE 0.9 % IV BOLUS (SEPSIS)
1000.0000 mL | Freq: Once | INTRAVENOUS | Status: AC
  Administered 2014-04-17: 1000 mL via INTRAVENOUS

## 2014-04-17 MED ORDER — ONDANSETRON HCL 4 MG PO TABS: 4.0000 mg | ORAL_TABLET | Freq: Four times a day (QID) | ORAL | Status: DC

## 2014-04-17 MED ORDER — KETOROLAC TROMETHAMINE 30 MG/ML IJ SOLN
30.0000 mg | Freq: Once | INTRAMUSCULAR | Status: AC
  Administered 2014-04-17: 30 mg via INTRAVENOUS
  Filled 2014-04-17: qty 1

## 2014-04-17 MED ORDER — ONDANSETRON HCL 4 MG/2ML IJ SOLN
4.0000 mg | Freq: Once | INTRAMUSCULAR | Status: AC
  Administered 2014-04-17: 4 mg via INTRAVENOUS
  Filled 2014-04-17: qty 2

## 2014-04-17 NOTE — ED Provider Notes (Signed)
CSN: [id number]     Arrival date & time 04/17/14  1118 History   First MD Initiated Contact with Patient 04/17/14 1300     Chief Complaint  Patient presents with  . Diarrhea   (Consider location/radiation/quality/duration/timing/severity/associated sxs/prior Treatment) HPI  Bryan Becker is a 53 yo male presenting with diarrhea, x 2 days, nausea and vomiting x 1 days.  These are chronic and recurrent problems reportedly due to his aggressive diabetes management.  His sugars are now well controlled but they cause nausea, vomiting, diarrhea and subsequent feeling dehydrated and weak.  Pt states usually he can go between 7 and 14 days before needing more IV fluid and nausea meds but reports was here 6 days ago. He reports 10- 15 episodes of diarrhea yesterday and 2-3 episodes today. He reports 4 episodes of vomiting in the last 24 hours. He notes a mild headache that began since arriving to the ED but feels it is related to the vomiting. He reported confusion over the last week to the triage RN but reports this is not unlike the confusion he has related to his early onset dementia. He denies fever, chest pain, shortness of breath, blurred vision, slurred speech, or focal weakness.  Past Medical History  Diagnosis Date  . Hypertension   . Pancreatitis   . CHF (congestive heart failure)   . 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 Seasonal allergies   . Asthma   . Anxiety   . COPD (chronic obstructive pulmonary disease)   . Hyperlipidemia   . Gastric ulcer   . Fibromyalgia   . Kidney stone   . Myocardial infarction 1995  . Type II diabetes mellitus   . Pneumonia     "several times"  . Chronic bronchitis     "get it q yr"  . OSA (obstructive sleep apnea) 04/24/2013    "use BiPAP"  . GERD (gastroesophageal reflux disease)   . PJASNKNL(976.7)     "weekly" (12/04/2013)  . Arthritis     "joints"  . Chronic back pain     "mostly lower; have 2 bulging discs in my neck"  (12/05/2013)  . Stroke 03/2013    "still weak on the left side" (12/05/2013)  . Stroke 12/04/2013    "came in today w/right sided weakness" (12/04/2013)  . Dementia     onset of early  . [facility] spotted fever) 11/22/2012   Past Surgical History  Procedure Laterality Date  . Cholecystectomy    . Multiple cysts removal-hip,wrist    . Upper gastrointestinal endoscopy    . Colonoscopy    . Tonsillectomy  ~ 1985  . Cardiac catheterization    . Cystoscopy w/ stone manipulation     Family History  Problem Relation Age of Onset  . Colon cancer Neg Hx   . Esophageal cancer Neg Hx   . Rectal cancer Neg Hx   . Skin cancer Father   . Lung cancer Paternal Grandfather   . Stomach cancer Maternal Grandfather   . Bone cancer Paternal Grandmother   . Diabetes Mother   . Diabetes Maternal Grandmother   . Heart disease Maternal Grandmother   . Heart attack Father   . Heart disease Paternal Grandfather   . Heart attack Paternal Grandmother   . Hypertension Mother   . Arthritis Mother   . Other Mother     brain stem vertigo  . Other Mother     disorder of pancreas  . Stomach  cancer Father   . Hypertension Father    History  Substance Use Topics  . Smoking status: Former Smoker -- 2.00 packs/day for 21 years    Types: Cigarettes    Quit date: 05/23/2002  . Smokeless tobacco: Never Used  . Alcohol Use: No    Review of Systems  Constitutional: Negative for fever and chills.  HENT: Negative for sore throat.   Eyes: Negative for visual disturbance.  Respiratory: Negative for cough and shortness of breath.   Cardiovascular: Negative for chest pain and leg swelling.  Gastrointestinal: Positive for nausea, vomiting, abdominal pain and diarrhea.  Genitourinary: Negative for dysuria.  Musculoskeletal: Negative for myalgias.  Skin: Negative for rash.  Neurological: Negative for weakness, numbness and headaches.    Allergies  Actos; Aricept; and Codeine  Home Medications    Prior to Admission medications   Medication Sig Start Date End Date Taking? Authorizing Provider  albuterol (PROVENTIL HFA;VENTOLIN HFA) 108 (90 BASE) MCG/ACT inhaler Inhale 2 puffs into the lungs every 6 (six) hours as needed. For shortness of breath     Historical Provider, MD  citalopram (CELEXA) 40 MG tablet Take 40 mg by mouth daily.      Historical Provider, MD  clopidogrel (PLAVIX) 75 MG tablet Take 75 mg by mouth daily.    Historical Provider, MD  cyanocobalamin (,VITAMIN B-12,) 1000 MCG/ML injection Inject 1,000 mcg into the muscle once a week.     Historical Provider, MD  fluticasone (FLONASE) 50 MCG/ACT nasal spray Place 2 sprays into both nostrils daily.    Historical Provider, MD  furosemide (LASIX) 40 MG tablet Take 1 tablet (40 mg total) by mouth daily. 12/05/13   Geradine Girt, DO  gabapentin (NEURONTIN) 600 MG tablet Take 1,200 mg by mouth 3 (three) times daily.    Historical Provider, MD  HYDROcodone-acetaminophen (NORCO) 10-325 MG per tablet Take 1 tablet by mouth every 6 (six) hours.    Historical Provider, MD  insulin detemir (LEVEMIR) 100 UNIT/ML injection Inject 60 Units into the skin at bedtime.     Historical Provider, MD  insulin lispro protamine-lispro (HUMALOG 50/50 MIX) (50-50) 100 UNIT/ML SUSP injection Inject into the skin 2 (two) times daily before a meal.    Historical Provider, MD  lisinopril (PRINIVIL,ZESTRIL) 40 MG tablet Take 40 mg by mouth daily.     Historical Provider, MD  LORazepam (ATIVAN) 1 MG tablet Take 1 mg by mouth 2 (two) times daily.      Historical Provider, MD  Memantine HCl (NAMENDA PO) Take 1 tablet by mouth at bedtime.     Historical Provider, MD  omeprazole (PRILOSEC) 20 MG capsule Take 20 mg by mouth at bedtime.     Historical Provider, MD  ondansetron (ZOFRAN ODT) 4 MG disintegrating tablet Take 1 tablet (4 mg total) by mouth every 8 (eight) hours as needed for nausea or vomiting. 02/11/14   Kristen N Ward, DO  ondansetron (ZOFRAN) 4 MG  tablet Take 1 tablet (4 mg total) by mouth every 8 (eight) hours as needed for nausea or vomiting. 12/05/13   Geradine Girt, DO  ondansetron (ZOFRAN) 4 MG tablet Take 1 tablet (4 mg total) by mouth every 6 (six) hours. 03/10/14   Charlesetta Shanks, MD  Potassium Gluconate 595 MG CAPS Take 595 mg by mouth daily.     Historical Provider, MD  prochlorperazine (COMPAZINE) 25 MG suppository Place 1 suppository (25 mg total) rectally every 12 (twelve) hours as needed for nausea or vomiting. 01/10/14  Gatha Mayer, MD  promethazine (PHENERGAN) 25 MG tablet Take 1 tablet (25 mg total) by mouth every 6 (six) hours as needed for nausea or vomiting. 02/11/14   Delice Bison Ward, DO  tiotropium (SPIRIVA) 18 MCG inhalation capsule Place 1 capsule (18 mcg total) into inhaler and inhale daily as needed (wheezing). 12/05/13   Geradine Girt, DO  traMADol (ULTRAM) 50 MG tablet Take 50 mg by mouth 2 (two) times daily.    Historical Provider, MD   BP 155/85 mmHg  Pulse 105  Temp(Src) 99.7 F (37.6 C) (Oral)  Resp 20  Ht 5' 9"  (1.753 m)  Wt 275 lb (124.739 kg)  BMI 40.59 kg/m2  SpO2 100% Physical Exam  Constitutional: He appears well-developed and well-nourished. No distress.  HENT:  Head: Normocephalic and atraumatic.  Mouth/Throat: Oropharynx is clear and moist. No oropharyngeal exudate.  Eyes: Conjunctivae are normal.  Neck: Neck supple. No thyromegaly present.  Cardiovascular: Normal rate, regular rhythm and intact distal pulses.   Pulmonary/Chest: Effort normal and breath sounds normal. No respiratory distress. He has no wheezes. He has no rales. He exhibits no tenderness.  Abdominal: Soft. He exhibits no distension and no mass. There is generalized tenderness. There is no rigidity, no rebound, no guarding, no CVA tenderness, no tenderness at McBurney's point and negative Murphy's sign.  Musculoskeletal: He exhibits no tenderness.  Lymphadenopathy:    He has no cervical adenopathy.  Neurological: He is  alert.  Skin: Skin is warm and dry. No rash noted. He is not diaphoretic.  Psychiatric: He has a normal mood and affect.  Nursing note and vitals reviewed.   ED Course  Procedures (including critical care time) Labs Review Labs Reviewed  COMPREHENSIVE METABOLIC PANEL - Abnormal; Notable for the following:    Glucose, Bld 109 (*)    All other components within normal limits  URINALYSIS, ROUTINE W REFLEX MICROSCOPIC - Abnormal; Notable for the following:    Specific Gravity, Urine 1.033 (*)    Glucose, UA >1000 (*)    Ketones, ur 40 (*)    All other components within normal limits  CBG MONITORING, ED - Abnormal; Notable for the following:    Glucose-Capillary 106 (*)    All other components within normal limits  CBC WITH DIFFERENTIAL  URINE MICROSCOPIC-ADD ON  CBG MONITORING, ED    Imaging Review No results found.   EKG Interpretation None      MDM   Final diagnoses:  Nausea vomiting and diarrhea   53 yo male with recurrent n/v/d related to diabetes mgmt regimen. CBC, CMP, UA reviewed and without significant abnormality except for ketones in urine consistent with dehydration.  His lungs are clear and there is no focal abdominal pain, no concern for appendicitis, cholecystitis, pancreatitis, ruptured viscus, UTI, kidney stone, or any other abdominal etiology. His vitals are stable with no fever.  He is well appearing after fluid bolus and nausea meds and tolerating PO fluids. Supportive therapy indicated with return if symptoms worsen.  Patient aware of plan and in agreement.   Filed Vitals:   04/17/14 1133 04/17/14 1539  BP: 155/85 130/58  Pulse: 105 80  Temp: 99.7 F (37.6 C) 98.1 F (36.7 C)  TempSrc: Oral Oral  Resp: 20 18  Height: 5' 9"  (1.753 m)   Weight: 275 lb (124.739 kg)   SpO2: 100% 99%   Meds given in ED:  Medications  sodium chloride 0.9 % bolus 1,000 mL (0 mLs Intravenous Stopped 04/17/14 1541)  ketorolac (TORADOL) 30 MG/ML injection 30 mg (30 mg  Intravenous Given 04/17/14 1327)  ondansetron (ZOFRAN) injection 4 mg (4 mg Intravenous Given 04/17/14 1327)    Discharge Medication List as of 04/17/2014  3:41 PM      Start     Ordered   04/17/14 0000  ondansetron (ZOFRAN) 4 MG tablet Every 6 hours Discontinue Reprint 04/17/14 1556         [address], NP 04/20/14 1222  Charlesetta Shanks, MD 04/20/14 1651

## 2014-04-17 NOTE — ED Notes (Signed)
Patient aware of necessary urine sample, states he cannot go at this time, patient given urinal.

## 2014-04-17 NOTE — ED Notes (Signed)
Diarrhea x 2 days and vomiting that started yesterday.

## 2014-04-17 NOTE — ED Notes (Signed)
Family at bedside. 

## 2014-04-17 NOTE — Discharge Instructions (Signed)
Please follow the directions provided.  Be sure to follow-up with your primary care provider regarding this visit.  Don't hesitate to return for new, worsening or concerning symptoms.     SEEK IMMEDIATE MEDICAL CARE IF:  You are unable to keep fluids down.  You have persistent vomiting.  You have blood in your stool, or your stools are black and tarry.  You do not urinate in 6-8 hours, or there is only a small amount of very dark urine.  You have abdominal pain that increases or localizes.  You have weakness, dizziness, confusion, or light-headedness.  You have a severe headache.  Your diarrhea gets worse or does not get better.  You have a fever or persistent symptoms for more than 2-3 days.  You have a fever and your symptoms suddenly get worse.

## 2014-04-29 ENCOUNTER — Emergency Department (HOSPITAL_BASED_OUTPATIENT_CLINIC_OR_DEPARTMENT_OTHER)
Admission: EM | Admit: 2014-04-29 | Discharge: 2014-04-29 | Disposition: A | Discharge status: Home / Self Care | Payer: Medicaid Other | Attending: Emergency Medicine | Admitting: Emergency Medicine

## 2014-04-29 ENCOUNTER — Encounter (HOSPITAL_BASED_OUTPATIENT_CLINIC_OR_DEPARTMENT_OTHER): Payer: Self-pay | Admitting: Emergency Medicine

## 2014-04-29 DIAGNOSIS — M1990 Unspecified osteoarthritis, unspecified site: Secondary | ICD-10-CM | POA: Insufficient documentation

## 2014-04-29 DIAGNOSIS — Z8619 Personal history of other infectious and parasitic diseases: Secondary | ICD-10-CM | POA: Diagnosis not present

## 2014-04-29 DIAGNOSIS — J449 Chronic obstructive pulmonary disease, unspecified: Secondary | ICD-10-CM | POA: Insufficient documentation

## 2014-04-29 DIAGNOSIS — Z8701 Personal history of pneumonia (recurrent): Secondary | ICD-10-CM | POA: Insufficient documentation

## 2014-04-29 DIAGNOSIS — Z9889 Other specified postprocedural states: Secondary | ICD-10-CM | POA: Insufficient documentation

## 2014-04-29 DIAGNOSIS — Z7951 Long term (current) use of inhaled steroids: Secondary | ICD-10-CM | POA: Insufficient documentation

## 2014-04-29 DIAGNOSIS — Z79899 Other long term (current) drug therapy: Secondary | ICD-10-CM | POA: Insufficient documentation

## 2014-04-29 DIAGNOSIS — Z794 Long term (current) use of insulin: Secondary | ICD-10-CM | POA: Insufficient documentation

## 2014-04-29 DIAGNOSIS — Z9049 Acquired absence of other specified parts of digestive tract: Secondary | ICD-10-CM | POA: Insufficient documentation

## 2014-04-29 DIAGNOSIS — Z8673 Personal history of transient ischemic attack (TIA), and cerebral infarction without residual deficits: Secondary | ICD-10-CM | POA: Insufficient documentation

## 2014-04-29 DIAGNOSIS — M797 Fibromyalgia: Secondary | ICD-10-CM | POA: Insufficient documentation

## 2014-04-29 DIAGNOSIS — R112 Nausea with vomiting, unspecified: Secondary | ICD-10-CM | POA: Insufficient documentation

## 2014-04-29 DIAGNOSIS — I252 Old myocardial infarction: Secondary | ICD-10-CM | POA: Insufficient documentation

## 2014-04-29 DIAGNOSIS — F419 Anxiety disorder, unspecified: Secondary | ICD-10-CM | POA: Insufficient documentation

## 2014-04-29 DIAGNOSIS — E119 Type 2 diabetes mellitus without complications: Secondary | ICD-10-CM | POA: Insufficient documentation

## 2014-04-29 DIAGNOSIS — I509 Heart failure, unspecified: Secondary | ICD-10-CM | POA: Diagnosis not present

## 2014-04-29 DIAGNOSIS — Z9981 Dependence on supplemental oxygen: Secondary | ICD-10-CM | POA: Diagnosis not present

## 2014-04-29 DIAGNOSIS — I10 Essential (primary) hypertension: Secondary | ICD-10-CM | POA: Diagnosis not present

## 2014-04-29 DIAGNOSIS — F0390 Unspecified dementia without behavioral disturbance: Secondary | ICD-10-CM | POA: Diagnosis not present

## 2014-04-29 DIAGNOSIS — K219 Gastro-esophageal reflux disease without esophagitis: Secondary | ICD-10-CM | POA: Insufficient documentation

## 2014-04-29 DIAGNOSIS — R197 Diarrhea, unspecified: Secondary | ICD-10-CM | POA: Insufficient documentation

## 2014-04-29 DIAGNOSIS — Z87442 Personal history of urinary calculi: Secondary | ICD-10-CM | POA: Diagnosis not present

## 2014-04-29 DIAGNOSIS — G8929 Other chronic pain: Secondary | ICD-10-CM | POA: Insufficient documentation

## 2014-04-29 DIAGNOSIS — Z87891 Personal history of nicotine dependence: Secondary | ICD-10-CM | POA: Diagnosis not present

## 2014-04-29 DIAGNOSIS — Z791 Long term (current) use of non-steroidal anti-inflammatories (NSAID): Secondary | ICD-10-CM | POA: Insufficient documentation

## 2014-04-29 DIAGNOSIS — K259 Gastric ulcer, unspecified as acute or chronic, without hemorrhage or perforation: Secondary | ICD-10-CM | POA: Insufficient documentation

## 2014-04-29 DIAGNOSIS — G4733 Obstructive sleep apnea (adult) (pediatric): Secondary | ICD-10-CM | POA: Diagnosis not present

## 2014-04-29 DIAGNOSIS — R1084 Generalized abdominal pain: Secondary | ICD-10-CM | POA: Insufficient documentation

## 2014-04-29 DIAGNOSIS — Z7902 Long term (current) use of antithrombotics/antiplatelets: Secondary | ICD-10-CM | POA: Diagnosis not present

## 2014-04-29 LAB — COMPREHENSIVE METABOLIC PANEL
ALK PHOS: 53 U/L (ref 39–117)
ALT: 29 U/L (ref 0–53)
AST: 25 U/L (ref 0–37)
Albumin: 4.5 g/dL (ref 3.5–5.2)
Anion gap: 17 — ABNORMAL HIGH (ref 5–15)
BILIRUBIN TOTAL: 0.2 mg/dL — AB (ref 0.3–1.2)
BUN: 13 mg/dL (ref 6–23)
CHLORIDE: 104 meq/L (ref 96–112)
CO2: 21 meq/L (ref 19–32)
Calcium: 8.8 mg/dL (ref 8.4–10.5)
Creatinine, Ser: 0.7 mg/dL (ref 0.50–1.35)
GLUCOSE: 195 mg/dL — AB (ref 70–99)
Potassium: 3.9 mEq/L (ref 3.7–5.3)
SODIUM: 142 meq/L (ref 137–147)
Total Protein: 7.7 g/dL (ref 6.0–8.3)

## 2014-04-29 LAB — CBG MONITORING, ED: GLUCOSE-CAPILLARY: 196 mg/dL — AB (ref 70–99)

## 2014-04-29 LAB — URINALYSIS, ROUTINE W REFLEX MICROSCOPIC
Bilirubin Urine: NEGATIVE
HGB URINE DIPSTICK: NEGATIVE
KETONES UR: 15 mg/dL — AB
Leukocytes, UA: NEGATIVE
Nitrite: NEGATIVE
Protein, ur: NEGATIVE mg/dL
Specific Gravity, Urine: 1.031 — ABNORMAL HIGH (ref 1.005–1.030)
UROBILINOGEN UA: 1 mg/dL (ref 0.0–1.0)
pH: 5 (ref 5.0–8.0)

## 2014-04-29 LAB — CBC WITH DIFFERENTIAL/PLATELET
BASOS ABS: 0 10*3/uL (ref 0.0–0.1)
Basophils Relative: 1 % (ref 0–1)
EOS PCT: 2 % (ref 0–5)
Eosinophils Absolute: 0.1 10*3/uL (ref 0.0–0.7)
HEMATOCRIT: 45.9 % (ref 39.0–52.0)
Hemoglobin: 15.7 g/dL (ref 13.0–17.0)
LYMPHS ABS: 2 10*3/uL (ref 0.7–4.0)
Lymphocytes Relative: 32 % (ref 12–46)
MCH: 30.2 pg (ref 26.0–34.0)
MCHC: 34.2 g/dL (ref 30.0–36.0)
MCV: 88.3 fL (ref 78.0–100.0)
Monocytes Absolute: 0.5 10*3/uL (ref 0.1–1.0)
Monocytes Relative: 7 % (ref 3–12)
NEUTROS ABS: 3.7 10*3/uL (ref 1.7–7.7)
Neutrophils Relative %: 58 % (ref 43–77)
PLATELETS: 201 10*3/uL (ref 150–400)
RBC: 5.2 MIL/uL (ref 4.22–5.81)
RDW: 13.7 % (ref 11.5–15.5)
WBC: 6.3 10*3/uL (ref 4.0–10.5)

## 2014-04-29 LAB — URINE MICROSCOPIC-ADD ON

## 2014-04-29 LAB — LIPASE, BLOOD: LIPASE: 30 U/L (ref 11–59)

## 2014-04-29 MED ORDER — ONDANSETRON HCL 4 MG/2ML IJ SOLN
4.0000 mg | Freq: Once | INTRAMUSCULAR | Status: AC
  Administered 2014-04-29: 4 mg via INTRAVENOUS
  Filled 2014-04-29: qty 2

## 2014-04-29 MED ORDER — SODIUM CHLORIDE 0.9 % IV BOLUS (SEPSIS)
1000.0000 mL | Freq: Once | INTRAVENOUS | Status: AC
  Administered 2014-04-29: 1000 mL via INTRAVENOUS

## 2014-04-29 MED ORDER — ONDANSETRON HCL 4 MG PO TABS: 4.0000 mg | ORAL_TABLET | Freq: Four times a day (QID) | ORAL | Status: DC

## 2014-04-29 NOTE — ED Notes (Signed)
Pt. Resting with eyes closed arouses to voice when RN speaks to pt.  No distress noted.  Pt. Mother at bedside of Pt.

## 2014-04-29 NOTE — ED Provider Notes (Signed)
CSN: [id number]     Arrival date & time 04/29/14  1617 History   First MD Initiated Contact with Patient 04/29/14 1628     Chief Complaint  Patient presents with  . Emesis  . Diarrhea     (Consider location/radiation/quality/duration/timing/severity/associated sxs/prior Treatment) HPI Comments: Patient with a history of Pancreatitis, Cirrhosis, and CVA presents today with nausea, vomiting, and diarrhea.  He reports that this is a chronic problem and he typically gets these symptoms every couple of weeks.  He has had numerous ED visits for the same.  He reports that the diarrhea has been present for the past 3 days and the vomiting began earlier today.  He reports numerous episodes of both vomiting and diarrhea.  He denies any blood in his emesis or blood in his stool.  He has not taken anything for his symptoms prior to arrival.  He denies chest pain, SOB, cough, fever, chills, urinary symptoms, or constipation.  He reports mild generalized abdominal pain, which does not radiate.  No known sick contacts.  He has had a Cholecystectomy, but no other abdominal surgeries.    The history is provided by the patient.    Past Medical History  Diagnosis Date  . Hypertension   . Pancreatitis   . CHF (congestive heart failure)   . 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 Seasonal allergies   . Asthma   . Anxiety   . COPD (chronic obstructive pulmonary disease)   . Hyperlipidemia   . Gastric ulcer   . Fibromyalgia   . Kidney stone   . Myocardial infarction 1995  . Type II diabetes mellitus   . Pneumonia     "several times"  . Chronic bronchitis     "get it q yr"  . OSA (obstructive sleep apnea) 04/24/2013    "use BiPAP"  . GERD (gastroesophageal reflux disease)   . TOIZTIWP(809.9)     "weekly" (12/04/2013)  . Arthritis     "joints"  . Chronic back pain     "mostly lower; have 2 bulging discs in my neck" (12/05/2013)  . Stroke 03/2013    "still weak on the left side"  (12/05/2013)  . Stroke 12/04/2013    "came in today w/right sided weakness" (12/04/2013)  . Dementia     onset of early  . RMS[facility] spotted fever) 11/22/2012   Past Surgical History  Procedure Laterality Date  . Cholecystectomy    . Multiple cysts removal-hip,wrist    . Upper gastrointestinal endoscopy    . Colonoscopy    . Tonsillectomy  ~ 1985  . Cardiac catheterization    . Cystoscopy w/ stone manipulation     Family History  Problem Relation Age of Onset  . Colon cancer Neg Hx   . Esophageal cancer Neg Hx   . Rectal cancer Neg Hx   . Skin cancer Father   . Lung cancer Paternal Grandfather   . Stomach cancer Maternal Grandfather   . Bone cancer Paternal Grandmother   . Diabetes Mother   . Diabetes Maternal Grandmother   . Heart disease Maternal Grandmother   . Heart attack Father   . Heart disease Paternal Grandfather   . Heart attack Paternal Grandmother   . Hypertension Mother   . Arthritis Mother   . Other Mother     brain stem vertigo  . Other Mother     disorder of pancreas  . Stomach cancer Father   . Hypertension Father  History  Substance Use Topics  . Smoking status: Former Smoker -- 2.00 packs/day for 21 years    Types: Cigarettes    Quit date: 05/23/2002  . Smokeless tobacco: Never Used  . Alcohol Use: No    Review of Systems  All other systems reviewed and are negative.     Allergies  Actos; Aricept; and Codeine  Home Medications   Prior to Admission medications   Medication Sig Start Date End Date Taking? Authorizing Provider  albuterol (PROVENTIL HFA;VENTOLIN HFA) 108 (90 BASE) MCG/ACT inhaler Inhale 2 puffs into the lungs every 6 (six) hours as needed. For shortness of breath     Historical Provider, MD  citalopram (CELEXA) 40 MG tablet Take 40 mg by mouth daily.      Historical Provider, MD  clopidogrel (PLAVIX) 75 MG tablet Take 75 mg by mouth daily.    Historical Provider, MD  cyanocobalamin (,VITAMIN B-12,) 1000 MCG/ML  injection Inject 1,000 mcg into the muscle once a week.     Historical Provider, MD  fluticasone (FLONASE) 50 MCG/ACT nasal spray Place 2 sprays into both nostrils daily.    Historical Provider, MD  furosemide (LASIX) 40 MG tablet Take 1 tablet (40 mg total) by mouth daily. 12/05/13   Geradine Girt, DO  gabapentin (NEURONTIN) 600 MG tablet Take 1,200 mg by mouth 3 (three) times daily.    Historical Provider, MD  HYDROcodone-acetaminophen (NORCO) 10-325 MG per tablet Take 1 tablet by mouth every 6 (six) hours.    Historical Provider, MD  insulin detemir (LEVEMIR) 100 UNIT/ML injection Inject 60 Units into the skin at bedtime.     Historical Provider, MD  insulin lispro protamine-lispro (HUMALOG 50/50 MIX) (50-50) 100 UNIT/ML SUSP injection Inject into the skin 2 (two) times daily before a meal.    Historical Provider, MD  lisinopril (PRINIVIL,ZESTRIL) 40 MG tablet Take 40 mg by mouth daily.     Historical Provider, MD  LORazepam (ATIVAN) 1 MG tablet Take 1 mg by mouth 2 (two) times daily.      Historical Provider, MD  Memantine HCl (NAMENDA PO) Take 1 tablet by mouth at bedtime.     Historical Provider, MD  omeprazole (PRILOSEC) 20 MG capsule Take 20 mg by mouth at bedtime.     Historical Provider, MD  ondansetron (ZOFRAN ODT) 4 MG disintegrating tablet Take 1 tablet (4 mg total) by mouth every 8 (eight) hours as needed for nausea or vomiting. 02/11/14   Kristen N Ward, DO  ondansetron (ZOFRAN) 4 MG tablet Take 1 tablet (4 mg total) by mouth every 8 (eight) hours as needed for nausea or vomiting. 12/05/13   Geradine Girt, DO  ondansetron (ZOFRAN) 4 MG tablet Take 1 tablet (4 mg total) by mouth every 6 (six) hours. 04/17/14   Britt Bottom, NP  Potassium Gluconate 595 MG CAPS Take 595 mg by mouth daily.     Historical Provider, MD  prochlorperazine (COMPAZINE) 25 MG suppository Place 1 suppository (25 mg total) rectally every 12 (twelve) hours as needed for nausea or vomiting. 01/10/14   Gatha Mayer, MD  promethazine (PHENERGAN) 25 MG tablet Take 1 tablet (25 mg total) by mouth every 6 (six) hours as needed for nausea or vomiting. 02/11/14   Kristen N Ward, DO  tiotropium (SPIRIVA) 18 MCG inhalation capsule Place 1 capsule (18 mcg total) into inhaler and inhale daily as needed (wheezing). 12/05/13   Geradine Girt, DO  traMADol (ULTRAM) 50 MG tablet Take 50  mg by mouth 2 (two) times daily.    Historical Provider, MD   Temp(Src) 98.7 F (37.1 C) (Axillary) Physical Exam  Constitutional: He appears well-developed and well-nourished.  HENT:  Head: Normocephalic and atraumatic.  Mouth/Throat: Oropharynx is clear and moist.  Neck: Normal range of motion. Neck supple.  Cardiovascular: Normal rate, regular rhythm and normal heart sounds.   Pulmonary/Chest: Effort normal and breath sounds normal.  Abdominal: Soft. Bowel sounds are normal. He exhibits no distension and no mass. There is generalized tenderness. There is no rebound and no guarding.  Musculoskeletal: Normal range of motion.  Neurological: He is alert.  Skin: Skin is warm and dry.  Psychiatric: He has a normal mood and affect.  Nursing note and vitals reviewed.   ED Course  Procedures (including critical care time) Labs Review Labs Reviewed  CBG MONITORING, ED - Abnormal; Notable for the following:    Glucose-Capillary 196 (*)    All other components within normal limits    Imaging Review No results found.   EKG Interpretation None     8:24 PM Reassessed patient.  Nausea has improved.  Patient tolerating PO liquids.  Abdomen soft with very mild diffuse tenderness to palpation.   MDM   Final diagnoses:  None   Patient with a history of Pancreatitis, Cirrhosis, and CVA presents today with nausea, vomiting, and diarrhea.  He has had numerous ED visits for the same.  He is afebrile.  Labs unremarkable.  On exam today the abdomen is soft, not distended, and only mild generalized tenderness to palpation.  No  rebound or guarding.  Therefore, do not feel that imaging is indicated at this time.  No vomiting during ED course.  Pain and nausea improved in the ED.  He is tolerating PO liquids.  Feel that the patient is stable for discharge.  Patient instructed to follow up with GI due to the fact that he is getting these symptoms so frequently.  Discharged home with Zofran.  Return precautions given.      Hyman Bible, PA-C 05/01/14 [place], MD 05/02/14 [phone]

## 2014-04-29 NOTE — ED Notes (Signed)
Patient drove here because he was feeling weak, with N/V/D. Patient reports that he was feeling bad yesterday. He also reports that this is normal about once a week.

## 2014-04-29 NOTE — ED Notes (Signed)
CBG was 196

## 2014-05-07 ENCOUNTER — Emergency Department (HOSPITAL_BASED_OUTPATIENT_CLINIC_OR_DEPARTMENT_OTHER)
Admission: EM | Admit: 2014-05-07 | Discharge: 2014-05-07 | Disposition: A | Discharge status: Home / Self Care | Payer: Medicaid Other | Attending: Emergency Medicine | Admitting: Emergency Medicine

## 2014-05-07 ENCOUNTER — Encounter (HOSPITAL_BASED_OUTPATIENT_CLINIC_OR_DEPARTMENT_OTHER): Payer: Self-pay

## 2014-05-07 DIAGNOSIS — R1110 Vomiting, unspecified: Secondary | ICD-10-CM

## 2014-05-07 DIAGNOSIS — Z9049 Acquired absence of other specified parts of digestive tract: Secondary | ICD-10-CM | POA: Diagnosis not present

## 2014-05-07 DIAGNOSIS — Z8673 Personal history of transient ischemic attack (TIA), and cerebral infarction without residual deficits: Secondary | ICD-10-CM | POA: Diagnosis not present

## 2014-05-07 DIAGNOSIS — M1990 Unspecified osteoarthritis, unspecified site: Secondary | ICD-10-CM | POA: Insufficient documentation

## 2014-05-07 DIAGNOSIS — I10 Essential (primary) hypertension: Secondary | ICD-10-CM | POA: Insufficient documentation

## 2014-05-07 DIAGNOSIS — K219 Gastro-esophageal reflux disease without esophagitis: Secondary | ICD-10-CM | POA: Insufficient documentation

## 2014-05-07 DIAGNOSIS — E669 Obesity, unspecified: Secondary | ICD-10-CM | POA: Diagnosis not present

## 2014-05-07 DIAGNOSIS — F419 Anxiety disorder, unspecified: Secondary | ICD-10-CM | POA: Diagnosis not present

## 2014-05-07 DIAGNOSIS — G4733 Obstructive sleep apnea (adult) (pediatric): Secondary | ICD-10-CM | POA: Diagnosis not present

## 2014-05-07 DIAGNOSIS — G8929 Other chronic pain: Secondary | ICD-10-CM | POA: Insufficient documentation

## 2014-05-07 DIAGNOSIS — Z8701 Personal history of pneumonia (recurrent): Secondary | ICD-10-CM | POA: Diagnosis not present

## 2014-05-07 DIAGNOSIS — Z87891 Personal history of nicotine dependence: Secondary | ICD-10-CM | POA: Diagnosis not present

## 2014-05-07 DIAGNOSIS — E119 Type 2 diabetes mellitus without complications: Secondary | ICD-10-CM | POA: Insufficient documentation

## 2014-05-07 DIAGNOSIS — Z9889 Other specified postprocedural states: Secondary | ICD-10-CM | POA: Insufficient documentation

## 2014-05-07 DIAGNOSIS — Z794 Long term (current) use of insulin: Secondary | ICD-10-CM | POA: Insufficient documentation

## 2014-05-07 DIAGNOSIS — Z7902 Long term (current) use of antithrombotics/antiplatelets: Secondary | ICD-10-CM | POA: Insufficient documentation

## 2014-05-07 DIAGNOSIS — J449 Chronic obstructive pulmonary disease, unspecified: Secondary | ICD-10-CM | POA: Diagnosis not present

## 2014-05-07 DIAGNOSIS — I252 Old myocardial infarction: Secondary | ICD-10-CM | POA: Diagnosis not present

## 2014-05-07 DIAGNOSIS — Z87442 Personal history of urinary calculi: Secondary | ICD-10-CM | POA: Insufficient documentation

## 2014-05-07 DIAGNOSIS — F0390 Unspecified dementia without behavioral disturbance: Secondary | ICD-10-CM | POA: Insufficient documentation

## 2014-05-07 DIAGNOSIS — Z7951 Long term (current) use of inhaled steroids: Secondary | ICD-10-CM | POA: Insufficient documentation

## 2014-05-07 DIAGNOSIS — Z8619 Personal history of other infectious and parasitic diseases: Secondary | ICD-10-CM | POA: Insufficient documentation

## 2014-05-07 DIAGNOSIS — I509 Heart failure, unspecified: Secondary | ICD-10-CM | POA: Diagnosis not present

## 2014-05-07 DIAGNOSIS — R112 Nausea with vomiting, unspecified: Secondary | ICD-10-CM | POA: Diagnosis not present

## 2014-05-07 LAB — COMPREHENSIVE METABOLIC PANEL
ALT: 27 U/L (ref 0–53)
ANION GAP: 14 (ref 5–15)
AST: 21 U/L (ref 0–37)
Albumin: 4.1 g/dL (ref 3.5–5.2)
Alkaline Phosphatase: 47 U/L (ref 39–117)
BUN: 13 mg/dL (ref 6–23)
CO2: 26 mEq/L (ref 19–32)
CREATININE: 0.7 mg/dL (ref 0.50–1.35)
Calcium: 8.6 mg/dL (ref 8.4–10.5)
Chloride: 103 mEq/L (ref 96–112)
GFR calc Af Amer: >90 mL/min (ref >90)
Glucose, Bld: 138 mg/dL — ABNORMAL HIGH (ref 70–99)
Potassium: 4 mEq/L (ref 3.7–5.3)
Sodium: 143 mEq/L (ref 137–147)
TOTAL PROTEIN: 7.1 g/dL (ref 6.0–8.3)
Total Bilirubin: 0.2 mg/dL — ABNORMAL LOW (ref 0.3–1.2)

## 2014-05-07 LAB — CBC WITH DIFFERENTIAL/PLATELET
Basophils Absolute: 0.1 10*3/uL (ref 0.0–0.1)
Basophils Relative: 1 % (ref 0–1)
EOS ABS: 0.1 10*3/uL (ref 0.0–0.7)
Eosinophils Relative: 2 % (ref 0–5)
HCT: 45.2 % (ref 39.0–52.0)
Hemoglobin: 15.5 g/dL (ref 13.0–17.0)
LYMPHS ABS: 2.2 10*3/uL (ref 0.7–4.0)
LYMPHS PCT: 42 % (ref 12–46)
MCH: 30.5 pg (ref 26.0–34.0)
MCHC: 34.3 g/dL (ref 30.0–36.0)
MCV: 88.8 fL (ref 78.0–100.0)
Monocytes Absolute: 0.5 10*3/uL (ref 0.1–1.0)
Monocytes Relative: 8 % (ref 3–12)
NEUTROS PCT: 47 % (ref 43–77)
Neutro Abs: 2.5 10*3/uL (ref 1.7–7.7)
Platelets: 193 10*3/uL (ref 150–400)
RBC: 5.09 MIL/uL (ref 4.22–5.81)
RDW: 13.6 % (ref 11.5–15.5)
WBC: 5.4 10*3/uL (ref 4.0–10.5)

## 2014-05-07 LAB — LIPASE, BLOOD: Lipase: 29 U/L (ref 11–59)

## 2014-05-07 MED ORDER — KETOROLAC TROMETHAMINE 30 MG/ML IJ SOLN
30.0000 mg | Freq: Once | INTRAMUSCULAR | Status: AC
  Administered 2014-05-07: 30 mg via INTRAVENOUS
  Filled 2014-05-07: qty 1

## 2014-05-07 MED ORDER — SODIUM CHLORIDE 0.9 % IV BOLUS (SEPSIS)
1000.0000 mL | Freq: Once | INTRAVENOUS | Status: AC
  Administered 2014-05-07: 1000 mL via INTRAVENOUS

## 2014-05-07 MED ORDER — KETOROLAC TROMETHAMINE 30 MG/ML IJ SOLN: 30.0000 mg | Freq: Once | INTRAMUSCULAR | Status: AC

## 2014-05-07 MED ORDER — ONDANSETRON HCL 4 MG/2ML IJ SOLN
4.0000 mg | Freq: Once | INTRAMUSCULAR | Status: AC
  Administered 2014-05-07: 4 mg via INTRAVENOUS
  Filled 2014-05-07: qty 2

## 2014-05-07 MED ORDER — ONDANSETRON HCL 4 MG/2ML IJ SOLN
INTRAMUSCULAR | Status: AC
  Administered 2014-05-07: 4 mg
  Filled 2014-05-07: qty 2

## 2014-05-07 NOTE — ED Notes (Signed)
Sts that he knows he is dehydrated, reports needing fluid every 8-10 days. Sts "they usually give me fluid and I feel better, but I know to come when I start throwing up."

## 2014-05-07 NOTE — ED Notes (Signed)
Sprite zero at bedside for PO intake

## 2014-05-07 NOTE — ED Notes (Signed)
C/o vomiting x 2 days-pt states he is dehydrated with hx of same approx every 10 days

## 2014-05-07 NOTE — ED Notes (Signed)
MD at bedside. 

## 2014-05-07 NOTE — ED Provider Notes (Signed)
CSN: [id number]     Arrival date & time 05/07/14  1457 History   First MD Initiated Contact with Patient 05/07/14 1536     Chief Complaint  Patient presents with  . Emesis     (Consider location/radiation/quality/duration/timing/severity/associated sxs/prior Treatment) HPI  This is a 53 year old male with a history of pancreatitis and nausea and vomiting a presents today with nausea and vomiting. He has chronic abdominal pain states it does not worsen his baseline. His nausea and vomiting have occurred 5 times since yesterday. He states he is here to try to head off his dehydration that he usually gets with this. He denies that he has had anything present in his emesis besides the food he has been taking. He has been eating and drinking as per usual. He is taking Zofran at home for his symptoms. He denies headache, chest pain, change in abdominal pain, change in urination, or alcohol intake.  Past Medical History  Diagnosis Date  . Hypertension   . Pancreatitis   . CHF (congestive heart failure)   . 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 Seasonal allergies   . Asthma   . Anxiety   . COPD (chronic obstructive pulmonary disease)   . Hyperlipidemia   . Gastric ulcer   . Fibromyalgia   . Kidney stone   . Myocardial infarction 1995  . Type II diabetes mellitus   . Pneumonia     "several times"  . Chronic bronchitis     "get it q yr"  . OSA (obstructive sleep apnea) 04/24/2013    "use BiPAP"  . GERD (gastroesophageal reflux disease)   . YQIHKVQQ(595.6)     "weekly" (12/04/2013)  . Arthritis     "joints"  . Chronic back pain     "mostly lower; have 2 bulging discs in my neck" (12/05/2013)  . Stroke 03/2013    "still weak on the left side" (12/05/2013)  . Stroke 12/04/2013    "came in today w/right sided weakness" (12/04/2013)  . Dementia     onset of early  . [facility] spotted fever) 11/22/2012   Past Surgical History  Procedure Laterality Date  .  Cholecystectomy    . Multiple cysts removal-hip,wrist    . Upper gastrointestinal endoscopy    . Colonoscopy    . Tonsillectomy  ~ 1985  . Cardiac catheterization    . Cystoscopy w/ stone manipulation     Family History  Problem Relation Age of Onset  . Colon cancer Neg Hx   . Esophageal cancer Neg Hx   . Rectal cancer Neg Hx   . Skin cancer Father   . Lung cancer Paternal Grandfather   . Stomach cancer Maternal Grandfather   . Bone cancer Paternal Grandmother   . Diabetes Mother   . Diabetes Maternal Grandmother   . Heart disease Maternal Grandmother   . Heart attack Father   . Heart disease Paternal Grandfather   . Heart attack Paternal Grandmother   . Hypertension Mother   . Arthritis Mother   . Other Mother     brain stem vertigo  . Other Mother     disorder of pancreas  . Stomach cancer Father   . Hypertension Father    History  Substance Use Topics  . Smoking status: Former Smoker -- 2.00 packs/day for 21 years    Types: Cigarettes    Quit date: 05/23/2002  . Smokeless tobacco: Never Used  . Alcohol Use: No  Review of Systems  All other systems reviewed and are negative.     Allergies  Actos; Aricept; and Codeine  Home Medications   Prior to Admission medications   Medication Sig Start Date End Date Taking? Authorizing Provider  albuterol (PROVENTIL HFA;VENTOLIN HFA) 108 (90 BASE) MCG/ACT inhaler Inhale 2 puffs into the lungs every 6 (six) hours as needed. For shortness of breath     Historical Provider, MD  citalopram (CELEXA) 40 MG tablet Take 40 mg by mouth daily.      Historical Provider, MD  clopidogrel (PLAVIX) 75 MG tablet Take 75 mg by mouth daily.    Historical Provider, MD  cyanocobalamin (,VITAMIN B-12,) 1000 MCG/ML injection Inject 1,000 mcg into the muscle once a week.     Historical Provider, MD  fluticasone (FLONASE) 50 MCG/ACT nasal spray Place 2 sprays into both nostrils daily.    Historical Provider, MD  furosemide (LASIX) 40 MG  tablet Take 1 tablet (40 mg total) by mouth daily. 12/05/13   Geradine Girt, DO  gabapentin (NEURONTIN) 600 MG tablet Take 1,200 mg by mouth 3 (three) times daily.    Historical Provider, MD  HYDROcodone-acetaminophen (NORCO) 10-325 MG per tablet Take 1 tablet by mouth every 6 (six) hours.    Historical Provider, MD  insulin detemir (LEVEMIR) 100 UNIT/ML injection Inject 60 Units into the skin at bedtime.     Historical Provider, MD  insulin lispro protamine-lispro (HUMALOG 50/50 MIX) (50-50) 100 UNIT/ML SUSP injection Inject into the skin 2 (two) times daily before a meal.    Historical Provider, MD  lisinopril (PRINIVIL,ZESTRIL) 40 MG tablet Take 40 mg by mouth daily.     Historical Provider, MD  LORazepam (ATIVAN) 1 MG tablet Take 1 mg by mouth 2 (two) times daily.      Historical Provider, MD  Memantine HCl (NAMENDA PO) Take 1 tablet by mouth at bedtime.     Historical Provider, MD  omeprazole (PRILOSEC) 20 MG capsule Take 20 mg by mouth at bedtime.     Historical Provider, MD  ondansetron (ZOFRAN ODT) 4 MG disintegrating tablet Take 1 tablet (4 mg total) by mouth every 8 (eight) hours as needed for nausea or vomiting. 02/11/14   Kristen N Ward, DO  ondansetron (ZOFRAN) 4 MG tablet Take 1 tablet (4 mg total) by mouth every 8 (eight) hours as needed for nausea or vomiting. 12/05/13   Geradine Girt, DO  ondansetron (ZOFRAN) 4 MG tablet Take 1 tablet (4 mg total) by mouth every 6 (six) hours. 04/29/14   Hyman Bible, PA-C  Potassium Gluconate 595 MG CAPS Take 595 mg by mouth daily.     Historical Provider, MD  prochlorperazine (COMPAZINE) 25 MG suppository Place 1 suppository (25 mg total) rectally every 12 (twelve) hours as needed for nausea or vomiting. 01/10/14   Gatha Mayer, MD  promethazine (PHENERGAN) 25 MG tablet Take 1 tablet (25 mg total) by mouth every 6 (six) hours as needed for nausea or vomiting. 02/11/14   Kristen N Ward, DO  tiotropium (SPIRIVA) 18 MCG inhalation capsule Place 1  capsule (18 mcg total) into inhaler and inhale daily as needed (wheezing). 12/05/13   Geradine Girt, DO  traMADol (ULTRAM) 50 MG tablet Take 50 mg by mouth 2 (two) times daily.    Historical Provider, MD   BP 103/56 mmHg  Pulse 78  Temp(Src) 98.6 F (37 C) (Oral)  Resp 18  Ht 5' 10"  (1.778 m)  Wt 275 lb (124.739  kg)  BMI 39.46 kg/m2  SpO2 97% Physical Exam  Constitutional: He is oriented to person, place, and time. He appears well-developed and well-nourished.  Obese  HENT:  Head: Normocephalic and atraumatic.  Right Ear: External ear normal.  Left Ear: External ear normal.  Nose: Nose normal.  Mouth/Throat: Oropharynx is clear and moist.  Eyes: Conjunctivae and EOM are normal. Pupils are equal, round, and reactive to light.  Neck: Normal range of motion. Neck supple.  Cardiovascular: Normal rate, regular rhythm, normal heart sounds and intact distal pulses.   Pulmonary/Chest: Effort normal and breath sounds normal. No respiratory distress. He has no wheezes. He exhibits no tenderness.  Abdominal: Soft. Bowel sounds are normal. He exhibits no distension and no mass. There is no tenderness. There is no guarding.  Musculoskeletal: Normal range of motion.  Neurological: He is alert and oriented to person, place, and time. He has normal reflexes. He exhibits normal muscle tone. Coordination normal.  Skin: Skin is warm and dry.  Psychiatric: He has a normal mood and affect. His behavior is normal. Judgment and thought content normal.  Nursing note and vitals reviewed.   ED Course  Procedures (including critical care time) Labs Review Labs Reviewed  COMPREHENSIVE METABOLIC PANEL - Abnormal; Notable for the following:    Glucose, Bld 138 (*)    Total Bilirubin 0.2 (*)    All other components within normal limits  LIPASE, BLOOD  CBC WITH DIFFERENTIAL  CBC WITH DIFFERENTIAL    Ima  Final diagnoses:  Intractable vomiting with nausea, vomiting of unspecified type    Patient  feels improved and vital signs are normal with normal labs. He is tolerating by mouth fluid.    Shaune Pollack, MD 05/07/14 [phone]

## 2014-05-07 NOTE — Discharge Instructions (Signed)

## 2014-05-16 ENCOUNTER — Emergency Department (HOSPITAL_BASED_OUTPATIENT_CLINIC_OR_DEPARTMENT_OTHER): Payer: Medicaid Other

## 2014-05-16 ENCOUNTER — Emergency Department (HOSPITAL_BASED_OUTPATIENT_CLINIC_OR_DEPARTMENT_OTHER)
Admission: EM | Admit: 2014-05-16 | Discharge: 2014-05-16 | Disposition: A | Discharge status: Home / Self Care | Payer: Medicaid Other | Attending: Emergency Medicine | Admitting: Emergency Medicine

## 2014-05-16 ENCOUNTER — Encounter (HOSPITAL_BASED_OUTPATIENT_CLINIC_OR_DEPARTMENT_OTHER): Payer: Self-pay | Admitting: *Deleted

## 2014-05-16 DIAGNOSIS — R0602 Shortness of breath: Secondary | ICD-10-CM | POA: Insufficient documentation

## 2014-05-16 DIAGNOSIS — F0390 Unspecified dementia without behavioral disturbance: Secondary | ICD-10-CM | POA: Diagnosis not present

## 2014-05-16 DIAGNOSIS — M549 Dorsalgia, unspecified: Secondary | ICD-10-CM | POA: Diagnosis not present

## 2014-05-16 DIAGNOSIS — G4733 Obstructive sleep apnea (adult) (pediatric): Secondary | ICD-10-CM | POA: Insufficient documentation

## 2014-05-16 DIAGNOSIS — R002 Palpitations: Secondary | ICD-10-CM | POA: Diagnosis not present

## 2014-05-16 DIAGNOSIS — J189 Pneumonia, unspecified organism: Secondary | ICD-10-CM | POA: Insufficient documentation

## 2014-05-16 DIAGNOSIS — Z87891 Personal history of nicotine dependence: Secondary | ICD-10-CM | POA: Insufficient documentation

## 2014-05-16 DIAGNOSIS — F419 Anxiety disorder, unspecified: Secondary | ICD-10-CM | POA: Diagnosis not present

## 2014-05-16 DIAGNOSIS — I509 Heart failure, unspecified: Secondary | ICD-10-CM | POA: Diagnosis not present

## 2014-05-16 DIAGNOSIS — I10 Essential (primary) hypertension: Secondary | ICD-10-CM | POA: Diagnosis not present

## 2014-05-16 DIAGNOSIS — R2240 Localized swelling, mass and lump, unspecified lower limb: Secondary | ICD-10-CM | POA: Diagnosis not present

## 2014-05-16 DIAGNOSIS — Z7951 Long term (current) use of inhaled steroids: Secondary | ICD-10-CM | POA: Diagnosis not present

## 2014-05-16 DIAGNOSIS — Z9981 Dependence on supplemental oxygen: Secondary | ICD-10-CM | POA: Diagnosis not present

## 2014-05-16 DIAGNOSIS — G8929 Other chronic pain: Secondary | ICD-10-CM | POA: Diagnosis not present

## 2014-05-16 DIAGNOSIS — R110 Nausea: Secondary | ICD-10-CM

## 2014-05-16 DIAGNOSIS — I252 Old myocardial infarction: Secondary | ICD-10-CM | POA: Diagnosis not present

## 2014-05-16 DIAGNOSIS — Z87442 Personal history of urinary calculi: Secondary | ICD-10-CM | POA: Insufficient documentation

## 2014-05-16 DIAGNOSIS — Z8673 Personal history of transient ischemic attack (TIA), and cerebral infarction without residual deficits: Secondary | ICD-10-CM | POA: Insufficient documentation

## 2014-05-16 DIAGNOSIS — Z79899 Other long term (current) drug therapy: Secondary | ICD-10-CM | POA: Insufficient documentation

## 2014-05-16 DIAGNOSIS — E119 Type 2 diabetes mellitus without complications: Secondary | ICD-10-CM | POA: Insufficient documentation

## 2014-05-16 DIAGNOSIS — J441 Chronic obstructive pulmonary disease with (acute) exacerbation: Secondary | ICD-10-CM | POA: Insufficient documentation

## 2014-05-16 DIAGNOSIS — Z794 Long term (current) use of insulin: Secondary | ICD-10-CM | POA: Diagnosis not present

## 2014-05-16 DIAGNOSIS — M1990 Unspecified osteoarthritis, unspecified site: Secondary | ICD-10-CM | POA: Diagnosis not present

## 2014-05-16 DIAGNOSIS — Z7902 Long term (current) use of antithrombotics/antiplatelets: Secondary | ICD-10-CM | POA: Diagnosis not present

## 2014-05-16 DIAGNOSIS — R112 Nausea with vomiting, unspecified: Secondary | ICD-10-CM | POA: Diagnosis present

## 2014-05-16 DIAGNOSIS — Z8619 Personal history of other infectious and parasitic diseases: Secondary | ICD-10-CM | POA: Diagnosis not present

## 2014-05-16 DIAGNOSIS — K219 Gastro-esophageal reflux disease without esophagitis: Secondary | ICD-10-CM | POA: Insufficient documentation

## 2014-05-16 LAB — CBC
HCT: 41.6 % (ref 39.0–52.0)
Hemoglobin: 14.3 g/dL (ref 13.0–17.0)
MCH: 30.8 pg (ref 26.0–34.0)
MCHC: 34.4 g/dL (ref 30.0–36.0)
MCV: 89.5 fL (ref 78.0–100.0)
PLATELETS: 170 10*3/uL (ref 150–400)
RBC: 4.65 MIL/uL (ref 4.22–5.81)
RDW: 13.2 % (ref 11.5–15.5)
WBC: 4.7 10*3/uL (ref 4.0–10.5)

## 2014-05-16 LAB — COMPREHENSIVE METABOLIC PANEL
ALBUMIN: 4.3 g/dL (ref 3.5–5.2)
ALK PHOS: 40 U/L (ref 39–117)
ALT: 23 U/L (ref 0–53)
AST: 21 U/L (ref 0–37)
Anion gap: 5 (ref 5–15)
BUN: 16 mg/dL (ref 6–23)
CHLORIDE: 108 meq/L (ref 96–112)
CO2: 28 mmol/L (ref 19–32)
CREATININE: 0.56 mg/dL (ref 0.50–1.35)
Calcium: 8.9 mg/dL (ref 8.4–10.5)
GFR calc Af Amer: >90 mL/min (ref >90)
GFR calc non Af Amer: >90 mL/min (ref >90)
Glucose, Bld: 101 mg/dL — ABNORMAL HIGH (ref 70–99)
POTASSIUM: 3.8 mmol/L (ref 3.5–5.1)
Sodium: 141 mmol/L (ref 135–145)
Total Bilirubin: 0.5 mg/dL (ref 0.3–1.2)
Total Protein: 6.7 g/dL (ref 6.0–8.3)

## 2014-05-16 LAB — BRAIN NATRIURETIC PEPTIDE: B Natriuretic Peptide: 14.1 pg/mL (ref 0.0–100.0)

## 2014-05-16 LAB — LIPASE, BLOOD: LIPASE: 35 U/L (ref 11–59)

## 2014-05-16 LAB — TROPONIN I: Troponin I: <0.03 ng/mL (ref <0.031)

## 2014-05-16 MED ORDER — KETOROLAC TROMETHAMINE 30 MG/ML IJ SOLN
30.0000 mg | Freq: Once | INTRAMUSCULAR | Status: AC
  Administered 2014-05-16: 30 mg via INTRAVENOUS
  Filled 2014-05-16: qty 1

## 2014-05-16 MED ORDER — ONDANSETRON HCL 4 MG/2ML IJ SOLN
4.0000 mg | Freq: Once | INTRAMUSCULAR | Status: AC
  Administered 2014-05-16: 4 mg via INTRAVENOUS
  Filled 2014-05-16: qty 2

## 2014-05-16 NOTE — ED Notes (Signed)
Pt reports headache and n/v since Tuesday- Also reports intermittent confusion, congestion, cough, and right lower rib pain

## 2014-05-16 NOTE — Discharge Instructions (Signed)
Return to the ED with any concerns including difficulty breathing, chest pain, vomiting and not able to keep down liquids, decreased level of alertness/lethargy, or any other alarming symptoms

## 2014-05-16 NOTE — ED Provider Notes (Signed)
CSN: [id number]     Arrival date & time 05/16/14  1749 History  This chart was scribed for Threasa Beards, MD by Jeanell Sparrow, ED Scribe. This patient was seen in room MH12/MH12 and the patient's care was started at 7:06 PM.   Chief Complaint  Patient presents with  . Emesis   Patient is a 53 y.o. male presenting with vomiting. The history is provided by the patient. No language interpreter was used.  Emesis Severity:  Moderate Duration:  4 days Timing:  Intermittent Progression:  Improving Chronicity:  New Context: not self-induced   Relieved by:  None tried Worsened by:  Nothing tried Ineffective treatments:  None tried  HPI Comments: Bryan Becker is a 53 y.o. male who presents to the Emergency Department complaining of intermittent moderate emesis that started 3 days ago. He reports that he has been having some nausea and SOB, which are the main reasons why he is here. He reports that he has been taking nausea medication with mild relief. He states that he has also been having some heart palpitations, leg swelling, back pain, and a non productive cough.  Pt has hx of multiple episodes of needing IV fluids to keep from getting dehydrated due to episodes of emesis.  Today he is concerned that he may be fluid overloaded as he feels bloated and short of breath.  He is also requesting something for his headache.   Past Medical History  Diagnosis Date  . Hypertension   . Pancreatitis   . CHF (congestive heart failure)   . 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 Seasonal allergies   . Asthma   . Anxiety   . COPD (chronic obstructive pulmonary disease)   . Hyperlipidemia   . Gastric ulcer   . Fibromyalgia   . Kidney stone   . Myocardial infarction 1995  . Type II diabetes mellitus   . Pneumonia     "several times"  . Chronic bronchitis     "get it q yr"  . OSA (obstructive sleep apnea) 04/24/2013    "use BiPAP"  . GERD (gastroesophageal reflux disease)   .  GGYIRSWN(462.7)     "weekly" (12/04/2013)  . Arthritis     "joints"  . Chronic back pain     "mostly lower; have 2 bulging discs in my neck" (12/05/2013)  . Stroke 03/2013    "still weak on the left side" (12/05/2013)  . Stroke 12/04/2013    "came in today w/right sided weakness" (12/04/2013)  . Dementia     onset of early  . [facility] spotted fever) 11/22/2012   Past Surgical History  Procedure Laterality Date  . Cholecystectomy    . Multiple cysts removal-hip,wrist    . Upper gastrointestinal endoscopy    . Colonoscopy    . Tonsillectomy  ~ 1985  . Cardiac catheterization    . Cystoscopy w/ stone manipulation     Family History  Problem Relation Age of Onset  . Colon cancer Neg Hx   . Esophageal cancer Neg Hx   . Rectal cancer Neg Hx   . Skin cancer Father   . Lung cancer Paternal Grandfather   . Stomach cancer Maternal Grandfather   . Bone cancer Paternal Grandmother   . Diabetes Mother   . Diabetes Maternal Grandmother   . Heart disease Maternal Grandmother   . Heart attack Father   . Heart disease Paternal Grandfather   . Heart attack Paternal Grandmother   .  Hypertension Mother   . Arthritis Mother   . Other Mother     brain stem vertigo  . Other Mother     disorder of pancreas  . Stomach cancer Father   . Hypertension Father    History  Substance Use Topics  . Smoking status: Former Smoker -- 2.00 packs/day for 21 years    Types: Cigarettes    Quit date: 05/23/2002  . Smokeless tobacco: Never Used  . Alcohol Use: No    Review of Systems  Respiratory: Positive for cough and shortness of breath.   Cardiovascular: Positive for palpitations and leg swelling.  Gastrointestinal: Positive for nausea and vomiting.  Musculoskeletal: Positive for back pain.  All other systems reviewed and are negative.   Allergies  Actos; Aricept; and Codeine  Home Medications   Prior to Admission medications   Medication Sig Start Date End Date Taking?  Authorizing Provider  albuterol (PROVENTIL HFA;VENTOLIN HFA) 108 (90 BASE) MCG/ACT inhaler Inhale 2 puffs into the lungs every 6 (six) hours as needed. For shortness of breath     Historical Provider, MD  citalopram (CELEXA) 40 MG tablet Take 40 mg by mouth daily.      Historical Provider, MD  clopidogrel (PLAVIX) 75 MG tablet Take 75 mg by mouth daily.    Historical Provider, MD  cyanocobalamin (,VITAMIN B-12,) 1000 MCG/ML injection Inject 1,000 mcg into the muscle once a week.     Historical Provider, MD  fluticasone (FLONASE) 50 MCG/ACT nasal spray Place 2 sprays into both nostrils daily.    Historical Provider, MD  furosemide (LASIX) 40 MG tablet Take 1 tablet (40 mg total) by mouth daily. 12/05/13   Geradine Girt, DO  gabapentin (NEURONTIN) 600 MG tablet Take 1,200 mg by mouth 3 (three) times daily.    Historical Provider, MD  HYDROcodone-acetaminophen (NORCO) 10-325 MG per tablet Take 1 tablet by mouth every 6 (six) hours.    Historical Provider, MD  insulin detemir (LEVEMIR) 100 UNIT/ML injection Inject 60 Units into the skin at bedtime.     Historical Provider, MD  insulin lispro protamine-lispro (HUMALOG 50/50 MIX) (50-50) 100 UNIT/ML SUSP injection Inject into the skin 2 (two) times daily before a meal.    Historical Provider, MD  lisinopril (PRINIVIL,ZESTRIL) 40 MG tablet Take 40 mg by mouth daily.     Historical Provider, MD  LORazepam (ATIVAN) 1 MG tablet Take 1 mg by mouth 2 (two) times daily.      Historical Provider, MD  Memantine HCl (NAMENDA PO) Take 1 tablet by mouth at bedtime.     Historical Provider, MD  omeprazole (PRILOSEC) 20 MG capsule Take 20 mg by mouth at bedtime.     Historical Provider, MD  ondansetron (ZOFRAN ODT) 4 MG disintegrating tablet Take 1 tablet (4 mg total) by mouth every 8 (eight) hours as needed for nausea or vomiting. 02/11/14   Kristen N Ward, DO  ondansetron (ZOFRAN) 4 MG tablet Take 1 tablet (4 mg total) by mouth every 8 (eight) hours as needed for  nausea or vomiting. 12/05/13   Geradine Girt, DO  ondansetron (ZOFRAN) 4 MG tablet Take 1 tablet (4 mg total) by mouth every 6 (six) hours. 04/29/14   Hyman Bible, PA-C  Potassium Gluconate 595 MG CAPS Take 595 mg by mouth daily.     Historical Provider, MD  prochlorperazine (COMPAZINE) 25 MG suppository Place 1 suppository (25 mg total) rectally every 12 (twelve) hours as needed for nausea or vomiting. 01/10/14  Gatha Mayer, MD  promethazine (PHENERGAN) 25 MG tablet Take 1 tablet (25 mg total) by mouth every 6 (six) hours as needed for nausea or vomiting. 02/11/14   Delice Bison Ward, DO  tiotropium (SPIRIVA) 18 MCG inhalation capsule Place 1 capsule (18 mcg total) into inhaler and inhale daily as needed (wheezing). 12/05/13   Geradine Girt, DO  traMADol (ULTRAM) 50 MG tablet Take 50 mg by mouth 2 (two) times daily.    Historical Provider, MD   BP 137/71 mmHg  Pulse 81  Temp(Src) 99 F (37.2 C) (Oral)  Resp 18  Ht 5' 9"  (1.753 m)  Wt 278 lb (126.1 kg)  BMI 41.03 kg/m2  SpO2 99% Physical Exam  Constitutional: He is oriented to person, place, and time. He appears well-developed and well-nourished. No distress.  HENT:  Head: Normocephalic and atraumatic.  Neck: Neck supple. No tracheal deviation present.  Cardiovascular: Normal rate.   Pulmonary/Chest: Effort normal. No respiratory distress.  Musculoskeletal: Normal range of motion.  Neurological: He is alert and oriented to person, place, and time.  Skin: Skin is warm and dry.  Psychiatric: He has a normal mood and affect. His behavior is normal.  Nursing note and vitals reviewed. note- lungs- CTAB, no wheeze/rales/rhonchi, CV- RRR no murmur/gallops/rubs, Extremities- no peripheral edema  ED Course  Procedures (including critical care time) DIAGNOSTIC STUDIES: Oxygen Saturation is 99% on RA, normal by my interpretation.    COORDINATION OF CARE: 7:10 PM- Pt advised of plan for treatment which includes medication, radiology, and  labs and pt agrees.  Labs Review Labs Reviewed  COMPREHENSIVE METABOLIC PANEL - Abnormal; Notable for the following:    Glucose, Bld 101 (*)    All other components within normal limits  CBC  LIPASE, BLOOD  BRAIN NATRIURETIC PEPTIDE  TROPONIN I    Imaging Review Dg Chest 2 View  05/16/2014   CLINICAL DATA:  Short of breath, nausea, heart palpitations.  Cough.  EXAM: CHEST  2 VIEW  COMPARISON:  Radiograph 02/11/2014  FINDINGS: Normal mediastinum and cardiac silhouette. Normal pulmonary vasculature. No evidence of effusion, infiltrate, or pneumothorax. No acute bony abnormality. Degenerative osteophytosis of the thoracic spine.  IMPRESSION: No acute cardiopulmonary process.   Electronically Signed   By: Suzy Bouchard M.D.   On: 05/16/2014 19:55     EKG Interpretation   Date/Time:  Friday May 16 2014 19:37:49 EST Ventricular Rate:  70 PR Interval:  122 QRS Duration: 88 QT Interval:  408 QTC Calculation: 440 R Axis:   63 Text Interpretation:  Sinus rhythm with occasional Premature ventricular  complexes Otherwise normal ECG ED PHYSICIAN INTERPRETATION AVAILABLE IN  [facility] Confirmed by TEST, Record ([id number]) on 05/18/2014 9:07:12 AM      Date: 05/16/2014  Rate: 72  Rhythm: normal sinus rhythm  QRS Axis: normal  Intervals: normal  ST/T Wave abnormalities: normal  Conduction Disutrbances: none  Narrative Interpretation: unremarkable     MDM   Final diagnoses:  Shortness of breath  Nausea  Headache  Pt presenting with c/o shortness of breath, nausea and headache, his chronic abdominal pain is stable. Pt has reassuring labs, no sign of fluid overload.  Pt feels improved after zofran and toradol.  Discharged with strict return precautions.  Pt agreeable with plan.   I personally performed the services described in this documentation, which was scribed in my presence. The recorded information has been reviewed and is accurate.     Threasa Beards,  MD 05/18/14 [phone]

## 2014-06-04 DIAGNOSIS — Y92009 Unspecified place in unspecified non-institutional (private) residence as the place of occurrence of the external cause: Secondary | ICD-10-CM

## 2014-06-04 DIAGNOSIS — W19XXXA Unspecified fall, initial encounter: Secondary | ICD-10-CM

## 2014-06-04 HISTORY — DX: Unspecified fall, initial encounter: Y92.009

## 2014-06-04 HISTORY — DX: Unspecified fall, initial encounter: W19.XXXA

## 2014-06-05 ENCOUNTER — Encounter (HOSPITAL_COMMUNITY)
Admission: RE | Admit: 2014-06-05 | Discharge: 2014-06-05 | Disposition: A | Discharge status: Home / Self Care | Payer: Medicaid Other | Source: Ambulatory Visit | Attending: Neurosurgery | Admitting: Neurosurgery

## 2014-06-05 ENCOUNTER — Encounter (HOSPITAL_COMMUNITY): Payer: Self-pay

## 2014-06-05 ENCOUNTER — Other Ambulatory Visit (HOSPITAL_COMMUNITY): Payer: Self-pay | Admitting: Neurosurgery

## 2014-06-05 DIAGNOSIS — M5126 Other intervertebral disc displacement, lumbar region: Secondary | ICD-10-CM | POA: Diagnosis not present

## 2014-06-05 DIAGNOSIS — I252 Old myocardial infarction: Secondary | ICD-10-CM | POA: Diagnosis not present

## 2014-06-05 DIAGNOSIS — J45909 Unspecified asthma, uncomplicated: Secondary | ICD-10-CM | POA: Diagnosis not present

## 2014-06-05 DIAGNOSIS — I10 Essential (primary) hypertension: Secondary | ICD-10-CM | POA: Diagnosis not present

## 2014-06-05 DIAGNOSIS — E119 Type 2 diabetes mellitus without complications: Secondary | ICD-10-CM | POA: Diagnosis not present

## 2014-06-05 DIAGNOSIS — Z87891 Personal history of nicotine dependence: Secondary | ICD-10-CM | POA: Diagnosis not present

## 2014-06-05 DIAGNOSIS — E538 Deficiency of other specified B group vitamins: Secondary | ICD-10-CM | POA: Diagnosis not present

## 2014-06-05 DIAGNOSIS — Z885 Allergy status to narcotic agent status: Secondary | ICD-10-CM | POA: Diagnosis not present

## 2014-06-05 HISTORY — DX: Nausea with vomiting, unspecified: R11.2

## 2014-06-05 HISTORY — DX: Unspecified fall, initial encounter: W19.XXXA

## 2014-06-05 HISTORY — DX: Disorientation, unspecified: R41.0

## 2014-06-05 HISTORY — DX: Depression, unspecified: F32.A

## 2014-06-05 HISTORY — DX: Major depressive disorder, single episode, unspecified: F32.9

## 2014-06-05 HISTORY — DX: Reserved for inherently not codable concepts without codable children: IMO0001

## 2014-06-05 HISTORY — DX: Family history of other specified conditions: Z84.89

## 2014-06-05 HISTORY — DX: Unspecified place in unspecified non-institutional (private) residence as the place of occurrence of the external cause: Y92.009

## 2014-06-05 HISTORY — DX: Frequency of micturition: R35.0

## 2014-06-05 LAB — COMPREHENSIVE METABOLIC PANEL
ALBUMIN: 4 g/dL (ref 3.5–5.2)
ALT: 25 U/L (ref 0–53)
AST: 21 U/L (ref 0–37)
Alkaline Phosphatase: 42 U/L (ref 39–117)
Anion gap: 3 — ABNORMAL LOW (ref 5–15)
BUN: 8 mg/dL (ref 6–23)
CALCIUM: 8.9 mg/dL (ref 8.4–10.5)
CO2: 28 mmol/L (ref 19–32)
CREATININE: 0.62 mg/dL (ref 0.50–1.35)
Chloride: 108 mEq/L (ref 96–112)
GFR calc non Af Amer: >90 mL/min (ref >90)
Glucose, Bld: 173 mg/dL — ABNORMAL HIGH (ref 70–99)
Potassium: 3.7 mmol/L (ref 3.5–5.1)
Sodium: 139 mmol/L (ref 135–145)
TOTAL PROTEIN: 6.3 g/dL (ref 6.0–8.3)
Total Bilirubin: 0.4 mg/dL (ref 0.3–1.2)

## 2014-06-05 LAB — CBC
HCT: 43 % (ref 39.0–52.0)
Hemoglobin: 14.9 g/dL (ref 13.0–17.0)
MCH: 30.1 pg (ref 26.0–34.0)
MCHC: 34.7 g/dL (ref 30.0–36.0)
MCV: 86.9 fL (ref 78.0–100.0)
Platelets: 157 10*3/uL (ref 150–400)
RBC: 4.95 MIL/uL (ref 4.22–5.81)
RDW: 12.9 % (ref 11.5–15.5)
WBC: 4.1 10*3/uL (ref 4.0–10.5)

## 2014-06-05 LAB — SURGICAL PCR SCREEN
MRSA, PCR: NEGATIVE
Staphylococcus aureus: POSITIVE — AB

## 2014-06-05 NOTE — Pre-Procedure Instructions (Signed)
Bryan Becker  06/05/2014   Your procedure is scheduled on:  Wednesday June 18, 2014 at 10:00 AM.  Report to [facility] Admitting at 8:00 AM.  Call this number if you have problems the morning of surgery: [phone]  For any other questions Monday-Friday from 8am-4pm call: [phone]    Remember:   Do not eat food or drink liquids after midnight.   Take these medicines the morning of surgery with A SIP OF WATER: Albuterol inhaler if needed, Citalopram (Celexa), Flonase, Gabapentin (Neurontin), Hydrocodone if needed, Lorazepam (Ativan), nausea medications if needed, Spiriva inhaler, Tramadol (Ultram), Hydrocodone if needed   Please stop taking any vitamins, herbal medications, Advil, Motrin, Alleve, Ibuprofen, etc on 06/11/14  Do NOT take any diabetic medications the morning of your surgery   Please follow Plavix instructions given by your Physician   Please bring BiPAP mask day of surgery   Do not wear jewelry.  Do not wear lotions, powders, or cologne.   Men may shave face and neck.  Do not bring valuables to the hospital.  [facility] is not responsible for any belongings or valuables.               Contacts, dentures or bridgework may not be worn into surgery.  Leave suitcase in the car. After surgery it may be brought to your room.  For patients admitted to the hospital, discharge time is determined by your treatment team.               Patients discharged the day of surgery will not be allowed to drive home.  Name and phone number of your driver:   Special Instructions: Shower using CHG soap the night before and the morning of your surgery   Please read over the following fact sheets that you were given: Pain Booklet, Coughing and Deep Breathing, MRSA Information and Surgical Site Infection Prevention

## 2014-06-05 NOTE — Progress Notes (Signed)
Anesthesia PAT Evaluation:  Patient is a 54 year old male scheduled for left L5-S1 microdiskectomy on 06/18/14 by Dr. Christella Noa.  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 Ivin Booty in [facility]; CT of the liver 10/24/13 was unremarkable by radiologist report), COPD, asthma, anxiety, HLD, fibromyalgia, nephrolithiasis, DM2, OSA with BiPap use, GERD, headaches, arthritis, early dementia, [place] Spotted Fever.   PCP is Dr. York Ram. GI Dr. Silvano Rusk. Neurologist is with [facility] (see Care Everywhere). He saw a cardiologist with [facility] in [facility] (he thinks the [facility] location), but it has been > 5 years.  He saw endocrinologist Dr. Cruzita Lederer in 02/2014, but says he plans to continue with PCP DM management.  Medications listed include albuterol, Celexa, Plavix, Flonase, Lasix, Neurontin, Levemir, Humalog 50/50, lisinopril, Ativan, Namenda, Prilosec, Zofran, potassium gluconate, Spiriva. He also reports Invokana, but I don't see it currently listed on his medication list.   PAT Vitals: BP129/67, RR 18, HR 18. O2 Sat 98%.  05/16/14 EKG: SR with occasional PVC.  04/22/13 Echo: Left ventricle: The cavity size was normal. Wall thickness was increased in a pattern of mild LVH. Systolic function was normal. The estimated ejection fraction was in the range of 60% to 65%. Wall motion was normal; there were no regional wall motion abnormalities. Left ventricular diastolic function parameters were  normal.  10/28/05 cardiac cath: Right dominant. Normal LV systolic function. Normal coronaries. Recommendations: Evaluate for non-cardiac etiology of symptoms.  (Dr. Baxter Hire)  04/21/13 carotid duplex: Right: 1-39% ICA stenosis. Vertebral artery flow is antegrade. ICA/CCA ratio is 0.89. Left: 40-59% ICA stenosis, probably due to mild vessel tortuosity. Vertebral artery flow is retrograde. ICA/CCA ratio is 1.94.  04/21/13 Brain MRI/MRA:  IMPRESSION: 1. No acute intracranial abnormality. Stable and negative non contrast MRI appearance of the brain. 2. Fetal type bilateral PCA origins with otherwise diminutive posterior circulation and evidence of distal vertebral artery atherosclerosis. Distal left vertebral artery stenosis (distal to the PICA origin) may be hemodynamically significant. 3. Otherwise negative intracranial MRA.  10/24/13 CT abd/pelvis: IMPRESSION: Minimal atelectasis versus scarring within the lung bases. Stable 10 mm there is direct lymph node. No further evidence of abdominal or pelvic pathology. No CT evidence accounting for the patient's clinical presentation.  05/16/14 CXR: No acute cardiopulmonary process.  Preoperative labs noted.  Glucose 173.  CBC WNL. A1C 9.1 on 12/05/13 (prior to his DM medication adjustment).   I evaluated patient during his PAT visit.  He was sitting in a hospital wheelchair. Reports he is only able to walk ~ 50 feet at one time. No acute distress. He reports that between Dr. Jimmye Norman and his neurologists his DM and HTN have been much better controlled over the last 6 weeks.  AM glucose ~ 120, PM glucose ~ 160 (previously 300-700).  He denies any new neurologic events.  He remains on Plavix for his history of CVA.  He has chronic LE edema, which he feels is a little better following recent medication adjustments. Chronic DOE, overall stable over the past year, but probably more notable since he became  less active due to back and leg pain > 1 year ago. No  real issues with SOB at rest. No frequent use of albuterol. He denies exertional chest pain, but had intermittent chest pains at rest (pressure lasting up to a couple of minutes) when his BP and glucose were not controlled, last episode 01/2014 with negative cardiac enzymes.  He has not had any issues since his BP and DM has been controlled, and has been seeing Dr. Jimmye Norman ~ once a month for routine follow-up. He gets occasional palpitations, but denies afib history.  He has a strong family history of CAD (Father MI X 2 first at age 55; Mother with angina history-sounds like small vessel disease; Grandparents on both sides with CAD history). On exam, heart RRR, no murmur noted. Lungs clear. No carotid bruit noted. No significant pretibial edema.  Right eye ecchymosis (fell this week).  I reviewed above with anesthesiologist Dr. Marcie Bal.  Patient with normal coronaries in 2007. Unremarkable echo in 04/2013. No exertional chest pain, although pain limits his activity.  His BP is overall controlled today, and he reports his DM control is much improved over the past six weeks following medication adjustment. He denied any new neurologic symptoms. He has had recent neurology evaluation with multiple tests as outlined. If no acute changes, Dr. Marcie Bal felt patient could proceed from an anesthesia standpoint.  I notified Manuela Schwartz at Dr. Hewitt Shorts office to clarify with patient when he should hold Plavix for surgery.  George Hugh [facility] Short Stay Center/Anesthesiology Phone [phone] 06/05/2014 5:06 PM

## 2014-06-05 NOTE — Progress Notes (Signed)
Patient is alert and oriented but informed Nurse that he has dementia, and stated he noticed it before his CVA. PCP is York Ram. 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 Patient informed Nurse that he had a cardiac cath approximately 8 years ago at [facility]. No PCI. Patient also stated that he had a stress test at [facility] approximately 1-2 years ago. Patient has sleep apnea and wears BiPAP nightly.   Patient stated that he has some chest pain, palpitations, and chest pressure whenever his blood pressure or glucose is elevated. Patient stated that his blood sugar has been in the 700s but stated it has been under control lately and fasting glucose in the morning is 120 and in the evening it is 160.  Patient last used Albuterol inhaler yesterday  Nurse asked patient about Plavix and he stated that he "thinks" one of this papers said he needed to stop it a week before his surgery.

## 2014-06-05 NOTE — Progress Notes (Signed)
Patient arrived to PAT with Rodman Key (patients neighbor) via wheelchair. A red contusion and small cut was noted around and under right eye. Nurse questioned patient about bruise and he stated "I fall out of bed all the time  sometimes I will fall off the bed, then I will get back on the bed and a few hours later I will fall off again on the other side. So my wife pushed the bed up against the wall so I wouldn't keep falling off. Well I was trying to move the bed from against the wall and I fell and hit my face on the foot board. It took a long time to get it to stop bleeding, but it finally stopped. I'm ok. I fall all the time....sometimes my left leg just goes out on me."

## 2014-06-17 MED ORDER — CEFAZOLIN SODIUM 10 G IJ SOLR
3.0000 g | INTRAMUSCULAR | Status: AC
  Administered 2014-06-18: 3 g via INTRAVENOUS
  Filled 2014-06-17: qty 3000

## 2014-06-18 ENCOUNTER — Ambulatory Visit (HOSPITAL_COMMUNITY): Payer: Medicaid Other | Admitting: Anesthesiology

## 2014-06-18 ENCOUNTER — Ambulatory Visit (HOSPITAL_COMMUNITY)
Admission: RE | Admit: 2014-06-18 | Discharge: 2014-06-18 | Disposition: A | Discharge status: Home / Self Care | Payer: Medicaid Other | Source: Ambulatory Visit | Attending: Neurosurgery | Admitting: Neurosurgery

## 2014-06-18 ENCOUNTER — Ambulatory Visit (HOSPITAL_COMMUNITY): Payer: Medicaid Other

## 2014-06-18 ENCOUNTER — Encounter (HOSPITAL_COMMUNITY)
Admission: RE | Disposition: A | Discharge status: Home / Self Care | Payer: Self-pay | Source: Ambulatory Visit | Attending: Neurosurgery

## 2014-06-18 ENCOUNTER — Ambulatory Visit (HOSPITAL_COMMUNITY): Payer: Medicaid Other | Admitting: Vascular Surgery

## 2014-06-18 ENCOUNTER — Encounter (HOSPITAL_COMMUNITY): Payer: Self-pay | Admitting: *Deleted

## 2014-06-18 DIAGNOSIS — I252 Old myocardial infarction: Secondary | ICD-10-CM | POA: Insufficient documentation

## 2014-06-18 DIAGNOSIS — M5126 Other intervertebral disc displacement, lumbar region: Secondary | ICD-10-CM | POA: Diagnosis present

## 2014-06-18 DIAGNOSIS — I10 Essential (primary) hypertension: Secondary | ICD-10-CM | POA: Diagnosis not present

## 2014-06-18 DIAGNOSIS — E538 Deficiency of other specified B group vitamins: Secondary | ICD-10-CM | POA: Diagnosis not present

## 2014-06-18 DIAGNOSIS — Z885 Allergy status to narcotic agent status: Secondary | ICD-10-CM | POA: Insufficient documentation

## 2014-06-18 DIAGNOSIS — E119 Type 2 diabetes mellitus without complications: Secondary | ICD-10-CM | POA: Diagnosis not present

## 2014-06-18 DIAGNOSIS — J45909 Unspecified asthma, uncomplicated: Secondary | ICD-10-CM | POA: Insufficient documentation

## 2014-06-18 DIAGNOSIS — Z87891 Personal history of nicotine dependence: Secondary | ICD-10-CM | POA: Insufficient documentation

## 2014-06-18 DIAGNOSIS — Z419 Encounter for procedure for purposes other than remedying health state, unspecified: Secondary | ICD-10-CM

## 2014-06-18 HISTORY — PX: LUMBAR LAMINECTOMY/DECOMPRESSION MICRODISCECTOMY: SHX5026

## 2014-06-18 LAB — GLUCOSE, CAPILLARY
Glucose-Capillary: 286 mg/dL — ABNORMAL HIGH (ref 70–99)
Glucose-Capillary: 225 mg/dL — ABNORMAL HIGH (ref 70–99)

## 2014-06-18 SURGERY — LUMBAR LAMINECTOMY/DECOMPRESSION MICRODISCECTOMY 1 LEVEL
Anesthesia: General | Site: Spine Lumbar | Laterality: Left

## 2014-06-18 MED ORDER — LIDOCAINE HCL (CARDIAC) 20 MG/ML IV SOLN
INTRAVENOUS | Status: DC | PRN
  Administered 2014-06-18: 60 mg via INTRAVENOUS

## 2014-06-18 MED ORDER — HYDROCODONE-ACETAMINOPHEN 5-325 MG PO TABS: 1.0000 | ORAL_TABLET | ORAL | Status: DC | PRN

## 2014-06-18 MED ORDER — ACETAMINOPHEN 10 MG/ML IV SOLN
INTRAVENOUS | Status: AC
  Administered 2014-06-18: 1000 mg via INTRAVENOUS
  Filled 2014-06-18: qty 100

## 2014-06-18 MED ORDER — ONDANSETRON HCL 4 MG/2ML IJ SOLN
INTRAMUSCULAR | Status: DC | PRN
  Administered 2014-06-18: 4 mg via INTRAVENOUS

## 2014-06-18 MED ORDER — 0.9 % SODIUM CHLORIDE (POUR BTL) OPTIME
TOPICAL | Status: DC | PRN
  Administered 2014-06-18: 1000 mL

## 2014-06-18 MED ORDER — FENTANYL CITRATE 0.05 MG/ML IJ SOLN
INTRAMUSCULAR | Status: AC
  Filled 2014-06-18: qty 5

## 2014-06-18 MED ORDER — LIRAGLUTIDE 18 MG/3ML ~~LOC~~ SOPN: 1.8000 mg | PEN_INJECTOR | Freq: Every day | SUBCUTANEOUS | Status: DC

## 2014-06-18 MED ORDER — HYDROMORPHONE HCL 1 MG/ML IJ SOLN: 0.2500 mg | INTRAMUSCULAR | Status: DC | PRN

## 2014-06-18 MED ORDER — ALBUTEROL SULFATE (2.5 MG/3ML) 0.083% IN NEBU: 2.5000 mg | INHALATION_SOLUTION | Freq: Four times a day (QID) | RESPIRATORY_TRACT | Status: DC | PRN

## 2014-06-18 MED ORDER — CYANOCOBALAMIN 1000 MCG/ML IJ SOLN: 1000.0000 ug | INTRAMUSCULAR | Status: DC

## 2014-06-18 MED ORDER — LACTATED RINGERS IV SOLN
INTRAVENOUS | Status: DC | PRN
  Administered 2014-06-18: 10:00:00 via INTRAVENOUS

## 2014-06-18 MED ORDER — DIAZEPAM 5 MG PO TABS: 5.0000 mg | ORAL_TABLET | Freq: Four times a day (QID) | ORAL | Status: DC | PRN

## 2014-06-18 MED ORDER — SENNA 8.6 MG PO TABS: 1.0000 | ORAL_TABLET | Freq: Two times a day (BID) | ORAL | Status: DC

## 2014-06-18 MED ORDER — GEMFIBROZIL 600 MG PO TABS
600.0000 mg | ORAL_TABLET | Freq: Every day | ORAL | Status: DC
  Administered 2014-06-18: 600 mg via ORAL
  Filled 2014-06-18: qty 1

## 2014-06-18 MED ORDER — SODIUM CHLORIDE 0.9 % IJ SOLN: 3.0000 mL | Freq: Two times a day (BID) | INTRAMUSCULAR | Status: DC

## 2014-06-18 MED ORDER — POLYETHYLENE GLYCOL 3350 17 G PO PACK
17.0000 g | PACK | Freq: Every day | ORAL | Status: DC | PRN
  Filled 2014-06-18: qty 1

## 2014-06-18 MED ORDER — GLIMEPIRIDE 4 MG PO TABS
4.0000 mg | ORAL_TABLET | Freq: Every day | ORAL | Status: DC
  Filled 2014-06-18: qty 1

## 2014-06-18 MED ORDER — PROPOFOL 10 MG/ML IV BOLUS
INTRAVENOUS | Status: DC | PRN
  Administered 2014-06-18: 50 mg via INTRAVENOUS
  Administered 2014-06-18: 150 mg via INTRAVENOUS

## 2014-06-18 MED ORDER — SODIUM CHLORIDE 0.9 % IV SOLN: 250.0000 mL | INTRAVENOUS | Status: DC

## 2014-06-18 MED ORDER — TIOTROPIUM BROMIDE MONOHYDRATE 18 MCG IN CAPS
18.0000 ug | ORAL_CAPSULE | Freq: Every day | RESPIRATORY_TRACT | Status: DC | PRN
  Filled 2014-06-18: qty 5

## 2014-06-18 MED ORDER — LORAZEPAM 0.5 MG PO TABS: 1.0000 mg | ORAL_TABLET | Freq: Three times a day (TID) | ORAL | Status: DC | PRN

## 2014-06-18 MED ORDER — PHENOL 1.4 % MT LIQD: 1.0000 | OROMUCOSAL | Status: DC | PRN

## 2014-06-18 MED ORDER — ONDANSETRON HCL 4 MG/2ML IJ SOLN
4.0000 mg | INTRAMUSCULAR | Status: DC | PRN
  Administered 2014-06-18: 4 mg via INTRAVENOUS
  Filled 2014-06-18: qty 2

## 2014-06-18 MED ORDER — ALBUTEROL SULFATE HFA 108 (90 BASE) MCG/ACT IN AERS: 2.0000 | INHALATION_SPRAY | Freq: Four times a day (QID) | RESPIRATORY_TRACT | Status: DC | PRN

## 2014-06-18 MED ORDER — INSULIN ASPART 100 UNIT/ML ~~LOC~~ SOLN: 0.0000 [IU] | Freq: Every day | SUBCUTANEOUS | Status: DC

## 2014-06-18 MED ORDER — ACETAMINOPHEN 325 MG PO TABS: 650.0000 mg | ORAL_TABLET | ORAL | Status: DC | PRN

## 2014-06-18 MED ORDER — BUPIVACAINE HCL (PF) 0.5 % IJ SOLN
INTRAMUSCULAR | Status: DC | PRN
  Administered 2014-06-18: 30 mL

## 2014-06-18 MED ORDER — ONDANSETRON HCL 4 MG/2ML IJ SOLN
INTRAMUSCULAR | Status: AC
  Filled 2014-06-18: qty 2

## 2014-06-18 MED ORDER — ROCURONIUM BROMIDE 50 MG/5ML IV SOLN
INTRAVENOUS | Status: AC
  Filled 2014-06-18: qty 1

## 2014-06-18 MED ORDER — PROCHLORPERAZINE 25 MG RE SUPP
25.0000 mg | Freq: Two times a day (BID) | RECTAL | Status: DC | PRN
  Filled 2014-06-18: qty 1

## 2014-06-18 MED ORDER — LACTATED RINGERS IV SOLN: INTRAVENOUS | Status: DC

## 2014-06-18 MED ORDER — LORATADINE 10 MG PO TABS
10.0000 mg | ORAL_TABLET | Freq: Every day | ORAL | Status: DC
  Administered 2014-06-18: 10 mg via ORAL
  Filled 2014-06-18: qty 1

## 2014-06-18 MED ORDER — MENTHOL 3 MG MT LOZG: 1.0000 | LOZENGE | OROMUCOSAL | Status: DC | PRN

## 2014-06-18 MED ORDER — POTASSIUM CHLORIDE IN NACL 20-0.9 MEQ/L-% IV SOLN
INTRAVENOUS | Status: DC
  Filled 2014-06-18 (×2): qty 1000

## 2014-06-18 MED ORDER — PHENYLEPHRINE 40 MCG/ML (10ML) SYRINGE FOR IV PUSH (FOR BLOOD PRESSURE SUPPORT)
PREFILLED_SYRINGE | INTRAVENOUS | Status: AC
  Filled 2014-06-18: qty 10

## 2014-06-18 MED ORDER — HYDROCODONE-ACETAMINOPHEN 7.5-300 MG PO TABS: 1.0000 | ORAL_TABLET | Freq: Four times a day (QID) | ORAL | Status: DC | PRN

## 2014-06-18 MED ORDER — FENTANYL CITRATE 0.05 MG/ML IJ SOLN
INTRAMUSCULAR | Status: DC | PRN
  Administered 2014-06-18: 50 ug via INTRAVENOUS
  Administered 2014-06-18 (×4): 100 ug via INTRAVENOUS

## 2014-06-18 MED ORDER — FLUTICASONE PROPIONATE 50 MCG/ACT NA SUSP
2.0000 | Freq: Every day | NASAL | Status: DC | PRN
  Filled 2014-06-18: qty 16

## 2014-06-18 MED ORDER — HYDROMORPHONE HCL 1 MG/ML IJ SOLN: 0.5000 mg | INTRAMUSCULAR | Status: DC | PRN

## 2014-06-18 MED ORDER — SUCCINYLCHOLINE CHLORIDE 20 MG/ML IJ SOLN
INTRAMUSCULAR | Status: AC
  Filled 2014-06-18: qty 1

## 2014-06-18 MED ORDER — POTASSIUM GLUCONATE 595 MG PO CAPS
595.0000 mg | ORAL_CAPSULE | Freq: Every day | ORAL | Status: DC
Start: 2014-06-18 — End: 2014-06-18

## 2014-06-18 MED ORDER — PHENYLEPHRINE HCL 10 MG/ML IJ SOLN
INTRAMUSCULAR | Status: DC | PRN
  Administered 2014-06-18: 40 ug via INTRAVENOUS
  Administered 2014-06-18 (×6): 80 ug via INTRAVENOUS

## 2014-06-18 MED ORDER — CITALOPRAM HYDROBROMIDE 40 MG PO TABS: 40.0000 mg | ORAL_TABLET | Freq: Every day | ORAL | Status: DC

## 2014-06-18 MED ORDER — CYCLOBENZAPRINE HCL 10 MG PO TABS: 10.0000 mg | ORAL_TABLET | Freq: Three times a day (TID) | ORAL | Status: DC | PRN

## 2014-06-18 MED ORDER — INSULIN ASPART 100 UNIT/ML ~~LOC~~ SOLN
SUBCUTANEOUS | Status: AC
  Administered 2014-06-18: 8 [IU] via SUBCUTANEOUS
  Filled 2014-06-18: qty 1

## 2014-06-18 MED ORDER — HEMOSTATIC AGENTS (NO CHARGE) OPTIME
TOPICAL | Status: DC | PRN
  Administered 2014-06-18: 1 via TOPICAL

## 2014-06-18 MED ORDER — LIDOCAINE-EPINEPHRINE 0.5 %-1:200000 IJ SOLN
INTRAMUSCULAR | Status: DC | PRN
  Administered 2014-06-18: 10 mL

## 2014-06-18 MED ORDER — LABETALOL HCL 5 MG/ML IV SOLN
INTRAVENOUS | Status: DC | PRN
  Administered 2014-06-18 (×2): 10 mg via INTRAVENOUS

## 2014-06-18 MED ORDER — THROMBIN 5000 UNITS EX SOLR
CUTANEOUS | Status: DC | PRN
  Administered 2014-06-18 (×2): 5000 [IU] via TOPICAL

## 2014-06-18 MED ORDER — CANAGLIFLOZIN 100 MG PO TABS
100.0000 mg | ORAL_TABLET | Freq: Every day | ORAL | Status: DC
  Filled 2014-06-18: qty 1

## 2014-06-18 MED ORDER — SUCCINYLCHOLINE CHLORIDE 20 MG/ML IJ SOLN
INTRAMUSCULAR | Status: DC | PRN
  Administered 2014-06-18: 120 mg via INTRAVENOUS

## 2014-06-18 MED ORDER — INSULIN ASPART 100 UNIT/ML ~~LOC~~ SOLN: 0.0000 [IU] | Freq: Three times a day (TID) | SUBCUTANEOUS | Status: DC

## 2014-06-18 MED ORDER — SODIUM CHLORIDE 0.9 % IJ SOLN
INTRAMUSCULAR | Status: AC
  Filled 2014-06-18: qty 10

## 2014-06-18 MED ORDER — SODIUM CHLORIDE 0.9 % IJ SOLN: 3.0000 mL | INTRAMUSCULAR | Status: DC | PRN

## 2014-06-18 MED ORDER — LISINOPRIL 40 MG PO TABS
40.0000 mg | ORAL_TABLET | Freq: Every day | ORAL | Status: DC
  Administered 2014-06-18: 40 mg via ORAL
  Filled 2014-06-18: qty 1

## 2014-06-18 MED ORDER — PANTOPRAZOLE SODIUM 40 MG PO TBEC: 40.0000 mg | DELAYED_RELEASE_TABLET | Freq: Every day | ORAL | Status: DC

## 2014-06-18 MED ORDER — EPHEDRINE SULFATE 50 MG/ML IJ SOLN
INTRAMUSCULAR | Status: AC
  Filled 2014-06-18: qty 1

## 2014-06-18 MED ORDER — MIDAZOLAM HCL 2 MG/2ML IJ SOLN
INTRAMUSCULAR | Status: AC
  Filled 2014-06-18: qty 2

## 2014-06-18 MED ORDER — L-LYSINE 1000 MG PO TABS: 1000.0000 mg | ORAL_TABLET | Freq: Every day | ORAL | Status: DC | PRN

## 2014-06-18 MED ORDER — ACETAMINOPHEN 650 MG RE SUPP: 650.0000 mg | RECTAL | Status: DC | PRN

## 2014-06-18 MED ORDER — INSULIN DETEMIR 100 UNIT/ML ~~LOC~~ SOLN
60.0000 [IU] | Freq: Every day | SUBCUTANEOUS | Status: DC
  Filled 2014-06-18: qty 0.6

## 2014-06-18 MED ORDER — INSULIN ASPART 100 UNIT/ML ~~LOC~~ SOLN: 4.0000 [IU] | Freq: Three times a day (TID) | SUBCUTANEOUS | Status: DC

## 2014-06-18 MED ORDER — DOCUSATE SODIUM 100 MG PO CAPS
100.0000 mg | ORAL_CAPSULE | Freq: Every day | ORAL | Status: DC | PRN
Start: 2014-06-18 — End: 2014-06-18

## 2014-06-18 MED ORDER — MEMANTINE HCL 5 MG PO TABS
5.0000 mg | ORAL_TABLET | Freq: Every day | ORAL | Status: DC
  Administered 2014-06-18: 5 mg via ORAL
  Filled 2014-06-18: qty 1

## 2014-06-18 MED ORDER — FUROSEMIDE 40 MG PO TABS
40.0000 mg | ORAL_TABLET | Freq: Two times a day (BID) | ORAL | Status: DC
  Filled 2014-06-18 (×2): qty 1

## 2014-06-18 MED ORDER — KETOROLAC TROMETHAMINE 30 MG/ML IJ SOLN: 30.0000 mg | Freq: Four times a day (QID) | INTRAMUSCULAR | Status: DC

## 2014-06-18 MED ORDER — SODIUM CHLORIDE 0.9 % IV SOLN
INTRAVENOUS | Status: DC | PRN
  Administered 2014-06-18 (×2): via INTRAVENOUS

## 2014-06-18 MED ORDER — OXYCODONE-ACETAMINOPHEN 5-325 MG PO TABS
1.0000 | ORAL_TABLET | ORAL | Status: DC | PRN
  Administered 2014-06-18: 2 via ORAL
  Filled 2014-06-18: qty 2

## 2014-06-18 MED ORDER — GABAPENTIN 600 MG PO TABS
1200.0000 mg | ORAL_TABLET | Freq: Three times a day (TID) | ORAL | Status: DC
  Administered 2014-06-18: 1200 mg via ORAL
  Filled 2014-06-18 (×2): qty 2

## 2014-06-18 MED ORDER — LIDOCAINE HCL (CARDIAC) 20 MG/ML IV SOLN
INTRAVENOUS | Status: AC
  Filled 2014-06-18: qty 5

## 2014-06-18 MED ORDER — PROPOFOL 10 MG/ML IV BOLUS
INTRAVENOUS | Status: AC
  Filled 2014-06-18: qty 20

## 2014-06-18 SURGICAL SUPPLY — 53 items
BAG DECANTER FOR FLEXI CONT (MISCELLANEOUS) IMPLANT
BENZOIN TINCTURE PRP APPL 2/3 (GAUZE/BANDAGES/DRESSINGS) IMPLANT
BLADE CLIPPER SURG (BLADE) IMPLANT
BUR MATCHSTICK NEURO 3.0 LAGG (BURR) ×3 IMPLANT
CANISTER SUCT 3000ML (MISCELLANEOUS) ×3 IMPLANT
CLOSURE WOUND 1/2 X4 (GAUZE/BANDAGES/DRESSINGS)
CONT SPEC 4OZ CLIKSEAL STRL BL (MISCELLANEOUS) ×3 IMPLANT
DECANTER SPIKE VIAL GLASS SM (MISCELLANEOUS) ×3 IMPLANT
DRAPE LAPAROTOMY 100X72X124 (DRAPES) ×3 IMPLANT
DRAPE MICROSCOPE LEICA (MISCELLANEOUS) ×3 IMPLANT
DRAPE POUCH INSTRU U-SHP 10X18 (DRAPES) ×3 IMPLANT
DRAPE SURG 17X23 STRL (DRAPES) ×3 IMPLANT
DURAPREP 26ML APPLICATOR (WOUND CARE) ×3 IMPLANT
ELECT REM PT RETURN 9FT ADLT (ELECTROSURGICAL) ×3
ELECTRODE REM PT RTRN 9FT ADLT (ELECTROSURGICAL) ×1 IMPLANT
GAUZE SPONGE 4X4 12PLY STRL (GAUZE/BANDAGES/DRESSINGS) IMPLANT
GAUZE SPONGE 4X4 16PLY XRAY LF (GAUZE/BANDAGES/DRESSINGS) IMPLANT
GLOVE BIO SURGEON STRL SZ8.5 (GLOVE) ×3 IMPLANT
GLOVE BIOGEL PI IND STRL 8 (GLOVE) ×3 IMPLANT
GLOVE BIOGEL PI INDICATOR 8 (GLOVE) ×6
GLOVE ECLIPSE 6.5 STRL STRAW (GLOVE) ×3 IMPLANT
GLOVE ECLIPSE 7.5 STRL STRAW (GLOVE) ×9 IMPLANT
GLOVE EXAM NITRILE LRG STRL (GLOVE) IMPLANT
GLOVE EXAM NITRILE MD LF STRL (GLOVE) IMPLANT
GLOVE EXAM NITRILE XL STR (GLOVE) IMPLANT
GLOVE EXAM NITRILE XS STR PU (GLOVE) IMPLANT
GLOVE SS BIOGEL STRL SZ 8 (GLOVE) ×1 IMPLANT
GLOVE SUPERSENSE BIOGEL SZ 8 (GLOVE) ×2
GOWN STRL REUS W/ TWL LRG LVL3 (GOWN DISPOSABLE) ×1 IMPLANT
GOWN STRL REUS W/ TWL XL LVL3 (GOWN DISPOSABLE) ×1 IMPLANT
GOWN STRL REUS W/TWL 2XL LVL3 (GOWN DISPOSABLE) ×6 IMPLANT
GOWN STRL REUS W/TWL LRG LVL3 (GOWN DISPOSABLE) ×2
GOWN STRL REUS W/TWL XL LVL3 (GOWN DISPOSABLE) ×2
KIT BASIN OR (CUSTOM PROCEDURE TRAY) ×3 IMPLANT
KIT ROOM TURNOVER OR (KITS) ×3 IMPLANT
LIQUID BAND (GAUZE/BANDAGES/DRESSINGS) ×3 IMPLANT
NEEDLE HYPO 25X1 1.5 SAFETY (NEEDLE) ×3 IMPLANT
NEEDLE SPNL 18GX3.5 QUINCKE PK (NEEDLE) ×3 IMPLANT
NS IRRIG 1000ML POUR BTL (IV SOLUTION) ×3 IMPLANT
PACK LAMINECTOMY NEURO (CUSTOM PROCEDURE TRAY) ×3 IMPLANT
PAD ARMBOARD 7.5X6 YLW CONV (MISCELLANEOUS) ×15 IMPLANT
RUBBERBAND STERILE (MISCELLANEOUS) ×6 IMPLANT
SPONGE LAP 4X18 X RAY DECT (DISPOSABLE) IMPLANT
SPONGE SURGIFOAM ABS GEL SZ50 (HEMOSTASIS) ×3 IMPLANT
STRIP CLOSURE SKIN 1/2X4 (GAUZE/BANDAGES/DRESSINGS) IMPLANT
SUT VIC AB 0 CT1 18XCR BRD8 (SUTURE) ×1 IMPLANT
SUT VIC AB 0 CT1 8-18 (SUTURE) ×2
SUT VIC AB 2-0 CT1 18 (SUTURE) ×3 IMPLANT
SUT VIC AB 3-0 SH 8-18 (SUTURE) ×6 IMPLANT
SYR 20ML ECCENTRIC (SYRINGE) ×3 IMPLANT
TOWEL OR 17X24 6PK STRL BLUE (TOWEL DISPOSABLE) ×3 IMPLANT
TOWEL OR 17X26 10 PK STRL BLUE (TOWEL DISPOSABLE) ×3 IMPLANT
WATER STERILE IRR 1000ML POUR (IV SOLUTION) ×3 IMPLANT

## 2014-06-18 NOTE — Anesthesia Postprocedure Evaluation (Signed)
  Anesthesia Post-op Note  Patient: Bryan Becker  Procedure(s) Performed: Procedure(s) with comments: LUMBAR LAMINECTOMY/DECOMPRESSION MICRODISCECTOMY LEFT LUMBAR FIVE-SACRAL ONE (Left) - left  Patient Location: PACU  Anesthesia Type:General  Level of Consciousness: awake and alert   Airway and Oxygen Therapy: Patient Spontanous Breathing  Post-op Pain: none  Post-op Assessment: Post-op Vital signs reviewed, Patient's Cardiovascular Status Stable and Respiratory Function Stable  Post-op Vital Signs: Reviewed  Filed Vitals:   06/18/14 1429  BP:   Pulse: 104  Temp: 36.1 C  Resp: 14    Complications: No apparent anesthesia complications

## 2014-06-18 NOTE — Op Note (Signed)
06/18/2014  1:47 PM  PATIENT:  Bryan Becker  54 y.o. male  PRE-OPERATIVE DIAGNOSIS:  lumbar herniated disc Left L5/S1  POST-OPERATIVE DIAGNOSIS:  lumbar herniated disc left L5/S1  PROCEDURE:  Procedure(s): LUMBAR LAMINECTOMY/DECOMPRESSION MICRODISCECTOMY LEFT LUMBAR FIVE-SACRAL ONE  SURGEON:   Surgeon(s): Ashok Pall, MD Newman Pies, MD  ASSISTANTS:Jenkins, Dellis Filbert  ANESTHESIA:   general  EBL:     BLOOD ADMINISTERED:none  CELL SAVER GIVEN:none  COUNT:per nursing  DRAINS: none   SPECIMEN:  No Specimen  DICTATION: [name] was taken to the operating room, intubated and placed under a general anesthetic without difficulty. He was positioned prone on a Wilson frame with all pressure points padded. His back was prepped and draped in a sterile manner. I opened the skin with a 10 blade and carried the dissection down to the thoracolumbar fascia. I used both sharp dissection and the monopolar cautery to expose the lamina of L4,5, and S1. I confirmed my location with an intraoperative xray.  I used the drill, Kerrison punches, and curettes to perform a semihemilaminectomy of L5. I used the punches to remove the ligamentum flavum to expose the thecal sac. I brought the microscope into the operative field and with Dr.Jenkins' assistance we started our decompression of the spinal canal, thecal sac and L5, and S1 root(s). I cauterized epidural veins overlying the disc space then divided them sharply. I opened the disc space with a 15 blade and proceeded with the discectomy. The disc space was quite narrow, and I had to use an osteotome, and curettes to gain access to the disc space. I also performed a partial facetectomy to decompress the L5 root. I used pituitary rongeurs, curettes, and other instruments to remove disc material. After the discectomy was completed I inspected the L5, and S1 nerve root and felt it was well decompressed. I explored rostrally, laterally, medially, and  caudally and was satisfied with the decompression. I irrigated the wound, then closed in layers. I approximated the thoracolumbar fascia, subcutaneous, and subcuticular planes with vicryl sutures. I used dermabond for a sterile dressing.   PLAN OF CARE: Admit for overnight observation  PATIENT DISPOSITION:  PACU - hemodynamically stable.   Delay start of Pharmacological VTE agent (>24hrs) due to surgical blood loss or risk of bleeding:  yes

## 2014-06-18 NOTE — H&P (Signed)
BP 148/75 mmHg  Pulse 84  Temp(Src) 98.1 F (36.7 C) (Oral)  Resp 20  Wt 126.1 kg (278 lb)  SpO2 97% Bryan Becker presents today for evaluation of pain, which he has in his mid back, hips, posterior thighs.  HOPI: He has had this pain now for a number of years. He had a number of falls and is having to go to the emergency room fairly frequently, every seven to ten days he said, just because things are out of whack. He has had multiple images performed of the cranium because he has had symptoms which I would assume the treating physician at the time felt may have been ischemic. He, however, has not had a cerebral infarct documented. He said, in the past, he was evaluated and told that he had bulging discs but he was not at the point where any surgical intervention was recommended. Bryan Becker also describes pain in his arms and shoulders. He has had an episode this past summer where essentially his left side became fairly useless to him. He did have an evaluation for a possible cerebral infarct; none was found, as I mentioned previously, but when he does have symptoms, they tend to be worse on the left side rather than the right. He was diagnosed with peptic ulcer disease approximately a year ago and is therefore unable to take antiinflammatory medications. He was diagnosed with vitamin B12 deficiency and has been treated with B12 injections now for 20-30 years.  DATA: X-rays of the abdomen were reviewed showing the spine. He has a tiny bit of scoliosis curving towards his right side. There was no significant facet degeneration that I am able to appreciate on these films. Pedicles all look to be in good shape. Head MRI, brain MRIs were reviewed. They appear to be normal. CTs of the brain were normal.  INTERVAL PFSH: Bryan Becker is 54 years of age, currently unemployed secondary to disability. He is right-handed. Mother is 32; father is 42, both in poor health. Heart disease, cerebrovascular disease  is present in the family history, as is cancer and diabetes. He does not smoke. He does not use alcohol. He does not use elicit drugs.  PAST MEDICAL HISTORY: Also includes hypertension, myocardial infarction, cerebrovascular infarct, diabetes, lung disease, a mass. He has undergone a cholecystectomy, tonsillectomy, and has had cysts removed. This was all in the distant past, 20-30 years ago. He does have some episodes of enuresis, but he said that it is not that bad. He does describe some weakness in the legs and arms, left greater than right; pain in his arm, neck, leg, and back.  ALLERGIES: HE HAS AN ALLERGY TO CODEINE. HE SAID IT CAUSES PEELING OF THE SKIN.  REVIEW OF SYSTEMS: Positive for eyeglasses, balance problems, hypertension, hypercholesterolemia, swelling in his extremities, leg pain with walking, leg pain at rest, asthma, shortness of breath, nausea, vomiting, peptic ulcer disease, mast disease, change in bowel habits, urinary incontinence, arm weakness, leg weakness, back pain, joint pain, neck pain, memory problems, disorientation, difficulty concentrating, anxiety, depression, diabetes, excessive urination and thirst, inhalant allergies.  On his pain chart, he lists pain in his left arm and forearm anteriorly and posteriorly, posterior right arm and forearm, anterior/posterior lower extremities bilaterally. He says that pain which he has in his lower extremities has been going on for two years. It is constant, and he does not know why it has occurred.  PHYSICAL EXAMINATION: Vital signs for today's visit: Height of 5 feet  9 inches, weight 283 pounds. Blood pressure is 149/82, pulse 92, respiratory rate 18. Temperature is 99.2 Fahrenheit. Pain is 10/10.  On physical examination, he is alert, oriented x4, in obvious distress. He has a markedly antalgic gait, using a cane. He walks very slowly. He is stable, having a negative Romberg test on examination. He does have proprioception, which was quite  fine this morning in both upper and lower extremities. No clonus. Mild Hoffman's possibly; it was an equivocal response. He does seem to have very brisk reflexes, 2+ biceps, triceps, brachioradialis, knees, and ankles. He has normal strength in the right upper, right lower extremities; some weakness in the hip flexors on the left side at 4/5. Quadriceps, hamstrings are 4+ and 5-/5. Plantar- and dorsiflexion are intact; again, slightly weaker than normal on the left than the normal on the right side. In the upper extremities, 4/5 in the deltoid, biceps, triceps, and grip on the left when compared to the right, which has normal strength. Pupils equal, round, and reactive to light. Full extraocular movements. Full visual fields. Hearing intact to voice. Symmetric facial sensation and movement. Uvula elevates midline. Shoulder shrug is normal. Tongue protrudes in the midline. Bryan Becker returns today. MRI shows a fairly large disk herniation on the left side at L5-S1. I believe this is the reason for his problems. He comes in hobbling using a cane. We discussed frankly all of his options over the 25 minutes in the office. We discussed injections, physical therapy, doing nothing, possible surgery. I, without question believe surgery is his best option since he does have weakness in his left lower extremity, especially dorsiflexion and plantar flexion corresponding to the L5 and S1 roots. He wants to have a little bit more time to think, which I also think is very good. I gave him a detailed sheet about the operation. We discussed the injections. I don't think they will work well but it is a possibility that they may be beneficial. If this is something, mentally, he just has to know he has done everything before he has had surgery, I think it is a reasonable proposition.

## 2014-06-18 NOTE — Anesthesia Preprocedure Evaluation (Addendum)
Anesthesia Evaluation  Patient identified by MRN, date of birth, ID band Patient awake    Reviewed: Allergy & Precautions, H&P , NPO status , Patient's Chart, lab work & pertinent test results  Airway Mallampati: III  TM Distance: >3 FB Neck ROM: Full    Dental no notable dental hx. (+) Teeth Intact, Dental Advisory Given   Pulmonary asthma , sleep apnea and Continuous Positive Airway Pressure Ventilation , COPD COPD inhaler, former smoker,  breath sounds clear to auscultation  Pulmonary exam normal       Cardiovascular hypertension, Pt. on medications + Past MI and +CHF Rhythm:Regular Rate:Normal     Neuro/Psych  Headaches, Anxiety Depression CVA, Residual Symptoms    GI/Hepatic PUD, GERD-  Medicated and Controlled,(+) Cirrhosis -      ,   Endo/Other  diabetes, Type 1, Insulin DependentMorbid obesity  Renal/GU negative Renal ROS  negative genitourinary   Musculoskeletal  (+) Arthritis -, Osteoarthritis,  Fibromyalgia -, narcotic dependent  Abdominal   Peds  Hematology negative hematology ROS (+)   Anesthesia Other Findings   Reproductive/Obstetrics negative OB ROS                            Anesthesia Physical Anesthesia Plan  ASA: III  Anesthesia Plan: General   Post-op Pain Management:    Induction: Intravenous  Airway Management Planned: Oral ETT  Additional Equipment:   Intra-op Plan:   Post-operative Plan: Extubation in OR  Informed Consent: I have reviewed the patients History and Physical, chart, labs and discussed the procedure including the risks, benefits and alternatives for the proposed anesthesia with the patient or authorized representative who has indicated his/her understanding and acceptance.   Dental advisory given  Plan Discussed with: CRNA  Anesthesia Plan Comments:         Anesthesia Quick Evaluation

## 2014-06-18 NOTE — Progress Notes (Signed)
Pt doing well. Pt given D/C instructions with Rx's, verbal understanding was provided. Pt's IV was removed prior to D/C. Pt D/C'd home via walking @ 1815 per MD order. Pt is stable @ D/C and has no other needs at this time. Holli Humbles, RN

## 2014-06-18 NOTE — Transfer of Care (Signed)
Immediate Anesthesia Transfer of Care Note  Patient: Bryan Becker  Procedure(s) Performed: Procedure(s) with comments: LUMBAR LAMINECTOMY/DECOMPRESSION MICRODISCECTOMY LEFT LUMBAR FIVE-SACRAL ONE (Left) - left  Patient Location: PACU  Anesthesia Type:General  Level of Consciousness: sedated, patient cooperative and responds to stimulation  Airway & Oxygen Therapy: Patient Spontanous Breathing and Patient connected to nasal cannula oxygen  Post-op Assessment: Report given to PACU RN, Post -op Vital signs reviewed and stable and Patient moving all extremities  Post vital signs: Reviewed and stable  Complications: No apparent anesthesia complications

## 2014-06-18 NOTE — Discharge Summary (Signed)
Physician Discharge Summary  Patient ID: Bryan Becker MRN: [id number] DOB/AGE: [id number] 55 y.o.  Admit date: 06/18/2014 Discharge date: 06/18/2014  Admission Diagnoses:HNP L5/S1 left  Discharge Diagnoses: HNP L5/S1 left Active Problems:   HNP (herniated nucleus pulposus), lumbar   Discharged Condition: good  Hospital Course: Mr. Barren was admitted and taken to the operating room for an uncomplicated lumbar semihemilaminectomy and discetomy at L5/S1 on the left side. His wound is clean, dry, and without signs of infection. He is voiding, ambulating, and tolerating a regular diet. Pain is improved in the left lower extremity.   Treatments: surgery: as above  Discharge Exam: Blood pressure 122/55, pulse 92, temperature 98.2 F (36.8 C), temperature source Oral, resp. rate 16, weight 126.1 kg (278 lb), SpO2 94 %. General appearance: alert, cooperative, appears stated age and no distress Neurologic: Alert and oriented X 3, normal strength and tone. Normal symmetric reflexes. Normal coordination and gait  Disposition: 01-Home or Self Care lumbar herniated disc    Medication List    STOP taking these medications        HYDROcodone-acetaminophen 10-325 MG per tablet  Commonly known as:  NORCO  Replaced by:  Hydrocodone-Acetaminophen 7.5-300 MG Tabs      TAKE these medications        albuterol 108 (90 BASE) MCG/ACT inhaler  Commonly known as:  PROVENTIL HFA;VENTOLIN HFA  Inhale 2 puffs into the lungs every 6 (six) hours as needed. For shortness of breath     canagliflozin 100 MG Tabs tablet  Commonly known as:  INVOKANA  Take 100 mg by mouth.     citalopram 40 MG tablet  Commonly known as:  CELEXA  Take 40 mg by mouth daily.     clopidogrel 75 MG tablet  Commonly known as:  PLAVIX  Take 75 mg by mouth daily.     cyanocobalamin 1000 MCG/ML injection  Commonly known as:  (VITAMIN B-12)  Inject 1,000 mcg into the muscle once a week.     cyclobenzaprine 10 MG  tablet  Commonly known as:  FLEXERIL  Take 1 tablet (10 mg total) by mouth 3 (three) times daily as needed for muscle spasms.     docusate sodium 100 MG capsule  Commonly known as:  COLACE  Take 100 mg by mouth daily as needed for mild constipation.     fexofenadine 180 MG tablet  Commonly known as:  ALLEGRA  Take 180 mg by mouth daily.     fluticasone 50 MCG/ACT nasal spray  Commonly known as:  FLONASE  Place 2 sprays into both nostrils daily as needed for allergies.     furosemide 40 MG tablet  Commonly known as:  LASIX  Take 1 tablet (40 mg total) by mouth daily.     gabapentin 600 MG tablet  Commonly known as:  NEURONTIN  Take 1,200 mg by mouth 3 (three) times daily.     gabapentin 300 MG capsule  Commonly known as:  NEURONTIN  Take 1,200 mg by mouth 3 (three) times daily.     gemfibrozil 600 MG tablet  Commonly known as:  LOPID  Take 600 mg by mouth daily.     glimepiride 4 MG tablet  Commonly known as:  AMARYL  Take 4 mg by mouth daily with breakfast.     Hydrocodone-Acetaminophen 7.5-300 MG Tabs  Take 1-2 tablets by mouth every 6 (six) hours as needed (pain).     insulin detemir 100 UNIT/ML injection  Commonly known as:  LEVEMIR  Inject 60 Units into the skin at bedtime.     insulin lispro protamine-lispro (50-50) 100 UNIT/ML Susp injection  Commonly known as:  HUMALOG 50/50 MIX  Inject 1-10 Units into the skin 2 (two) times daily before a meal.     L-Lysine 1000 MG Tabs  Take 1,000 mg by mouth daily as needed (for fever blisters).     Liraglutide 18 MG/3ML Sopn  Inject 1.8 mg into the skin daily.     lisinopril 30 MG tablet  Commonly known as:  PRINIVIL,ZESTRIL  Take 30 mg by mouth daily.     lisinopril 40 MG tablet  Commonly known as:  PRINIVIL,ZESTRIL  Take 40 mg by mouth daily.     LORazepam 1 MG tablet  Commonly known as:  ATIVAN  Take 1 mg by mouth every 8 (eight) hours as needed for anxiety.     magnesium (amino acid chelate) 133 MG  tablet  Take 1 tablet by mouth 2 (two) times daily.     NAMENDA PO  Take 1 tablet by mouth at bedtime.     memantine 5 MG tablet  Commonly known as:  NAMENDA  Take 5 mg by mouth daily.     omeprazole 20 MG capsule  Commonly known as:  PRILOSEC  Take 20 mg by mouth at bedtime.     ondansetron 4 MG disintegrating tablet  Commonly known as:  ZOFRAN ODT  Take 1 tablet (4 mg total) by mouth every 8 (eight) hours as needed for nausea or vomiting.     ondansetron 4 MG tablet  Commonly known as:  ZOFRAN  Take 1 tablet (4 mg total) by mouth every 8 (eight) hours as needed for nausea or vomiting.     ondansetron 4 MG tablet  Commonly known as:  ZOFRAN  Take 1 tablet (4 mg total) by mouth every 6 (six) hours.     Potassium Gluconate 595 MG Caps  Take 595 mg by mouth daily.     prochlorperazine 25 MG suppository  Commonly known as:  COMPAZINE  Place 1 suppository (25 mg total) rectally every 12 (twelve) hours as needed for nausea or vomiting.     promethazine 25 MG tablet  Commonly known as:  PHENERGAN  Take 1 tablet (25 mg total) by mouth every 6 (six) hours as needed for nausea or vomiting.     tiotropium 18 MCG inhalation capsule  Commonly known as:  SPIRIVA  Place 1 capsule (18 mcg total) into inhaler and inhale daily as needed (wheezing).     traMADol 50 MG tablet  Commonly known as:  ULTRAM  Take 50 mg by mouth every 8 (eight) hours as needed for moderate pain.           Follow-up Information    Follow up with Jonthan Leite L, MD In 3 weeks.   Specialty:  Neurosurgery   Contact information:   [address] [place] [address] [phone]       Signed: Winfield Cunas 06/18/2014, 5:50 PM

## 2014-06-18 NOTE — Discharge Instructions (Signed)
Lumbar Discectomy °Care After °A discectomy involves removal of discmaterial (the cartilage-like structures located between the bones of the back). It is done to relieve pressure on nerve roots. It can be used as a treatment for a back problem. The time in surgery depends on the findings in surgery and what is necessary to correct the problems. °HOME CARE INSTRUCTIONS  °· Check the cut (incision) made by the surgeon twice a day for signs of infection. Some signs of infection may include:  °· A foul smelling, greenish or yellowish discharge from the wound.  °· Increased pain.  °· Increased redness over the incision (operative) site.  °· The skin edges may separate.  °· Flu-like symptoms (problems).  °· A temperature above 101.5° F (38.6° C).  °· Change your bandages in about 24 to 36 hours following surgery or as directed.  °· You may shower tomrrow.  Avoid bathtubs, swimming pools and hot tubs for three weeks or until your incision has healed completely. °· Follow your doctor's instructions as to safe activities, exercises, and physical therapy.  °· Weight reduction may be beneficial if you are overweight.  °· Daily exercise is helpful to prevent the return of problems. Walking is permitted. You may use a treadmill without an incline. Cut down on activities and exercise if you have discomfort. You may also go up and down stairs as much as you can tolerate.  °· DO NOT lift anything heavier than 10 to 15 lbs. Avoid bending or twisting at the waist. Always bend your knees when lifting.  °· Maintain strength and range of motion as instructed.  °· Do not drive for 10 days, or as directed by your doctors. You may be a passenger . Lying back in the passenger seat may be more comfortable for you. Always wear a seatbelt.  °· Limit your sitting in a regular chair to 20 to 30 minutes at a time. There are no limitations for sitting in a recliner. You should lie down or walk in between sitting periods.  °· Only take  over-the-counter or prescription medicines for pain, discomfort, or fever as directed by your caregiver.  °SEEK MEDICAL CARE IF:  °· There is increased bleeding (more than a small spot) from the wound.  °· You notice redness, swelling, or increasing pain in the wound.  °· Pus is coming from wound.  °· You develop an unexplained oral temperature above 102° F (38.9° C) develops.  °· You notice a foul smell coming from the wound or dressing.  °· You have increasing pain in your wound.  °SEEK IMMEDIATE MEDICAL CARE IF:  °· You develop a rash.  °· You have difficulty breathing.  °· You develop any allergic problems to medicines given.  °Document Released: 04/13/2004 Document Revised: 04/28/2011 Document Reviewed: 08/02/2007 °ExitCare® Patient Information °

## 2014-06-19 ENCOUNTER — Encounter (HOSPITAL_COMMUNITY): Payer: Self-pay | Admitting: Neurosurgery

## 2014-06-19 LAB — HEMOGLOBIN A1C
HEMOGLOBIN A1C: 7.3 % — AB (ref 4.8–5.6)
MEAN PLASMA GLUCOSE: 163 mg/dL

## 2014-06-19 LAB — GLUCOSE, CAPILLARY: Glucose-Capillary: 172 mg/dL — ABNORMAL HIGH (ref 70–99)

## 2014-10-26 ENCOUNTER — Ambulatory Visit (HOSPITAL_BASED_OUTPATIENT_CLINIC_OR_DEPARTMENT_OTHER): Payer: Medicaid Other | Attending: Specialist

## 2014-10-26 VITALS — Ht 69.0 in | Wt 270.0 lb

## 2014-10-26 DIAGNOSIS — R0683 Snoring: Secondary | ICD-10-CM | POA: Insufficient documentation

## 2014-10-26 DIAGNOSIS — G4733 Obstructive sleep apnea (adult) (pediatric): Secondary | ICD-10-CM | POA: Diagnosis not present

## 2014-10-26 DIAGNOSIS — G4710 Hypersomnia, unspecified: Secondary | ICD-10-CM | POA: Diagnosis present

## 2014-11-01 ENCOUNTER — Ambulatory Visit (HOSPITAL_BASED_OUTPATIENT_CLINIC_OR_DEPARTMENT_OTHER): Payer: Medicaid Other | Admitting: Internal Medicine

## 2014-11-01 DIAGNOSIS — G4733 Obstructive sleep apnea (adult) (pediatric): Secondary | ICD-10-CM

## 2014-11-01 NOTE — Sleep Study (Signed)
   NAME: Bryan Becker DATE OF BIRTH:  February 12, 1961 MEDICAL RECORD NUMBER [id number]  LOCATION: [facility]  PHYSICIAN: Grey Schlauch D  DATE OF STUDY: 10/26/2014  SLEEP STUDY TYPE: Nocturnal Polysomnogram               REFERRING PHYSICIAN: Harvie Junior, MD  INDICATION FOR STUDY: Hypersomnia with sleep apnea  EPWORTH SLEEPINESS SCORE:   21/24  HEIGHT: 5' 9"  (175.3 cm)  WEIGHT: 270 lb (122.471 kg)    Body mass index is 39.85 kg/(m^2).  NECK SIZE: 19.5 in.  MEDICATIONS: Charted for review  SLEEP ARCHITECTURE: Split study protocol. During the diagnostic phase, total sleep time 126 minutes with sleep efficiency 82.1%. Stage I was 15.5%, stage II 84.5%, stages 3 and REM were absent. Sleep latency 14 minutes, awake after sleep onset 13.5 minutes, arousal index 9.0, bedtime medication: None  RESPIRATORY DATA: Apnea hypopnea index (AHI) 40.0 per hour. 84 total events scored including 37 obstructive apneas and 47 hypopneas. Most events were while sleeping supine. CPAP was titrated to 14 CWP, AHI 0 per hour. He wore a medium nasal pillow mask.  OXYGEN DATA: Moderately loud snoring before CPAP with oxygen desaturation to a nadir of 79% on room air. With CPAP control, snoring was prevented and mean oxygen saturation was 94.2%.  CARDIAC DATA: Sinus rhythm with PACs and PVCs  MOVEMENT/PARASOMNIA: No movement disturbance, bathroom 1  IMPRESSION/ RECOMMENDATION:   1) Severe obstructive sleep apnea/hypopnea syndrome, AHI 40.0 per hour with most events while sleeping supine. Moderately loud snoring with oxygen desaturation to a nadir of 79% on room air. 2) Successful CPAP titration to 14 CWP, AHI 0.0 per hour. He wore a medium ResMed AirFit nasal pillow mask with chinstrap and heated humidifier. Snoring was prevented and mean oxygen saturation was 94.2%.   Deneise Lever Diplomate, American Board of Sleep Medicine  ELECTRONICALLY SIGNED ON:  11/01/2014, 12:21 PM [place] PH: [phone]   FX: [phone] [place]

## 2015-06-22 ENCOUNTER — Emergency Department (HOSPITAL_BASED_OUTPATIENT_CLINIC_OR_DEPARTMENT_OTHER): Payer: Medicaid Other

## 2015-06-22 ENCOUNTER — Other Ambulatory Visit: Payer: Self-pay

## 2015-06-22 ENCOUNTER — Encounter (HOSPITAL_BASED_OUTPATIENT_CLINIC_OR_DEPARTMENT_OTHER): Payer: Self-pay | Admitting: *Deleted

## 2015-06-22 ENCOUNTER — Emergency Department (HOSPITAL_BASED_OUTPATIENT_CLINIC_OR_DEPARTMENT_OTHER)
Admission: EM | Admit: 2015-06-22 | Discharge: 2015-06-22 | Disposition: A | Discharge status: Home / Self Care | Payer: Medicaid Other | Attending: Emergency Medicine | Admitting: Emergency Medicine

## 2015-06-22 DIAGNOSIS — Z7902 Long term (current) use of antithrombotics/antiplatelets: Secondary | ICD-10-CM | POA: Diagnosis not present

## 2015-06-22 DIAGNOSIS — Z9181 History of falling: Secondary | ICD-10-CM | POA: Diagnosis not present

## 2015-06-22 DIAGNOSIS — G8929 Other chronic pain: Secondary | ICD-10-CM | POA: Insufficient documentation

## 2015-06-22 DIAGNOSIS — M791 Myalgia: Secondary | ICD-10-CM | POA: Insufficient documentation

## 2015-06-22 DIAGNOSIS — Z87891 Personal history of nicotine dependence: Secondary | ICD-10-CM | POA: Diagnosis not present

## 2015-06-22 DIAGNOSIS — I10 Essential (primary) hypertension: Secondary | ICD-10-CM | POA: Diagnosis not present

## 2015-06-22 DIAGNOSIS — K219 Gastro-esophageal reflux disease without esophagitis: Secondary | ICD-10-CM | POA: Diagnosis not present

## 2015-06-22 DIAGNOSIS — F0390 Unspecified dementia without behavioral disturbance: Secondary | ICD-10-CM | POA: Diagnosis not present

## 2015-06-22 DIAGNOSIS — Z8673 Personal history of transient ischemic attack (TIA), and cerebral infarction without residual deficits: Secondary | ICD-10-CM | POA: Insufficient documentation

## 2015-06-22 DIAGNOSIS — M1990 Unspecified osteoarthritis, unspecified site: Secondary | ICD-10-CM | POA: Insufficient documentation

## 2015-06-22 DIAGNOSIS — Z8701 Personal history of pneumonia (recurrent): Secondary | ICD-10-CM | POA: Diagnosis not present

## 2015-06-22 DIAGNOSIS — I252 Old myocardial infarction: Secondary | ICD-10-CM | POA: Insufficient documentation

## 2015-06-22 DIAGNOSIS — Z87442 Personal history of urinary calculi: Secondary | ICD-10-CM | POA: Diagnosis not present

## 2015-06-22 DIAGNOSIS — M79632 Pain in left forearm: Secondary | ICD-10-CM | POA: Insufficient documentation

## 2015-06-22 DIAGNOSIS — Z8619 Personal history of other infectious and parasitic diseases: Secondary | ICD-10-CM | POA: Diagnosis not present

## 2015-06-22 DIAGNOSIS — J449 Chronic obstructive pulmonary disease, unspecified: Secondary | ICD-10-CM | POA: Diagnosis not present

## 2015-06-22 DIAGNOSIS — I509 Heart failure, unspecified: Secondary | ICD-10-CM | POA: Diagnosis not present

## 2015-06-22 DIAGNOSIS — R079 Chest pain, unspecified: Secondary | ICD-10-CM | POA: Diagnosis present

## 2015-06-22 DIAGNOSIS — R0789 Other chest pain: Secondary | ICD-10-CM | POA: Insufficient documentation

## 2015-06-22 DIAGNOSIS — Z79899 Other long term (current) drug therapy: Secondary | ICD-10-CM | POA: Insufficient documentation

## 2015-06-22 DIAGNOSIS — Z794 Long term (current) use of insulin: Secondary | ICD-10-CM | POA: Insufficient documentation

## 2015-06-22 DIAGNOSIS — F419 Anxiety disorder, unspecified: Secondary | ICD-10-CM | POA: Diagnosis not present

## 2015-06-22 LAB — CBC
HCT: 38.3 % — ABNORMAL LOW (ref 39.0–52.0)
Hemoglobin: 12.8 g/dL — ABNORMAL LOW (ref 13.0–17.0)
MCH: 29.2 pg (ref 26.0–34.0)
MCHC: 33.4 g/dL (ref 30.0–36.0)
MCV: 87.2 fL (ref 78.0–100.0)
PLATELETS: 206 10*3/uL (ref 150–400)
RBC: 4.39 MIL/uL (ref 4.22–5.81)
RDW: 13.1 % (ref 11.5–15.5)
WBC: 3.9 10*3/uL — ABNORMAL LOW (ref 4.0–10.5)

## 2015-06-22 LAB — BASIC METABOLIC PANEL
Anion gap: 8 (ref 5–15)
BUN: 16 mg/dL (ref 6–20)
CO2: 29 mmol/L (ref 22–32)
Calcium: 8.9 mg/dL (ref 8.9–10.3)
Chloride: 102 mmol/L (ref 101–111)
Creatinine, Ser: 0.65 mg/dL (ref 0.61–1.24)
GFR calc Af Amer: >60 mL/min (ref >60)
GLUCOSE: 126 mg/dL — AB (ref 65–99)
POTASSIUM: 3.5 mmol/L (ref 3.5–5.1)
Sodium: 139 mmol/L (ref 135–145)

## 2015-06-22 LAB — TROPONIN I: Troponin I: <0.03 ng/mL (ref <0.031)

## 2015-06-22 NOTE — Discharge Instructions (Signed)
It was our pleasure to provide your ER care today - we hope that you feel better.  You may try tylenol/advil as need for pain/symptom relief.  Follow up with your primary care doctor in the coming week if symptoms fail to improve/resolve.  Return to ER if worse, new symptoms, fevers, trouble breathing, persistent/recurrent chest pain, medical emergency, other concern.   Nonspecific Chest Pain  Chest pain can be caused by many different conditions. There is always a chance that your pain could be related to something serious, such as a heart attack or a blood clot in your lungs. Chest pain can also be caused by conditions that are not life-threatening. If you have chest pain, it is very important to follow up with your health care provider. CAUSES  Chest pain can be caused by:  Heartburn.  Pneumonia or bronchitis.  Anxiety or stress.  Inflammation around your heart (pericarditis) or lung (pleuritis or pleurisy).  A blood clot in your lung.  A collapsed lung (pneumothorax). It can develop suddenly on its own (spontaneous pneumothorax) or from trauma to the chest.  Shingles infection (varicella-zoster virus).  Heart attack.  Damage to the bones, muscles, and cartilage that make up your chest wall. This can include:  Bruised bones due to injury.  Strained muscles or cartilage due to frequent or repeated coughing or overwork.  Fracture to one or more ribs.  Sore cartilage due to inflammation (costochondritis). RISK FACTORS  Risk factors for chest pain may include:  Activities that increase your risk for trauma or injury to your chest.  Respiratory infections or conditions that cause frequent coughing.  Medical conditions or overeating that can cause heartburn.  Heart disease or family history of heart disease.  Conditions or health behaviors that increase your risk of developing a blood clot.  Having had chicken pox (varicella zoster). SIGNS AND SYMPTOMS Chest pain  can feel like:  Burning or tingling on the surface of your chest or deep in your chest.  Crushing, pressure, aching, or squeezing pain.  Dull or sharp pain that is worse when you move, cough, or take a deep breath.  Pain that is also felt in your back, neck, shoulder, or arm, or pain that spreads to any of these areas. Your chest pain may come and go, or it may stay constant. DIAGNOSIS Lab tests or other studies may be needed to find the cause of your pain. Your health care provider may have you take a test called an ambulatory ECG (electrocardiogram). An ECG records your heartbeat patterns at the time the test is performed. You may also have other tests, such as:  Transthoracic echocardiogram (TTE). During echocardiography, sound waves are used to create a picture of all of the heart structures and to look at how blood flows through your heart.  Transesophageal echocardiogram (TEE).This is a more advanced imaging test that obtains images from inside your body. It allows your health care provider to see your heart in finer detail.  Cardiac monitoring. This allows your health care provider to monitor your heart rate and rhythm in real time.  Holter monitor. This is a portable device that records your heartbeat and can help to diagnose abnormal heartbeats. It allows your health care provider to track your heart activity for several days, if needed.  Stress tests. These can be done through exercise or by taking medicine that makes your heart beat more quickly.  Blood tests.  Imaging tests. TREATMENT  Your treatment depends on what is causing  your chest pain. Treatment may include:  Medicines. These may include:  Acid blockers for heartburn.  Anti-inflammatory medicine.  Pain medicine for inflammatory conditions.  Antibiotic medicine, if an infection is present.  Medicines to dissolve blood clots.  Medicines to treat coronary artery disease.  Supportive care for conditions  that do not require medicines. This may include:  Resting.  Applying heat or cold packs to injured areas.  Limiting activities until pain decreases. HOME CARE INSTRUCTIONS  If you were prescribed an antibiotic medicine, finish it all even if you start to feel better.  Avoid any activities that bring on chest pain.  Do not use any tobacco products, including cigarettes, chewing tobacco, or electronic cigarettes. If you need help quitting, ask your health care provider.  Do not drink alcohol.  Take medicines only as directed by your health care provider.  Keep all follow-up visits as directed by your health care provider. This is important. This includes any further testing if your chest pain does not go away.  If heartburn is the cause for your chest pain, you may be told to keep your head raised (elevated) while sleeping. This reduces the chance that acid will go from your stomach into your esophagus.  Make lifestyle changes as directed by your health care provider. These may include:  Getting regular exercise. Ask your health care provider to suggest some activities that are safe for you.  Eating a heart-healthy diet. A registered dietitian can help you to learn healthy eating options.  Maintaining a healthy weight.  Managing diabetes, if necessary.  Reducing stress. SEEK MEDICAL CARE IF:  Your chest pain does not go away after treatment.  You have a rash with blisters on your chest.  You have a fever. SEEK IMMEDIATE MEDICAL CARE IF:   Your chest pain is worse.  You have an increasing cough, or you cough up blood.  You have severe abdominal pain.  You have severe weakness.  You faint.  You have chills.  You have sudden, unexplained chest discomfort.  You have sudden, unexplained discomfort in your arms, back, neck, or jaw.  You have shortness of breath at any time.  You suddenly start to sweat, or your skin gets clammy.  You feel nauseous or you  vomit.  You suddenly feel light-headed or dizzy.  Your heart begins to beat quickly, or it feels like it is skipping beats. These symptoms may represent a serious problem that is an emergency. Do not wait to see if the symptoms will go away. Get medical help right away. Call your local emergency services (911 in the [place] Do not drive yourself to the hospital.   This information is not intended to replace advice given to you by your health care provider. Make sure you discuss any questions you have with your health care provider.   Document Released: 02/16/2005 Document Revised: 05/30/2014 Document Reviewed: 12/13/2013 Elsevier Interactive Patient Education 2016 Elsevier Inc.   Chest Wall Pain Chest wall pain is pain in or around the bones and muscles of your chest. Sometimes, an injury causes this pain. Sometimes, the cause may not be known. This pain may take several weeks or longer to get better. HOME CARE INSTRUCTIONS  Pay attention to any changes in your symptoms. Take these actions to help with your pain:   Rest as told by your health care provider.   Avoid activities that cause pain. These include any activities that use your chest muscles or your abdominal and side muscles  to lift heavy items.   If directed, apply ice to the painful area:  Put ice in a plastic bag.  Place a towel between your skin and the bag.  Leave the ice on for 20 minutes, 2-3 times per day.  Take over-the-counter and prescription medicines only as told by your health care provider.  Do not use tobacco products, including cigarettes, chewing tobacco, and e-cigarettes. If you need help quitting, ask your health care provider.  Keep all follow-up visits as told by your health care provider. This is important. SEEK MEDICAL CARE IF:  You have a fever.  Your chest pain becomes worse.  You have new symptoms. SEEK IMMEDIATE MEDICAL CARE IF:  You have nausea or vomiting.  You feel sweaty or  light-headed.  You have a cough with phlegm (sputum) or you cough up blood.  You develop shortness of breath.   This information is not intended to replace advice given to you by your health care provider. Make sure you discuss any questions you have with your health care provider.   Document Released: 05/09/2005 Document Revised: 01/28/2015 Document Reviewed: 08/04/2014 Elsevier Interactive Patient Education [employer].

## 2015-06-22 NOTE — ED Provider Notes (Signed)
CSN: [id number]     Arrival date & time 06/22/15  1253 History   First MD Initiated Contact with Patient 06/22/15 1317     Chief Complaint  Patient presents with  . Chest Pain     (Consider location/radiation/quality/duration/timing/severity/associated sxs/prior Treatment) Patient is a 55 y.o. male presenting with chest pain. The history is provided by the patient.  Chest Pain Associated symptoms: no abdominal pain, no back pain, no cough, no fever, no headache, no shortness of breath and not vomiting   Patient c/o midline lower chest pain onset 1 month ago.  Constant. Dull. Hurts worse when changes position, moves, goes to bend or lift.  Denies specific injury to area. Patient also c/o twisting injury to left forearm last week where he felt a pop in mid forearm. Has had persistent pain to area since. Worse w palpation.  No episodic or exertional chest pain. No associated sob, nv or diaphoresis.  No pleuritic pain. No leg pain or swelling. No recent trauma, travel, surgery, immobility.  No hx dvt or pe. Pt indicates approximately 10 yrs ago had cardiac cath without signif cad.  Pt states use home o2 at night, hx sleep apnea.  Former smoker.  No abrupt change in symptoms today, but persist.         Past Medical History  Diagnosis Date  . Hypertension   . Pancreatitis   . CHF (congestive heart failure) ([place])   . 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 Seasonal allergies   . Asthma   . Anxiety   . COPD (chronic obstructive pulmonary disease) ([place])   . Hyperlipidemia   . Gastric ulcer   . Fibromyalgia   . Kidney stone   . Type II diabetes mellitus ([place])   . Pneumonia     "several times"  . Chronic bronchitis ([place])     "get it q yr"  . GERD (gastroesophageal reflux disease)   . KTGYBWLS(937.3)     "weekly" (12/04/2013)  . Arthritis     "joints"  . Chronic back pain     "mostly lower; have 2 bulging discs in my neck" (12/05/2013)  . Stroke [facility])  03/2013    "still weak on the left side" (12/05/2013)  . Stroke [facility]) 12/04/2013    "came in today w/right sided weakness" (12/04/2013)  . Dementia     onset of early  . [facility] spotted fever) 11/22/2012  . Family history of adverse reaction to anesthesia     Patients mother had a bad reaction to Anesthesia however pt is unaware of reaction  . Shortness of breath dyspnea     with ambulation  . Dementia   . Depression   . Frequent urination   . Confusion   . Nausea & vomiting   . Fall at home 06/04/14    Pt stated he falls off the bed at nightly  . OSA (obstructive sleep apnea) 04/24/2013    "use BiPAP"  . Myocardial infarction [facility])     unsure about MI history, but had normal cath that admission '07 ([facility])   Past Surgical History  Procedure Laterality Date  . Cholecystectomy    . Multiple cysts removal-hip,wrist    . Upper gastrointestinal endoscopy    . Colonoscopy    . Tonsillectomy  ~ 1985  . Cystoscopy w/ stone manipulation    . Cardiac catheterization      10/28/05: right dominant, normal LV systolic function, normal coronaries. (Dr.  Zan Tyson, [facility])  . Lumbar laminectomy/decompression microdiscectomy Left 06/18/2014    Procedure: LUMBAR LAMINECTOMY/DECOMPRESSION MICRODISCECTOMY LEFT LUMBAR FIVE-SACRAL ONE;  Surgeon: Ashok Pall, MD;  Location: [place] NEURO ORS;  Service: Neurosurgery;  Laterality: Left;  left   Family History  Problem Relation Age of Onset  . Colon cancer Neg Hx   . Esophageal cancer Neg Hx   . Rectal cancer Neg Hx   . Skin cancer Father   . Lung cancer Paternal Grandfather   . Stomach cancer Maternal Grandfather   . Bone cancer Paternal Grandmother   . Diabetes Mother   . Diabetes Maternal Grandmother   . Heart disease Maternal Grandmother   . Heart attack Father   . Heart disease Paternal Grandfather   . Heart attack Paternal Grandmother   . Hypertension Mother   . Arthritis Mother   . Other Mother     brain stem vertigo  . Other Mother      disorder of pancreas  . Stomach cancer Father   . Hypertension Father    Social History  Substance Use Topics  . Smoking status: Former Smoker -- 2.00 packs/day for 21 years    Types: Cigarettes    Quit date: 05/23/2002  . Smokeless tobacco: Never Used  . Alcohol Use: No    Review of Systems  Constitutional: Negative for fever and chills.  HENT: Negative for sore throat.   Eyes: Negative for redness.  Respiratory: Negative for cough and shortness of breath.   Cardiovascular: Positive for chest pain. Negative for leg swelling.  Gastrointestinal: Negative for vomiting, abdominal pain and diarrhea.  Genitourinary: Negative for flank pain.  Musculoskeletal: Negative for back pain and neck pain.  Skin: Negative for rash.  Neurological: Negative for headaches.  Hematological: Does not bruise/bleed easily.  Psychiatric/Behavioral: Negative for confusion.      Allergies  Actos; Aricept; and Codeine  Home Medications   Prior to Admission medications   Medication Sig Start Date End Date Taking? Authorizing Provider  albuterol (PROVENTIL HFA;VENTOLIN HFA) 108 (90 BASE) MCG/ACT inhaler Inhale 2 puffs into the lungs every 6 (six) hours as needed. For shortness of breath     Historical Provider, MD  canagliflozin (INVOKANA) 100 MG TABS tablet Take 100 mg by mouth.    Historical Provider, MD  citalopram (CELEXA) 40 MG tablet Take 40 mg by mouth daily.      Historical Provider, MD  clopidogrel (PLAVIX) 75 MG tablet Take 75 mg by mouth daily.    Historical Provider, MD  cyanocobalamin (,VITAMIN B-12,) 1000 MCG/ML injection Inject 1,000 mcg into the muscle once a week.     Historical Provider, MD  cyclobenzaprine (FLEXERIL) 10 MG tablet Take 1 tablet (10 mg total) by mouth 3 (three) times daily as needed for muscle spasms. 06/18/14   Ashok Pall, MD  docusate sodium (COLACE) 100 MG capsule Take 100 mg by mouth daily as needed for mild constipation.    Historical Provider, MD   fexofenadine (ALLEGRA) 180 MG tablet Take 180 mg by mouth daily.    Historical Provider, MD  fluticasone (FLONASE) 50 MCG/ACT nasal spray Place 2 sprays into both nostrils daily as needed for allergies.     Historical Provider, MD  furosemide (LASIX) 40 MG tablet Take 1 tablet (40 mg total) by mouth daily. Patient taking differently: Take 40 mg by mouth 2 (two) times daily.  12/05/13   Geradine Girt, DO  gabapentin (NEURONTIN) 300 MG capsule Take 1,200 mg by mouth 3 (three) times  daily.    Historical Provider, MD  gabapentin (NEURONTIN) 600 MG tablet Take 1,200 mg by mouth 3 (three) times daily.    Historical Provider, MD  gemfibrozil (LOPID) 600 MG tablet Take 600 mg by mouth daily.    Historical Provider, MD  glimepiride (AMARYL) 4 MG tablet Take 4 mg by mouth daily with breakfast.    Historical Provider, MD  Hydrocodone-Acetaminophen 7.5-300 MG TABS Take 1-2 tablets by mouth every 6 (six) hours as needed (pain). 06/18/14   Ashok Pall, MD  insulin detemir (LEVEMIR) 100 UNIT/ML injection Inject 60 Units into the skin at bedtime.     Historical Provider, MD  insulin lispro protamine-lispro (HUMALOG 50/50 MIX) (50-50) 100 UNIT/ML SUSP injection Inject 1-10 Units into the skin 2 (two) times daily before a meal.     Historical Provider, MD  L-Lysine 1000 MG TABS Take 1,000 mg by mouth daily as needed (for fever blisters).    Historical Provider, MD  Liraglutide 18 MG/3ML SOPN Inject 1.8 mg into the skin daily.    Historical Provider, MD  lisinopril (PRINIVIL,ZESTRIL) 30 MG tablet Take 30 mg by mouth daily.    Historical Provider, MD  lisinopril (PRINIVIL,ZESTRIL) 40 MG tablet Take 40 mg by mouth daily.     Historical Provider, MD  LORazepam (ATIVAN) 1 MG tablet Take 1 mg by mouth every 8 (eight) hours as needed for anxiety.     Historical Provider, MD  memantine (NAMENDA) 5 MG tablet Take 5 mg by mouth daily.    Historical Provider, MD  Memantine HCl (NAMENDA PO) Take 1 tablet by mouth at bedtime.      Historical Provider, MD  omeprazole (PRILOSEC) 20 MG capsule Take 20 mg by mouth at bedtime.     Historical Provider, MD  ondansetron (ZOFRAN ODT) 4 MG disintegrating tablet Take 1 tablet (4 mg total) by mouth every 8 (eight) hours as needed for nausea or vomiting. 02/11/14   Kristen N Ward, DO  ondansetron (ZOFRAN) 4 MG tablet Take 1 tablet (4 mg total) by mouth every 8 (eight) hours as needed for nausea or vomiting. 12/05/13   Geradine Girt, DO  ondansetron (ZOFRAN) 4 MG tablet Take 1 tablet (4 mg total) by mouth every 6 (six) hours. 04/29/14   Hyman Bible, PA-C  Potassium Gluconate 595 MG CAPS Take 595 mg by mouth daily.     Historical Provider, MD  prochlorperazine (COMPAZINE) 25 MG suppository Place 1 suppository (25 mg total) rectally every 12 (twelve) hours as needed for nausea or vomiting. 01/10/14   Gatha Mayer, MD  promethazine (PHENERGAN) 25 MG tablet Take 1 tablet (25 mg total) by mouth every 6 (six) hours as needed for nausea or vomiting. 02/11/14   Delice Bison Ward, DO  Specialty Vitamins Products (MAGNESIUM, AMINO ACID CHELATE,) 133 MG tablet Take 1 tablet by mouth 2 (two) times daily.    Historical Provider, MD  tiotropium (SPIRIVA) 18 MCG inhalation capsule Place 1 capsule (18 mcg total) into inhaler and inhale daily as needed (wheezing). 12/05/13   Geradine Girt, DO  traMADol (ULTRAM) 50 MG tablet Take 50 mg by mouth every 8 (eight) hours as needed for moderate pain.     Historical Provider, MD   BP 130/77 mmHg  Pulse 94  Temp(Src) 97.8 F (36.6 C) (Oral)  Resp 20  Ht 5' 9"  (1.753 m)  Wt 117.935 kg  BMI 38.38 kg/m2  SpO2 99% Physical Exam  Constitutional: He is oriented to person, place, and time.  He appears well-developed and well-nourished. No distress.  HENT:  Mouth/Throat: Oropharynx is clear and moist.  Eyes: Conjunctivae are normal. No scleral icterus.  Neck: Neck supple. No tracheal deviation present.  Cardiovascular: Normal rate, regular rhythm, normal heart  sounds and intact distal pulses.  Exam reveals no gallop and no friction rub.   No murmur heard. Pulmonary/Chest: Effort normal. No accessory muscle usage. No respiratory distress. He exhibits tenderness.  Abdominal: Soft. Bowel sounds are normal. He exhibits no distension. There is no tenderness.  Musculoskeletal: Normal range of motion. He exhibits no edema.  Tenderness mid left forearm.   Neurological: He is alert and oriented to person, place, and time.  Skin: Skin is warm and dry. He is not diaphoretic.  Psychiatric: He has a normal mood and affect.  Nursing note and vitals reviewed.   ED Course  Procedures (including critical care time) Labs Review  Results for orders placed or performed during the hospital encounter of 06/22/15  CBC  Result Value Ref Range   WBC 3.9 (L) 4.0 - 10.5 K/uL   RBC 4.39 4.22 - 5.81 MIL/uL   Hemoglobin 12.8 (L) 13.0 - 17.0 g/dL   HCT 38.3 (L) 39.0 - 52.0 %   MCV 87.2 78.0 - 100.0 fL   MCH 29.2 26.0 - 34.0 pg   MCHC 33.4 30.0 - 36.0 g/dL   RDW 13.1 11.5 - 15.5 %   Platelets 206 150 - 400 K/uL  Basic metabolic panel  Result Value Ref Range   Sodium 139 135 - 145 mmol/L   Potassium 3.5 3.5 - 5.1 mmol/L   Chloride 102 101 - 111 mmol/L   CO2 29 22 - 32 mmol/L   Glucose, Bld 126 (H) 65 - 99 mg/dL   BUN 16 6 - 20 mg/dL   Creatinine, Ser 0.65 0.61 - 1.24 mg/dL   Calcium 8.9 8.9 - 10.3 mg/dL   GFR calc non Af Amer >60 >60 mL/min   GFR calc Af Amer >60 >60 mL/min   Anion gap 8 5 - 15  Troponin I  Result Value Ref Range   Troponin I <0.03 <0.031 ng/mL   Dg Chest 2 View  06/22/2015  CLINICAL DATA:  Right-sided chest pain radiating to back for a month. EXAM: CHEST  2 VIEW COMPARISON:  Chest x-ray dated 05/16/2014. FINDINGS: Heart size is upper normal, stable. Overall cardiomediastinal silhouette is stable in size and configuration. Lungs are clear. No pleural effusion. No pneumothorax seen. Mild degenerative change again noted within the thoracic  spine. No acute osseous abnormality. IMPRESSION: Stable chest x-ray. No evidence of acute cardiopulmonary abnormality. Electronically Signed   By: Franki Cabot M.D.   On: 06/22/2015 13:52   Dg Forearm Left  06/22/2015  CLINICAL DATA:  Pain, heard a crack in his left forearm 1 week ago when picking up something EXAM: LEFT FOREARM - 2 VIEW COMPARISON:  None. FINDINGS: There is no evidence of fracture or other focal bone lesions. Soft tissues are unremarkable. IMPRESSION: Negative. Electronically Signed   By: Lahoma Crocker M.D.   On: 06/22/2015 13:54      I have personally reviewed and evaluated these images and lab results as part of my medical decision-making.   EKG Interpretation   Date/Time:  Monday June 22 2015 12:59:11 EST Ventricular Rate:  99 PR Interval:  122 QRS Duration: 88 QT Interval:  346 QTC Calculation: 444 R Axis:   62 Text Interpretation:  Normal sinus rhythm Nonspecific ST abnormality No  significant  change since last tracing Confirmed by [facility]  MD, Lennette Bihari  ([id number]) on 06/22/2015 1:17:57 PM      MDM   Imaging.  Reviewed nursing notes and prior charts for additional history.   Imaging studies neg.  Pt with chest wall tenderness on exam.  No sts or skin changes. No mass. No axillary l/a.   After prolonged symptoms, trop neg, and symptoms felt not c/w acs.  Pt currently appears stable for d/c.  rec close pcp f/u.   Return precautions provided.     Lajean Saver, MD 06/22/15 1452

## 2015-06-22 NOTE — ED Notes (Signed)
MD at bedside. 

## 2015-06-22 NOTE — ED Notes (Signed)
Felt a crack in his left arm when he picked up an object last week. Pain in his right lung for a month. States he feels hoarse.

## 2015-07-03 IMAGING — CR DG CHEST 1V PORT
1 series · 1 of 1 positions shown · non-contrast
Comparison: 04/18/2013 and prior chest radiographs

CLINICAL DATA: 52-year-old male with left-sided weakness - CVA.

EXAM:
PORTABLE CHEST - 1 VIEW

[AP]
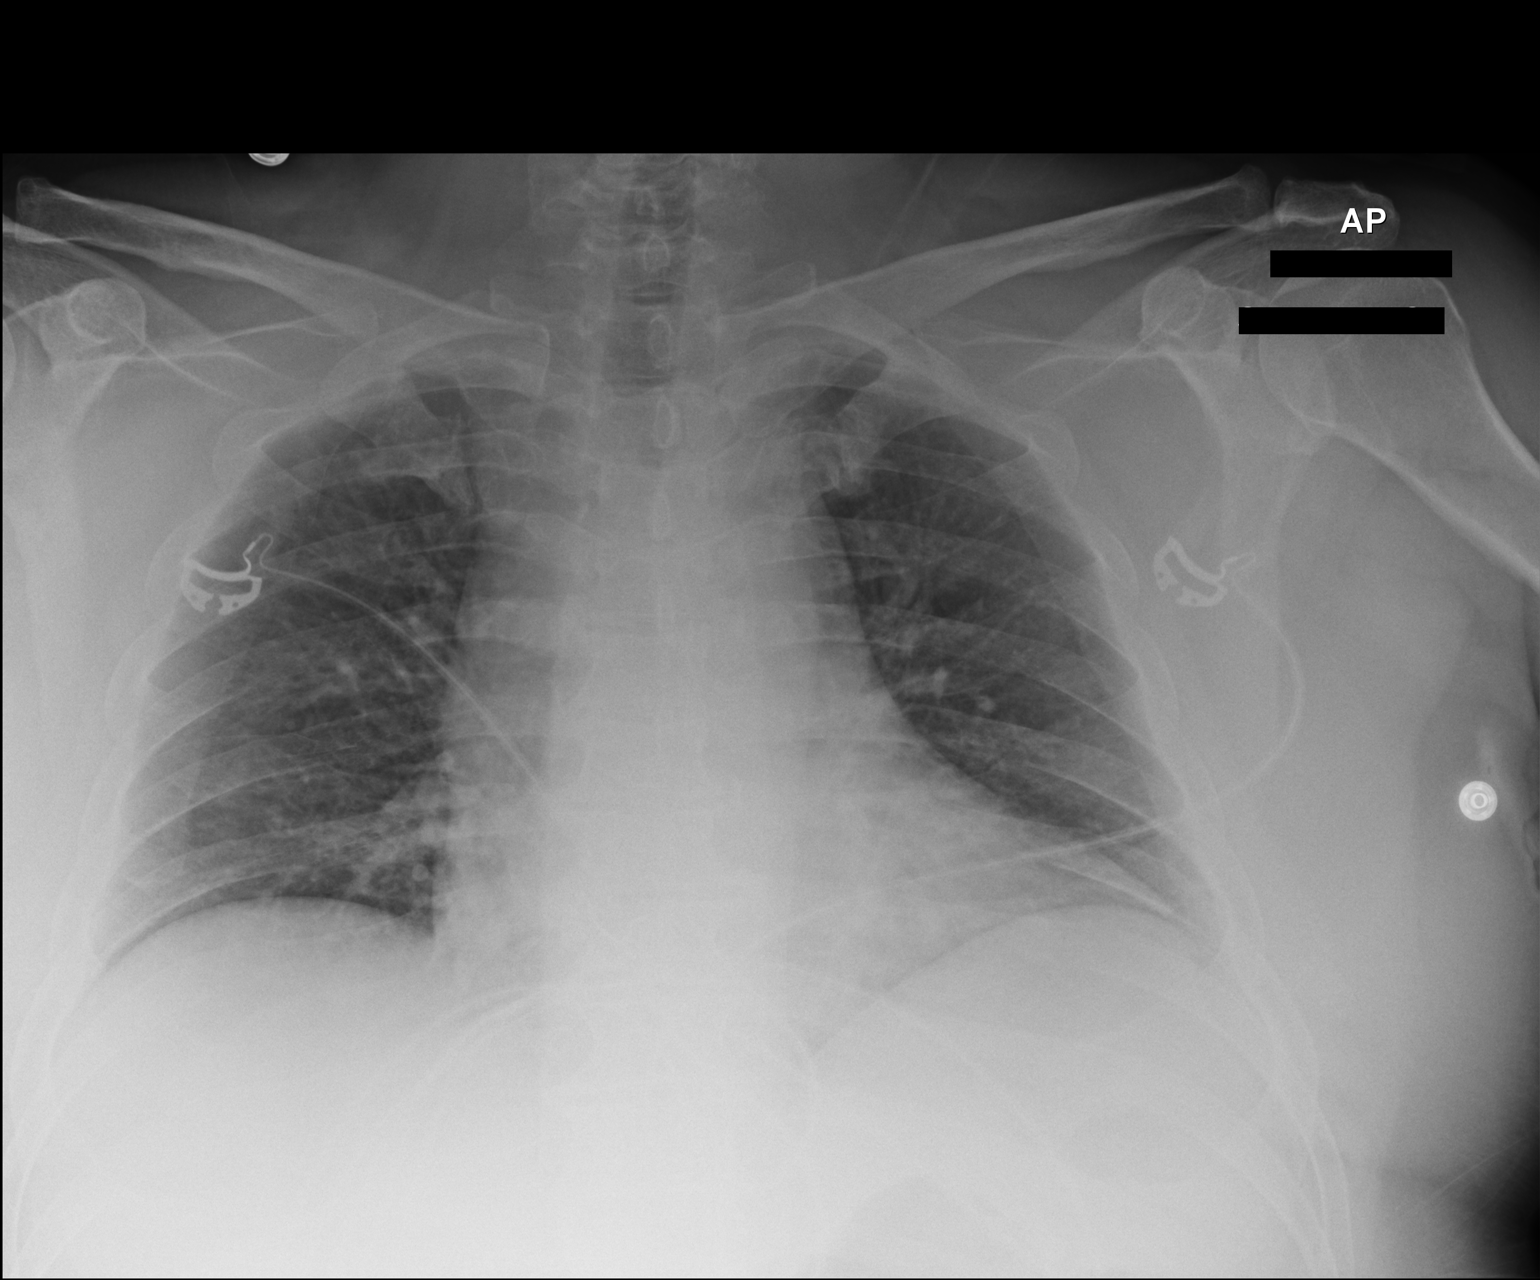

[1 of 1 positions shown; findings below may reference images not displayed]

FINDINGS: Upper limits normal heart size again noted.

Mild peribronchial thickening is stable.

There is no evidence of focal airspace disease, pulmonary edema,
suspicious pulmonary nodule/mass, pleural effusion, or pneumothorax.
No acute bony abnormalities are identified.
IMPRESSION: No evidence of acute cardiopulmonary disease.

## 2015-08-08 ENCOUNTER — Encounter (HOSPITAL_BASED_OUTPATIENT_CLINIC_OR_DEPARTMENT_OTHER): Payer: Self-pay | Admitting: Emergency Medicine

## 2015-08-08 ENCOUNTER — Emergency Department (HOSPITAL_BASED_OUTPATIENT_CLINIC_OR_DEPARTMENT_OTHER)
Admission: EM | Admit: 2015-08-08 | Discharge: 2015-08-09 | Disposition: A | Discharge status: Home / Self Care | Payer: Medicaid Other | Attending: Emergency Medicine | Admitting: Emergency Medicine

## 2015-08-08 DIAGNOSIS — M1990 Unspecified osteoarthritis, unspecified site: Secondary | ICD-10-CM | POA: Diagnosis not present

## 2015-08-08 DIAGNOSIS — R1013 Epigastric pain: Secondary | ICD-10-CM | POA: Insufficient documentation

## 2015-08-08 DIAGNOSIS — G8929 Other chronic pain: Secondary | ICD-10-CM | POA: Insufficient documentation

## 2015-08-08 DIAGNOSIS — E1165 Type 2 diabetes mellitus with hyperglycemia: Secondary | ICD-10-CM | POA: Diagnosis present

## 2015-08-08 DIAGNOSIS — Z8701 Personal history of pneumonia (recurrent): Secondary | ICD-10-CM | POA: Diagnosis not present

## 2015-08-08 DIAGNOSIS — Z9981 Dependence on supplemental oxygen: Secondary | ICD-10-CM | POA: Insufficient documentation

## 2015-08-08 DIAGNOSIS — Z79899 Other long term (current) drug therapy: Secondary | ICD-10-CM | POA: Diagnosis not present

## 2015-08-08 DIAGNOSIS — M797 Fibromyalgia: Secondary | ICD-10-CM | POA: Insufficient documentation

## 2015-08-08 DIAGNOSIS — Z87891 Personal history of nicotine dependence: Secondary | ICD-10-CM | POA: Diagnosis not present

## 2015-08-08 DIAGNOSIS — Z87442 Personal history of urinary calculi: Secondary | ICD-10-CM | POA: Diagnosis not present

## 2015-08-08 DIAGNOSIS — Z7951 Long term (current) use of inhaled steroids: Secondary | ICD-10-CM | POA: Insufficient documentation

## 2015-08-08 DIAGNOSIS — E785 Hyperlipidemia, unspecified: Secondary | ICD-10-CM | POA: Diagnosis not present

## 2015-08-08 DIAGNOSIS — Z8673 Personal history of transient ischemic attack (TIA), and cerebral infarction without residual deficits: Secondary | ICD-10-CM | POA: Insufficient documentation

## 2015-08-08 DIAGNOSIS — I252 Old myocardial infarction: Secondary | ICD-10-CM | POA: Insufficient documentation

## 2015-08-08 DIAGNOSIS — Z7902 Long term (current) use of antithrombotics/antiplatelets: Secondary | ICD-10-CM | POA: Diagnosis not present

## 2015-08-08 DIAGNOSIS — F329 Major depressive disorder, single episode, unspecified: Secondary | ICD-10-CM | POA: Insufficient documentation

## 2015-08-08 DIAGNOSIS — Z7984 Long term (current) use of oral hypoglycemic drugs: Secondary | ICD-10-CM | POA: Insufficient documentation

## 2015-08-08 DIAGNOSIS — J449 Chronic obstructive pulmonary disease, unspecified: Secondary | ICD-10-CM | POA: Insufficient documentation

## 2015-08-08 DIAGNOSIS — K219 Gastro-esophageal reflux disease without esophagitis: Secondary | ICD-10-CM | POA: Diagnosis not present

## 2015-08-08 DIAGNOSIS — Z9049 Acquired absence of other specified parts of digestive tract: Secondary | ICD-10-CM | POA: Insufficient documentation

## 2015-08-08 DIAGNOSIS — Z794 Long term (current) use of insulin: Secondary | ICD-10-CM | POA: Insufficient documentation

## 2015-08-08 DIAGNOSIS — G4733 Obstructive sleep apnea (adult) (pediatric): Secondary | ICD-10-CM | POA: Diagnosis not present

## 2015-08-08 DIAGNOSIS — F0390 Unspecified dementia without behavioral disturbance: Secondary | ICD-10-CM | POA: Diagnosis not present

## 2015-08-08 DIAGNOSIS — Z8619 Personal history of other infectious and parasitic diseases: Secondary | ICD-10-CM | POA: Insufficient documentation

## 2015-08-08 DIAGNOSIS — R739 Hyperglycemia, unspecified: Secondary | ICD-10-CM

## 2015-08-08 DIAGNOSIS — I509 Heart failure, unspecified: Secondary | ICD-10-CM | POA: Diagnosis not present

## 2015-08-08 DIAGNOSIS — I10 Essential (primary) hypertension: Secondary | ICD-10-CM | POA: Diagnosis not present

## 2015-08-08 LAB — URINALYSIS, ROUTINE W REFLEX MICROSCOPIC
Bilirubin Urine: NEGATIVE
Hgb urine dipstick: NEGATIVE
Ketones, ur: NEGATIVE mg/dL
LEUKOCYTES UA: NEGATIVE
Nitrite: NEGATIVE
PH: 5.5 (ref 5.0–8.0)
PROTEIN: NEGATIVE mg/dL
SPECIFIC GRAVITY, URINE: 1.018 (ref 1.005–1.030)

## 2015-08-08 LAB — COMPREHENSIVE METABOLIC PANEL
ALT: 20 U/L (ref 17–63)
AST: 21 U/L (ref 15–41)
Albumin: 4.2 g/dL (ref 3.5–5.0)
Alkaline Phosphatase: 31 U/L — ABNORMAL LOW (ref 38–126)
Anion gap: 10 (ref 5–15)
BUN: 16 mg/dL (ref 6–20)
CHLORIDE: 100 mmol/L — AB (ref 101–111)
CO2: 26 mmol/L (ref 22–32)
Calcium: 8.7 mg/dL — ABNORMAL LOW (ref 8.9–10.3)
Creatinine, Ser: 0.79 mg/dL (ref 0.61–1.24)
Glucose, Bld: 393 mg/dL — ABNORMAL HIGH (ref 65–99)
POTASSIUM: 3.9 mmol/L (ref 3.5–5.1)
SODIUM: 136 mmol/L (ref 135–145)
Total Bilirubin: 0.6 mg/dL (ref 0.3–1.2)
Total Protein: 7 g/dL (ref 6.5–8.1)

## 2015-08-08 LAB — CBC WITH DIFFERENTIAL/PLATELET
Basophils Absolute: 0.1 10*3/uL (ref 0.0–0.1)
Basophils Relative: 2 %
Eosinophils Absolute: 0.1 10*3/uL (ref 0.0–0.7)
Eosinophils Relative: 3 %
HCT: 36.9 % — ABNORMAL LOW (ref 39.0–52.0)
HEMOGLOBIN: 12.6 g/dL — AB (ref 13.0–17.0)
LYMPHS ABS: 1.8 10*3/uL (ref 0.7–4.0)
LYMPHS PCT: 46 %
MCH: 29.9 pg (ref 26.0–34.0)
MCHC: 34.1 g/dL (ref 30.0–36.0)
MCV: 87.6 fL (ref 78.0–100.0)
Monocytes Absolute: 0.4 10*3/uL (ref 0.1–1.0)
Monocytes Relative: 9 %
NEUTROS PCT: 40 %
Neutro Abs: 1.5 10*3/uL — ABNORMAL LOW (ref 1.7–7.7)
Platelets: 204 10*3/uL (ref 150–400)
RBC: 4.21 MIL/uL — AB (ref 4.22–5.81)
RDW: 13.1 % (ref 11.5–15.5)
WBC: 3.8 10*3/uL — AB (ref 4.0–10.5)

## 2015-08-08 LAB — URINE MICROSCOPIC-ADD ON
RBC / HPF: NONE SEEN RBC/hpf (ref 0–5)
WBC, UA: NONE SEEN WBC/hpf (ref 0–5)

## 2015-08-08 LAB — LIPASE, BLOOD: Lipase: 30 U/L (ref 11–51)

## 2015-08-08 LAB — CBG MONITORING, ED
GLUCOSE-CAPILLARY: 348 mg/dL — AB (ref 65–99)
Glucose-Capillary: 283 mg/dL — ABNORMAL HIGH (ref 65–99)

## 2015-08-08 MED ORDER — SODIUM CHLORIDE 0.9 % IV BOLUS (SEPSIS)
1000.0000 mL | Freq: Once | INTRAVENOUS | Status: AC
  Administered 2015-08-08: 1000 mL via INTRAVENOUS

## 2015-08-08 MED ORDER — INSULIN REGULAR HUMAN 100 UNIT/ML IJ SOLN
10.0000 [IU] | Freq: Once | INTRAMUSCULAR | Status: AC
  Administered 2015-08-08: 10 [IU] via SUBCUTANEOUS
  Filled 2015-08-08: qty 1

## 2015-08-08 NOTE — ED Provider Notes (Signed)
CSN: [id number]     Arrival date & time 08/08/15  2018 History  By signing my name below, I, [facility], attest that this documentation has been prepared under the direction and in the presence of Merrily Pew, MD. Electronically Signed: Virgel Bouquet, ED Scribe. 08/08/2015. 12:03 AM.   Chief Complaint  Patient presents with  . Hyperglycemia    The history is provided by the patient. No language interpreter was used.   HPI Comments: Bryan Becker is a 55 y.o. male on home O2 who presents to the Emergency Department complaining of constant, waxing and waning, ongoing hyperglycemia. Brother reports that the patient's blood glucose has been in the 500s. He endorses associated fatigue and abdominal pain. He has been taking a new DM medication but has been unable to obtain more of this medication due to difficulties with his insurance. Denies any other symptoms currently.  Past Medical History  Diagnosis Date  . Hypertension   . Pancreatitis   . CHF (congestive heart failure) ([place])   . 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 Seasonal allergies   . Asthma   . Anxiety   . COPD (chronic obstructive pulmonary disease) ([place])   . Hyperlipidemia   . Gastric ulcer   . Fibromyalgia   . Kidney stone   . Type II diabetes mellitus ([place])   . Pneumonia     "several times"  . Chronic bronchitis ([employer])     "get it q yr"  . GERD (gastroesophageal reflux disease)   . FKCLEXNT(700.1)     "weekly" (12/04/2013)  . Arthritis     "joints"  . Chronic back pain     "mostly lower; have 2 bulging discs in my neck" (12/05/2013)  . Stroke [facility] 03/2013    "still weak on the left side" (12/05/2013)  . Stroke [facility]) 12/04/2013    "came in today w/right sided weakness" (12/04/2013)  . Dementia     onset of early  . [facility] spotted fever) 11/22/2012  . Family history of adverse reaction to anesthesia     Patients mother had a bad reaction to Anesthesia however pt  is unaware of reaction  . Shortness of breath dyspnea     with ambulation  . Dementia   . Depression   . Frequent urination   . Confusion   . Nausea & vomiting   . Fall at home 06/04/14    Pt stated he falls off the bed at nightly  . OSA (obstructive sleep apnea) 04/24/2013    "use BiPAP"  . Myocardial infarction [facility])     unsure about MI history, but had normal cath that admission '07 ([facility])   Past Surgical History  Procedure Laterality Date  . Cholecystectomy    . Multiple cysts removal-hip,wrist    . Upper gastrointestinal endoscopy    . Colonoscopy    . Tonsillectomy  ~ 1985  . Cystoscopy w/ stone manipulation    . Cardiac catheterization      10/28/05: right dominant, normal LV systolic function, normal coronaries. (Dr. Baxter Hire, [facility])  . Lumbar laminectomy/decompression microdiscectomy Left 06/18/2014    Procedure: LUMBAR LAMINECTOMY/DECOMPRESSION MICRODISCECTOMY LEFT LUMBAR FIVE-SACRAL ONE;  Surgeon: Ashok Pall, MD;  Location: [place] [facility];  Service: Neurosurgery;  Laterality: Left;  left   Family History  Problem Relation Age of Onset  . Colon cancer Neg Hx   . Esophageal cancer Neg Hx   . Rectal cancer Neg Hx   .  Skin cancer Father   . Lung cancer Paternal Grandfather   . Stomach cancer Maternal Grandfather   . Bone cancer Paternal Grandmother   . Diabetes Mother   . Diabetes Maternal Grandmother   . Heart disease Maternal Grandmother   . Heart attack Father   . Heart disease Paternal Grandfather   . Heart attack Paternal Grandmother   . Hypertension Mother   . Arthritis Mother   . Other Mother     brain stem vertigo  . Other Mother     disorder of pancreas  . Stomach cancer Father   . Hypertension Father    Social History  Substance Use Topics  . Smoking status: Former Smoker -- 2.00 packs/day for 21 years    Types: Cigarettes    Quit date: 05/23/2002  . Smokeless tobacco: Never Used  . Alcohol Use: No    Review of Systems  Constitutional: Positive  for fatigue. Negative for fever.  Gastrointestinal: Positive for abdominal pain.      Allergies  Actos; Aricept; and Codeine  Home Medications   Prior to Admission medications   Medication Sig Start Date End Date Taking? Authorizing Provider  albuterol (PROVENTIL HFA;VENTOLIN HFA) 108 (90 BASE) MCG/ACT inhaler Inhale 2 puffs into the lungs every 6 (six) hours as needed. For shortness of breath     Historical Provider, MD  canagliflozin (INVOKANA) 100 MG TABS tablet Take 100 mg by mouth.    Historical Provider, MD  citalopram (CELEXA) 40 MG tablet Take 40 mg by mouth daily.      Historical Provider, MD  clopidogrel (PLAVIX) 75 MG tablet Take 75 mg by mouth daily.    Historical Provider, MD  cyanocobalamin (,VITAMIN B-12,) 1000 MCG/ML injection Inject 1,000 mcg into the muscle once a week.     Historical Provider, MD  cyclobenzaprine (FLEXERIL) 10 MG tablet Take 1 tablet (10 mg total) by mouth 3 (three) times daily as needed for muscle spasms. 06/18/14   Ashok Pall, MD  docusate sodium (COLACE) 100 MG capsule Take 100 mg by mouth daily as needed for mild constipation.    Historical Provider, MD  fexofenadine (ALLEGRA) 180 MG tablet Take 180 mg by mouth daily.    Historical Provider, MD  fluticasone (FLONASE) 50 MCG/ACT nasal spray Place 2 sprays into both nostrils daily as needed for allergies.     Historical Provider, MD  furosemide (LASIX) 40 MG tablet Take 1 tablet (40 mg total) by mouth daily. Patient taking differently: Take 40 mg by mouth 2 (two) times daily.  12/05/13   Geradine Girt, DO  gabapentin (NEURONTIN) 300 MG capsule Take 1,200 mg by mouth 3 (three) times daily.    Historical Provider, MD  gabapentin (NEURONTIN) 600 MG tablet Take 1,200 mg by mouth 3 (three) times daily.    Historical Provider, MD  gemfibrozil (LOPID) 600 MG tablet Take 600 mg by mouth daily.    Historical Provider, MD  glimepiride (AMARYL) 4 MG tablet Take 4 mg by mouth daily with breakfast.    Historical  Provider, MD  Hydrocodone-Acetaminophen 7.5-300 MG TABS Take 1-2 tablets by mouth every 6 (six) hours as needed (pain). 06/18/14   Ashok Pall, MD  insulin detemir (LEVEMIR) 100 UNIT/ML injection Inject 60 Units into the skin at bedtime.     Historical Provider, MD  insulin lispro protamine-lispro (HUMALOG 50/50 MIX) (50-50) 100 UNIT/ML SUSP injection Inject 1-10 Units into the skin 2 (two) times daily before a meal.     Historical Provider,  MD  L-Lysine 1000 MG TABS Take 1,000 mg by mouth daily as needed (for fever blisters).    Historical Provider, MD  Liraglutide 18 MG/3ML SOPN Inject 1.8 mg into the skin daily.    Historical Provider, MD  lisinopril (PRINIVIL,ZESTRIL) 30 MG tablet Take 30 mg by mouth daily.    Historical Provider, MD  lisinopril (PRINIVIL,ZESTRIL) 40 MG tablet Take 40 mg by mouth daily.     Historical Provider, MD  LORazepam (ATIVAN) 1 MG tablet Take 1 mg by mouth every 8 (eight) hours as needed for anxiety.     Historical Provider, MD  memantine (NAMENDA) 5 MG tablet Take 5 mg by mouth daily.    Historical Provider, MD  Memantine HCl (NAMENDA PO) Take 1 tablet by mouth at bedtime.     Historical Provider, MD  omeprazole (PRILOSEC) 20 MG capsule Take 20 mg by mouth at bedtime.     Historical Provider, MD  ondansetron (ZOFRAN ODT) 4 MG disintegrating tablet Take 1 tablet (4 mg total) by mouth every 8 (eight) hours as needed for nausea or vomiting. 02/11/14   Kristen N Ward, DO  ondansetron (ZOFRAN) 4 MG tablet Take 1 tablet (4 mg total) by mouth every 8 (eight) hours as needed for nausea or vomiting. 12/05/13   Geradine Girt, DO  ondansetron (ZOFRAN) 4 MG tablet Take 1 tablet (4 mg total) by mouth every 6 (six) hours. 04/29/14   Hyman Bible, PA-C  Potassium Gluconate 595 MG CAPS Take 595 mg by mouth daily.     Historical Provider, MD  prochlorperazine (COMPAZINE) 25 MG suppository Place 1 suppository (25 mg total) rectally every 12 (twelve) hours as needed for nausea or  vomiting. 01/10/14   Gatha Mayer, MD  promethazine (PHENERGAN) 25 MG tablet Take 1 tablet (25 mg total) by mouth every 6 (six) hours as needed for nausea or vomiting. 02/11/14   Delice Bison Ward, DO  Specialty Vitamins Products (MAGNESIUM, AMINO ACID CHELATE,) 133 MG tablet Take 1 tablet by mouth 2 (two) times daily.    Historical Provider, MD  tiotropium (SPIRIVA) 18 MCG inhalation capsule Place 1 capsule (18 mcg total) into inhaler and inhale daily as needed (wheezing). 12/05/13   Geradine Girt, DO  traMADol (ULTRAM) 50 MG tablet Take 50 mg by mouth every 8 (eight) hours as needed for moderate pain.     Historical Provider, MD   BP 146/78 mmHg  Pulse 95  Temp(Src) 98.1 F (36.7 C) (Oral)  Resp 18  Ht 5' 9"  (1.753 m)  Wt 265 lb (120.203 kg)  BMI 39.12 kg/m2  SpO2 98% Physical Exam  Constitutional: He is oriented to person, place, and time. He appears well-developed and well-nourished. No distress.  A little sleepy but woke to commands. Answered questions appropriately.  HENT:  Head: Normocephalic and atraumatic.  Slightly dry mucous membranes.   Eyes: Conjunctivae and EOM are normal.  Neck: Neck supple. No tracheal deviation present.  Cardiovascular: Normal rate, regular rhythm and normal heart sounds.  Exam reveals no gallop and no friction rub.   No murmur heard. Normal heart.  Pulmonary/Chest: Effort normal. No respiratory distress.  Lungs CTA.  Abdominal: He exhibits no distension and no mass. There is tenderness. There is no rebound and no guarding.  Abdomen bengin. No distension, rebound, guarding, or masses. Minimal TTP to epigastric.  Musculoskeletal: Normal range of motion.  Neurological: He is alert and oriented to person, place, and time.  Skin: Skin is warm and dry.  Psychiatric: He has a normal mood and affect. His behavior is normal.  Nursing note and vitals reviewed.   ED Course  Procedures   DIAGNOSTIC STUDIES: Oxygen Saturation is 95% on 2 L/min, adequate by  my interpretation.    COORDINATION OF CARE: 9:56 PM Will order IV insulin, labs, and EKG. Discussed treatment plan with pt at bedside and pt agreed to plan.   Labs Review Labs Reviewed  URINALYSIS, ROUTINE W REFLEX MICROSCOPIC (NOT AT [facility] [address]) - Abnormal; Notable for the following:    Glucose, UA >1000 (*)    All other components within normal limits  URINE MICROSCOPIC-ADD ON - Abnormal; Notable for the following:    Squamous Epithelial / LPF 0-5 (*)    Bacteria, UA RARE (*)    All other components within normal limits  COMPREHENSIVE METABOLIC PANEL - Abnormal; Notable for the following:    Chloride 100 (*)    Glucose, Bld 393 (*)    Calcium 8.7 (*)    Alkaline Phosphatase 31 (*)    All other components within normal limits  CBC WITH DIFFERENTIAL/PLATELET - Abnormal; Notable for the following:    WBC 3.8 (*)    RBC 4.21 (*)    Hemoglobin 12.6 (*)    HCT 36.9 (*)    Neutro Abs 1.5 (*)    All other components within normal limits  CBG MONITORING, ED - Abnormal; Notable for the following:    Glucose-Capillary 348 (*)    All other components within normal limits  CBG MONITORING, ED - Abnormal; Notable for the following:    Glucose-Capillary 283 (*)    All other components within normal limits  LIPASE, BLOOD    Imaging Review No results found. I have personally reviewed and evaluated these images and lab results as part of my medical decision-making.   EKG Interpretation   Date/Time:  Saturday August 08 2015 22:37:23 EDT Ventricular Rate:  94 PR Interval:  130 QRS Duration: 97 QT Interval:  361 QTC Calculation: 451 R Axis:   50 Text Interpretation:  Sinus rhythm ED PHYSICIAN INTERPRETATION AVAILABLE  IN [place] Confirmed by TEST, Record ([id number]) on 08/09/2015 8:36:18  AM      MDM   Final diagnoses:  Hyperglycemia    Hyperglycemia 2/2 medication chagnes. No other complaints. Sleepy on exam, but awakes to voice and follows commands, answers questions  appropriately. Labs without DKA. ecg ok. Blood sugar and mental status improved with fluids and insulin to patient's baseline (per brother/caregiver) Will continue following with PCP to obtain better glucose control.   New Prescriptions: Discharge Medication List as of 08/08/2015 11:22 PM       I have personally and contemperaneously reviewed labs and imaging and used in my decision making as above.   A medical screening exam was performed and I feel the patient has had an appropriate workup for their chief complaint at this time and likelihood of emergent condition existing is low. Their vital signs are stable. They have been counseled on decision, discharge, follow up and which symptoms necessitate immediate return to the emergency department.  They verbally stated understanding and agreement with plan and discharged in stable condition.    I personally performed the services described in this documentation, which was scribed in my presence. The recorded information has been reviewed and is accurate.      Merrily Pew, MD 08/09/15 [phone]

## 2015-08-08 NOTE — ED Notes (Signed)
Pt in c/o hyperglycemia since starting a new medicine for DM type II. Blood sugars have been running in the 500's. Pt is neuro intact and in NAD, on home O2. CBG in triage 348.

## 2015-08-08 NOTE — ED Notes (Signed)
Pt alert/ lethargic, NAD, calm, interactive, sleepy, speech clear, appropriate, following commands, answr questions appropriately, skin W&D, resps e/u, no dyspnea noted.  c/o high BS, states, "lethargic d/t BS", (denies: abd pain, nvd, fever, cough, congestion, cold sx or other sx).

## 2015-11-03 DIAGNOSIS — N289 Disorder of kidney and ureter, unspecified: Secondary | ICD-10-CM | POA: Diagnosis not present

## 2015-11-17 DIAGNOSIS — R351 Nocturia: Secondary | ICD-10-CM | POA: Diagnosis not present

## 2015-11-17 DIAGNOSIS — N3020 Other chronic cystitis without hematuria: Secondary | ICD-10-CM | POA: Diagnosis not present

## 2015-11-17 DIAGNOSIS — N3281 Overactive bladder: Secondary | ICD-10-CM | POA: Diagnosis not present

## 2015-11-17 DIAGNOSIS — N401 Enlarged prostate with lower urinary tract symptoms: Secondary | ICD-10-CM | POA: Diagnosis not present

## 2015-11-17 DIAGNOSIS — N3941 Urge incontinence: Secondary | ICD-10-CM | POA: Diagnosis not present

## 2015-11-17 DIAGNOSIS — N318 Other neuromuscular dysfunction of bladder: Secondary | ICD-10-CM | POA: Diagnosis not present

## 2015-11-21 ENCOUNTER — Emergency Department (HOSPITAL_BASED_OUTPATIENT_CLINIC_OR_DEPARTMENT_OTHER)
Admission: EM | Admit: 2015-11-21 | Discharge: 2015-11-21 | Disposition: A | Discharge status: Home / Self Care | Payer: Medicaid Other | Attending: Emergency Medicine | Admitting: Emergency Medicine

## 2015-11-21 ENCOUNTER — Emergency Department (HOSPITAL_BASED_OUTPATIENT_CLINIC_OR_DEPARTMENT_OTHER): Payer: Medicaid Other

## 2015-11-21 ENCOUNTER — Encounter (HOSPITAL_BASED_OUTPATIENT_CLINIC_OR_DEPARTMENT_OTHER): Payer: Self-pay

## 2015-11-21 DIAGNOSIS — M549 Dorsalgia, unspecified: Secondary | ICD-10-CM | POA: Insufficient documentation

## 2015-11-21 DIAGNOSIS — Z8673 Personal history of transient ischemic attack (TIA), and cerebral infarction without residual deficits: Secondary | ICD-10-CM | POA: Insufficient documentation

## 2015-11-21 DIAGNOSIS — R112 Nausea with vomiting, unspecified: Secondary | ICD-10-CM

## 2015-11-21 DIAGNOSIS — I110 Hypertensive heart disease with heart failure: Secondary | ICD-10-CM | POA: Diagnosis not present

## 2015-11-21 DIAGNOSIS — R300 Dysuria: Secondary | ICD-10-CM | POA: Diagnosis not present

## 2015-11-21 DIAGNOSIS — Z7984 Long term (current) use of oral hypoglycemic drugs: Secondary | ICD-10-CM | POA: Diagnosis not present

## 2015-11-21 DIAGNOSIS — E119 Type 2 diabetes mellitus without complications: Secondary | ICD-10-CM | POA: Insufficient documentation

## 2015-11-21 DIAGNOSIS — F329 Major depressive disorder, single episode, unspecified: Secondary | ICD-10-CM | POA: Diagnosis not present

## 2015-11-21 DIAGNOSIS — R1013 Epigastric pain: Secondary | ICD-10-CM | POA: Diagnosis present

## 2015-11-21 DIAGNOSIS — M1990 Unspecified osteoarthritis, unspecified site: Secondary | ICD-10-CM | POA: Insufficient documentation

## 2015-11-21 DIAGNOSIS — R079 Chest pain, unspecified: Secondary | ICD-10-CM | POA: Insufficient documentation

## 2015-11-21 DIAGNOSIS — J45909 Unspecified asthma, uncomplicated: Secondary | ICD-10-CM | POA: Diagnosis not present

## 2015-11-21 DIAGNOSIS — I509 Heart failure, unspecified: Secondary | ICD-10-CM | POA: Insufficient documentation

## 2015-11-21 DIAGNOSIS — Z87891 Personal history of nicotine dependence: Secondary | ICD-10-CM | POA: Insufficient documentation

## 2015-11-21 DIAGNOSIS — I252 Old myocardial infarction: Secondary | ICD-10-CM | POA: Insufficient documentation

## 2015-11-21 DIAGNOSIS — K2970 Gastritis, unspecified, without bleeding: Secondary | ICD-10-CM | POA: Insufficient documentation

## 2015-11-21 DIAGNOSIS — I10 Essential (primary) hypertension: Secondary | ICD-10-CM | POA: Diagnosis not present

## 2015-11-21 DIAGNOSIS — Z794 Long term (current) use of insulin: Secondary | ICD-10-CM | POA: Insufficient documentation

## 2015-11-21 DIAGNOSIS — J449 Chronic obstructive pulmonary disease, unspecified: Secondary | ICD-10-CM | POA: Insufficient documentation

## 2015-11-21 LAB — COMPREHENSIVE METABOLIC PANEL
ALBUMIN: 4.7 g/dL (ref 3.5–5.0)
ALT: 21 U/L (ref 17–63)
AST: 19 U/L (ref 15–41)
Alkaline Phosphatase: 37 U/L — ABNORMAL LOW (ref 38–126)
Anion gap: 12 (ref 5–15)
BUN: 16 mg/dL (ref 6–20)
CHLORIDE: 97 mmol/L — AB (ref 101–111)
CO2: 26 mmol/L (ref 22–32)
Calcium: 9.1 mg/dL (ref 8.9–10.3)
Creatinine, Ser: 0.76 mg/dL (ref 0.61–1.24)
GFR calc Af Amer: >60 mL/min (ref >60)
Glucose, Bld: 272 mg/dL — ABNORMAL HIGH (ref 65–99)
POTASSIUM: 3.5 mmol/L (ref 3.5–5.1)
SODIUM: 135 mmol/L (ref 135–145)
Total Bilirubin: 0.7 mg/dL (ref 0.3–1.2)
Total Protein: 7.8 g/dL (ref 6.5–8.1)

## 2015-11-21 LAB — URINALYSIS, ROUTINE W REFLEX MICROSCOPIC
Bilirubin Urine: NEGATIVE
Glucose, UA: >1000 mg/dL — AB
Hgb urine dipstick: NEGATIVE
Ketones, ur: 15 mg/dL — AB
LEUKOCYTES UA: NEGATIVE
NITRITE: NEGATIVE
Protein, ur: 30 mg/dL — AB
SPECIFIC GRAVITY, URINE: 1.038 — AB (ref 1.005–1.030)
pH: 7 (ref 5.0–8.0)

## 2015-11-21 LAB — CBC
HCT: 39.9 % (ref 39.0–52.0)
Hemoglobin: 13.9 g/dL (ref 13.0–17.0)
MCH: 29.9 pg (ref 26.0–34.0)
MCHC: 34.8 g/dL (ref 30.0–36.0)
MCV: 85.8 fL (ref 78.0–100.0)
PLATELETS: 216 10*3/uL (ref 150–400)
RBC: 4.65 MIL/uL (ref 4.22–5.81)
RDW: 13.1 % (ref 11.5–15.5)
WBC: 5 10*3/uL (ref 4.0–10.5)

## 2015-11-21 LAB — LIPASE, BLOOD: LIPASE: 25 U/L (ref 11–51)

## 2015-11-21 LAB — URINE MICROSCOPIC-ADD ON: WBC UA: NONE SEEN WBC/hpf (ref 0–5)

## 2015-11-21 LAB — TROPONIN I

## 2015-11-21 MED ORDER — ONDANSETRON HCL 4 MG/2ML IJ SOLN
4.0000 mg | Freq: Once | INTRAMUSCULAR | Status: AC
  Administered 2015-11-21: 4 mg via INTRAVENOUS
  Filled 2015-11-21: qty 2

## 2015-11-21 MED ORDER — SUCRALFATE 1 GM/10ML PO SUSP: 1.0000 g | Freq: Three times a day (TID) | ORAL | Status: DC

## 2015-11-21 MED ORDER — ONDANSETRON 4 MG PO TBDP: ORAL_TABLET | ORAL | Status: DC

## 2015-11-21 MED ORDER — GI COCKTAIL ~~LOC~~
30.0000 mL | Freq: Once | ORAL | Status: AC
  Administered 2015-11-21: 30 mL via ORAL
  Filled 2015-11-21: qty 30

## 2015-11-21 MED ORDER — SODIUM CHLORIDE 0.9 % IV BOLUS (SEPSIS)
1000.0000 mL | Freq: Once | INTRAVENOUS | Status: AC
  Administered 2015-11-21: 1000 mL via INTRAVENOUS

## 2015-11-21 MED ORDER — FENTANYL CITRATE (PF) 100 MCG/2ML IJ SOLN
50.0000 ug | Freq: Once | INTRAMUSCULAR | Status: AC
  Administered 2015-11-21: 50 ug via INTRAVENOUS
  Filled 2015-11-21: qty 2

## 2015-11-21 NOTE — Discharge Instructions (Signed)
Gastritis, Adult Gastritis is soreness and swelling (inflammation) of the lining of the stomach. Gastritis can develop as a sudden onset (acute) or long-term (chronic) condition. If gastritis is not treated, it can lead to stomach bleeding and ulcers. CAUSES  Gastritis occurs when the stomach lining is weak or damaged. Digestive juices from the stomach then inflame the weakened stomach lining. The stomach lining may be weak or damaged due to viral or bacterial infections. One common bacterial infection is the Helicobacter pylori infection. Gastritis can also result from excessive alcohol consumption, taking certain medicines, or having too much acid in the stomach.  SYMPTOMS  In some cases, there are no symptoms. When symptoms are present, they may include:  Pain or a burning sensation in the upper abdomen.  Nausea.  Vomiting.  An uncomfortable feeling of fullness after eating. DIAGNOSIS  Your caregiver may suspect you have gastritis based on your symptoms and a physical exam. To determine the cause of your gastritis, your caregiver may perform the following:  Blood or stool tests to check for the H pylori bacterium.  Gastroscopy. A thin, flexible tube (endoscope) is passed down the esophagus and into the stomach. The endoscope has a light and camera on the end. Your caregiver uses the endoscope to view the inside of the stomach.  Taking a tissue sample (biopsy) from the stomach to examine under a microscope. TREATMENT  Depending on the cause of your gastritis, medicines may be prescribed. If you have a bacterial infection, such as an H pylori infection, antibiotics may be given. If your gastritis is caused by too much acid in the stomach, H2 blockers or antacids may be given. Your caregiver may recommend that you stop taking aspirin, ibuprofen, or other nonsteroidal anti-inflammatory drugs (NSAIDs). HOME CARE INSTRUCTIONS  Only take over-the-counter or prescription medicines as directed by  your caregiver.  If you were given antibiotic medicines, take them as directed. Finish them even if you start to feel better.  Drink enough fluids to keep your urine clear or pale yellow.  Avoid foods and drinks that make your symptoms worse, such as:  Caffeine or alcoholic drinks.  Chocolate.  Peppermint or mint flavorings.  Garlic and onions.  Spicy foods.  Citrus fruits, such as oranges, lemons, or limes.  Tomato-based foods such as sauce, chili, salsa, and pizza.  Fried and fatty foods.  Eat small, frequent meals instead of large meals. SEEK IMMEDIATE MEDICAL CARE IF:   You have black or dark red stools.  You vomit blood or material that looks like coffee grounds.  You are unable to keep fluids down.  Your abdominal pain gets worse.  You have a fever.  You do not feel better after 1 week.  You have any other questions or concerns. MAKE SURE YOU:  Understand these instructions.  Will watch your condition.  Will get help right away if you are not doing well or get worse.   This information is not intended to replace advice given to you by your health care provider. Make sure you discuss any questions you have with your health care provider.   Document Released: 05/03/2001 Document Revised: 11/08/2011 Document Reviewed: 06/22/2011 Elsevier Interactive Patient Education 2016 [employer]  Nausea and Vomiting Nausea is a sick feeling that often comes before throwing up (vomiting). Vomiting is a reflex where stomach contents come out of your mouth. Vomiting can cause severe loss of body fluids (dehydration). Children and elderly adults can become dehydrated quickly, especially if they also have  diarrhea. Nausea and vomiting are symptoms of a condition or disease. It is important to find the cause of your symptoms. CAUSES   Direct irritation of the stomach lining. This irritation can result from increased acid production (gastroesophageal reflux disease),  infection, food poisoning, taking certain medicines (such as nonsteroidal anti-inflammatory drugs), alcohol use, or tobacco use.  Signals from the brain.These signals could be caused by a headache, heat exposure, an inner ear disturbance, increased pressure in the brain from injury, infection, a tumor, or a concussion, pain, emotional stimulus, or metabolic problems.  An obstruction in the gastrointestinal tract (bowel obstruction).  Illnesses such as diabetes, hepatitis, gallbladder problems, appendicitis, kidney problems, cancer, sepsis, atypical symptoms of a heart attack, or eating disorders.  Medical treatments such as chemotherapy and radiation.  Receiving medicine that makes you sleep (general anesthetic) during surgery. DIAGNOSIS Your caregiver may ask for tests to be done if the problems do not improve after a few days. Tests may also be done if symptoms are severe or if the reason for the nausea and vomiting is not clear. Tests may include:  Urine tests.  Blood tests.  Stool tests.  Cultures (to look for evidence of infection).  X-rays or other imaging studies. Test results can help your caregiver make decisions about treatment or the need for additional tests. TREATMENT You need to stay well hydrated. Drink frequently but in small amounts.You may wish to drink water, sports drinks, clear broth, or eat frozen ice pops or gelatin dessert to help stay hydrated.When you eat, eating slowly may help prevent nausea.There are also some antinausea medicines that may help prevent nausea. HOME CARE INSTRUCTIONS   Take all medicine as directed by your caregiver.  If you do not have an appetite, do not force yourself to eat. However, you must continue to drink fluids.  If you have an appetite, eat a normal diet unless your caregiver tells you differently.  Eat a variety of complex carbohydrates (rice, wheat, potatoes, bread), lean meats, yogurt, fruits, and vegetables.  Avoid  high-fat foods because they are more difficult to digest.  Drink enough water and fluids to keep your urine clear or pale yellow.  If you are dehydrated, ask your caregiver for specific rehydration instructions. Signs of dehydration may include:  Severe thirst.  Dry lips and mouth.  Dizziness.  Dark urine.  Decreasing urine frequency and amount.  Confusion.  Rapid breathing or pulse. SEEK IMMEDIATE MEDICAL CARE IF:   You have blood or brown flecks (like coffee grounds) in your vomit.  You have black or bloody stools.  You have a severe headache or stiff neck.  You are confused.  You have severe abdominal pain.  You have chest pain or trouble breathing.  You do not urinate at least once every 8 hours.  You develop cold or clammy skin.  You continue to vomit for longer than 24 to 48 hours.  You have a fever. MAKE SURE YOU:   Understand these instructions.  Will watch your condition.  Will get help right away if you are not doing well or get worse.   This information is not intended to replace advice given to you by your health care provider. Make sure you discuss any questions you have with your health care provider.   Document Released: 05/09/2005 Document Revised: 08/01/2011 Document Reviewed: 10/06/2010 Elsevier Interactive Patient Education [employer].

## 2015-11-21 NOTE — ED Provider Notes (Signed)
CSN: [id number]     Arrival date & time 11/21/15  2016 History  By signing my name below, I, Bryan Becker, attest that this documentation has been prepared under the direction and in the presence of [name], MD . Electronically Signed: Evelene Becker, Scribe. 11/21/2015. 9:17 PM.    Chief Complaint  Patient presents with  . Abdominal Pain  . Emesis    The history is provided by the patient. No language interpreter was used.    HPI Comments:  Bryan Becker is a 55 y.o. male who presents to the Emergency Department complaining of nausea and multiple episodes of vomiting x 3 days. Pt reports associated abdominal pain. Pt states his pain starts around his left flank and radiates around to the left upper abdomen and umbilical region. Pt notes his pain comes in waves and when it returns he becomes nauseous and diaphoretic. Pt denies fever and diarrhea. His last normal BM was yesterday. Pt has a h/o kidney stones but notes his pain today is dissimilar. No alleviating factors noted. Pt also notes episode of CP 3 days ago prior current symptom onset that has since resolved.   Past Medical History  Diagnosis Date  . Hypertension   . Pancreatitis   . CHF (congestive heart failure) ([place])   . 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 Seasonal allergies   . Asthma   . Anxiety   . COPD (chronic obstructive pulmonary disease) ([place])   . Hyperlipidemia   . Gastric ulcer   . Fibromyalgia   . Kidney stone   . Type II diabetes mellitus ([place])   . Pneumonia     "several times"  . Chronic bronchitis ([place])     "get it q yr"  . GERD (gastroesophageal reflux disease)   . XYIAXKPV(374.8)     "weekly" (12/04/2013)  . Arthritis     "joints"  . Chronic back pain     "mostly lower; have 2 bulging discs in my neck" (12/05/2013)  . Stroke [facility]) 03/2013    "still weak on the left side" (12/05/2013)  . Stroke [facility]) 12/04/2013    "came in today w/right sided weakness" (12/04/2013)   . Dementia     onset of early  . RMSF [facility] spotted fever) 11/22/2012  . Family history of adverse reaction to anesthesia     Patients mother had a bad reaction to Anesthesia however pt is unaware of reaction  . Shortness of breath dyspnea     with ambulation  . Dementia   . Depression   . Frequent urination   . Confusion   . Nausea & vomiting   . Fall at home 06/04/14    Pt stated he falls off the bed at nightly  . OSA (obstructive sleep apnea) 04/24/2013    "use BiPAP"  . Myocardial infarction [facility])     unsure about MI history, but had normal cath that admission '07 ([facility])   Past Surgical History  Procedure Laterality Date  . Cholecystectomy    . Multiple cysts removal-hip,wrist    . Upper gastrointestinal endoscopy    . Colonoscopy    . Tonsillectomy  ~ 1985  . Cystoscopy w/ stone manipulation    . Cardiac catheterization      10/28/05: right dominant, normal LV systolic function, normal coronaries. (Dr. Baxter Hire, [facility])  . Lumbar laminectomy/decompression microdiscectomy Left 06/18/2014    Procedure: LUMBAR LAMINECTOMY/DECOMPRESSION MICRODISCECTOMY LEFT LUMBAR FIVE-SACRAL ONE;  Surgeon: Ashok Pall,  MD;  Location: [facility];  Service: Neurosurgery;  Laterality: Left;  left   Family History  Problem Relation Age of Onset  . Colon cancer Neg Hx   . Esophageal cancer Neg Hx   . Rectal cancer Neg Hx   . Skin cancer Father   . Lung cancer Paternal Grandfather   . Stomach cancer Maternal Grandfather   . Bone cancer Paternal Grandmother   . Diabetes Mother   . Diabetes Maternal Grandmother   . Heart disease Maternal Grandmother   . Heart attack Father   . Heart disease Paternal Grandfather   . Heart attack Paternal Grandmother   . Hypertension Mother   . Arthritis Mother   . Other Mother     brain stem vertigo  . Other Mother     disorder of pancreas  . Stomach cancer Father   . Hypertension Father    Social History  Substance Use Topics  . Smoking  status: Former Smoker -- 2.00 packs/day for 21 years    Types: Cigarettes    Quit date: 05/23/2002  . Smokeless tobacco: Never Used  . Alcohol Use: No    Review of Systems  Constitutional: Positive for diaphoresis. Negative for fever and chills.  Respiratory: Negative for cough and shortness of breath.   Cardiovascular: Positive for chest pain (resolved). Negative for palpitations and leg swelling.  Gastrointestinal: Positive for nausea, vomiting and abdominal pain. Negative for diarrhea, constipation and blood in stool.  Genitourinary: Positive for dysuria, flank pain and difficulty urinating. Negative for hematuria.  Musculoskeletal: Positive for myalgias and back pain. Negative for neck pain and neck stiffness.  Skin: Negative for rash and wound.  Neurological: Negative for dizziness, weakness, light-headedness, numbness and headaches.  All other systems reviewed and are negative.   Allergies  Actos; Aricept; and Codeine  Home Medications   Prior to Admission medications   Medication Sig Start Date End Date Taking? Authorizing Provider  albuterol (PROVENTIL HFA;VENTOLIN HFA) 108 (90 BASE) MCG/ACT inhaler Inhale 2 puffs into the lungs every 6 (six) hours as needed. For shortness of breath     Historical Provider, MD  canagliflozin (INVOKANA) 100 MG TABS tablet Take 100 mg by mouth.    Historical Provider, MD  citalopram (CELEXA) 40 MG tablet Take 40 mg by mouth daily.      Historical Provider, MD  clopidogrel (PLAVIX) 75 MG tablet Take 75 mg by mouth daily.    Historical Provider, MD  cyanocobalamin (,VITAMIN B-12,) 1000 MCG/ML injection Inject 1,000 mcg into the muscle once a week.     Historical Provider, MD  cyclobenzaprine (FLEXERIL) 10 MG tablet Take 1 tablet (10 mg total) by mouth 3 (three) times daily as needed for muscle spasms. 06/18/14   Ashok Pall, MD  docusate sodium (COLACE) 100 MG capsule Take 100 mg by mouth daily as needed for mild constipation.    Historical  Provider, MD  fexofenadine (ALLEGRA) 180 MG tablet Take 180 mg by mouth daily.    Historical Provider, MD  fluticasone (FLONASE) 50 MCG/ACT nasal spray Place 2 sprays into both nostrils daily as needed for allergies.     Historical Provider, MD  furosemide (LASIX) 40 MG tablet Take 1 tablet (40 mg total) by mouth daily. Patient taking differently: Take 40 mg by mouth 2 (two) times daily.  12/05/13   Geradine Girt, DO  gabapentin (NEURONTIN) 300 MG capsule Take 1,200 mg by mouth 3 (three) times daily.    Historical Provider, MD  gabapentin (  NEURONTIN) 600 MG tablet Take 1,200 mg by mouth 3 (three) times daily.    Historical Provider, MD  gemfibrozil (LOPID) 600 MG tablet Take 600 mg by mouth daily.    Historical Provider, MD  glimepiride (AMARYL) 4 MG tablet Take 4 mg by mouth daily with breakfast.    Historical Provider, MD  Hydrocodone-Acetaminophen 7.5-300 MG TABS Take 1-2 tablets by mouth every 6 (six) hours as needed (pain). 06/18/14   Ashok Pall, MD  insulin detemir (LEVEMIR) 100 UNIT/ML injection Inject 60 Units into the skin at bedtime.     Historical Provider, MD  insulin lispro protamine-lispro (HUMALOG 50/50 MIX) (50-50) 100 UNIT/ML SUSP injection Inject 1-10 Units into the skin 2 (two) times daily before a meal.     Historical Provider, MD  L-Lysine 1000 MG TABS Take 1,000 mg by mouth daily as needed (for fever blisters).    Historical Provider, MD  Liraglutide 18 MG/3ML SOPN Inject 1.8 mg into the skin daily.    Historical Provider, MD  lisinopril (PRINIVIL,ZESTRIL) 30 MG tablet Take 30 mg by mouth daily.    Historical Provider, MD  lisinopril (PRINIVIL,ZESTRIL) 40 MG tablet Take 40 mg by mouth daily.     Historical Provider, MD  LORazepam (ATIVAN) 1 MG tablet Take 1 mg by mouth every 8 (eight) hours as needed for anxiety.     Historical Provider, MD  memantine (NAMENDA) 5 MG tablet Take 5 mg by mouth daily.    Historical Provider, MD  Memantine HCl (NAMENDA PO) Take 1 tablet by mouth  at bedtime.     Historical Provider, MD  omeprazole (PRILOSEC) 20 MG capsule Take 20 mg by mouth at bedtime.     Historical Provider, MD  ondansetron (ZOFRAN ODT) 4 MG disintegrating tablet 34m ODT q4 hours prn nausea/vomit 11/21/15   DJulianne Rice MD  ondansetron (ZOFRAN) 4 MG tablet Take 1 tablet (4 mg total) by mouth every 8 (eight) hours as needed for nausea or vomiting. 12/05/13   JGeradine Girt DO  ondansetron (ZOFRAN) 4 MG tablet Take 1 tablet (4 mg total) by mouth every 6 (six) hours. 04/29/14   HHyman Bible PA-C  Potassium Gluconate 595 MG CAPS Take 595 mg by mouth daily.     Historical Provider, MD  prochlorperazine (COMPAZINE) 25 MG suppository Place 1 suppository (25 mg total) rectally every 12 (twelve) hours as needed for nausea or vomiting. 01/10/14   CGatha Mayer MD  promethazine (PHENERGAN) 25 MG tablet Take 1 tablet (25 mg total) by mouth every 6 (six) hours as needed for nausea or vomiting. 02/11/14   KDelice BisonWard, DO  Specialty Vitamins Products (MAGNESIUM, AMINO ACID CHELATE,) 133 MG tablet Take 1 tablet by mouth 2 (two) times daily.    Historical Provider, MD  sucralfate (CARAFATE) 1 GM/10ML suspension Take 10 mLs (1 g total) by mouth 4 (four) times daily -  with meals and at bedtime. 11/21/15   DJulianne Rice MD  tiotropium (SPIRIVA) 18 MCG inhalation capsule Place 1 capsule (18 mcg total) into inhaler and inhale daily as needed (wheezing). 12/05/13   JGeradine Girt DO  traMADol (ULTRAM) 50 MG tablet Take 50 mg by mouth every 8 (eight) hours as needed for moderate pain.     Historical Provider, MD   BP 122/64 mmHg  Pulse 93  Temp(Src) 98.6 F (37 C) (Oral)  Resp 18  SpO2 96% Physical Exam  Constitutional: He is oriented to person, place, and time. He appears well-developed and well-nourished.  No distress.  HENT:  Head: Normocephalic and atraumatic.  Mouth/Throat: Oropharynx is clear and moist. No oropharyngeal exudate.  Eyes: EOM are normal. Pupils are equal,  round, and reactive to light.  Neck: Normal range of motion. Neck supple.  Cardiovascular: Normal rate and regular rhythm.   Pulmonary/Chest: Effort normal and breath sounds normal. No respiratory distress. He has no wheezes. He has no rales. He exhibits no tenderness.  Abdominal: Soft. Bowel sounds are normal. He exhibits no distension and no mass. There is tenderness (epigastric and left upper quadrant tenderness with palpation.). There is no rebound and no guarding.  Musculoskeletal: Normal range of motion. He exhibits tenderness. He exhibits no edema.  Left flank tenderness with percussion. Distal pulses intact. No midline thoracic or lumbar tenderness. Negative straight leg raise bilaterally.  Neurological: He is alert and oriented to person, place, and time.  4/5 motor in left upper and lower extremities. 5/5 motor in right upper and right lower extremities. Sensation grossly intact.  Skin: Skin is warm and dry. No rash noted. No erythema.  Psychiatric: He has a normal mood and affect. His behavior is normal.  Nursing note and vitals reviewed.   ED Course  Procedures   DIAGNOSTIC STUDIES:  Oxygen Saturation is 97% on RA, normal by my interpretation.    COORDINATION OF CARE:  8:59 PM Discussed treatment plan with pt at bedside and pt agreed to plan.  Labs Review Labs Reviewed  COMPREHENSIVE METABOLIC PANEL - Abnormal; Notable for the following:    Chloride 97 (*)    Glucose, Bld 272 (*)    Alkaline Phosphatase 37 (*)    All other components within normal limits  URINALYSIS, ROUTINE W REFLEX MICROSCOPIC (NOT AT [facility]) - Abnormal; Notable for the following:    Specific Gravity, Urine 1.038 (*)    Glucose, UA >1000 (*)    Ketones, ur 15 (*)    Protein, ur 30 (*)    All other components within normal limits  URINE MICROSCOPIC-ADD ON - Abnormal; Notable for the following:    Squamous Epithelial / LPF 0-5 (*)    Bacteria, UA RARE (*)    All other components within normal limits   TROPONIN I  CBC  LIPASE, BLOOD    Imaging Review Ct Renal Stone Study  11/21/2015  CLINICAL DATA:  55 year old male with left flank pain EXAM: CT ABDOMEN AND PELVIS WITHOUT CONTRAST TECHNIQUE: Multidetector CT imaging of the abdomen and pelvis was performed following the standard protocol without IV contrast. COMPARISON:  Renal ultrasound dated 11/03/2015 FINDINGS: Evaluation of this exam is limited in the absence of intravenous contrast. The visualized lung bases are clear. No intra-abdominal free air or free fluid. Diffuse fatty infiltration of the liver. There is mild irregularity of the hepatic contour. Correlation with clinical exam is recommended to evaluate for possible cirrhosis. Cholecystectomy. The pancreas, spleen, adrenal glands appear unremarkable. The kidneys, visualized ureters, and urinary bladder appear unremarkable. There is no hydronephrosis or nephrolithiasis on either side. The prostate and seminal vesicles are grossly unremarkable. There is no evidence of bowel obstruction or active inflammation. Moderate stool noted throughout the colon. Normal appendix. Minimal aortoiliac atherosclerotic disease. The abdominal aorta and IVC are grossly unremarkable on this noncontrast study. No portal venous gas identified. There is no adenopathy. There is a small fat containing umbilical hernia. The abdominal wall soft tissues appear unremarkable. There is degenerative changes of the spine. No acute fracture. IMPRESSION: No acute intra-abdominal pelvic pathology. Specifically there is no hydronephrosis  or nephrolithiasis. Electronically Signed   By: Anner Crete M.D.   On: 11/21/2015 21:57   I have personally reviewed and evaluated these images and lab results as part of my medical decision-making.   EKG Interpretation   Date/Time:  Saturday November 21 2015 21:07:29 EDT Ventricular Rate:  87 PR Interval:    QRS Duration: 98 QT Interval:  375 QTC Calculation: 452 R Axis:   59 Text  Interpretation:  Sinus rhythm Baseline wander in lead(s) V5 Confirmed  by Chiquitta Matty  MD, Sarena Jezek ([id number]) on 11/21/2015 10:10:34 PM      MDM   Final diagnoses:  Gastritis  Non-intractable vomiting with nausea, vomiting of unspecified type    I personally performed the services described in this documentation, which was scribed in my presence. The recorded information has been reviewed and is accurate.   CT without acute abnormality. Suspect this is the patient's gastritis which has been exacerbated. She currently is on a PPI and Carafate and Zofran. She is advised to follow-up with a gastroenterologist. Return precautions given.   [name], MD 11/22/15 [phone]

## 2015-11-21 NOTE — ED Notes (Addendum)
Patient complains of upper abdominal pain with vomiting x 3 days, denies diarrhea. Pain does not change with intake. More pain LUQ. Reports that his blood sugar was greater than 400 today and took extra insulin

## 2015-12-15 DIAGNOSIS — R5383 Other fatigue: Secondary | ICD-10-CM | POA: Diagnosis not present

## 2015-12-18 ENCOUNTER — Institutional Professional Consult (permissible substitution): Payer: Medicaid Other | Admitting: Internal Medicine

## 2016-01-04 ENCOUNTER — Encounter (HOSPITAL_BASED_OUTPATIENT_CLINIC_OR_DEPARTMENT_OTHER): Payer: Self-pay

## 2016-01-04 ENCOUNTER — Encounter: Payer: Self-pay | Admitting: Internal Medicine

## 2016-01-04 ENCOUNTER — Ambulatory Visit (HOSPITAL_BASED_OUTPATIENT_CLINIC_OR_DEPARTMENT_OTHER): Payer: Medicare Other | Attending: Specialist | Admitting: Internal Medicine

## 2016-01-04 ENCOUNTER — Ambulatory Visit (INDEPENDENT_AMBULATORY_CARE_PROVIDER_SITE_OTHER): Payer: Medicare Other | Admitting: Internal Medicine

## 2016-01-04 VITALS — BP 150/88 | HR 81 | Ht 68.0 in | Wt 284.8 lb

## 2016-01-04 VITALS — Ht 68.0 in | Wt 284.0 lb

## 2016-01-04 DIAGNOSIS — R5383 Other fatigue: Secondary | ICD-10-CM | POA: Diagnosis not present

## 2016-01-04 DIAGNOSIS — J45909 Unspecified asthma, uncomplicated: Secondary | ICD-10-CM

## 2016-01-04 DIAGNOSIS — J449 Chronic obstructive pulmonary disease, unspecified: Secondary | ICD-10-CM

## 2016-01-04 DIAGNOSIS — G4733 Obstructive sleep apnea (adult) (pediatric): Secondary | ICD-10-CM | POA: Diagnosis not present

## 2016-01-04 DIAGNOSIS — I10 Essential (primary) hypertension: Secondary | ICD-10-CM

## 2016-01-04 DIAGNOSIS — R0683 Snoring: Secondary | ICD-10-CM | POA: Insufficient documentation

## 2016-01-04 MED ORDER — IRBESARTAN 300 MG PO TABS: 300.0000 mg | ORAL_TABLET | Freq: Every day | ORAL | 5 refills | Status: DC

## 2016-01-04 NOTE — Assessment & Plan Note (Signed)
Trial off acei  01/04/2016 >>> due to dry daytime cough   In the best review of chronic cough to date ( NEJM 2016 375 0301-4996) ,  ACEi are now felt to cause cough in up to  20% of pts which is a 4 fold increase from previous reports and does not include the variety of non-specific complaints we see in pulmonary clinic in pts on ACEi but previously attributed to another dx like  Copd/asthma and  include PNDS, throat and chest congestion, "bronchitis", unexplained dyspnea and noct "strangling" sensations, and hoarseness, but also  atypical /refractory GERD symptoms like dysphagia and "bad heartburn"   The only way I know  to prove this is not an "ACEi Case" is a trial off ACEi x a minimum of 6 weeks then regroup.   Try avapro 300 mg daily as his bp has historically been difficult to control

## 2016-01-04 NOTE — Assessment & Plan Note (Signed)
Complicated by osa/ djd   Body mass index is 43.3 kg/m.  No results found for: TSH   Contributing to gerd tendency/ doe/reviewed the need and the process to achieve and maintain neg calorie balance > defer f/u primary care including intermittently monitoring thyroid status

## 2016-01-04 NOTE — Progress Notes (Addendum)
Subjective:    Patient ID: Bryan Becker, male    DOB: August 17, 1960    MRN: [id number]  HPI  25 yowm quit smoking 2004 with bad am cough which resolved with doe x 100 ft medications helped some but gradually worse to point where doe room to room so referred to pulmonary clinic 01/04/2016 by Dr York Ram.   01/04/2016 1st [place] office visit/ Wert  spiriva in am/ breo in pm  Chief Complaint  Patient presents with  . Advice Only    Referred by Dr. Jimmye Norman for COPD.  Pt is switching care from a pulmonologist in [place].    cough is dry /daytime x years  has prn tank and 02 2.5 lpm plus bipap and sleeps fine  Breathing no better on saba/ nor is cough  - doe best days = MMRC4  = sob if tries to leave home or while getting dressed    No obvious day to day or daytime variability or assoc excess/ purulent sputum or mucus plugs or hemoptysis or cp  or overt sinus or hb symptoms. No unusual exp hx or h/o childhood pna/ asthma or knowledge of premature birth.  Sleeping ok without nocturnal  or early am exacerbation  of respiratory  c/o's or need for noct saba. Also denies any obvious fluctuation of symptoms with weather or environmental changes or other aggravating or alleviating factors except as outlined above   Current Medications, Allergies, Complete Past Medical History, Past Surgical History, Family History, and Social History were reviewed in [employer] record.          Review of Systems  Constitutional: Negative for fever and unexpected weight change.  HENT: Negative for congestion, dental problem, ear pain, nosebleeds, postnasal drip, rhinorrhea, sinus pressure, sneezing, sore throat and trouble swallowing.   Eyes: Negative for redness and itching.  Respiratory: Positive for chest tightness, shortness of breath and wheezing. Negative for cough.   Cardiovascular: Negative for palpitations and leg swelling.  Gastrointestinal: Negative for nausea  and vomiting.  Genitourinary: Negative for dysuria.  Musculoskeletal: Negative for joint swelling.  Skin: Negative for rash.  Neurological: Negative for headaches.  Hematological: Does not bruise/bleed easily.  Psychiatric/Behavioral: Negative for dysphoric mood. The patient is not nervous/anxious.        Objective:   Physical Exam   amb wm nad with dry cough  - walks with cane/ has home nurse with him     Wt Readings from Last 3 Encounters:  01/04/16 284 lb 12.8 oz (129.2 kg)  08/08/15 265 lb (120.2 kg)  06/22/15 260 lb (117.9 kg)    Vital signs reviewed - note elevated bp p am meds    HEENT: nl dentition, turbinates, and oropharynx. Nl external ear canals without cough reflex   NECK :  without JVD/Nodes/TM/ nl carotid upstrokes bilaterally   LUNGS: no acc muscle use,  Nl contour chest which is clear to A and P bilaterally without cough on insp or exp maneuvers   CV:  RRR  no s3 or murmur or increase in P2, no edema   ABD: tensely obese  soft and nontender with nl inspiratory excursion in the supine position. No bruits or organomegaly, bowel sounds nl  MS:  Nl gait/ ext warm without deformities, calf tenderness, cyanosis or clubbing No obvious joint restrictions   SKIN: warm and dry without lesions    NEURO:  alert, approp, nl sensorium with  no motor deficits     I personally  reviewed images and agree with radiology impression as follows:  CXR:   06/22/15 No acute changes/ no typical of copd / no hyperinflation      Assessment & Plan:

## 2016-01-04 NOTE — Assessment & Plan Note (Signed)
  When respiratory symptoms begin or become refractory well after a patient reports complete smoking cessation,  Especially when this wasn't the case while they were smoking, a red flag is raised based on the work of Dr Kris Mouton which states:  if you quit smoking when your best day FEV1 is still well preserved it is highly unlikely you will progress to severe disease.  That is to say, once the smoking stops,  the symptoms should not suddenly erupt or markedly worsen.  If so, the differential diagnosis should include  obesity/deconditioning,  LPR/Reflux/Aspiration syndromes,  occult CHF, or  especially side effect of medications commonly used in this population, esp acei inhibitors but paradoxically also dpi's, esp in pt's on acei  rec first try off acei then regroup with pfts  In 6 weeks  Total time devoted to counseling  = 35/21mreview case with pt/ discussion of options/alternatives/ personally creating written instructions  in presence of pt  then going over those specific  Instructions directly with the pt including how to use all of the meds but in particular covering each new medication in detail and the difference between the maintenance/automatic meds and the prns using an action plan format for the latter.

## 2016-01-04 NOTE — Patient Instructions (Addendum)
Stop lisinopril and start ibesartan 300 mg daily - reduce to one half if too strong   Plan A = Automatic = spiriva / breo each am   Plan B = Backup Only use your albuterol as a rescue medication to be used if you can't catch your breath by resting or doing a relaxed purse lip breathing pattern.  - The less you use it, the better it will work when you need it. - Ok to use the inhaler up to 2 puffs  every 4 hours if you must but call for appointment if use goes up over your usual need - Don't leave home without it !!  (think of it like the spare tire for your car)   Plan C = Crisis - only use your albuterol nebulizer if you first try Plan B and it fails to help > ok to use the nebulizer up to every 4 hours but if start needing it regularly call for immediate appointment      Please schedule a follow up office visit in 6 weeks, call sooner if needed with pfts on return - ok to schedule at [facility]

## 2016-01-09 DIAGNOSIS — G4733 Obstructive sleep apnea (adult) (pediatric): Secondary | ICD-10-CM | POA: Diagnosis not present

## 2016-01-09 NOTE — Procedures (Signed)
Patient Name: Bryan Becker, Bryan Becker Date: 01/04/2016 Gender: Male D.O.B: [birth year] Age (years): 55 Referring Provider: Harvie Junior Height (inches): 67 Interpreting Physician: Baird Lyons MD, ABSM Weight (lbs): 284 RPSGT: Earney [place] BMI: 43 MRN: [id number] Neck Size: 22.00 CLINICAL INFORMATION Sleep Study Type: Split Night CPAP   Indication for sleep study: Fatigue, OSA   Epworth Sleepiness Score:  21/24 SLEEP STUDY TECHNIQUE As per the AASM Manual for the Scoring of Sleep and Associated Events v2.3 (April 2016) with a hypopnea requiring 4% desaturations. The channels recorded and monitored were frontal, central and occipital EEG, electrooculogram (EOG), submentalis EMG (chin), nasal and oral airflow, thoracic and abdominal wall motion, anterior tibialis EMG, snore microphone, electrocardiogram, and pulse oximetry. Continuous positive airway pressure (CPAP) was initiated when the patient met split night criteria and was titrated according to treat sleep-disordered breathing.  MEDICATIONS Medications taken by the patient : charted for review Medications administered by patient during sleep study : none reported.  RESPIRATORY PARAMETERS Diagnostic Total AHI (/hr): 26.5 RDI (/hr): 30.4 OA Index (/hr): 6.2 CA Index (/hr): 0.0 REM AHI (/hr): 60.0 NREM AHI (/hr): 26.2 Supine AHI (/hr): N/A Non-supine AHI (/hr): 26.46 Min O2 Sat (%): 82.00 Mean O2 (%): 91.09 Time below 88% (min): 10.7   Titration Optimal Pressure (cm): 14 AHI at Optimal Pressure (/hr): 0.0 Min O2 at Optimal Pressure (%): 94.0 Supine % at Optimal (%): 33 Sleep % at Optimal (%): 67    SLEEP ARCHITECTURE The recording time for the entire night was 365.9 minutes. During a baseline period of 169.2 minutes, the patient slept for 136.1 minutes in REM and nonREM, yielding a sleep efficiency of 80.4%. Sleep onset after lights out was 14.2 minutes with a REM latency of 90.0 minutes. The patient spent 1.84% of  the night in stage N1 sleep, 97.43% in stage N2 sleep, 0.00% in stage N3 and 0.73% in REM. During the titration period of 191.9 minutes, the patient slept for 155.2 minutes in REM and nonREM, yielding a sleep efficiency of 80.9%. Sleep onset after CPAP initiation was 3.7 minutes with a REM latency of 100.5 minutes. The patient spent 2.58% of the night in stage N1 sleep, 83.25% in stage N2 sleep, 0.00% in stage N3 and 14.17% in REM.  CARDIAC DATA The 2 lead EKG demonstrated sinus rhythm. The mean heart rate was 83.02 beats per minute. Other EKG findings include: None.  LEG MOVEMENT DATA The total Periodic Limb Movements of Sleep (PLMS) were 0. The PLMS index was 0.00 .  IMPRESSIONS - Moderate obstructive sleep apnea occurred during the diagnostic portion of the study(AHI = 26.5/hour). An optimal PAP pressure was selected for this patient ( 14 cm of water) - No significant central sleep apnea occurred during the diagnostic portion of the study (CAI = 0.0/hour). - Moderate oxygen desaturation was noted during the diagnostic portion of the study (Min O2 =82.00%). - The patient snored with Moderate snoring volume during the diagnostic portion of the study. - No cardiac abnormalities were noted during this study. - Clinically significant periodic limb movements did not occur during sleep.  DIAGNOSIS - Obstructive Sleep Apnea (327.23 [G47.33 ICD-10])  RECOMMENDATIONS - Trial of CPAP therapy on 14 cm H2O with a Small size Fisher&Paykel Full Face Mask Simplus mask and heated humidification. - Avoid alcohol, sedatives and other CNS depressants that may worsen sleep apnea and disrupt normal sleep architecture. - Sleep hygiene should be reviewed to assess factors that may improve sleep quality. - Weight management and regular  exercise should be initiated or continued.  [Electronically signed] 01/09/2016 10:58 AM  Baird Lyons MD, ABSM Diplomate, American Board of Sleep Medicine   NPI:  [id number]  [place], American Board of Sleep Medicine  ELECTRONICALLY SIGNED ON:  01/09/2016, 10:54 AM [place] PH: [phone]   FX: [phone] [place]

## 2016-01-20 DIAGNOSIS — M25562 Pain in left knee: Secondary | ICD-10-CM | POA: Diagnosis not present

## 2016-01-20 DIAGNOSIS — M170 Bilateral primary osteoarthritis of knee: Secondary | ICD-10-CM | POA: Diagnosis not present

## 2016-01-20 DIAGNOSIS — M25561 Pain in right knee: Secondary | ICD-10-CM | POA: Diagnosis not present

## 2016-01-20 DIAGNOSIS — M25552 Pain in left hip: Secondary | ICD-10-CM | POA: Diagnosis not present

## 2016-01-20 DIAGNOSIS — I69354 Hemiplegia and hemiparesis following cerebral infarction affecting left non-dominant side: Secondary | ICD-10-CM | POA: Diagnosis not present

## 2016-01-21 DIAGNOSIS — N3020 Other chronic cystitis without hematuria: Secondary | ICD-10-CM | POA: Diagnosis not present

## 2016-01-21 DIAGNOSIS — N318 Other neuromuscular dysfunction of bladder: Secondary | ICD-10-CM | POA: Diagnosis not present

## 2016-01-21 DIAGNOSIS — N401 Enlarged prostate with lower urinary tract symptoms: Secondary | ICD-10-CM | POA: Diagnosis not present

## 2016-01-21 DIAGNOSIS — N3941 Urge incontinence: Secondary | ICD-10-CM | POA: Diagnosis not present

## 2016-01-21 DIAGNOSIS — R351 Nocturia: Secondary | ICD-10-CM | POA: Diagnosis not present

## 2016-01-21 DIAGNOSIS — N3281 Overactive bladder: Secondary | ICD-10-CM | POA: Diagnosis not present

## 2016-02-01 IMAGING — CR DG CHEST 2V
2 series · 2 of 2 positions shown · non-contrast
Comparison: 10/24/2013

CLINICAL DATA: Right-sided chest pain.  Vomiting.

EXAM:
CHEST  2 VIEW

[w chest pa]
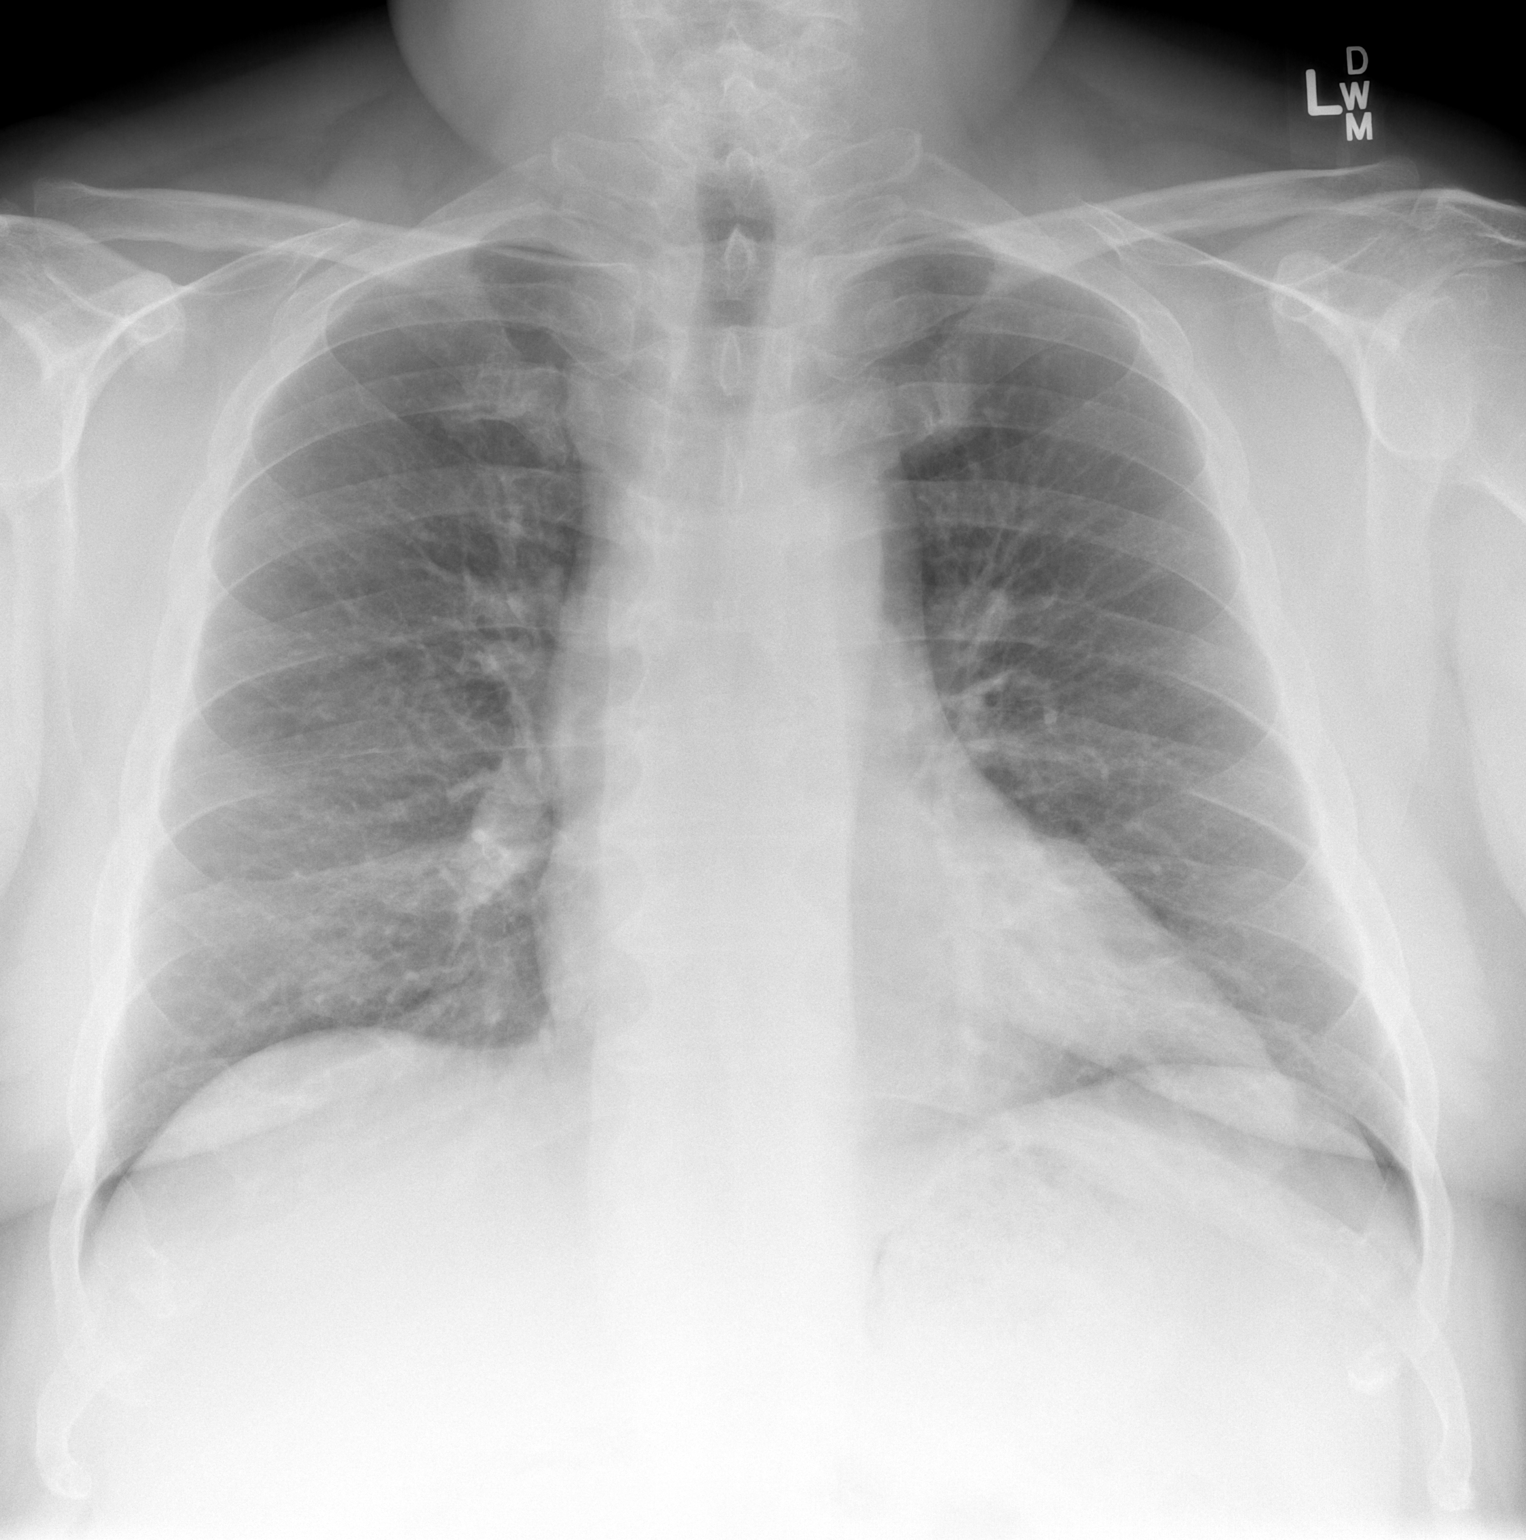

[w chest lat]
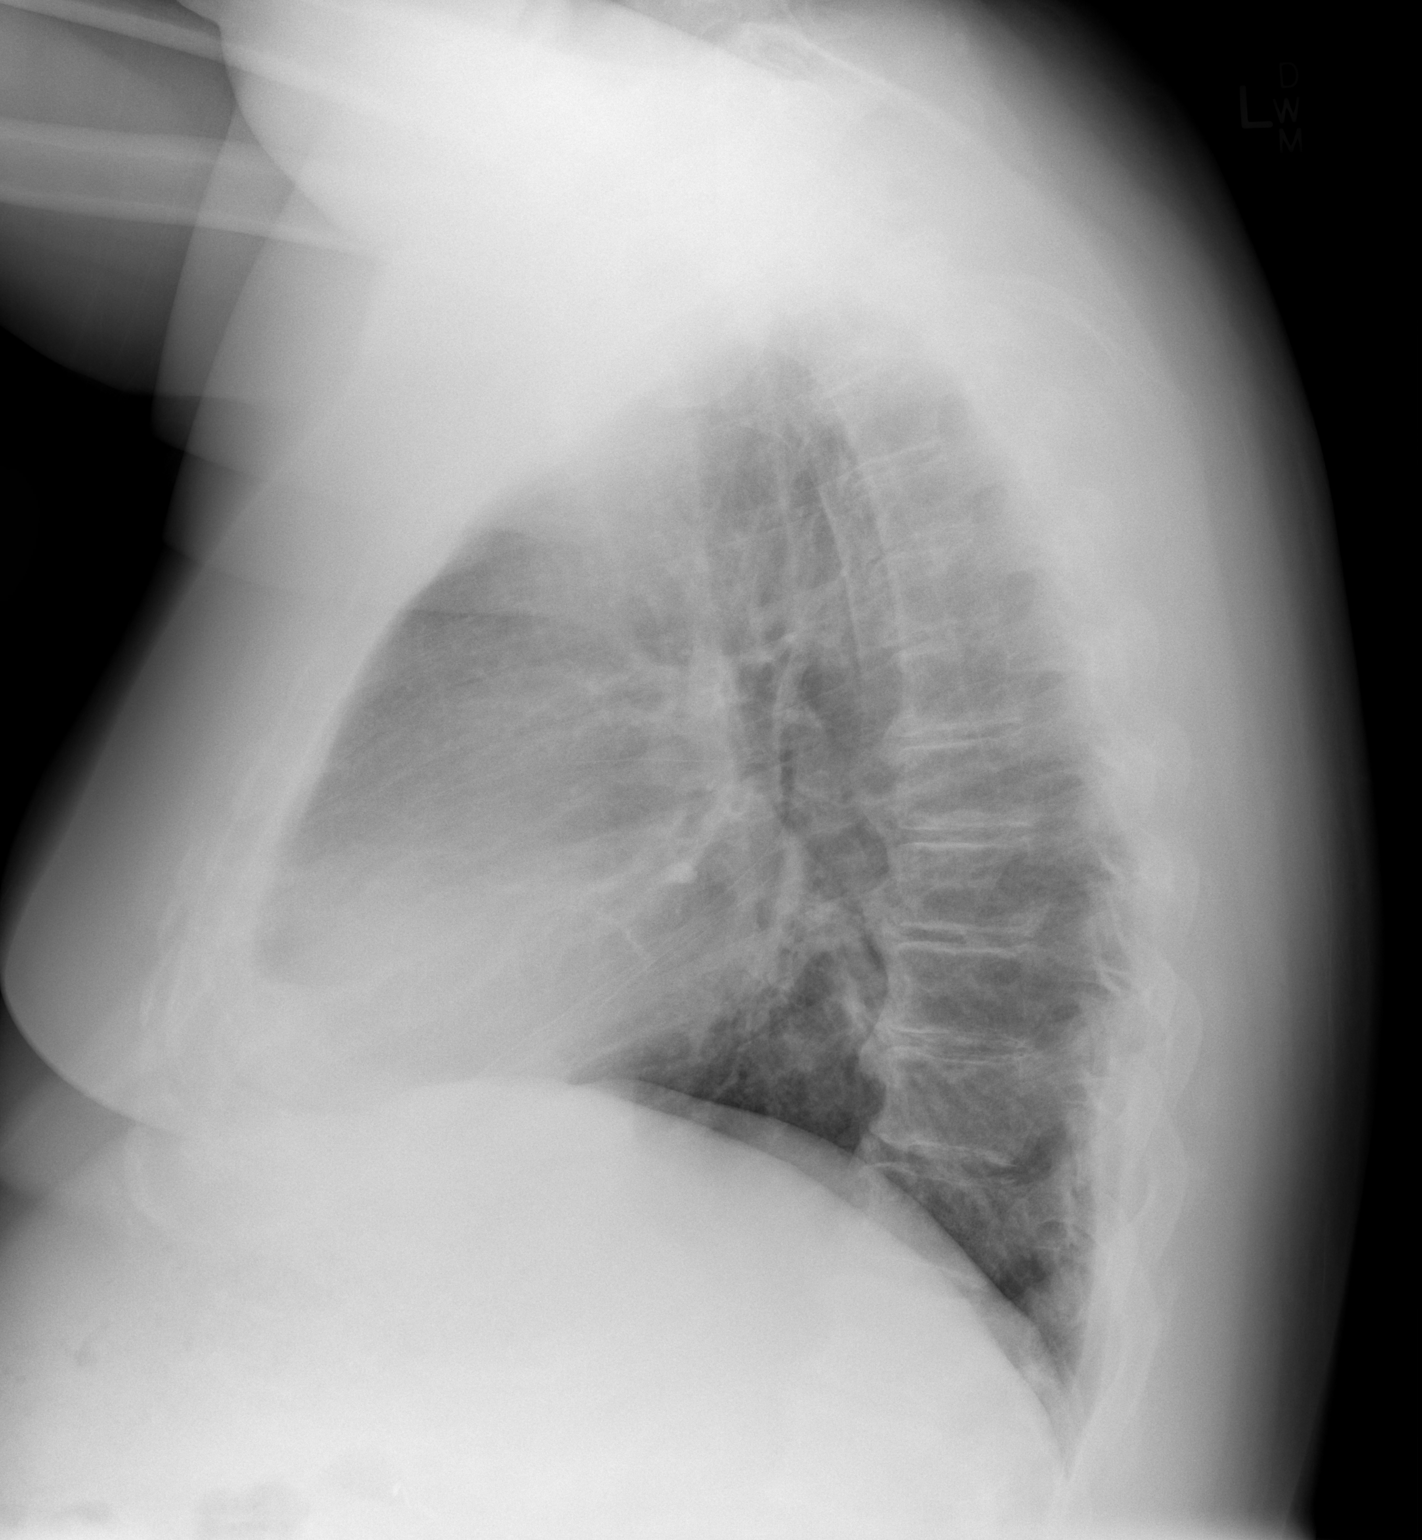

[2 of 2 positions shown; findings below may reference images not displayed]

FINDINGS: The heart size and mediastinal contours are within normal limits.
Both lungs are clear. The visualized skeletal structures are
unremarkable.
IMPRESSION: No active cardiopulmonary disease.

## 2016-02-11 ENCOUNTER — Ambulatory Visit (HOSPITAL_COMMUNITY)
Admission: RE | Admit: 2016-02-11 | Discharge: 2016-02-11 | Disposition: A | Discharge status: Home / Self Care | Payer: Medicare Other | Source: Ambulatory Visit | Attending: Internal Medicine | Admitting: Internal Medicine

## 2016-02-11 DIAGNOSIS — J449 Chronic obstructive pulmonary disease, unspecified: Secondary | ICD-10-CM | POA: Insufficient documentation

## 2016-02-11 DIAGNOSIS — R942 Abnormal results of pulmonary function studies: Secondary | ICD-10-CM | POA: Insufficient documentation

## 2016-02-11 LAB — PULMONARY FUNCTION TEST
DL/VA % PRED: 117 %
DL/VA: 5.38 ml/min/mmHg/L
DLCO unc % pred: 62 %
DLCO unc: 19.39 ml/min/mmHg
FEF 25-75 POST: 1.29 L/s
FEF 25-75 Pre: 0.92 L/sec
FEF2575-%Change-Post: 40 %
FEF2575-%Pred-Post: 42 %
FEF2575-%Pred-Pre: 30 %
FEV1-%Change-Post: 14 %
FEV1-%PRED-PRE: 41 %
FEV1-%Pred-Post: 47 %
FEV1-PRE: 1.5 L
FEV1-Post: 1.72 L
FEV1FVC-%CHANGE-POST: -4 %
FEV1FVC-%Pred-Pre: 75 %
FEV6-%CHANGE-POST: 20 %
FEV6-%PRED-POST: 69 %
FEV6-%PRED-PRE: 57 %
FEV6-POST: 3.13 L
FEV6-Pre: 2.6 L
FEV6FVC-%Pred-Post: 104 %
FEV6FVC-%Pred-Pre: 104 %
FVC-%Change-Post: 20 %
FVC-%PRED-POST: 66 %
FVC-%PRED-PRE: 55 %
FVC-POST: 3.13 L
FVC-PRE: 2.6 L
POST FEV6/FVC RATIO: 100 %
PRE FEV1/FVC RATIO: 58 %
PRE FEV6/FVC RATIO: 100 %
Post FEV1/FVC ratio: 55 %
RV % pred: 110 %
RV: 2.34 L
TLC % PRED: 66 %
TLC: 4.53 L

## 2016-02-11 MED ORDER — ALBUTEROL SULFATE (2.5 MG/3ML) 0.083% IN NEBU
2.5000 mg | INHALATION_SOLUTION | Freq: Once | RESPIRATORY_TRACT | Status: AC
  Administered 2016-02-11: 2.5 mg via RESPIRATORY_TRACT

## 2016-02-15 ENCOUNTER — Ambulatory Visit: Payer: 59 | Admitting: Internal Medicine

## 2016-02-15 NOTE — Progress Notes (Signed)
Spoke with pt and notified of results per Dr. Wert. Pt verbalized understanding and denied any questions. 

## 2016-02-16 DIAGNOSIS — L578 Other skin changes due to chronic exposure to nonionizing radiation: Secondary | ICD-10-CM | POA: Diagnosis not present

## 2016-02-16 DIAGNOSIS — L814 Other melanin hyperpigmentation: Secondary | ICD-10-CM | POA: Diagnosis not present

## 2016-02-16 DIAGNOSIS — L821 Other seborrheic keratosis: Secondary | ICD-10-CM | POA: Diagnosis not present

## 2016-02-17 ENCOUNTER — Ambulatory Visit: Payer: Medicaid Other | Admitting: Internal Medicine

## 2016-02-17 IMAGING — MR MR HEAD W/O CM
9 of 11 series · 28 of 48 positions shown · non-contrast
Comparison: Head CT 12/04/2013 and MRI 04/21/2013

CLINICAL DATA: Sudden onset of right arm and leg weakness with
facial numbness.

EXAM:
MRI HEAD WITHOUT CONTRAST
TECHNIQUE: Multiplanar, multiecho pulse sequences of the brain and surrounding
structures were obtained without intravenous contrast.

[Series 2: FLAIR · sagittal · 5.0mm · 0.47mm/px · 2 of 24 slices shown (1 of 2)]
[im 1/24]
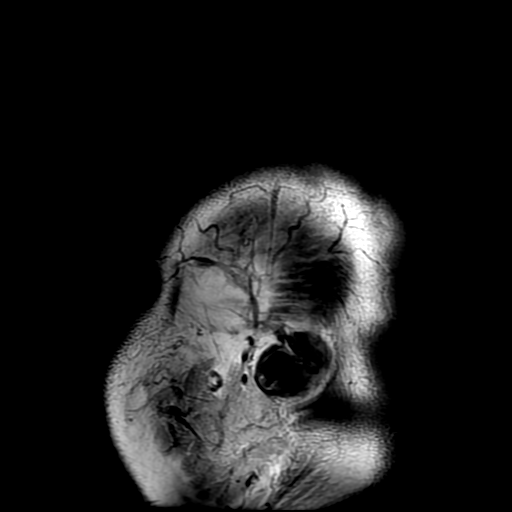
[im 24/24]
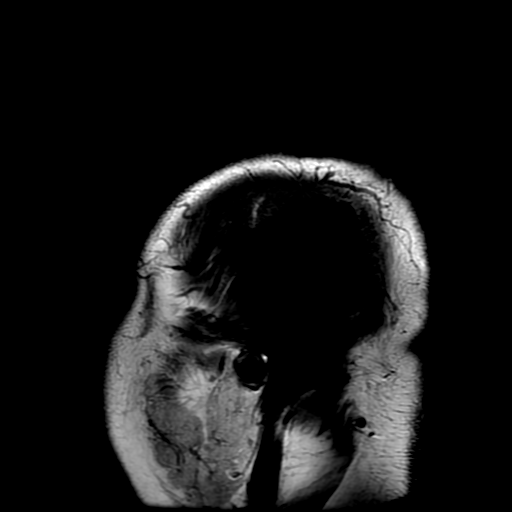

[Series 4: DWI · axial · 5.0mm · 1.02mm/px · z∈[-86,+64]mm · 4 of 62 slices shown (1 of 4)]
[im 1/62]
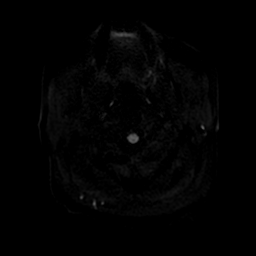
[im 21/62]
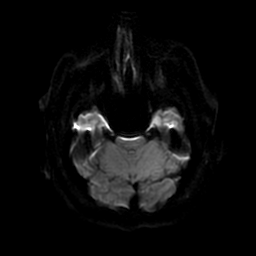
[im 41/62]
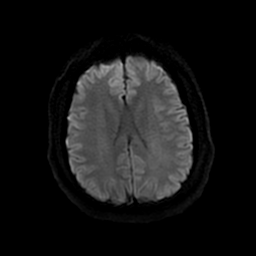
[im 62/62]
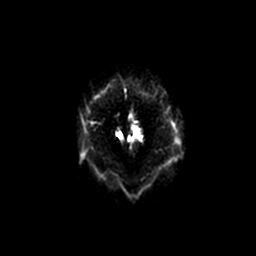

[Series 5: T2 · axial · 5.0mm · 0.43mm/px · z∈[-87,+63]mm · 2 of 26 slices shown (1 of 2)]
[im 1/26]
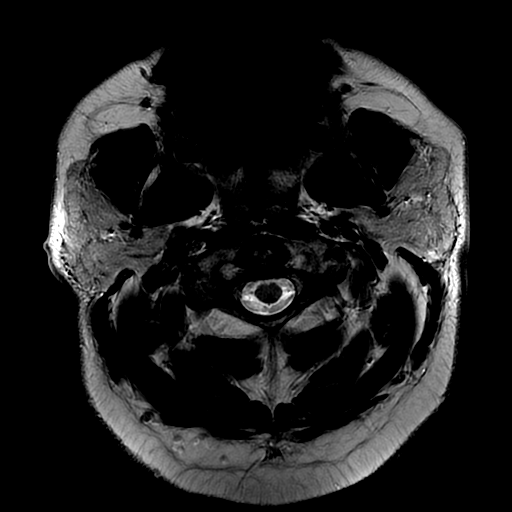
[im 26/26]
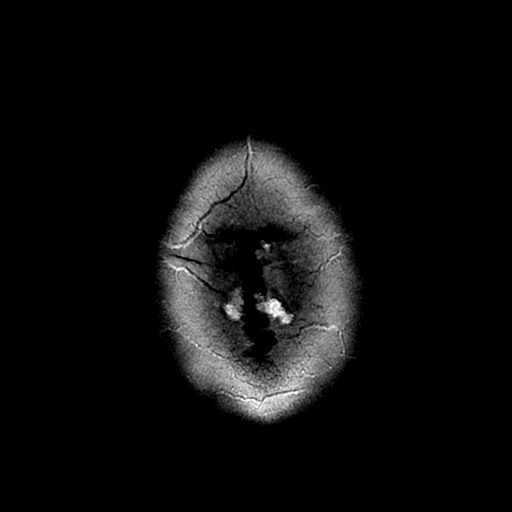

[Series 6: FLAIR · axial · 5.0mm · 0.43mm/px · z∈[-87,+63]mm · 2 of 26 slices shown (2 of 2)]
[im 1/26]
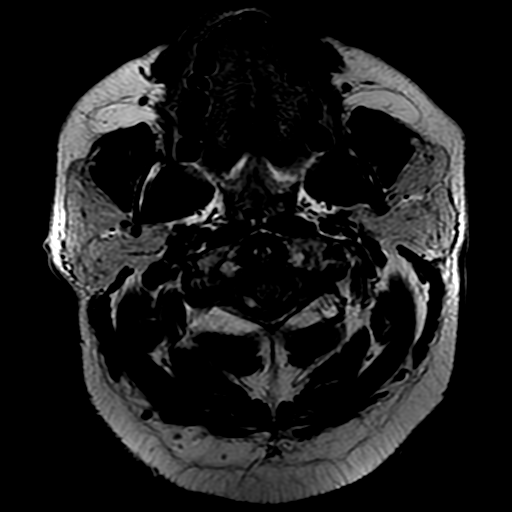
[im 26/26]
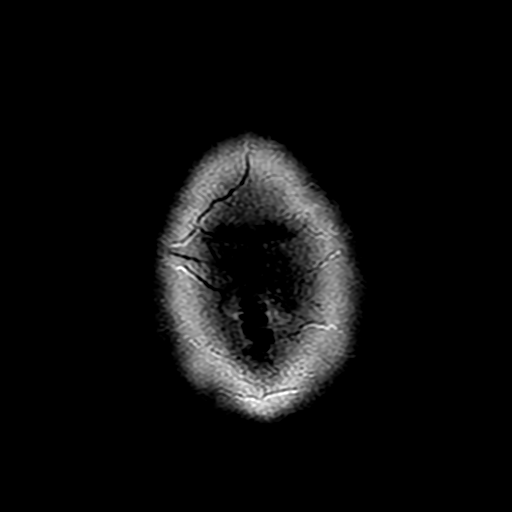

[Series 7: DWI · coronal · 5.0mm · 1.02mm/px · 5 of 68 slices shown (2 of 4)]
[im 1/68]
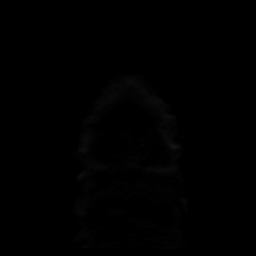
[im 17/68]
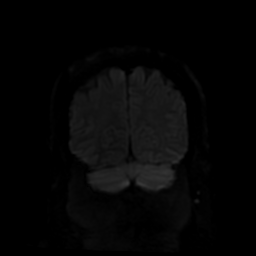
[im 34/68]
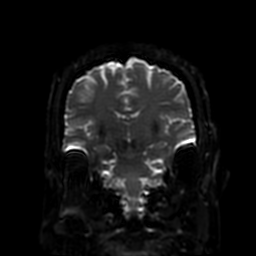
[im 51/68]
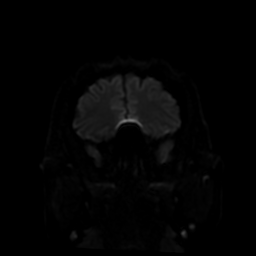
[im 68/68]
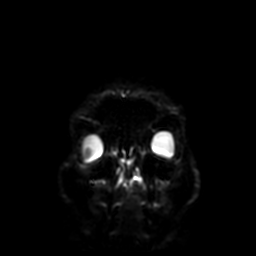

[Series 8: (person_name) · axial · 3.6mm · 0.47mm/px · z∈[-84,+38]mm · 7 of 168 slices shown]
[im 1/168]
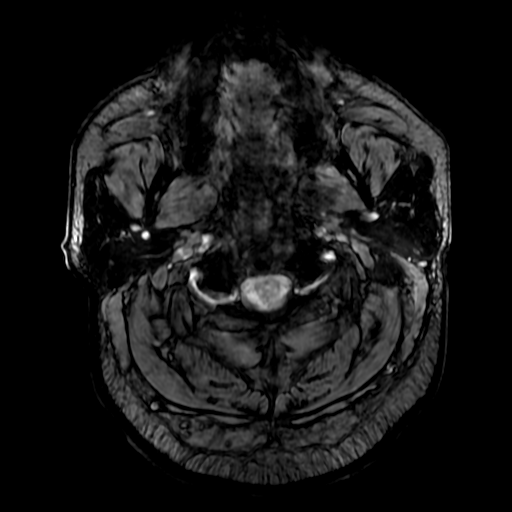
[im 31/168]
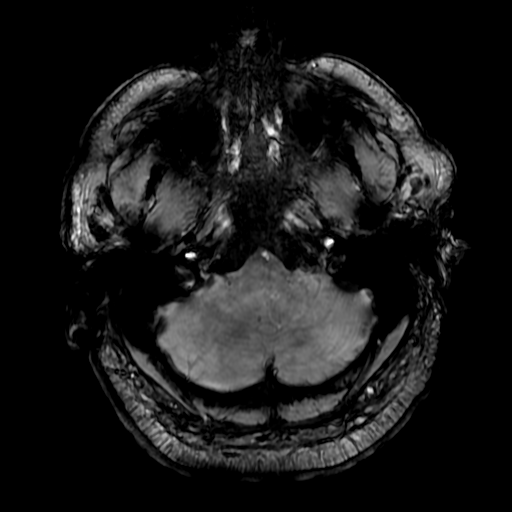
[im 46/168]
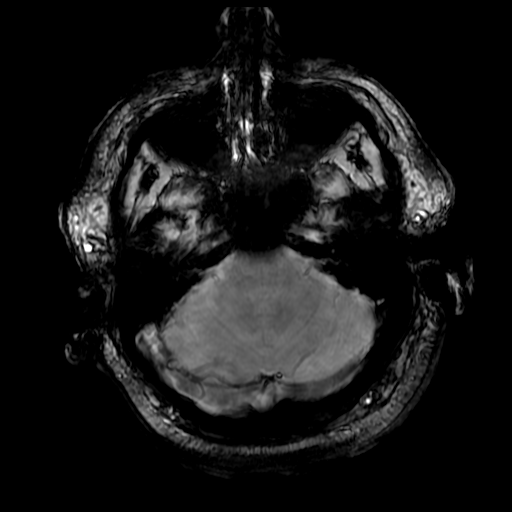
[im 76/168]
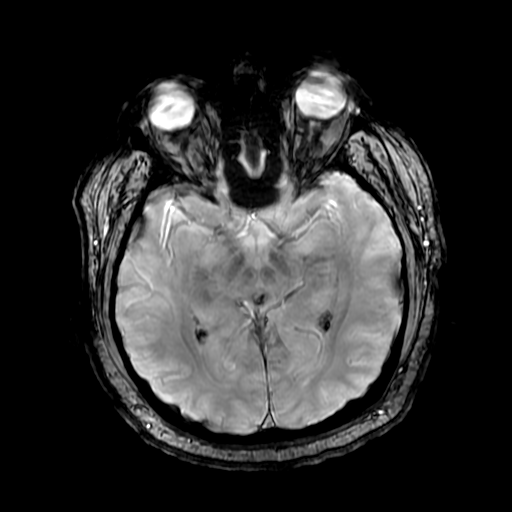
[im 92/168]
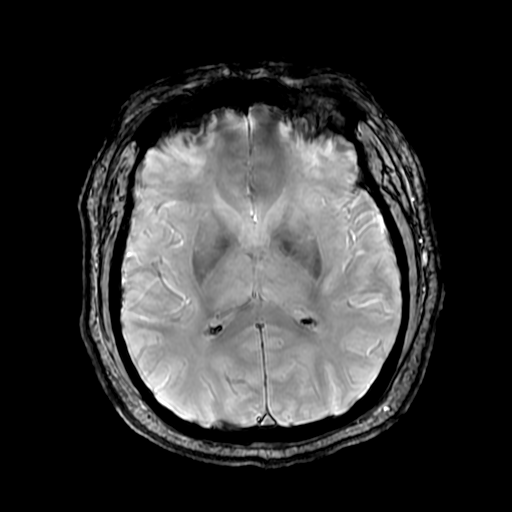
[im 122/168]
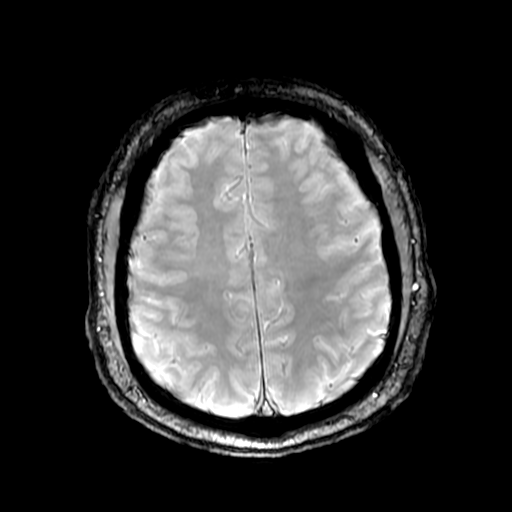
[im 137/168]
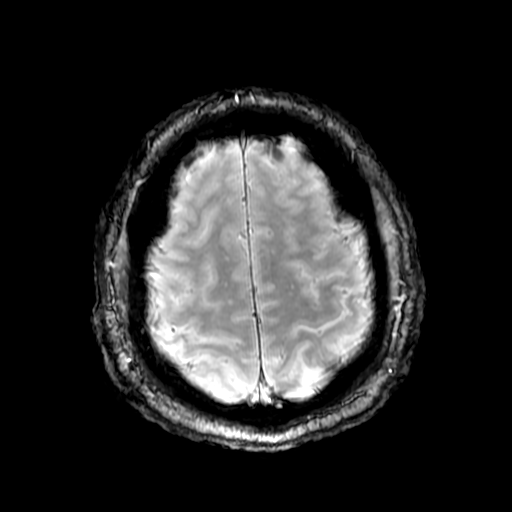

[Series 10: T2 · coronal · 5.0mm · 0.47mm/px · 2 of 29 slices shown (2 of 2)]
[im 1/29]
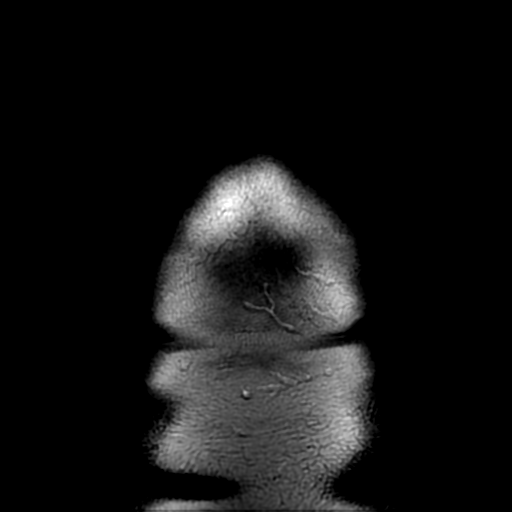
[im 29/29]
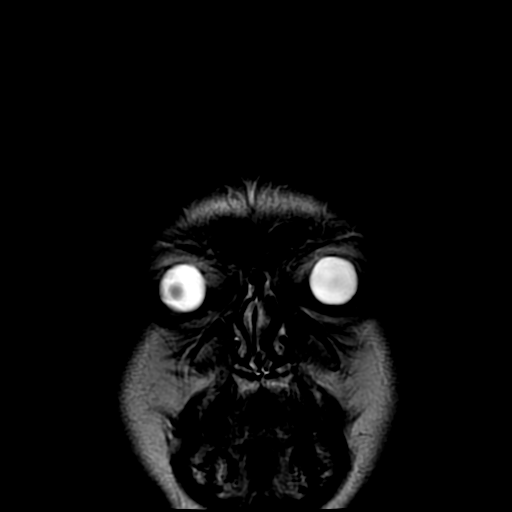

[Series 400: DWI · axial · 5.0mm · 1.02mm/px · z∈[-86,+64]mm · 2 of 31 slices shown (3 of 4)]
[im 1/31]
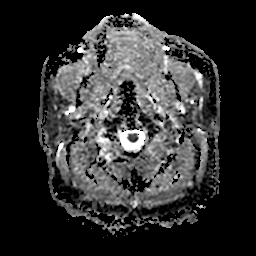
[im 31/31]
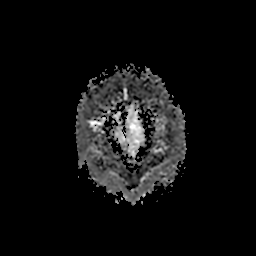

[Series 700: DWI · coronal · 5.0mm · 1.02mm/px · 2 of 34 slices shown (4 of 4)]
[im 1/34]
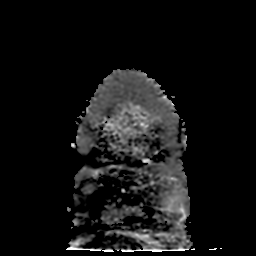
[im 34/34]
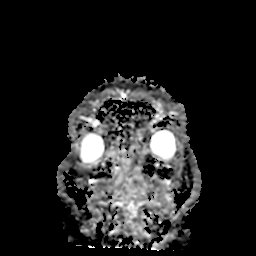

[28 of 48 positions shown; findings below may reference images not displayed]

FINDINGS: There is no acute infarct. Ventricles and sulci are normal for age.
There is no evidence of intracranial hemorrhage, mass, midline
shift, or extra-axial fluid collection. No brain parenchymal signal
abnormality is identified.

Orbits are unremarkable. Paranasal sinuses are clear. There is a
small right mastoid effusion. Major intracranial vascular flow voids
are preserved. Calvarium and scalp soft tissues are unremarkable.
IMPRESSION: Unremarkable appearance of the brain without evidence of acute
intracranial abnormality.

## 2016-02-26 ENCOUNTER — Telehealth: Payer: Self-pay | Admitting: Cardiovascular Disease

## 2016-02-26 NOTE — Telephone Encounter (Signed)
Received records from R.E. Williford M.D. For apt with Dr. Loletha Grayer. On 02/29/2016 9:30am. Records given to [facility] (medical records) 02/26/2016 mwc

## 2016-02-29 ENCOUNTER — Encounter: Payer: Self-pay | Admitting: Cardiovascular Disease

## 2016-02-29 ENCOUNTER — Ambulatory Visit (INDEPENDENT_AMBULATORY_CARE_PROVIDER_SITE_OTHER): Payer: Medicare Other | Admitting: Cardiovascular Disease

## 2016-02-29 VITALS — BP 140/80 | Ht 69.0 in | Wt 290.0 lb

## 2016-02-29 DIAGNOSIS — R0602 Shortness of breath: Secondary | ICD-10-CM

## 2016-02-29 DIAGNOSIS — E781 Pure hyperglyceridemia: Secondary | ICD-10-CM | POA: Diagnosis not present

## 2016-02-29 DIAGNOSIS — I779 Disorder of arteries and arterioles, unspecified: Secondary | ICD-10-CM

## 2016-02-29 DIAGNOSIS — G4733 Obstructive sleep apnea (adult) (pediatric): Secondary | ICD-10-CM

## 2016-02-29 DIAGNOSIS — Z87891 Personal history of nicotine dependence: Secondary | ICD-10-CM | POA: Diagnosis not present

## 2016-02-29 DIAGNOSIS — Z136 Encounter for screening for cardiovascular disorders: Secondary | ICD-10-CM

## 2016-02-29 DIAGNOSIS — Z79899 Other long term (current) drug therapy: Secondary | ICD-10-CM

## 2016-02-29 DIAGNOSIS — I739 Peripheral vascular disease, unspecified: Secondary | ICD-10-CM

## 2016-02-29 DIAGNOSIS — J449 Chronic obstructive pulmonary disease, unspecified: Secondary | ICD-10-CM

## 2016-02-29 DIAGNOSIS — R072 Precordial pain: Secondary | ICD-10-CM | POA: Diagnosis not present

## 2016-02-29 MED ORDER — FUROSEMIDE 40 MG PO TABS: ORAL_TABLET | ORAL | 11 refills | Status: DC

## 2016-02-29 NOTE — Progress Notes (Signed)
Cardiology Consultation Note    Date:  03/01/2016   ID:  Bryan Becker, DOB 1961/05/12, MRN [id number]  PCP:  Harvie Junior, MD  Cardiologist:   Sanda Klein, MD   Chief Complaint  Patient presents with  . New Evaluation    pt c/o chest tightness,   . Chest Pain  . Shortness of Breath    History of Present Illness:  Bryan Becker is a 55 y.o. male with morbid obesity, previous stroke, insulin requiring type II diabetes mellitus, hypertriglyceridemia, COPD, obstructive sleep apnea by recentsleep study who presents with complaints of chest tightness and shortness of breath.  He often describes chest tightness with walking, but sometimes this will also occur at rest. He complains of shortness of breath with minimal activity, substantially worse recently. He has gained roughly 18 pounds on his home scale over the last couple of months. As his weight has increased so has his shortness of breath and his chest tightness. He has had minimal ankle edema. He does complain of abdominal distention and early satiety. His dyspnea improved after discontinuation of lisinopril, but the chest pain has worsened. On his home scale his usual weight when he felt better was 262 pounds.  He tells me that he was diagnosed with "heart failure" years ago during a hospitalization at [facility]. I can't really find a record of that diagnosis. He did have an echocardiogram in 2014 when he was admitted with a stroke. That echo showed normal left ventricular systolic function and mild left ventricular hypertrophy, without a comment about diastolic function. That same admission he had bilateral carotid duplex ultrasonography showed only 40-59% left carotid stenosis and a 1-39% right carotid stenosis (he had left-sided weakness which would not fit with the more severe left-sided stenosis).  He has recently been diagnosed with obstructive sleep apnea but has not started CPAP. PFTs confirm the presence of moderate COPD (FEV1  41%).Marland Kitchen He quit smoking about 15 years ago.  Two family members have had abdominal aortic aneurysms, including his father. He has never had screening ultrasound for abdominal aortic aneurysm. He denies abdominal pain, just has bloating.    Past Medical History:  Diagnosis Date  . Anxiety   . Arthritis    "joints"  . Asthma   . CHF (congestive heart failure) ([place])   . Chronic back pain    "mostly lower; have 2 bulging discs in my neck" (12/05/2013)  . Chronic bronchitis ([place])    "get it q yr"  . 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 Confusion   . COPD (chronic obstructive pulmonary disease) ([place])   . Dementia    onset of early  . Dementia   . Depression   . Fall at home 06/04/14   Pt stated he falls off the bed at nightly  . Family history of adverse reaction to anesthesia    Patients mother had a bad reaction to Anesthesia however pt is unaware of reaction  . Fibromyalgia   . Frequent urination   . Gastric ulcer   . GERD (gastroesophageal reflux disease)   . KXFGHWEX(937.1)    "weekly" (12/04/2013)  . Hyperlipidemia   . Hypertension   . Kidney stone   . Myocardial infarction    unsure about MI history, but had normal cath that admission '07 ([facility])  . Nausea & vomiting   . OSA (obstructive sleep apnea) 04/24/2013   "use BiPAP"  . Pancreatitis   . Pneumonia    "  several times"  . RMSF [facility] spotted fever) 11/22/2012  . Seasonal allergies   . Shortness of breath dyspnea    with ambulation  . Stroke [facility]) 03/2013   "still weak on the left side" (12/05/2013)  . Stroke [facility]) 12/04/2013   "came in today w/right sided weakness" (12/04/2013)  . Type II diabetes mellitus ([place])     Past Surgical History:  Procedure Laterality Date  . CARDIAC CATHETERIZATION     10/28/05: right dominant, normal LV systolic function, normal coronaries. (Dr. Baxter Hire, [facility])  . CHOLECYSTECTOMY    . COLONOSCOPY    . CYSTOSCOPY W/ STONE MANIPULATION    . LUMBAR  LAMINECTOMY/DECOMPRESSION MICRODISCECTOMY Left 06/18/2014   Procedure: LUMBAR LAMINECTOMY/DECOMPRESSION MICRODISCECTOMY LEFT LUMBAR FIVE-SACRAL ONE;  Surgeon: Ashok Pall, MD;  Location: [place] NEURO ORS;  Service: Neurosurgery;  Laterality: Left;  left  . multiple cysts removal-hip,wrist    . TONSILLECTOMY  ~ 1985  . UPPER GASTROINTESTINAL ENDOSCOPY      Current Medications: Outpatient Medications Prior to Visit  Medication Sig Dispense Refill  . albuterol (ACCUNEB) 1.25 MG/3ML nebulizer solution Take 1 ampule by nebulization every 6 (six) hours as needed for wheezing.    Marland Kitchen albuterol (PROVENTIL HFA;VENTOLIN HFA) 108 (90 BASE) MCG/ACT inhaler Inhale 2 puffs into the lungs every 6 (six) hours as needed. For shortness of breath     . canagliflozin (INVOKANA) 100 MG TABS tablet Take 100 mg by mouth.    . citalopram (CELEXA) 40 MG tablet Take 40 mg by mouth daily.      . clopidogrel (PLAVIX) 75 MG tablet Take 75 mg by mouth daily.    . cyanocobalamin (,VITAMIN B-12,) 1000 MCG/ML injection Inject 1,000 mcg into the muscle once a week.     . cyclobenzaprine (FLEXERIL) 10 MG tablet Take 1 tablet (10 mg total) by mouth 3 (three) times daily as needed for muscle spasms. 60 tablet 0  . docusate sodium (COLACE) 100 MG capsule Take 100 mg by mouth daily as needed for mild constipation.    . fesoterodine (TOVIAZ) 4 MG TB24 tablet Take 4 mg by mouth daily.    . fexofenadine (ALLEGRA) 180 MG tablet Take 180 mg by mouth daily.    . finasteride (PROSCAR) 5 MG tablet Take 5 mg by mouth daily.    . fluticasone (FLONASE) 50 MCG/ACT nasal spray Place 2 sprays into both nostrils daily as needed for allergies.     Marland Kitchen gabapentin (NEURONTIN) 300 MG capsule Take 1,200 mg by mouth 3 (three) times daily.    Marland Kitchen gabapentin (NEURONTIN) 600 MG tablet Take 1,200 mg by mouth 3 (three) times daily.    Marland Kitchen gemfibrozil (LOPID) 600 MG tablet Take 600 mg by mouth daily.    Marland Kitchen glimepiride (AMARYL) 4 MG tablet Take 4 mg by mouth daily with  breakfast.    . Hydrocodone-Acetaminophen 7.5-300 MG TABS Take 1-2 tablets by mouth every 6 (six) hours as needed (pain). 70 each 0  . insulin detemir (LEVEMIR) 100 UNIT/ML injection Inject 60 Units into the skin at bedtime.     . insulin lispro protamine-lispro (HUMALOG 50/50 MIX) (50-50) 100 UNIT/ML SUSP injection Inject 1-10 Units into the skin 2 (two) times daily before a meal.     . irbesartan (AVAPRO) 300 MG tablet Take 1 tablet (300 mg total) by mouth daily. 30 tablet 5  . L-Lysine 1000 MG TABS Take 1,000 mg by mouth daily as needed (for fever blisters).    . Liraglutide 18 MG/3ML SOPN  Inject 1.8 mg into the skin daily.    Marland Kitchen LORazepam (ATIVAN) 1 MG tablet Take 1 mg by mouth every 8 (eight) hours as needed for anxiety.     . memantine (NAMENDA) 5 MG tablet Take 5 mg by mouth daily.    . Memantine HCl (NAMENDA PO) Take 1 tablet by mouth at bedtime.     Marland Kitchen omeprazole (PRILOSEC) 20 MG capsule Take 20 mg by mouth at bedtime.     . ondansetron (ZOFRAN) 4 MG tablet Take 1 tablet (4 mg total) by mouth every 8 (eight) hours as needed for nausea or vomiting. 20 tablet 0  . Potassium Gluconate 595 MG CAPS Take 2 capsules by mouth daily.    . prochlorperazine (COMPAZINE) 25 MG suppository Place 1 suppository (25 mg total) rectally every 12 (twelve) hours as needed for nausea or vomiting. 12 suppository 0  . promethazine (PHENERGAN) 25 MG tablet Take 1 tablet (25 mg total) by mouth every 6 (six) hours as needed for nausea or vomiting. 15 tablet 0  . Specialty Vitamins Products (MAGNESIUM, AMINO ACID CHELATE,) 133 MG tablet Take 1 tablet by mouth 2 (two) times daily.    . sucralfate (CARAFATE) 1 GM/10ML suspension Take 10 mLs (1 g total) by mouth 4 (four) times daily -  with meals and at bedtime. 420 mL 0  . tiotropium (SPIRIVA) 18 MCG inhalation capsule Place 1 capsule (18 mcg total) into inhaler and inhale daily as needed (wheezing). 30 capsule 12  . traMADol (ULTRAM) 50 MG tablet Take 50 mg by mouth  every 8 (eight) hours as needed for moderate pain.     . furosemide (LASIX) 40 MG tablet Take 1 tablet (40 mg total) by mouth daily. (Patient taking differently: Take 40 mg by mouth 2 (two) times daily. ) 30 tablet 0   No facility-administered medications prior to visit.      Allergies:   Actos [pioglitazone]; Aricept [donepezil hcl]; and Codeine   Social History   Social History  . Marital status: Married    Spouse name: N/A  . Number of children: 2  . Years of education: N/A   Occupational History  . unemployed [place] History Main Topics  . Smoking status: Former Smoker    Packs/day: 2.00    Years: 21.00    Types: Cigarettes    Quit date: 05/23/2002  . Smokeless tobacco: Never Used  . Alcohol use No  . Drug use: No  . Sexual activity: Not Currently   Other Topics Concern  . None   Social History Narrative  . None     Family History:  The patient's family history includes Aneurysm in his father, maternal uncle, and maternal uncle; Arthritis in his mother; Bone cancer in his paternal grandmother; Diabetes in his maternal grandmother and mother; Heart attack in his father and paternal grandmother; Heart disease in his maternal grandmother and paternal grandfather; Hypertension in his father and mother; Lung cancer in his paternal grandfather; Other in his mother; Skin cancer in his father; Stomach cancer in his father and maternal grandfather.   ROS:   Please see the history of present illness.    ROS All other systems reviewed and are negative.   PHYSICAL EXAM:   VS:  BP 140/80 (BP Location: Left Arm, Patient Position: Sitting, Cuff Size: Large)   Ht 5' 9"  (1.753 m)   Wt 290 lb (131.5 kg)   SpO2 96%   BMI 42.83 kg/m  GEN: Well nourished, well developed, in no acute distress . Morbid obesity limits the exam HEENT: normal  Neck: no JVD, carotid bruits, or masses Cardiac: RRR; no murmurs, rubs, or gallops,no edema  Respiratory:  clear to  auscultation bilaterally, normal work of breathing GI: soft, nontender, nondistended, + BS MS: no deformity or atrophy  Skin: warm and dry, no rash Neuro:  Alert and Oriented x 3, Strength and sensation are intact Psych: euthymic mood, full affect  Wt Readings from Last 3 Encounters:  02/29/16 290 lb (131.5 kg)  01/05/16 284 lb (128.8 kg)  01/04/16 284 lb 12.8 oz (129.2 kg)      Studies/Labs Reviewed:   EKG:  EKG is ordered today.  The ekg ordered today demonstrates Borderline resting tachycardia (96 bpm), sinus rhythm, ST segment depression and T-wave inversion only in lead 3, QTC 447 ms.  Recent Labs: 11/21/2015: ALT 21; BUN 16; Creatinine, Ser 0.76; Hemoglobin 13.9; Platelets 216; Potassium 3.5; Sodium 135   Lipid Panel    Component Value Date/Time   CHOL 168 12/05/2013 0640   TRIG 597 (H) 12/05/2013 0640   HDL 21 (L) 12/05/2013 0640   CHOLHDL 8.0 12/05/2013 0640   VLDL UNABLE TO CALCULATE IF TRIGLYCERIDE OVER 400 mg/dL 12/05/2013 0640   LDLCALC UNABLE TO CALCULATE IF TRIGLYCERIDE OVER 400 mg/dL 12/05/2013 0640      ASSESSMENT:    1. Precordial pain   2. Shortness of breath   3. Left-sided carotid artery disease ([place])   4. Hypertriglyceridemia   5. Morbid obesity ([place])   6. Obstructive sleep apnea   7. COPD with asthma ([place])   8. History of smoking   9. Screening for AAA (abdominal aortic aneurysm)   10. Medication management      PLAN:  In order of problems listed above:  1. Chest pain: While not truly typical angina, his symptoms are very concerning for coronary insufficiency. This may be related to full-blown coronary artery disease or just subendocardial ischemia in the setting of congestive heart failure. We'll schedule him for a nuclear stress test.  2. Dyspnea: This is multifactorial due to obesity, untreated sleep apnea, COPD, but also likely due to heart failure, presumably diastolic. He needs a repeat echocardiogram. Will increase his furosemide and  potassium supplement. 3. Carotid stenosis: Currently asymptomatic. The more severe stenosis did not coincide with the side of his stroke. Need to keep in mind possibility that his stroke was related to occult atrial fibrillation rather than cerebrovascular atherosclerosis. [place] to reevaluate for progression. 4. HLP: On gemfibrozil. Improved glycemic control will also be to improve triglycerides. 5. Obesity: I asked him if he is ever considered bariatric surgery. He definitely has multiple serious complications of obesity. 6. OSA: Soon to start CPAP 7. COPD: Seeing Dr. Melvyn Novas. Breathing did improve after discontinuation of ACE inhibitor. If we discover that he has depressed LVEF will prefer use of angiotensin receptor blocker. 8. Quit smoking years ago.  9. Will order abdominal ultrasound, hopefully we can get imaging despite his obesity.    Medication Adjustments/Labs and Tests Ordered: Current medicines are reviewed at length with the patient today.  Concerns regarding medicines are outlined above.  Medication changes, Labs and Tests ordered today are listed in the Patient Instructions below. Patient Instructions  Medication Instructions: Dr Sallyanne Kuster has recommended making the following medication changes: 1. INCREASE Furosemide - take 2 tablets (80 mg total) in the morning and take 1 tablet (40 mg total) in the afternoon 2. INCREASE Potassium -  take 2 capsules of your over-the-counter supplement  Labwork: Your physician recommends that you return for lab work in 2 weeks.  Testing/Procedures: 1. [place] Stress Test - Your physician has requested that you have a lexiscan myoview. For further information please visit [email or web address]. Please follow instruction sheet, as given.  2. Echocardiogram - Your physician has requested that you have an echocardiogram. Echocardiography is a painless test that uses sound waves to create images of your heart. It provides your doctor with  information about the size and shape of your heart and how well your heart's chambers and valves are working. This procedure takes approximately one hour. There are no restrictions for this procedure. This will be performed at our [facility] location - [address], [address].  3. Abdominal Aorta Aneurysm Screening - Your physician has requested that you have an abdominal aorta duplex. During this test, an ultrasound is used to evaluate the aorta. Allow 30 minutes for this exam. Do not eat after midnight the day before and avoid carbonated beverages.  4. Carotid Artery Dopplers - Your physician has requested that you have a carotid duplex. This test is an ultrasound of the carotid arteries in your neck. It looks at blood flow through these arteries that supply the brain with blood. Allow one hour for this exam. There are no restrictions or special instructions.  Follow-up: Dr Sallyanne Kuster recommends that you schedule a follow-up appointment in 1 month.  If you need a refill on your cardiac medications before your next appointment, please call your pharmacy.    Signed, Sanda Klein, MD  03/01/2016 6:52 PM    [address], [place], [place]  [address] Phone: [phone]; Fax: [phone]

## 2016-02-29 NOTE — Patient Instructions (Signed)
Medication Instructions: Dr Sallyanne Kuster has recommended making the following medication changes: 1. INCREASE Furosemide - take 2 tablets (80 mg total) in the morning and take 1 tablet (40 mg total) in the afternoon 2. INCREASE Potassium - take 2 capsules of your over-the-counter supplement  Labwork: Your physician recommends that you return for lab work in 2 weeks.  Testing/Procedures: 1. [place] Stress Test - Your physician has requested that you have a lexiscan myoview. For further information please visit [email or web address]. Please follow instruction sheet, as given.  2. Echocardiogram - Your physician has requested that you have an echocardiogram. Echocardiography is a painless test that uses sound waves to create images of your heart. It provides your doctor with information about the size and shape of your heart and how well your heart's chambers and valves are working. This procedure takes approximately one hour. There are no restrictions for this procedure. This will be performed at our [facility] location - [address].  3. Abdominal Aorta Aneurysm Screening - Your physician has requested that you have an abdominal aorta duplex. During this test, an ultrasound is used to evaluate the aorta. Allow 30 minutes for this exam. Do not eat after midnight the day before and avoid carbonated beverages.  4. Carotid Artery Dopplers - Your physician has requested that you have a carotid duplex. This test is an ultrasound of the carotid arteries in your neck. It looks at blood flow through these arteries that supply the brain with blood. Allow one hour for this exam. There are no restrictions or special instructions.  Follow-up: Dr Sallyanne Kuster recommends that you schedule a follow-up appointment in 1 month.  If you need a refill on your cardiac medications before your next appointment, please call your pharmacy.

## 2016-03-02 ENCOUNTER — Other Ambulatory Visit (INDEPENDENT_AMBULATORY_CARE_PROVIDER_SITE_OTHER): Payer: Medicare Other

## 2016-03-02 ENCOUNTER — Ambulatory Visit (INDEPENDENT_AMBULATORY_CARE_PROVIDER_SITE_OTHER): Payer: Medicare Other | Admitting: Internal Medicine

## 2016-03-02 ENCOUNTER — Ambulatory Visit (INDEPENDENT_AMBULATORY_CARE_PROVIDER_SITE_OTHER)
Admission: RE | Admit: 2016-03-02 | Discharge: 2016-03-02 | Disposition: A | Discharge status: Home / Self Care | Payer: Medicare Other | Source: Ambulatory Visit | Attending: Internal Medicine | Admitting: Internal Medicine

## 2016-03-02 ENCOUNTER — Encounter: Payer: Self-pay | Admitting: Internal Medicine

## 2016-03-02 VITALS — BP 124/80 | HR 75 | Ht 69.0 in | Wt 286.8 lb

## 2016-03-02 DIAGNOSIS — R0600 Dyspnea, unspecified: Secondary | ICD-10-CM | POA: Diagnosis not present

## 2016-03-02 DIAGNOSIS — I10 Essential (primary) hypertension: Secondary | ICD-10-CM | POA: Diagnosis not present

## 2016-03-02 DIAGNOSIS — J449 Chronic obstructive pulmonary disease, unspecified: Secondary | ICD-10-CM

## 2016-03-02 DIAGNOSIS — R0602 Shortness of breath: Secondary | ICD-10-CM | POA: Diagnosis not present

## 2016-03-02 LAB — CBC WITH DIFFERENTIAL/PLATELET
BASOS ABS: 0 10*3/uL (ref 0.0–0.1)
Basophils Relative: 0.5 % (ref 0.0–3.0)
EOS PCT: 2.4 % (ref 0.0–5.0)
Eosinophils Absolute: 0.1 10*3/uL (ref 0.0–0.7)
HEMATOCRIT: 44.8 % (ref 39.0–52.0)
Hemoglobin: 15 g/dL (ref 13.0–17.0)
LYMPHS PCT: 39 % (ref 12.0–46.0)
Lymphs Abs: 2.2 10*3/uL (ref 0.7–4.0)
MCHC: 33.5 g/dL (ref 30.0–36.0)
MCV: 86.1 fl (ref 78.0–100.0)
MONOS PCT: 8.4 % (ref 3.0–12.0)
Monocytes Absolute: 0.5 10*3/uL (ref 0.1–1.0)
NEUTROS ABS: 2.8 10*3/uL (ref 1.4–7.7)
Neutrophils Relative %: 49.7 % (ref 43.0–77.0)
PLATELETS: 207 10*3/uL (ref 150.0–400.0)
RBC: 5.2 Mil/uL (ref 4.22–5.81)
RDW: 14.2 % (ref 11.5–15.5)
WBC: 5.7 10*3/uL (ref 4.0–10.5)

## 2016-03-02 LAB — BASIC METABOLIC PANEL
BUN: 13 mg/dL (ref 6–23)
CHLORIDE: 99 meq/L (ref 96–112)
CO2: 31 meq/L (ref 19–32)
Calcium: 9.7 mg/dL (ref 8.4–10.5)
Creatinine, Ser: 0.79 mg/dL (ref 0.40–1.50)
GFR: 107.58 mL/min (ref >60.00)
GLUCOSE: 120 mg/dL — AB (ref 70–99)
POTASSIUM: 3.5 meq/L (ref 3.5–5.1)
SODIUM: 140 meq/L (ref 135–145)

## 2016-03-02 LAB — TSH: TSH: 1.18 u[IU]/mL (ref 0.35–4.50)

## 2016-03-02 LAB — BRAIN NATRIURETIC PEPTIDE: Pro B Natriuretic peptide (BNP): 11 pg/mL (ref 0.0–100.0)

## 2016-03-02 MED ORDER — GLYCOPYRROLATE-FORMOTEROL 9-4.8 MCG/ACT IN AERO: 2.0000 | INHALATION_SPRAY | Freq: Two times a day (BID) | RESPIRATORY_TRACT | 1 refills | Status: DC

## 2016-03-02 NOTE — Progress Notes (Signed)
Subjective:    Patient ID: Bryan Becker, male    DOB: [birth year]    MRN: [id number]    Brief patient profile:  89 yowm quit smoking 2004 with bad am cough which resolved with doe x 100 ft medications helped some but gradually worse to point where doe room to room so referred to pulmonary clinic 01/04/2016 by Dr Bryan Becker with GOLD III copd criteria established 02/11/16     History of Present Illness  01/04/2016 1st [facility] office visit/ Bryan Becker  spiriva in am/ breo in pm  Chief Complaint  Patient presents with  . Advice Only    Referred by Dr. Jimmye Becker for COPD.  Pt is switching care from a pulmonologist in [place].    cough is dry /daytime x years  has prn tank and 02 2.5 lpm plus bipap and sleeps fine  Breathing no better on saba/ nor is cough  - doe best days = MMRC4  = sob if tries to leave home or while getting dressed   rec Stop lisinopril and start ibesartan 300 mg daily - reduce to one half if too strong Plan A = Automatic = spiriva / breo each am  Plan B = Backup Only use your albuterol as a rescue medication Plan C = Crisis - only use your albuterol nebulizer if you first try Plan B   03/02/2016  f/u ov/Bryan Becker re: sob x 2012/ cough some better off acei  Chief Complaint  Patient presents with  . Follow-up    Breathing worse since the last visit "feels like I'm smothering".  He states he is already hoarse and has increased cough that is non prod.    confused with when/how to use Plan B and C even when reviewed written instructions from last ov  cough really main issue and  is more day than noct and remains dry/ hacking Doe = MMRC2 = can't walk a nl pace on a flat grade s sob but does fine slow and flat eg    No obvious day to day or daytime variability or assoc excess/ purulent sputum or mucus plugs or hemoptysis or cp or chest tightness, subjective wheeze or overt sinus or hb symptoms. No unusual exp hx or h/o childhood pna/ asthma or knowledge of premature  birth.  Sleeping ok without nocturnal  or early am exacerbation  of respiratory  c/o's or need for noct saba. Also denies any obvious fluctuation of symptoms with weather or environmental changes or other aggravating or alleviating factors except as outlined above   Current Medications, Allergies, Complete Past Medical History, Past Surgical History, Family History, and Social History were reviewed in [employer] record.  ROS  The following are not active complaints unless bolded sore throat, dysphagia, dental problems, itching, sneezing,  nasal congestion or excess/ purulent secretions, ear ache,   fever, chills, sweats, unintended wt loss, classically pleuritic or exertional cp,  orthopnea pnd or leg swelling, presyncope, palpitations, abdominal pain, anorexia, nausea, vomiting, diarrhea  or change in bowel or bladder habits, change in stools or urine, dysuria,hematuria,  rash, arthralgias, visual complaints, headache, numbness, weakness or ataxia or problems with walking or coordination,  change in mood/affect or memory.                  Objective:   Physical Exam   amb hoarse  wm nad       03/02/2016    287   01/04/16 284 lb 12.8 oz (129.2 kg)  08/08/15 265 lb (120.2 kg)  06/22/15 260 lb (117.9 kg)    Vital signs reviewed - note bp now ok/ sats 98% RA on arrival     HEENT: nl dentition, turbinates, and oropharynx. Nl external ear canals without cough reflex   NECK :  without JVD/Nodes/TM/ nl carotid upstrokes bilaterally   LUNGS: no acc muscle use,  Minimal barrel shaped chest/ distant bs s wheeze but slt long T exp  CV:  RRR  no s3 or murmur or increase in P2, no edema   ABD: tensely obese  soft and nontender with nl inspiratory excursion in the supine position. No bruits or organomegaly, bowel sounds nl  MS:  Nl gait/ ext warm without deformities, calf tenderness, cyanosis or clubbing No obvious joint restrictions   SKIN: warm and dry without  lesions    NEURO:  alert, approp, nl sensorium with  no motor deficits      CXR PA and Lateral:   03/02/2016 :    I personally reviewed images and agree with radiology impression as follows:    Cardiomediastinal silhouette is stable. No acute infiltrate or pleural effusion. No pulmonary edema. Degenerative changes thoracic Spine.  Labs ordered/ reviewed:    Chemistry      Component Value Date/Time   NA 140 03/02/2016 1500   K 3.5 03/02/2016 1500   CL 99 03/02/2016 1500   CO2 31 03/02/2016 1500   BUN 13 03/02/2016 1500   CREATININE 0.79 03/02/2016 1500      Component Value Date/Time   CALCIUM 9.7 03/02/2016 1500   ALKPHOS 37 (L) 11/21/2015 2058   AST 19 11/21/2015 2058   ALT 21 11/21/2015 2058   BILITOT 0.7 11/21/2015 2058        Lab Results  Component Value Date   WBC 5.7 03/02/2016   HGB 15.0 03/02/2016   HCT 44.8 03/02/2016   MCV 86.1 03/02/2016   PLT 207.0 03/02/2016         Lab Results  Component Value Date   TSH 1.18 03/02/2016     Lab Results  Component Value Date   PROBNP 11.0 03/02/2016       Lab Results  Component Value Date   ESRSEDRATE 25 (H) 11/20/2012          Assessment & Plan:

## 2016-03-02 NOTE — Patient Instructions (Addendum)
Stop  breo and spiriva     Plan A = Automatic =  Bevespi Take 2 puffs first thing in am and then another 2 puffs about 12 hours later.                                       Prilosec should be Take 30-60 min before first meal of the day    Work on inhaler technique:  relax and gently blow all the way out then take a nice smooth deep breath back in, triggering the inhaler at same time you start breathing in.  Hold for up to 5 seconds if you can. Blow out thru nose. Rinse and gargle with water when done      Plan B = Backup Only use your albuterol as a rescue medication to be used if you can't catch your breath by resting or doing a relaxed purse lip breathing pattern.  - The less you use it, the better it will work when you need it. - Ok to use the inhaler up to 2 puffs  every 4 hours if you must but call for appointment if use goes up over your usual need - Don't leave home without it !!  (think of it like the spare tire for your car)   Plan C = Crisis - only use your albuterol nebulizer if you first try Plan B and it fails to help > ok to use the nebulizer up to every 4 hours but if start needing it regularly call for immediate appointment   Please remember to go to the lab and x-ray department downstairs for your tests - we will call you with the results when they are available.     See Tammy NP  In 2 weeks with your care provider and  all your medications, even over the counter meds, separated in two separate bags, the ones you take no matter what vs the ones you stop once you feel better and take only as needed when you feel you need them.   Tammy  will generate for you a new user friendly medication calendar that will put [place] all on the same page re: your medication use.     Without this process, it simply isn't possible to assure that we are providing  your outpatient care  with  the attention to detail we feel you deserve.   If we cannot assure that you're getting that kind of care,   then we cannot manage your problem effectively from this clinic.  Once you have seen Tammy and we are sure that we're all on the same page with your medication use she will arrange follow up with me.

## 2016-03-03 NOTE — Progress Notes (Signed)
Spoke with pt and notified of results per Dr. Wert. Pt verbalized understanding and denied any questions. 

## 2016-03-03 NOTE — Assessment & Plan Note (Signed)
Trial off acei  01/04/2016 >>> due to dry daytime cough > improved 03/02/2016 with better bp control   Although even in retrospect it may not be clear the ACEi contributed to the pt's symptoms,  Pt improved off them and adding them back at this point or in the future would risk confusion in interpretation of non-specific respiratory symptoms to which this patient is prone  ie  Better not to muddy the waters here.

## 2016-03-03 NOTE — Progress Notes (Signed)
Spoke with Bryan Becker and notified of results per Dr. Wert. Bryan Becker verbalized understanding and denied any questions. 

## 2016-03-03 NOTE — Assessment & Plan Note (Addendum)
Trial off acei 01/04/2016 >>>  - PFT's  02/11/16  FEV1 1.72 (47 % ) ratio 55  p 14 % improvement from saba p spiriva prior to study with DLCO  62 % corrects to 117 % for alv volume  And ERV 26   - 03/02/2016  After extensive coaching HFA effectiveness =    75% > try bevespi Take 2 puffs first thing in am and then another 2 puffs about 12 hours later.   Pt is Group B in terms of symptom/risk and laba/lama therefore appropriate rx at this point and hoarseness is likely from DPI / ics should be better on hfa/ no ics but could aslo be gerd related > try gerd rx next   I had an extended discussion with the patient reviewing all relevant studies completed to date and  lasting 15 to 20 minutes of a 25 minute visit    Desperately needs med reconciliation.  To keep things simple, I have asked the patient to first separate medicines that are perceived as maintenance, that is to be taken daily "no matter what", from those medicines that are taken on only on an as-needed basis and I have given the patient examples of both, and then return to see our NP to generate a  detailed  medication calendar which should be followed until the next physician sees the patient and updates it.     Each maintenance medication was reviewed in detail including most importantly the difference between maintenance and prns and under what circumstances the prns are to be triggered using an action plan format that is not reflected in the computer generated alphabetically organized AVS.    Please see instructions for details which were reviewed in writing and the patient given a copy highlighting the part that I personally wrote and discussed at today's ov.

## 2016-03-06 NOTE — Assessment & Plan Note (Signed)
No evidence of chf/anemia/ thyroid dz/ will add max gerd rx pending return to regroup

## 2016-03-11 ENCOUNTER — Telehealth (HOSPITAL_COMMUNITY): Payer: Self-pay

## 2016-03-11 NOTE — Telephone Encounter (Signed)
Encounter complete. 

## 2016-03-16 ENCOUNTER — Ambulatory Visit (HOSPITAL_COMMUNITY)
Admission: RE | Admit: 2016-03-16 | Discharge: 2016-03-16 | Disposition: A | Discharge status: Home / Self Care | Payer: Medicare Other | Source: Ambulatory Visit | Attending: Cardiovascular Disease | Admitting: Cardiovascular Disease

## 2016-03-16 DIAGNOSIS — G4733 Obstructive sleep apnea (adult) (pediatric): Secondary | ICD-10-CM | POA: Diagnosis not present

## 2016-03-16 DIAGNOSIS — E669 Obesity, unspecified: Secondary | ICD-10-CM | POA: Insufficient documentation

## 2016-03-16 DIAGNOSIS — Z8673 Personal history of transient ischemic attack (TIA), and cerebral infarction without residual deficits: Secondary | ICD-10-CM | POA: Insufficient documentation

## 2016-03-16 DIAGNOSIS — Z8249 Family history of ischemic heart disease and other diseases of the circulatory system: Secondary | ICD-10-CM | POA: Diagnosis not present

## 2016-03-16 DIAGNOSIS — R0609 Other forms of dyspnea: Secondary | ICD-10-CM | POA: Diagnosis not present

## 2016-03-16 DIAGNOSIS — R072 Precordial pain: Secondary | ICD-10-CM | POA: Insufficient documentation

## 2016-03-16 DIAGNOSIS — I10 Essential (primary) hypertension: Secondary | ICD-10-CM | POA: Diagnosis not present

## 2016-03-16 DIAGNOSIS — Z6841 Body Mass Index (BMI) 40.0 and over, adult: Secondary | ICD-10-CM | POA: Diagnosis not present

## 2016-03-16 DIAGNOSIS — E119 Type 2 diabetes mellitus without complications: Secondary | ICD-10-CM | POA: Insufficient documentation

## 2016-03-16 MED ORDER — TECHNETIUM TC 99M TETROFOSMIN IV KIT
28.8000 | PACK | Freq: Once | INTRAVENOUS | Status: AC | PRN
  Administered 2016-03-16: 28.8 via INTRAVENOUS
  Filled 2016-03-16: qty 29

## 2016-03-16 MED ORDER — REGADENOSON 0.4 MG/5ML IV SOLN
0.4000 mg | Freq: Once | INTRAVENOUS | Status: AC
  Administered 2016-03-16: 0.4 mg via INTRAVENOUS

## 2016-03-17 ENCOUNTER — Ambulatory Visit (HOSPITAL_COMMUNITY)
Admission: RE | Admit: 2016-03-17 | Discharge: 2016-03-17 | Disposition: A | Discharge status: Home / Self Care | Payer: Medicare Other | Source: Ambulatory Visit | Attending: Cardiovascular Disease | Admitting: Cardiovascular Disease

## 2016-03-17 DIAGNOSIS — Z79899 Other long term (current) drug therapy: Secondary | ICD-10-CM | POA: Diagnosis not present

## 2016-03-17 DIAGNOSIS — R0602 Shortness of breath: Secondary | ICD-10-CM | POA: Diagnosis not present

## 2016-03-17 LAB — MYOCARDIAL PERFUSION IMAGING
CHL CUP RESTING HR STRESS: 96 {beats}/min
LVDIAVOL: 84 mL (ref 62–150)
LVSYSVOL: 36 mL
NUC STRESS TID: 1.02
Peak HR: 111 {beats}/min
SDS: 6
SRS: 2
SSS: 8

## 2016-03-17 MED ORDER — TECHNETIUM TC 99M TETROFOSMIN IV KIT
30.1000 | PACK | Freq: Once | INTRAVENOUS | Status: AC | PRN
  Administered 2016-03-17: 30.1 via INTRAVENOUS

## 2016-03-18 LAB — BASIC METABOLIC PANEL
BUN: 12 mg/dL (ref 7–25)
CALCIUM: 9 mg/dL (ref 8.6–10.3)
CO2: 31 mmol/L (ref 20–31)
CREATININE: 0.77 mg/dL (ref 0.70–1.33)
Chloride: 99 mmol/L (ref 98–110)
GLUCOSE: 244 mg/dL — AB (ref 65–99)
Potassium: 3.6 mmol/L (ref 3.5–5.3)
SODIUM: 139 mmol/L (ref 135–146)

## 2016-03-18 LAB — BRAIN NATRIURETIC PEPTIDE: BRAIN NATRIURETIC PEPTIDE: 5.4 pg/mL (ref <100)

## 2016-03-21 ENCOUNTER — Ambulatory Visit (HOSPITAL_COMMUNITY)
Admission: RE | Admit: 2016-03-21 | Discharge: 2016-03-21 | Disposition: A | Discharge status: Home / Self Care | Payer: Medicare Other | Source: Ambulatory Visit | Attending: Cardiology | Admitting: Cardiology

## 2016-03-21 DIAGNOSIS — Z87891 Personal history of nicotine dependence: Secondary | ICD-10-CM

## 2016-03-21 DIAGNOSIS — Z136 Encounter for screening for cardiovascular disorders: Secondary | ICD-10-CM

## 2016-03-21 DIAGNOSIS — I6523 Occlusion and stenosis of bilateral carotid arteries: Secondary | ICD-10-CM | POA: Insufficient documentation

## 2016-03-21 DIAGNOSIS — I779 Disorder of arteries and arterioles, unspecified: Secondary | ICD-10-CM

## 2016-03-21 DIAGNOSIS — I739 Peripheral vascular disease, unspecified: Principal | ICD-10-CM

## 2016-03-21 DIAGNOSIS — Z139 Encounter for screening, unspecified: Secondary | ICD-10-CM

## 2016-03-23 ENCOUNTER — Encounter: Payer: Self-pay | Admitting: Adult Health

## 2016-03-23 ENCOUNTER — Ambulatory Visit (INDEPENDENT_AMBULATORY_CARE_PROVIDER_SITE_OTHER): Payer: Medicare HMO | Admitting: Adult Health

## 2016-03-23 DIAGNOSIS — J449 Chronic obstructive pulmonary disease, unspecified: Secondary | ICD-10-CM | POA: Diagnosis not present

## 2016-03-23 MED ORDER — GLYCOPYRROLATE-FORMOTEROL 9-4.8 MCG/ACT IN AERO: 2.0000 | INHALATION_SPRAY | Freq: Two times a day (BID) | RESPIRATORY_TRACT | 4 refills | Status: DC

## 2016-03-23 NOTE — Assessment & Plan Note (Signed)
B/p elevated, did not take meds this am  Advised to take meds and follow up with PCP

## 2016-03-23 NOTE — Progress Notes (Signed)
Subjective:    Patient ID: Bryan Becker, male    DOB: [birth year]    MRN: [id number]    Brief patient profile:  18 yowm quit smoking 2004 with bad am cough which resolved with doe x 100 ft medications helped some but gradually worse to point where doe room to room so referred to pulmonary clinic 01/04/2016 by Dr York Ram with GOLD III copd criteria established 02/11/16     History of Present Illness  01/04/2016 1st  Pulmonary office visit/ Wert  spiriva in am/ breo in pm  Chief Complaint  Patient presents with  . Advice Only    Referred by Dr. Jimmye Norman for COPD.  Pt is switching care from a pulmonologist in [place].    cough is dry /daytime x years  has prn tank and 02 2.5 lpm plus bipap and sleeps fine  Breathing no better on saba/ nor is cough  - doe best days = MMRC4  = sob if tries to leave home or while getting dressed   rec Stop lisinopril and start ibesartan 300 mg daily - reduce to one half if too strong Plan A = Automatic = spiriva / breo each am  Plan B = Backup Only use your albuterol as a rescue medication Plan C = Crisis - only use your albuterol nebulizer if you first try Plan B   03/02/2016  f/u ov/Wert re: sob x 2012/ cough some better off acei  Chief Complaint  Patient presents with  . Follow-up    Breathing worse since the last visit "feels like I'm smothering".  He states he is already hoarse and has increased cough that is non prod.    confused with when/how to use Plan B and C even when reviewed written instructions from last ov  cough really main issue and  is more day than noct and remains dry/ hacking Doe = MMRC2 = can't walk a nl pace on a flat grade s sob but does fine slow and flat eg  >>change to bevespi    03/23/2016 Follow up : COPD  Patient returns for a one-month follow-up and medication review. Patient did not bring his medications in today . Last visit, patient was changed from [place].to [employer].  Feels he likes the  [place] better. Feels he can breath a little better.  Now he is able to sing at church  Cough has resolved off ACE now.  He denies chest pain , orthopnea, edema, fever or hemoptysis .  Does use albuterol most days Twice daily  -explained this is rescue med not controller.. Pt education given.       Current Medications, Allergies, Complete Past Medical History, Past Surgical History, Family History, and Social History were reviewed in [employer] record.  ROS  The following are not active complaints unless bolded sore throat, dysphagia, dental problems, itching, sneezing,  nasal congestion or excess/ purulent secretions, ear ache,   fever, chills, sweats, unintended wt loss, classically pleuritic or exertional cp,  orthopnea pnd or leg swelling, presyncope, palpitations, abdominal pain, anorexia, nausea, vomiting, diarrhea  or change in bowel or bladder habits, change in stools or urine, dysuria,hematuria,  rash, arthralgias, visual complaints, headache, numbness, weakness or ataxia or problems with walking or coordination,  change in mood/affect or memory.                  Objective:   Physical Exam   amb hoarse  wm nad  Vitals:   03/23/16 0923  Pulse: 86  SpO2: 98%  Weight: 291 lb (132 kg)  Height: 5' 9"  (1.753 m)     Vital signs reviewed - sats 98% RA on arrival     HEENT: nl dentition, turbinates, and oropharynx. Nl external ear canals without cough reflex   NECK :  without JVD/Nodes/TM/ nl carotid upstrokes bilaterally   LUNGS: no acc muscle use,  Minimal barrel shaped chest, distant BS in bases   CV:  RRR  no s3 or murmur or increase in P2, no edema   ABD: tensely obese  soft and nontender with nl inspiratory excursion in the supine position. No bruits or organomegaly, bowel sounds nl  MS:  Nl gait/ ext warm without deformities, calf tenderness, cyanosis or clubbing No obvious joint restrictions   SKIN: warm and dry without lesions     NEURO:  alert, approp, nl sensorium with  no motor deficits      CXR PA and Lateral:   03/02/2016 :     Cardiomediastinal silhouette is stable. No acute infiltrate or pleural effusion. No pulmonary edema. Degenerative changes thoracic Spine.  Labs ordered/ reviewed:    Chemistry      Component Value Date/Time   NA 140 03/02/2016 1500   K 3.5 03/02/2016 1500   CL 99 03/02/2016 1500   CO2 31 03/02/2016 1500   BUN 13 03/02/2016 1500   CREATININE 0.79 03/02/2016 1500      Component Value Date/Time   CALCIUM 9.7 03/02/2016 1500   ALKPHOS 37 (L) 11/21/2015 2058   AST 19 11/21/2015 2058   ALT 21 11/21/2015 2058   BILITOT 0.7 11/21/2015 2058        Lab Results  Component Value Date   WBC 5.7 03/02/2016   HGB 15.0 03/02/2016   HCT 44.8 03/02/2016   MCV 86.1 03/02/2016   PLT 207.0 03/02/2016         Lab Results  Component Value Date   TSH 1.18 03/02/2016     Lab Results  Component Value Date   PROBNP 11.0 03/02/2016       Lab Results  Component Value Date   ESRSEDRATE 25 (H) 11/20/2012     Keldon Lassen NP-C  [facility] 03/23/2016

## 2016-03-23 NOTE — Assessment & Plan Note (Signed)
Compensated w/ improved control on Bevespi  Refer to pulm rehab  Albuterol education   Plan  Patient Instructions  Refer to pulmonary rehab.  Continue on Bevespi Take 2 puffs first thing in am and then another 2 puffs about 12 hours later. , rinse after use.  Use albuterol inhaler As needed  Only -this is your rescue/emergency inhaler.  follow up Dr. Melvyn Novas  In 2 months and As needed

## 2016-03-23 NOTE — Patient Instructions (Addendum)
Refer to pulmonary rehab.  Continue on Bevespi Take 2 puffs first thing in am and then another 2 puffs about 12 hours later. , rinse after use.  Use albuterol inhaler As needed  Only -this is your rescue/emergency inhaler.  follow up Dr. Melvyn Novas  In 2 months and As needed

## 2016-03-23 NOTE — Progress Notes (Signed)
Chart and office note reviewed in detail  > agree with a/p as outlined    

## 2016-03-24 ENCOUNTER — Telehealth: Payer: Self-pay | Admitting: Internal Medicine

## 2016-03-24 ENCOUNTER — Other Ambulatory Visit (HOSPITAL_COMMUNITY): Payer: Medicare Other

## 2016-03-24 ENCOUNTER — Telehealth (HOSPITAL_COMMUNITY): Payer: Self-pay

## 2016-03-24 DIAGNOSIS — J449 Chronic obstructive pulmonary disease, unspecified: Secondary | ICD-10-CM

## 2016-03-24 NOTE — Telephone Encounter (Signed)
Called patient in regards to Pulmonary Rehab referral. Patient wants to come to [facility] even though he lives in [place]. He needs to discuss with the person who transports him. I called and requested new order from Dr. Melvyn Novas.

## 2016-03-24 NOTE — Telephone Encounter (Signed)
New order has been placed for the pt .  I called and lm with Thayer Headings to let [facility] know.

## 2016-03-31 DIAGNOSIS — N3020 Other chronic cystitis without hematuria: Secondary | ICD-10-CM | POA: Diagnosis not present

## 2016-03-31 DIAGNOSIS — N3281 Overactive bladder: Secondary | ICD-10-CM | POA: Diagnosis not present

## 2016-03-31 DIAGNOSIS — N401 Enlarged prostate with lower urinary tract symptoms: Secondary | ICD-10-CM | POA: Diagnosis not present

## 2016-03-31 DIAGNOSIS — N318 Other neuromuscular dysfunction of bladder: Secondary | ICD-10-CM | POA: Diagnosis not present

## 2016-04-13 ENCOUNTER — Ambulatory Visit (HOSPITAL_COMMUNITY): Payer: Medicare HMO | Attending: Cardiology

## 2016-04-13 ENCOUNTER — Other Ambulatory Visit: Payer: Self-pay

## 2016-04-13 DIAGNOSIS — R079 Chest pain, unspecified: Secondary | ICD-10-CM | POA: Diagnosis not present

## 2016-04-13 DIAGNOSIS — Z6841 Body Mass Index (BMI) 40.0 and over, adult: Secondary | ICD-10-CM | POA: Diagnosis not present

## 2016-04-13 DIAGNOSIS — E119 Type 2 diabetes mellitus without complications: Secondary | ICD-10-CM | POA: Insufficient documentation

## 2016-04-13 DIAGNOSIS — R0602 Shortness of breath: Secondary | ICD-10-CM | POA: Insufficient documentation

## 2016-04-13 DIAGNOSIS — Z87891 Personal history of nicotine dependence: Secondary | ICD-10-CM | POA: Insufficient documentation

## 2016-04-13 DIAGNOSIS — E785 Hyperlipidemia, unspecified: Secondary | ICD-10-CM | POA: Diagnosis not present

## 2016-04-13 DIAGNOSIS — J449 Chronic obstructive pulmonary disease, unspecified: Secondary | ICD-10-CM | POA: Diagnosis not present

## 2016-04-19 ENCOUNTER — Other Ambulatory Visit: Payer: Self-pay

## 2016-04-19 MED ORDER — FUROSEMIDE 40 MG PO TABS: ORAL_TABLET | ORAL | 1 refills | Status: DC

## 2016-04-21 ENCOUNTER — Ambulatory Visit (INDEPENDENT_AMBULATORY_CARE_PROVIDER_SITE_OTHER): Payer: Medicare HMO | Admitting: Cardiovascular Disease

## 2016-04-21 ENCOUNTER — Encounter: Payer: Self-pay | Admitting: Cardiovascular Disease

## 2016-04-21 VITALS — BP 150/80 | HR 96 | Ht 69.0 in | Wt 286.0 lb

## 2016-04-21 DIAGNOSIS — R0609 Other forms of dyspnea: Secondary | ICD-10-CM

## 2016-04-21 DIAGNOSIS — I10 Essential (primary) hypertension: Secondary | ICD-10-CM | POA: Diagnosis not present

## 2016-04-21 DIAGNOSIS — Z8249 Family history of ischemic heart disease and other diseases of the circulatory system: Secondary | ICD-10-CM

## 2016-04-21 DIAGNOSIS — R0789 Other chest pain: Secondary | ICD-10-CM | POA: Diagnosis not present

## 2016-04-21 DIAGNOSIS — J449 Chronic obstructive pulmonary disease, unspecified: Secondary | ICD-10-CM

## 2016-04-21 DIAGNOSIS — Z8673 Personal history of transient ischemic attack (TIA), and cerebral infarction without residual deficits: Secondary | ICD-10-CM

## 2016-04-21 MED ORDER — BISOPROLOL FUMARATE 5 MG PO TABS: 5.0000 mg | ORAL_TABLET | Freq: Every day | ORAL | 3 refills | Status: DC

## 2016-04-21 NOTE — Patient Instructions (Signed)
Dr Sallyanne Kuster has recommended making the following medication changes: 1. START Bisoprolol 5 mg - take 1 tablet by mouth daily  Your physician has requested that you regularly monitor and record your blood pressure readings at home. Please use the same machine at the same time of day to check your readings and record them to bring to your follow-up. IN 1-2 WEEKS please send [place] your blood pressure readings either via mychart or by calling the office to speak with a nurse.  Dr Sallyanne Kuster recommends that you schedule a follow-up appointment in 6 months. You will receive a reminder letter in the mail two months in advance. If you don't receive a letter, please call our office to schedule the follow-up appointment.  If you need a refill on your cardiac medications before your next appointment, please call your pharmacy.

## 2016-04-21 NOTE — Progress Notes (Signed)
Cardiology Consultation Note    Date:  04/21/2016   ID:  Bryan Becker, DOB 10-Feb-1961, MRN [id number]  PCP:  Harvie Junior, MD  Cardiologist:   Sanda Klein, MD   No chief complaint on file.   History of Present Illness:  Bryan Becker is a 55 y.o. male with morbid obesity, previous stroke, insulin requiring type II diabetes mellitus, hypertriglyceridemia, COPD, obstructive sleep apnea by recentsleep study who presents In follow-up after undergoing a treadmill stress test, carotid Dopplers, AAA screening and echocardiogram.  History of stress test was normal. He was able to excise for 6 minutes without chest pain or EKG changes. His echo showed normal left ventricular systolic function and wall motion. There was left ventricular hypertrophy and early signs of diastolic dysfunction as well as a borderline dilated ascending aorta at 38 mm diameter. His carotids only show mild plaque. His abdominal aorta measured 2.7 cm without evidence for aneurysm.  He has lost 5 pounds in weight since his last appointment. Bryan Becker and his wife have joined [employer]. He is trying to curtail unhealthy foods from his diet.  He has recently been diagnosed with obstructive sleep apnea but has not started CPAP. PFTs confirm the presence of moderate COPD (FEV1 41%).Marland Kitchen He quit smoking about 15 years ago. Two family members have had abdominal aortic aneurysms, including his father.    Past Medical History:  Diagnosis Date  . Anxiety   . Arthritis    "joints"  . Asthma   . CHF (congestive heart failure) ([employer])   . Chronic back pain    "mostly lower; have 2 bulging discs in my neck" (12/05/2013)  . Chronic bronchitis ([place])    "get it q yr"  . 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 Confusion   . COPD (chronic obstructive pulmonary disease) ([place])   . Dementia    onset of early  . Dementia   . Depression   . Fall at home 06/04/14   Pt stated he falls off the bed at  nightly  . Family history of adverse reaction to anesthesia    Patients mother had a bad reaction to Anesthesia however pt is unaware of reaction  . Fibromyalgia   . Frequent urination   . Gastric ulcer   . GERD (gastroesophageal reflux disease)   . NIOEVOJJ(009.3)    "weekly" (12/04/2013)  . Hyperlipidemia   . Hypertension   . Kidney stone   . Myocardial infarction    unsure about MI history, but had normal cath that admission '07 ([facility])  . Nausea & vomiting   . OSA (obstructive sleep apnea) 04/24/2013   "use BiPAP"  . Pancreatitis   . Pneumonia    "several times"  . RMSF [facility] spotted fever) 11/22/2012  . Seasonal allergies   . Shortness of breath dyspnea    with ambulation  . Stroke [facility]) 03/2013   "still weak on the left side" (12/05/2013)  . Stroke [facility]) 12/04/2013   "came in today w/right sided weakness" (12/04/2013)  . Type II diabetes mellitus ([place])     Past Surgical History:  Procedure Laterality Date  . CARDIAC CATHETERIZATION     10/28/05: right dominant, normal LV systolic function, normal coronaries. (Dr. Baxter Hire, [facility])  . CHOLECYSTECTOMY    . COLONOSCOPY    . CYSTOSCOPY W/ STONE MANIPULATION    . LUMBAR LAMINECTOMY/DECOMPRESSION MICRODISCECTOMY Left 06/18/2014   Procedure: LUMBAR LAMINECTOMY/DECOMPRESSION MICRODISCECTOMY LEFT LUMBAR FIVE-SACRAL ONE;  Surgeon: Marylyn Ishihara  Christella Noa, MD;  Location: [place] [facility];  Service: Neurosurgery;  Laterality: Left;  left  . multiple cysts removal-hip,wrist    . TONSILLECTOMY  ~ 1985  . UPPER GASTROINTESTINAL ENDOSCOPY      Current Medications: Outpatient Medications Prior to Visit  Medication Sig Dispense Refill  . albuterol (ACCUNEB) 1.25 MG/3ML nebulizer solution Take 1 ampule by nebulization every 6 (six) hours as needed for wheezing.    Marland Kitchen albuterol (PROVENTIL HFA;VENTOLIN HFA) 108 (90 BASE) MCG/ACT inhaler Inhale 2 puffs into the lungs every 6 (six) hours as needed. For shortness of breath     . canagliflozin (INVOKANA)  100 MG TABS tablet Take 100 mg by mouth.    . citalopram (CELEXA) 40 MG tablet Take 40 mg by mouth daily.      . clopidogrel (PLAVIX) 75 MG tablet Take 75 mg by mouth daily.    . cyclobenzaprine (FLEXERIL) 10 MG tablet Take 1 tablet (10 mg total) by mouth 3 (three) times daily as needed for muscle spasms. 60 tablet 0  . docusate sodium (COLACE) 100 MG capsule Take 100 mg by mouth daily as needed for mild constipation.    . fesoterodine (TOVIAZ) 4 MG TB24 tablet Take 4 mg by mouth daily.    . fexofenadine (ALLEGRA) 180 MG tablet Take 180 mg by mouth daily.    . finasteride (PROSCAR) 5 MG tablet Take 5 mg by mouth daily.    . fluticasone (FLONASE) 50 MCG/ACT nasal spray Place 2 sprays into both nostrils daily as needed for allergies.     . furosemide (LASIX) 40 MG tablet Take 1-2 tablets (40-80 mg total) by mouth twice daily as directed. 90 tablet 1  . gabapentin (NEURONTIN) 600 MG tablet Take 1,200 mg by mouth 3 (three) times daily.    Marland Kitchen gemfibrozil (LOPID) 600 MG tablet Take 600 mg by mouth daily.    Marland Kitchen glimepiride (AMARYL) 4 MG tablet Take 4 mg by mouth daily with breakfast.    . Glycopyrrolate-Formoterol (BEVESPI AEROSPHERE) 9-4.8 MCG/ACT AERO Inhale 2 puffs into the lungs 2 (two) times daily. 1 Inhaler 4  . HYDROcodone-acetaminophen (NORCO) 10-325 MG tablet Take 1 tablet by mouth daily.    . insulin detemir (LEVEMIR) 100 UNIT/ML injection Inject 60 Units into the skin at bedtime.     . insulin lispro protamine-lispro (HUMALOG 50/50 MIX) (50-50) 100 UNIT/ML SUSP injection Inject 1-10 Units into the skin 2 (two) times daily before a meal.     . irbesartan (AVAPRO) 300 MG tablet Take 1 tablet (300 mg total) by mouth daily. 30 tablet 5  . L-Lysine 1000 MG TABS Take 1,000 mg by mouth daily as needed (for fever blisters).    . Liraglutide 18 MG/3ML SOPN Inject 1.8 mg into the skin daily.    Marland Kitchen LORazepam (ATIVAN) 1 MG tablet Take 1 mg by mouth every 8 (eight) hours as needed for anxiety.     .  memantine (NAMENDA) 5 MG tablet Take 5 mg by mouth daily.    Marland Kitchen omeprazole (PRILOSEC) 20 MG capsule Take 20 mg by mouth daily before breakfast.    . ondansetron (ZOFRAN) 4 MG tablet Take 1 tablet (4 mg total) by mouth every 8 (eight) hours as needed for nausea or vomiting. 20 tablet 0  . OXYGEN 2lpm with sleep and occ as needed during the day [employer]    . Potassium Gluconate 595 MG CAPS Take 2 capsules by mouth daily.    . prochlorperazine (COMPAZINE) 25 MG suppository Place 1  suppository (25 mg total) rectally every 12 (twelve) hours as needed for nausea or vomiting. 12 suppository 0  . promethazine (PHENERGAN) 25 MG tablet Take 1 tablet (25 mg total) by mouth every 6 (six) hours as needed for nausea or vomiting. 15 tablet 0  . Specialty Vitamins Products (MAGNESIUM, AMINO ACID CHELATE,) 133 MG tablet Take 1 tablet by mouth 2 (two) times daily.    . sucralfate (CARAFATE) 1 GM/10ML suspension Take 10 mLs (1 g total) by mouth 4 (four) times daily -  with meals and at bedtime. 420 mL 0  . traMADol (ULTRAM) 50 MG tablet Take 50 mg by mouth every 8 (eight) hours as needed for moderate pain.      No facility-administered medications prior to visit.      Allergies:   Actos [pioglitazone]; Aricept [donepezil hcl]; and Codeine   Social History   Social History  . Marital status: Married    Spouse name: N/A  . Number of children: 2  . Years of education: N/A   Occupational History  . unemployed [place] History Main Topics  . Smoking status: Former Smoker    Packs/day: 2.00    Years: 21.00    Types: Cigarettes    Quit date: 05/23/2002  . Smokeless tobacco: Never Used  . Alcohol use No  . Drug use: No  . Sexual activity: Not Currently   Other Topics Concern  . None   Social History Narrative  . None     Family History:  The patient's family history includes Aneurysm in his father, maternal uncle, and maternal uncle; Arthritis in his mother; Bone cancer in his  paternal grandmother; Diabetes in his maternal grandmother and mother; Heart attack in his father and paternal grandmother; Heart disease in his maternal grandmother and paternal grandfather; Hypertension in his father and mother; Lung cancer in his paternal grandfather; Other in his mother; Skin cancer in his father; Stomach cancer in his father and maternal grandfather.   ROS:   Please see the history of present illness.    ROS All other systems reviewed and are negative.   PHYSICAL EXAM:   VS:  BP (!) 150/80 (BP Location: Left Arm, Patient Position: Sitting, Cuff Size: Normal)   Pulse 96   Ht 5' 9"  (1.753 m)   Wt 286 lb (129.7 kg)   SpO2 98%   BMI 42.23 kg/m    GEN: Well nourished, well developed, in no acute distress . Morbid obesity limits the exam HEENT: normal  Neck: no JVD, carotid bruits, or masses Cardiac: RRR; no murmurs, rubs, or gallops,no edema  Respiratory:  clear to auscultation bilaterally, normal work of breathing GI: soft, nontender, nondistended, + BS MS: no deformity or atrophy  Skin: warm and dry, no rash Neuro:  Alert and Oriented x 3, Strength and sensation are intact Psych: euthymic mood, full affect  Wt Readings from Last 3 Encounters:  04/21/16 286 lb (129.7 kg)  03/23/16 291 lb (132 kg)  03/16/16 290 lb (131.5 kg)      Studies/Labs Reviewed:   EKG:  EKG is ordered today.  The ekg ordered today demonstrates Borderline resting tachycardia (96 bpm), sinus rhythm, ST segment depression and T-wave inversion only in lead 3, QTC 447 ms.  Recent Labs: 11/21/2015: ALT 21 03/02/2016: Hemoglobin 15.0; Platelets 207.0; Pro B Natriuretic peptide (BNP) 11.0; TSH 1.18 03/17/2016: Brain Natriuretic Peptide 5.4; BUN 12; Creat 0.77; Potassium 3.6; Sodium 139   Lipid Panel 11/16/2015 total cholesterol  225, triglycerides 734, HDL 21, hemoglobin A1c 10.0, normal LFTs  ASSESSMENT:    1. Other chest pain   2. Dyspnea on exertion   3. Essential hypertension   4.  History of ischemic stroke without residual deficits   5. Morbid (severe) obesity due to excess calories ([place])   6. COPD GOLD  III   7. Family history of abdominal aortic aneurysm (AAA)      PLAN:  In order of problems listed above:  1. Chest pain: Symptoms are both exertional and at rest. Currently not bothering him much. Normal treadmill stress test. 2. Dyspnea: This is multifactorial due to obesity, untreated sleep apnea, COPD, but also may be a component of diastolic heart failure. At the time of his echocardiogram there were no overt signs of elevated mean left atrial pressure. On furosemide and potassium supplement. 3. HTN: Added a highly selective beta 1 blocker. He is already on max dose of irbesartan and would like to avoid additional diuretic (due to treatment with invokana and furosemide) or calcium channel blocker (due to leg edema problems). 4. History of stroke: Currently asymptomatic. Carotid US shows minimal plaque. 5. HLP: On gemfibrozil. Improved glycemic control will also improve triglycerides. Reevaluate need for statin once his triglycerides are less than 300 or so. 6. Obesity: Should consider bariatric surgery. He definitely has multiple serious complications of obesity. 7. OSA: started CPAP 8. COPD: Seeing Dr. Melvyn Novas. Breathing did improve after discontinuation of ACE inhibitor. Will add a highly selective beta blocker. Warned about potential for wheezing and asked him to call if this occurs. Quit smoking years ago.  9. Family history AAA: None seen on screening ultrasound    Medication Adjustments/Labs and Tests Ordered: Current medicines are reviewed at length with the patient today.  Concerns regarding medicines are outlined above.  Medication changes, Labs and Tests ordered today are listed in the Patient Instructions below. Patient Instructions  Dr Sallyanne Kuster has recommended making the following medication changes: 1. START Bisoprolol 5 mg - take 1 tablet by mouth  daily  Your physician has requested that you regularly monitor and record your blood pressure readings at home. Please use the same machine at the same time of day to check your readings and record them to bring to your follow-up. IN 1-2 WEEKS please send [place] your blood pressure readings either via mychart or by calling the office to speak with a nurse.  Dr Sallyanne Kuster recommends that you schedule a follow-up appointment in 6 months. You will receive a reminder letter in the mail two months in advance. If you don't receive a letter, please call our office to schedule the follow-up appointment.  If you need a refill on your cardiac medications before your next appointment, please call your pharmacy.    Signed, Sanda Klein, MD  04/21/2016 2:26 PM    [address], [place], [place]  [address] Phone: [phone]; Fax: [phone]

## 2016-04-22 ENCOUNTER — Other Ambulatory Visit: Payer: Self-pay | Admitting: Internal Medicine

## 2016-04-22 MED ORDER — IRBESARTAN 300 MG PO TABS: 300.0000 mg | ORAL_TABLET | Freq: Every day | ORAL | 1 refills | Status: DC

## 2016-05-02 ENCOUNTER — Telehealth: Payer: Self-pay | Admitting: Internal Medicine

## 2016-05-02 NOTE — Telephone Encounter (Signed)
Can try delsym  2 tsp q 12 h prn or mucinex dm 1200 mg bid but if not better get him in this week as add on for me or NP

## 2016-05-02 NOTE — Telephone Encounter (Signed)
Spoke with the pt and notified of recs per MW  He verbalized understanding  Nothing further needed

## 2016-05-02 NOTE — Telephone Encounter (Signed)
Pt c/o cough and congestion x 1 days.  Little mucus production, not enough to spit out.  Pt states that his chest hurts when he coughs. Pt not using anything OTC for symptoms.   [facility]  Please advise Dr Melvyn Novas. Thanks.

## 2016-05-04 ENCOUNTER — Telehealth: Payer: Self-pay | Admitting: Internal Medicine

## 2016-05-04 NOTE — Telephone Encounter (Signed)
LMOMTCB x 1 

## 2016-05-06 NOTE — Telephone Encounter (Signed)
Attempted to call [name] back at [facility] program---they are closed on Friday.  Will need to contact back on Monday.

## 2016-05-09 NOTE — Telephone Encounter (Signed)
Attempted to contact [facility]. No answer and I was not given an option to leave a message. Will try back in the AM.

## 2016-05-10 NOTE — Telephone Encounter (Signed)
Bryan Becker [phone]) returned phone call.[name]

## 2016-05-10 NOTE — Telephone Encounter (Signed)
lmomtcb x1 

## 2016-05-10 NOTE — Telephone Encounter (Signed)
Return call from [place]

## 2016-05-10 NOTE — Telephone Encounter (Signed)
Called and spoke with [name] at [facility] and she is aware to use the code J44.9.  She will add this to the original order. Nothing further is needed.

## 2016-05-11 LAB — HEMOCCULT SLIDES (X 3 CARDS)

## 2016-05-20 ENCOUNTER — Telehealth: Payer: Self-pay | Admitting: Internal Medicine

## 2016-05-20 ENCOUNTER — Telehealth: Payer: Self-pay

## 2016-05-20 NOTE — Telephone Encounter (Signed)
PA started for [facility]. [facility] and now awaiting response. Ref# [id number].

## 2016-05-20 NOTE — Telephone Encounter (Signed)
PA came over for [facility], [phone]. The number I called on the back of his card stated there was no PA but I called the pharmacist and he gave me another number to call.

## 2016-05-24 ENCOUNTER — Telehealth: Payer: Self-pay | Admitting: Emergency Medicine

## 2016-05-24 ENCOUNTER — Other Ambulatory Visit: Payer: Self-pay | Admitting: Cardiovascular Disease

## 2016-05-24 ENCOUNTER — Encounter: Payer: Self-pay | Admitting: Gastroenterology

## 2016-05-24 ENCOUNTER — Ambulatory Visit (INDEPENDENT_AMBULATORY_CARE_PROVIDER_SITE_OTHER): Payer: Medicare HMO | Admitting: Gastroenterology

## 2016-05-24 VITALS — BP 140/80 | HR 91 | Ht 69.0 in | Wt 289.0 lb

## 2016-05-24 DIAGNOSIS — R195 Other fecal abnormalities: Secondary | ICD-10-CM | POA: Diagnosis not present

## 2016-05-24 DIAGNOSIS — E1310 Other specified diabetes mellitus with ketoacidosis without coma: Secondary | ICD-10-CM | POA: Diagnosis not present

## 2016-05-24 DIAGNOSIS — R1310 Dysphagia, unspecified: Secondary | ICD-10-CM | POA: Insufficient documentation

## 2016-05-24 DIAGNOSIS — R140 Abdominal distension (gaseous): Secondary | ICD-10-CM

## 2016-05-24 DIAGNOSIS — K625 Hemorrhage of anus and rectum: Secondary | ICD-10-CM

## 2016-05-24 MED ORDER — RIFAXIMIN 550 MG PO TABS: 550.0000 mg | ORAL_TABLET | Freq: Three times a day (TID) | ORAL | 0 refills | Status: DC

## 2016-05-24 MED ORDER — UMECLIDINIUM-VILANTEROL 62.5-25 MCG/INH IN AEPB: 1.0000 | INHALATION_SPRAY | Freq: Every day | RESPIRATORY_TRACT | 3 refills | Status: DC

## 2016-05-24 MED ORDER — RIFAXIMIN 550 MG PO TABS: 550.0000 mg | ORAL_TABLET | Freq: Two times a day (BID) | ORAL | 0 refills | Status: DC

## 2016-05-24 NOTE — Patient Instructions (Addendum)
You have been scheduled for a colonoscopy. Please follow written instructions given to you at your visit today.  Please pick up your prep supplies at the pharmacy within the next 1-3 days. If you use inhalers (even only as needed), please bring them with you on the day of your procedure. Your physician has requested that you go to www.startemmi.com and enter the access code given to you at your visit today. This web site gives a general overview about your procedure. However, you should still follow specific instructions given to you by our office regarding your preparation for the procedure.  You have been scheduled for a Barium Esophogram at [facility] (1st floor of the hospital) on 05/31/16 at 9:30 am. Please arrive 15 minutes prior to your appointment for registration. Make certain not to have anything to eat or drink 6 hours prior to your test. If you need to reschedule for any reason, please contact radiology at [phone] to do so. __________________________________________________________________ A barium swallow is an examination that concentrates on views of the esophagus. This tends to be a double contrast exam (barium and two liquids which, when combined, create a gas to distend the wall of the oesophagus) or single contrast (non-ionic iodine based). The study is usually tailored to your symptoms so a good history is essential. Attention is paid during the study to the form, structure and configuration of the esophagus, looking for functional disorders (such as aspiration, dysphagia, achalasia, motility and reflux) EXAMINATION You may be asked to change into a gown, depending on the type of swallow being performed. A radiologist and radiographer will perform the procedure. The radiologist will advise you of the type of contrast selected for your procedure and direct you during the exam. You will be asked to stand, sit or lie in several different positions and to hold a small amount  of fluid in your mouth before being asked to swallow while the imaging is performed .In some instances you may be asked to swallow barium coated marshmallows to assess the motility of a solid food bolus. The exam can be recorded as a digital or video fluoroscopy procedure. POST PROCEDURE It will take 1-2 days for the barium to pass through your system. To facilitate this, it is important, unless otherwise directed, to increase your fluids for the next 24-48hrs and to resume your normal diet.  This test typically takes about 30 minutes to perform. __________________________________________________________________________________  We have sent the following medications to your pharmacy for you to pick up at your convenience: Xifaxan 550 mg three times a day x 2 weeks.   You will be contaced by our office prior to your procedure for directions on holding your Plavix.  If you do not hear from our office 1 week prior to your scheduled procedure, please call [phone] to discuss.

## 2016-05-24 NOTE — Telephone Encounter (Signed)
Ok - anoro one click each am, take two good drags off each click

## 2016-05-24 NOTE — Telephone Encounter (Signed)
  05/24/2016   RE: Bryan Becker DOB: 10-16-1960 MRN: [id number]   Dear Dani Gobble Croitoru,    We have scheduled the above patient for an endoscopic procedure. Our records show that he is on anticoagulation therapy.   Please advise as to how long the patient may come off his therapy of Plavix prior to the procedure, which is scheduled for 07-08-16.  Please fax back/ or route the completed form to Tinnie Gens, [place].   Sincerely,   Tinnie Gens, [place]

## 2016-05-24 NOTE — Telephone Encounter (Signed)
Called and spoke with pt and he is aware of MW recs. anoro has been sent in to the pharmacy and pt is aware. Nothing further is needed.

## 2016-05-24 NOTE — Telephone Encounter (Signed)
Pt returning call a/b an rx. Please advise.Hillery Hunter

## 2016-05-24 NOTE — Telephone Encounter (Signed)
lmtcb X1 for pt to relay recs.

## 2016-05-24 NOTE — Telephone Encounter (Signed)
Called [employer] to check on status of PA. [employer] denied the medication based on the fact that the medication is not on the formulary list. When asked for other options, they stated that Anoro would be covered.   Dr.Wert, do you think Anoro will work for the patient? Please advise, thanks!

## 2016-05-24 NOTE — Progress Notes (Signed)
05/24/2016 Bryan Becker [id number] [birth year]   HISTORY OF PRESENT ILLNESS:  This is a 33 or old male who is known to [name]. He presents to our office today primarily for Hemoccult-positive stools that were performed by his PCP. He was Hemoccult positive on all 3 Hemoccult cards that were performed in December. 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 Biopsies of the area showed focal active colitis. Other biopsies of the terminal ileum were normal. This was thought to possibly be due to NSAIDs, but the patient denies that he was using NSAIDs at that time. He reports that on occasion he does sometimes see some bright red blood when he wipes. Denies seeing any black stools.  While he is here he also reports several  including low back pain, bloating and excessive gas, foul-smelling stools, and loose stools.  All of these issues are ongoing for several years.  His blood sugars are very poorly controlled. The other day he had a blood sugar of 700. He says that they usually run between 200 and 300 all the time.  Still has not seen an endocrinologist.  He is on Plavix for past history of CVA.  Is now on oxygen at nighttime.    As we were ending his visit he asked what kind of physician he would see for problems swallowing. He reports that over the past month he's had a couple of episodes of difficulty swallowing. Says that it usually occurs when he is eating cornbread. Does not occur every time he eats and is not occurring on a daily basis.  Does not choke.  Food passes easily with drinking liquids.  Does not recall ever having any issues with swallowing in the past.   Past Medical History:  Diagnosis Date  . Anxiety   . Arthritis    "joints"  . Asthma   . CHF (congestive heart failure) ([place])   . Chronic back pain    "mostly lower; have 2 bulging discs in my neck" (12/05/2013)  . Chronic  bronchitis ([place])    "get it q yr"  . 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 Confusion   . COPD (chronic obstructive pulmonary disease) ([place])   . Dementia    onset of early  . Dementia   . Depression   . Fall at home 06/04/14   Pt stated he falls off the bed at nightly  . Family history of adverse reaction to anesthesia    Patients mother had a bad reaction to Anesthesia however pt is unaware of reaction  . Fibromyalgia   . Frequent urination   . Gastric ulcer   . GERD (gastroesophageal reflux disease)   . YIFOYDXA(128.7)    "weekly" (12/04/2013)  . Hyperlipidemia   . Hypertension   . Kidney stone   . Myocardial infarction    unsure about MI history, but had normal cath that admission '07 ([facility])  . Nausea & vomiting   . OSA (obstructive sleep apnea) 04/24/2013   "use BiPAP"  . Pancreatitis   . Pneumonia    "several times"  . RMSF [facility] spotted fever) 11/22/2012  . Seasonal allergies   . Shortness of breath dyspnea    with ambulation  . Stroke [facility]) 03/2013   "still weak on the left side" (12/05/2013)  . Stroke [facility]) 12/04/2013   "came in  today w/right sided weakness" (12/04/2013)  . Type II diabetes mellitus ([place])    Past Surgical History:  Procedure Laterality Date  . CARDIAC CATHETERIZATION     10/28/05: right dominant, normal LV systolic function, normal coronaries. (Dr. Baxter Hire, [facility])  . CHOLECYSTECTOMY    . COLONOSCOPY    . CYSTOSCOPY W/ STONE MANIPULATION    . LUMBAR LAMINECTOMY/DECOMPRESSION MICRODISCECTOMY Left 06/18/2014   Procedure: LUMBAR LAMINECTOMY/DECOMPRESSION MICRODISCECTOMY LEFT LUMBAR FIVE-SACRAL ONE;  Surgeon: Ashok Pall, MD;  Location: [facility];  Service: Neurosurgery;  Laterality: Left;  left  . multiple cysts removal-hip,wrist    . TONSILLECTOMY  ~ 1985  . UPPER GASTROINTESTINAL ENDOSCOPY      reports that he quit smoking about 14 years ago. His smoking use included Cigarettes. He has a 42.00 pack-year smoking  history. He has never used smokeless tobacco. He reports that he does not drink alcohol or use drugs. family history includes Aneurysm in his father, maternal uncle, and maternal uncle; Arthritis in his mother; Bone cancer in his paternal grandmother; Diabetes in his maternal grandmother and mother; Heart attack in his father and paternal grandmother; Heart disease in his maternal grandmother and paternal grandfather; Hypertension in his father and mother; Lung cancer in his paternal grandfather; Other in his mother; Skin cancer in his father; Stomach cancer in his father and maternal grandfather. Allergies  Allergen Reactions  . Actos [Pioglitazone] Shortness Of Breath    Caused patient to be in CHF  . Aricept [Donepezil Hcl] Diarrhea  . Codeine Other (See Comments)    Made skin peel      Outpatient Encounter Prescriptions as of 05/24/2016  Medication Sig  . albuterol (ACCUNEB) 1.25 MG/3ML nebulizer solution Take 1 ampule by nebulization every 6 (six) hours as needed for wheezing.  Marland Kitchen albuterol (PROVENTIL HFA;VENTOLIN HFA) 108 (90 BASE) MCG/ACT inhaler Inhale 2 puffs into the lungs every 6 (six) hours as needed. For shortness of breath   . bisoprolol (ZEBETA) 5 MG tablet Take 1 tablet (5 mg total) by mouth daily.  . canagliflozin (INVOKANA) 100 MG TABS tablet Take 100 mg by mouth.  . citalopram (CELEXA) 40 MG tablet Take 40 mg by mouth daily.    . clopidogrel (PLAVIX) 75 MG tablet Take 75 mg by mouth daily.  . cyclobenzaprine (FLEXERIL) 10 MG tablet Take 1 tablet (10 mg total) by mouth 3 (three) times daily as needed for muscle spasms.  Marland Kitchen docusate sodium (COLACE) 100 MG capsule Take 100 mg by mouth daily as needed for mild constipation.  . fesoterodine (TOVIAZ) 4 MG TB24 tablet Take 4 mg by mouth daily.  . fexofenadine (ALLEGRA) 180 MG tablet Take 180 mg by mouth daily.  . finasteride (PROSCAR) 5 MG tablet Take 5 mg by mouth daily.  . fluticasone (FLONASE) 50 MCG/ACT nasal spray Place 2 sprays  into both nostrils daily as needed for allergies.   . furosemide (LASIX) 40 MG tablet Take 1-2 tablets (40-80 mg total) by mouth twice daily as directed.  . gabapentin (NEURONTIN) 600 MG tablet Take 1,200 mg by mouth 3 (three) times daily.  Marland Kitchen gemfibrozil (LOPID) 600 MG tablet Take 600 mg by mouth daily.  Marland Kitchen glimepiride (AMARYL) 4 MG tablet Take 4 mg by mouth daily with breakfast.  . Glycopyrrolate-Formoterol (BEVESPI AEROSPHERE) 9-4.8 MCG/ACT AERO Inhale 2 puffs into the lungs 2 (two) times daily.  Marland Kitchen HYDROcodone-acetaminophen (NORCO) 10-325 MG tablet Take 1 tablet by mouth daily.  . Insulin Aspart (NOVOLOG FLEXPEN [place]) Inject into the skin  4 (four) times daily.  . insulin detemir (LEVEMIR) 100 UNIT/ML injection Inject 60 Units into the skin at bedtime.   . irbesartan (AVAPRO) 300 MG tablet Take 1 tablet (300 mg total) by mouth daily.  Marland Kitchen L-Lysine 1000 MG TABS Take 1,000 mg by mouth daily as needed (for fever blisters).  . Liraglutide 18 MG/3ML SOPN Inject 1.8 mg into the skin daily.  Marland Kitchen LORazepam (ATIVAN) 1 MG tablet Take 1 mg by mouth every 8 (eight) hours as needed for anxiety.   . memantine (NAMENDA) 5 MG tablet Take 5 mg by mouth daily.  Marland Kitchen omeprazole (PRILOSEC) 20 MG capsule Take 20 mg by mouth daily before breakfast.  . ondansetron (ZOFRAN) 4 MG tablet Take 1 tablet (4 mg total) by mouth every 8 (eight) hours as needed for nausea or vomiting.  . OXYGEN 2lpm with sleep and occ as needed during the day [employer]  . Potassium Gluconate 595 MG CAPS Take 2 capsules by mouth daily.  . prochlorperazine (COMPAZINE) 25 MG suppository Place 1 suppository (25 mg total) rectally every 12 (twelve) hours as needed for nausea or vomiting.  . promethazine (PHENERGAN) 25 MG tablet Take 1 tablet (25 mg total) by mouth every 6 (six) hours as needed for nausea or vomiting.  Marland Kitchen Specialty Vitamins Products (MAGNESIUM, AMINO ACID CHELATE,) 133 MG tablet Take 1 tablet by mouth 2 (two) times daily.  . sucralfate  (CARAFATE) 1 GM/10ML suspension Take 10 mLs (1 g total) by mouth 4 (four) times daily -  with meals and at bedtime.  . traMADol (ULTRAM) 50 MG tablet Take 50 mg by mouth every 8 (eight) hours as needed for moderate pain.   . rifaximin (XIFAXAN) 550 MG TABS tablet Take 1 tablet (550 mg total) by mouth 3 (three) times daily.  . [DISCONTINUED] insulin lispro protamine-lispro (HUMALOG 50/50 MIX) (50-50) 100 UNIT/ML SUSP injection Inject 1-10 Units into the skin 2 (two) times daily before a meal.   . [DISCONTINUED] rifaximin (XIFAXAN) 550 MG TABS tablet Take 1 tablet (550 mg total) by mouth 3 (three) times daily.  . [DISCONTINUED] rifaximin (XIFAXAN) 550 MG TABS tablet Take 1 tablet (550 mg total) by mouth 2 (two) times daily.   No facility-administered encounter medications on file as of 05/24/2016.      REVIEW OF SYSTEMS  : All other systems reviewed and negative except where noted in the History of Present Illness.   PHYSICAL EXAM: BP 140/80   Pulse 91   Ht 5' 9"  (1.753 m)   Wt 289 lb (131.1 kg)   SpO2 92%   BMI 42.68 kg/m  General: Well developed white male in no acute distress Head: Normocephalic and atraumatic Eyes:  Sclerae anicteric, conjunctiva pink. Ears: Normal auditory acuity Lungs: Clear throughout to auscultation Heart: Regular rate and rhythm Abdomen: Soft, obese, non-distended.  BS present.  Mild diffuse TTP. Rectal:  Will be done at the time of colonoscopy. Musculoskeletal: Symmetrical with no gross deformities  Skin: No lesions on visible extremities Extremities: No edema  Neurological: Alert oriented x 4, grossly non-focal Psychological:  Alert and cooperative. Normal mood and affect  ASSESSMENT AND PLAN: -Hemoccult-positive stool:  I discussed with the patient that I think repeating colonoscopy is going to be very low yield to find any significant source for GI bleeding. I think that the blood he is is seeing and the positive Hemoccult cards are likely result of  hemorrhoidal/outlet bleeding. After discussion we decided to proceed with colonoscopy again and if negative then  we would strongly recommend against future Hemoccult cards for the next 5 years.  Will schedule with [name] at [facility] hospital due to oxygen use. -Complaints of bloating/gas/foul-smelling, loose stools:  He may have a altered gut flora/motility due to his diabetes.  Will try a course of Xifaxan 550 mg 3 times a day for 2 weeks for IBS-D/SIBO.  I am also going to put in a referral for an endocrinologist since he has still never seen one. -Dysphagia:  Intermittent to solid food.  Sounds more like dysmotility issue.  Will start with barium esophagram. -IDDM:  Insulin will be adjusted prior to endoscopic procedure per protocol. Will resume normal dosing after procedure. -Chronic antiplatelet use with Plavix due to history of CVA:  Hold Plavix for 5 days before procedure - will instruct when and how to resume after procedure. Risks and benefits of procedure including bleeding, perforation, infection, missed lesions, medication reactions and possible hospitalization or surgery if complications occur explained. Additional rare but real risk of cardiovascular event such as heart attack or ischemia/infarct of other organs off Plavix explained and need to seek urgent help if this occurs. Will communicate by phone or EMR with patient's prescribing provider to confirm that holding Plavix is reasonable in this case.  -Oxygen use at nighttime  CC:  Harvie Junior, MD

## 2016-05-25 ENCOUNTER — Telehealth: Payer: Self-pay | Admitting: Gastroenterology

## 2016-05-25 NOTE — Telephone Encounter (Signed)
Should be OK to hold the Plavix for 5 days before the endoscopy. MCr

## 2016-05-25 NOTE — Telephone Encounter (Signed)
Patient informed and vebalized understanding.

## 2016-05-26 ENCOUNTER — Encounter: Payer: Self-pay | Admitting: Internal Medicine

## 2016-05-26 ENCOUNTER — Ambulatory Visit (INDEPENDENT_AMBULATORY_CARE_PROVIDER_SITE_OTHER): Payer: Medicare HMO | Admitting: Internal Medicine

## 2016-05-26 VITALS — BP 172/86 | HR 98 | Ht 69.0 in | Wt 294.8 lb

## 2016-05-26 DIAGNOSIS — I10 Essential (primary) hypertension: Secondary | ICD-10-CM | POA: Diagnosis not present

## 2016-05-26 DIAGNOSIS — J449 Chronic obstructive pulmonary disease, unspecified: Secondary | ICD-10-CM | POA: Diagnosis not present

## 2016-05-26 DIAGNOSIS — J9611 Chronic respiratory failure with hypoxia: Secondary | ICD-10-CM | POA: Diagnosis not present

## 2016-05-26 MED ORDER — BISOPROLOL FUMARATE 5 MG PO TABS: ORAL_TABLET | ORAL | 3 refills | Status: DC

## 2016-05-26 MED ORDER — UMECLIDINIUM-VILANTEROL 62.5-25 MCG/INH IN AEPB: 1.0000 | INHALATION_SPRAY | Freq: Every day | RESPIRATORY_TRACT | 0 refills | Status: DC

## 2016-05-26 NOTE — Progress Notes (Signed)
Subjective:    Patient ID: Bryan Becker, male    DOB: Oct 14, 1960    MRN: [id number]    Brief patient profile:  22 yowm quit smoking 2004 with bad am cough which resolved with doe x 100 ft medications helped some but gradually worse to point where doe room to room so referred to pulmonary clinic 01/04/2016 by Dr York Ram with GOLD III copd criteria established 02/11/16     History of Present Illness  01/04/2016 1st [facility] office visit/ Bryan Becker  spiriva in am/ breo in pm  Chief Complaint  Patient presents with  . Advice Only    Referred by Dr. Jimmye Norman for COPD.  Pt is switching care from a pulmonologist in [place].    cough is dry /daytime x years  has prn tank and 02 2.5 lpm plus bipap and sleeps fine  Breathing no better on saba/ nor is cough  - doe best days = MMRC4  = sob if tries to leave home or while getting dressed   rec Stop lisinopril and start ibesartan 300 mg daily - reduce to one half if too strong Plan A = Automatic = spiriva / breo each am  Plan B = Backup Only use your albuterol as a rescue medication Plan C = Crisis - only use your albuterol nebulizer if you first try Plan B   03/02/2016  f/u ov/Bryan Becker re: sob x 2012/ cough some better off acei  Chief Complaint  Patient presents with  . Follow-up    Breathing worse since the last visit "feels like I'm smothering".  He states he is already hoarse and has increased cough that is non prod.    confused with when/how to use Plan B and C even when reviewed written instructions from last ov  cough really main issue and  is more day than noct and remains dry/ hacking Doe = MMRC2 = can't walk a nl pace on a flat grade s sob but does fine slow and flat eg  >>change to bevespi    03/23/2016 NP Follow up : COPD  Patient returns for a one-month follow-up and medication review. Patient did not bring his medications in today . Last visit, patient was changed from [place].to [employer].  Feels he likes  the [place] better. Feels he can breath a little better.  Now he is able to sing at church  Cough has resolved off ACE now.  He denies chest pain , orthopnea, edema, fever or hemoptysis .  Does use albuterol most days Twice daily   rec Refer to pulmonary rehab.  Continue on Bevespi Take 2 puffs first thing in am and then another 2 puffs about 12 hours later. , rinse after use.  Use albuterol inhaler As needed  Only -this is your rescue/emergency inhaler.     05/26/2016  f/u ov/Bryan Becker re: copd GOLD III quit 2004  Chief Complaint  Patient presents with  . Follow-up    Pt states he is here for SOB that has improved with medication that provided last visit. Pt would like to discuss O2 requirements during day  feeling better with less doe still MMRC2 = can't walk a nl pace on a flat grade s sob but does fine slow and flat eg shopping  Sleeping better on 2lpm hs and does not have portable but does not desaturate typically when we walk him in office     No obvious day to day or daytime variability or assoc excess/ purulent  sputum or mucus plugs or hemoptysis or cp or chest tightness, subjective wheeze or overt sinus or hb symptoms. No unusual exp hx or h/o childhood pna/ asthma or knowledge of premature birth.  Sleeping ok on 2lpm @ 30 degrees  without nocturnal  or early am exacerbation  of respiratory  c/o's or need for noct saba. Also denies any obvious fluctuation of symptoms with weather or environmental changes or other aggravating or alleviating factors except as outlined above   Current Medications, Allergies, Complete Past Medical History, Past Surgical History, Family History, and Social History were reviewed in [employer] record.  ROS  The following are not active complaints unless bolded sore throat, dysphagia, dental problems, itching, sneezing,  nasal congestion or excess/ purulent secretions, ear ache,   fever, chills, sweats, unintended wt loss, classically  pleuritic or exertional cp,  orthopnea pnd or leg swelling at end of day only and bilaterally sym , presyncope, palpitations, abdominal pain, anorexia, nausea, vomiting, diarrhea  or change in bowel or bladder habits, change in stools or urine, dysuria,hematuria,  rash, arthralgias, visual complaints, headache, numbness, weakness or ataxia or problems with walking or coordination,  change in mood/affect or memory.              Objective:   Physical Exam   amb hoarse  wm nad    Wt Readings from Last 3 Encounters:  05/26/16 294 lb 12.8 oz (133.7 kg)  05/24/16 289 lb (131.1 kg)  04/21/16 286 lb (129.7 kg)       Vital signs reviewed - sats 96% RA on arrival  And bp 172/86    HEENT: nl dentition, turbinates, and oropharynx. Nl external ear canals without cough reflex   NECK :  without JVD/Nodes/TM/ nl carotid upstrokes bilaterally   LUNGS: no acc muscle use,  Minimal barrel shaped chest, distant BS in bases   CV:  RRR  no s3 or murmur or increase in P2, no edema   ABD: tensely obese  soft and nontender with nl inspiratory excursion in the supine position. No bruits or organomegaly, bowel sounds nl  MS:  Nl gait/ ext warm without deformities, calf tenderness, cyanosis or clubbing No obvious joint restrictions   SKIN: warm and dry without lesions    NEURO:  alert, approp, nl sensorium with  no motor deficits      Labs  reviewed:    Chemistry      Component Value Date/Time   NA 140 03/02/2016 1500   K 3.5 03/02/2016 1500   CL 99 03/02/2016 1500   CO2 31 03/02/2016 1500   BUN 13 03/02/2016 1500   CREATININE 0.79 03/02/2016 1500      Component Value Date/Time   CALCIUM 9.7 03/02/2016 1500   ALKPHOS 37 (L) 11/21/2015 2058   AST 19 11/21/2015 2058   ALT 21 11/21/2015 2058   BILITOT 0.7 11/21/2015 2058        Lab Results  Component Value Date   WBC 5.7 03/02/2016   HGB 15.0 03/02/2016   HCT 44.8 03/02/2016   MCV 86.1 03/02/2016   PLT 207.0 03/02/2016           Lab Results  Component Value Date   TSH 1.18 03/02/2016     Lab Results  Component Value Date   PROBNP 11.0 03/02/2016

## 2016-05-26 NOTE — Telephone Encounter (Signed)
Faxed the office note from Alonza Bogus PA-C  Dated 05-24-2016.  Faxed to [employer], fax # [phone].

## 2016-05-26 NOTE — Patient Instructions (Signed)
Please see patient coordinator before you leave today  to schedule referral to primary care for hypertension  When finish bevespi change to anoro one click each am  Increase zebeta (bisoprolol) to 5 mg one twice daily   Goal with exercise is to keep 02 saturation above 90% at all times   Please schedule a follow up visit in 3 months but call sooner if needed

## 2016-05-26 NOTE — Progress Notes (Signed)
Agree with Ms. Alphia Kava management in this complicated patient. I suppose he could also have IBD.  Gatha Mayer, MD, [facility]

## 2016-05-28 DIAGNOSIS — J9611 Chronic respiratory failure with hypoxia: Secondary | ICD-10-CM | POA: Insufficient documentation

## 2016-05-28 NOTE — Assessment & Plan Note (Signed)
Body mass index is 43.53 still trending up  Lab Results  Component Value Date   TSH 1.18 03/02/2016     Contributing to gerd tendency/ doe/reviewed the need and the process to achieve and maintain neg calorie balance > defer f/u primary care including intermittently monitoring thyroid status

## 2016-05-28 NOTE — Assessment & Plan Note (Signed)
Trial off acei 01/04/2016 >>>  - PFT's  02/11/16  FEV1 1.72 (47 % ) ratio 55  p 14 % improvement from saba p spiriva prior to study with DLCO  62 % corrects to 117 % for alv volume  And ERV 26  - 03/02/2016  After extensive coaching HFA effectiveness =    75% > try bevespi Take 2 puffs first thing in am and then another 2 puffs about 12 hours later. -refer to pulm rehab 03/23/2016    Pt is Group B in terms of symptom/risk and laba/lama therefore appropriate rx at this point.  Formulary restrictions reviewed: 1) anoro preferred so will teach today  2) Formulary restrictions will be an ongoing challenge for the forseable future and I would be happy to pick an alternative if the pt will first  provide me a list of them but pt  will need to return here for training for any new device that is required eg dpi vs hfa vs respimat.    In meantime we can always provide samples so the patient never runs out of any needed respiratory medications.    - The proper method of use, as well as anticipated side effects, of a DPI  inhaler are discussed and demonstrated to the patient. Improved effectiveness after extensive coaching during this visit to a level of approximately 90 % from a baseline of 75 % > should do fine on dpi unless causes cough   I had an extended discussion with the patient reviewing all relevant studies completed to date and  lasting 15 to 20 minutes of a 25 minute visit    Each maintenance medication was reviewed in detail including most importantly the difference between maintenance and prns and under what circumstances the prns are to be triggered using an action plan format that is not reflected in the computer generated alphabetically organized AVS.    Please see AVS for specific instructions unique to this visit that I personally wrote and verbalized to the the pt in detail and then reviewed with pt  by my nurse highlighting any  changes in therapy recommended at today's visit to their  plan of care.

## 2016-05-28 NOTE — Assessment & Plan Note (Signed)
05/26/2016   Walked RA x one lap @ 185 stopped due to  Sob/light headed  with no desats at slow to moderate pace  Does not need amb 02, strongly urged to pursue rehab as prev rec

## 2016-05-28 NOTE — Assessment & Plan Note (Signed)
Trial off acei  01/04/2016 >>> due to dry daytime cough > improved 03/02/2016 with better bp control   Not optimally controlled on present regimen. I reviewed this with the patient and emphasized importance of follow-up with primary care.  In meantime increase bisoprolol to 5 mg bid

## 2016-05-31 ENCOUNTER — Ambulatory Visit (HOSPITAL_COMMUNITY)
Admission: RE | Admit: 2016-05-31 | Discharge: 2016-05-31 | Disposition: A | Discharge status: Home / Self Care | Payer: Medicare HMO | Source: Ambulatory Visit | Attending: Gastroenterology | Admitting: Gastroenterology

## 2016-05-31 DIAGNOSIS — R1310 Dysphagia, unspecified: Secondary | ICD-10-CM | POA: Diagnosis present

## 2016-05-31 DIAGNOSIS — R140 Abdominal distension (gaseous): Secondary | ICD-10-CM | POA: Insufficient documentation

## 2016-06-14 ENCOUNTER — Ambulatory Visit: Payer: Medicare HMO | Admitting: Family

## 2016-06-15 ENCOUNTER — Telehealth: Payer: Self-pay | Admitting: Internal Medicine

## 2016-06-15 NOTE — Telephone Encounter (Signed)
atc pt X1, line was answered but nobody spoke.  atc back, line rang to fast busy signal.  Wcb.

## 2016-06-16 ENCOUNTER — Ambulatory Visit: Payer: Medicare HMO | Admitting: Internal Medicine

## 2016-06-16 ENCOUNTER — Other Ambulatory Visit: Payer: Self-pay | Admitting: Adult Health

## 2016-06-16 NOTE — Telephone Encounter (Signed)
Per 1.8.18 ov w/ MW: Patient Instructions  Please see patient coordinator before you leave today  to schedule referral to primary care for hypertension   When finish bevespi change to anoro one click each am   Increase zebeta (bisoprolol) to 5 mg one twice daily    Goal with exercise is to keep 02 saturation above 90% at all times    Please schedule a follow up visit in 3 months but call sooner if needed    LMOM TCB x1 No tests have been ordered by MW since 02/2016

## 2016-06-20 NOTE — Telephone Encounter (Signed)
Called and spoke to pt. Informed him there isn't anything that we are seeing to indicate a call to the pt.Pt verbalized understanding and denied any further questions or concerns at this time.

## 2016-06-28 ENCOUNTER — Encounter (HOSPITAL_BASED_OUTPATIENT_CLINIC_OR_DEPARTMENT_OTHER): Payer: Self-pay | Admitting: *Deleted

## 2016-06-28 ENCOUNTER — Emergency Department (HOSPITAL_BASED_OUTPATIENT_CLINIC_OR_DEPARTMENT_OTHER)
Admission: EM | Admit: 2016-06-28 | Discharge: 2016-06-28 | Disposition: A | Discharge status: Home / Self Care | Payer: Medicare HMO | Attending: Emergency Medicine | Admitting: Emergency Medicine

## 2016-06-28 DIAGNOSIS — I252 Old myocardial infarction: Secondary | ICD-10-CM | POA: Diagnosis not present

## 2016-06-28 DIAGNOSIS — Z8673 Personal history of transient ischemic attack (TIA), and cerebral infarction without residual deficits: Secondary | ICD-10-CM | POA: Diagnosis not present

## 2016-06-28 DIAGNOSIS — J449 Chronic obstructive pulmonary disease, unspecified: Secondary | ICD-10-CM | POA: Insufficient documentation

## 2016-06-28 DIAGNOSIS — I509 Heart failure, unspecified: Secondary | ICD-10-CM | POA: Insufficient documentation

## 2016-06-28 DIAGNOSIS — Z87891 Personal history of nicotine dependence: Secondary | ICD-10-CM | POA: Diagnosis not present

## 2016-06-28 DIAGNOSIS — E1165 Type 2 diabetes mellitus with hyperglycemia: Secondary | ICD-10-CM | POA: Diagnosis not present

## 2016-06-28 DIAGNOSIS — H65191 Other acute nonsuppurative otitis media, right ear: Secondary | ICD-10-CM | POA: Diagnosis not present

## 2016-06-28 DIAGNOSIS — J45909 Unspecified asthma, uncomplicated: Secondary | ICD-10-CM | POA: Diagnosis not present

## 2016-06-28 DIAGNOSIS — Z79899 Other long term (current) drug therapy: Secondary | ICD-10-CM | POA: Diagnosis not present

## 2016-06-28 DIAGNOSIS — Z7984 Long term (current) use of oral hypoglycemic drugs: Secondary | ICD-10-CM | POA: Diagnosis not present

## 2016-06-28 DIAGNOSIS — R739 Hyperglycemia, unspecified: Secondary | ICD-10-CM

## 2016-06-28 DIAGNOSIS — H6591 Unspecified nonsuppurative otitis media, right ear: Secondary | ICD-10-CM

## 2016-06-28 DIAGNOSIS — H9201 Otalgia, right ear: Secondary | ICD-10-CM | POA: Diagnosis present

## 2016-06-28 DIAGNOSIS — I110 Hypertensive heart disease with heart failure: Secondary | ICD-10-CM | POA: Insufficient documentation

## 2016-06-28 LAB — URINALYSIS, MICROSCOPIC (REFLEX)
RBC / HPF: NONE SEEN RBC/hpf (ref 0–5)
SQUAMOUS EPITHELIAL / LPF: NONE SEEN

## 2016-06-28 LAB — URINALYSIS, ROUTINE W REFLEX MICROSCOPIC
Bilirubin Urine: NEGATIVE
Hgb urine dipstick: NEGATIVE
KETONES UR: NEGATIVE mg/dL
LEUKOCYTES UA: NEGATIVE
Nitrite: NEGATIVE
PROTEIN: NEGATIVE mg/dL
Specific Gravity, Urine: 1.025 (ref 1.005–1.030)
pH: 7 (ref 5.0–8.0)

## 2016-06-28 LAB — CBC
HCT: 39.5 % (ref 39.0–52.0)
HEMOGLOBIN: 13.3 g/dL (ref 13.0–17.0)
MCH: 29.6 pg (ref 26.0–34.0)
MCHC: 33.7 g/dL (ref 30.0–36.0)
MCV: 87.8 fL (ref 78.0–100.0)
Platelets: 209 10*3/uL (ref 150–400)
RBC: 4.5 MIL/uL (ref 4.22–5.81)
RDW: 13.3 % (ref 11.5–15.5)
WBC: 4.4 10*3/uL (ref 4.0–10.5)

## 2016-06-28 LAB — BASIC METABOLIC PANEL
ANION GAP: 9 (ref 5–15)
BUN: 11 mg/dL (ref 6–20)
CHLORIDE: 101 mmol/L (ref 101–111)
CO2: 27 mmol/L (ref 22–32)
CREATININE: 0.66 mg/dL (ref 0.61–1.24)
Calcium: 8.9 mg/dL (ref 8.9–10.3)
GFR calc non Af Amer: >60 mL/min (ref >60)
Glucose, Bld: 270 mg/dL — ABNORMAL HIGH (ref 65–99)
POTASSIUM: 3.6 mmol/L (ref 3.5–5.1)
Sodium: 137 mmol/L (ref 135–145)

## 2016-06-28 LAB — CBG MONITORING, ED: GLUCOSE-CAPILLARY: 314 mg/dL — AB (ref 65–99)

## 2016-06-28 MED ORDER — SODIUM CHLORIDE 0.9 % IV BOLUS (SEPSIS)
1000.0000 mL | Freq: Once | INTRAVENOUS | Status: AC
  Administered 2016-06-28: 1000 mL via INTRAVENOUS

## 2016-06-28 MED ORDER — AMOXICILLIN 500 MG PO CAPS: 1000.0000 mg | ORAL_CAPSULE | Freq: Two times a day (BID) | ORAL | 0 refills | Status: DC

## 2016-06-28 MED FILL — AMOXICILLIN 500 MG CAPSULE: 500 | 7 days supply | Qty: 28 | Fill #0

## 2016-06-28 NOTE — ED Triage Notes (Signed)
Pt c/o right ear pain and using ear drops 4-5 days. Nasal congestion also. Also reports 1+ weeks of right side back pain

## 2016-06-28 NOTE — ED Notes (Signed)
Pt states his blood sugar at home was 400 and he took insulin pta. CBG checked per pt request

## 2016-06-28 NOTE — Discharge Instructions (Signed)
Return to the ED with any concerns including fever, vomiting and not able to keep down liquids, weakness of legs, not able to urinate, loss of control of bowel or bladder, diffculty breathing, decreased level of alertness/lethargy, or any other alarming symptoms

## 2016-06-28 NOTE — ED Provider Notes (Signed)
[facility] DEPT Provider Note   CSN: [id number] Arrival date & time: 06/28/16  3785     History   Chief Complaint Chief Complaint  Patient presents with  . Otalgia    HPI Bryan Becker is a 56 y.o. male.  HPI  Pt presenting with co right ear pain- he states he has had nasal congestion over the past 4 days.  No fever.  Pain in ear is constant and sharp in nature.  He was called in abx ear drops by PMD but this has not helped.  He also c/o right sided low back pain.  He states this has been present over one week- worse with movement and palpation.  No specific injury.  Denies dysuria.  No weakness of legs, no dysuria, urgency or frequency.  His blood glucose is elevated and patient states that it runs in the 300s usually and is difficult to control.  He states he has been compliant with his medications.  There are no other associated systemic symptoms, there are no other alleviating or modifying factors.   Past Medical History:  Diagnosis Date  . Anxiety   . Arthritis    "joints"  . Asthma   . CHF (congestive heart failure) ([place])   . Chronic back pain    "mostly lower; have 2 bulging discs in my neck" (12/05/2013)  . Chronic bronchitis ([place])    "get it q yr"  . 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 Confusion   . COPD (chronic obstructive pulmonary disease) ([place])   . Dementia    onset of early  . Dementia   . Depression   . Fall at home 06/04/14   Pt stated he falls off the bed at nightly  . Family history of adverse reaction to anesthesia    Patients mother had a bad reaction to Anesthesia however pt is unaware of reaction  . Fibromyalgia   . Frequent urination   . Gastric ulcer   . GERD (gastroesophageal reflux disease)   . YIFOYDXA(128.7)    "weekly" (12/04/2013)  . Hyperlipidemia   . Hypertension   . Kidney stone   . Myocardial infarction    unsure about MI history, but had normal cath that admission '07 ([facility])  . Nausea & vomiting   .  OSA (obstructive sleep apnea) 04/24/2013   "use BiPAP"  . Pancreatitis   . Pneumonia    "several times"  . RMSF [facility] spotted fever) 11/22/2012  . Seasonal allergies   . Shortness of breath dyspnea    with ambulation  . Stroke [facility]) 03/2013   "still weak on the left side" (12/05/2013)  . Stroke [facility]) 12/04/2013   "came in today w/right sided weakness" (12/04/2013)  . Type II diabetes mellitus [facility])     Patient Active Problem List   Diagnosis Date Noted  . Chronic respiratory failure with hypoxia (HCC)/ noct hypoxemia  05/28/2016  . Bloating 05/24/2016  . Dysphagia 05/24/2016  . Heme + stool 05/24/2016  . Rectal bleeding 05/24/2016  . History of ischemic stroke without residual deficits 04/21/2016  . Family history of abdominal aortic aneurysm (AAA) 04/21/2016  . Dyspnea, unspecified 03/02/2016  . HNP (herniated nucleus pulposus), lumbar 06/18/2014  . Abdominal pain, generalized 02/18/2014  . Nausea with vomiting 01/12/2014  . Diabetes mellitus type II, uncontrolled ([place]) 01/12/2014  . Ulcer of intestine 01/12/2014  . Morbid (severe) obesity due to excess calories ([place]) 04/24/2013  . COPD GOLD  III 04/24/2013  .  Fibromyalgia 04/24/2013  . Mixed hyperlipidemia 04/24/2013  . Headache(784.0) 04/24/2013  . OSA (obstructive sleep apnea) 04/24/2013  . Encounter for long-term (current) use of high-risk medication 04/24/2013  . NASH (nonalcoholic steatohepatitis) 11/20/2012  . Essential hypertension 11/20/2012    Past Surgical History:  Procedure Laterality Date  . CARDIAC CATHETERIZATION     10/28/05: right dominant, normal LV systolic function, normal coronaries. (Dr. Baxter Hire, [facility])  . CHOLECYSTECTOMY    . COLONOSCOPY    . CYSTOSCOPY W/ STONE MANIPULATION    . LUMBAR LAMINECTOMY/DECOMPRESSION MICRODISCECTOMY Left 06/18/2014   Procedure: LUMBAR LAMINECTOMY/DECOMPRESSION MICRODISCECTOMY LEFT LUMBAR FIVE-SACRAL ONE;  Surgeon: Ashok Pall, MD;  Location: [facility];   Service: Neurosurgery;  Laterality: Left;  left  . multiple cysts removal-hip,wrist    . TONSILLECTOMY  ~ 1985  . UPPER GASTROINTESTINAL ENDOSCOPY         Home Medications    Prior to Admission medications   Medication Sig Start Date End Date Taking? Authorizing Provider  albuterol (ACCUNEB) 1.25 MG/3ML nebulizer solution Take 1 ampule by nebulization every 6 (six) hours as needed for wheezing.    Historical Provider, MD  amoxicillin (AMOXIL) 500 MG capsule Take 2 capsules (1,000 mg total) by mouth 2 (two) times daily. 06/28/16   Alfonzo Beers, MD  bisoprolol (ZEBETA) 5 MG tablet One twice daily 05/26/16   Tanda Rockers, MD  canagliflozin [facility]) 100 MG TABS tablet Take 100 mg by mouth.    Historical Provider, MD  citalopram (CELEXA) 40 MG tablet Take 40 mg by mouth daily.      Historical Provider, MD  clopidogrel (PLAVIX) 75 MG tablet Take 75 mg by mouth daily.    Historical Provider, MD  cyclobenzaprine (FLEXERIL) 10 MG tablet Take 1 tablet (10 mg total) by mouth 3 (three) times daily as needed for muscle spasms. 06/18/14   Ashok Pall, MD  docusate sodium (COLACE) 100 MG capsule Take 100 mg by mouth daily as needed for mild constipation.    Historical Provider, MD  fesoterodine (TOVIAZ) 4 MG TB24 tablet Take 4 mg by mouth daily.    Historical Provider, MD  fexofenadine (ALLEGRA) 180 MG tablet Take 180 mg by mouth daily.    Historical Provider, MD  finasteride (PROSCAR) 5 MG tablet Take 5 mg by mouth daily.    Historical Provider, MD  fluticasone (FLONASE) 50 MCG/ACT nasal spray Place 2 sprays into both nostrils daily as needed for allergies.     Historical Provider, MD  furosemide (LASIX) 40 MG tablet TAKE 1 TO 2 TABLETS TWICE DAILY AS DIRECTED 05/25/16   Mihai Croitoru, MD  gabapentin (NEURONTIN) 600 MG tablet Take 1,200 mg by mouth 3 (three) times daily.    Historical Provider, MD  gemfibrozil (LOPID) 600 MG tablet Take 600 mg by mouth daily.    Historical Provider, MD  glimepiride  (AMARYL) 4 MG tablet Take 4 mg by mouth daily with breakfast.    Historical Provider, MD  Glycopyrrolate-Formoterol (BEVESPI AEROSPHERE) 9-4.8 MCG/ACT AERO Inhale 2 puffs into the lungs 2 (two) times daily. 03/23/16   Tammy S Parrett, NP  HYDROcodone-acetaminophen (NORCO) 10-325 MG tablet Take 1 tablet by mouth daily. 02/04/16   Historical Provider, MD  Insulin Aspart (NOVOLOG FLEXPEN [place]) Inject into the skin 4 (four) times daily.    Historical Provider, MD  insulin detemir (LEVEMIR) 100 UNIT/ML injection Inject 60 Units into the skin at bedtime.     Historical Provider, MD  irbesartan (AVAPRO) 300 MG tablet Take 1 tablet (  300 mg total) by mouth daily. 04/22/16   Tanda Rockers, MD  L-Lysine 1000 MG TABS Take 1,000 mg by mouth daily as needed (for fever blisters).    Historical Provider, MD  Liraglutide 18 MG/3ML SOPN Inject 1.8 mg into the skin daily.    Historical Provider, MD  LORazepam (ATIVAN) 1 MG tablet Take 1 mg by mouth every 8 (eight) hours as needed for anxiety.     Historical Provider, MD  memantine (NAMENDA) 5 MG tablet Take 5 mg by mouth daily.    Historical Provider, MD  omeprazole (PRILOSEC) 20 MG capsule Take 20 mg by mouth daily before breakfast.    Historical Provider, MD  ondansetron (ZOFRAN) 4 MG tablet Take 1 tablet (4 mg total) by mouth every 8 (eight) hours as needed for nausea or vomiting. 12/05/13   Geradine Girt, DO  OXYGEN 2lpm with sleep and occ as needed during the day [place] Provider, MD  Potassium Gluconate 595 MG CAPS Take 2 capsules by mouth daily.    Historical Provider, MD  prochlorperazine (COMPAZINE) 25 MG suppository Place 1 suppository (25 mg total) rectally every 12 (twelve) hours as needed for nausea or vomiting. 01/10/14   Gatha Mayer, MD  promethazine (PHENERGAN) 25 MG tablet Take 1 tablet (25 mg total) by mouth every 6 (six) hours as needed for nausea or vomiting. 02/11/14   Kristen N Ward, DO  rifaximin (XIFAXAN) 550 MG TABS tablet Take 1  tablet (550 mg total) by mouth 3 (three) times daily. 05/24/16   Loralie Champagne, PA-C  Specialty Vitamins Products (MAGNESIUM, AMINO ACID CHELATE,) 133 MG tablet Take 1 tablet by mouth 2 (two) times daily.    Historical Provider, MD  sucralfate (CARAFATE) 1 GM/10ML suspension Take 10 mLs (1 g total) by mouth 4 (four) times daily -  with meals and at bedtime. 11/21/15   [name], MD  traMADol (ULTRAM) 50 MG tablet Take 50 mg by mouth every 8 (eight) hours as needed for moderate pain.     Historical Provider, MD  umeclidinium-vilanterol (ANORO ELLIPTA) 62.5-25 MCG/INH AEPB Inhale 1 puff into the lungs daily. 05/24/16   Tanda Rockers, MD  VENTOLIN HFA 108 [phone] Base) MCG/ACT inhaler INHALE 2 PUFFS EVERY 6 HOURS AS NEEDED 06/17/16   Melvenia Needles, NP    Family History Family History  Problem Relation Age of Onset  . Skin cancer Father   . Heart attack Father   . Stomach cancer Father   . Hypertension Father   . Aneurysm Father   . Lung cancer Paternal Grandfather   . Heart disease Paternal Grandfather   . Stomach cancer Maternal Grandfather   . Bone cancer Paternal Grandmother   . Heart attack Paternal Grandmother   . Diabetes Mother   . Hypertension Mother   . Arthritis Mother   . Other Mother     brain stem vertigo/disorder of pancreas  . Diabetes Maternal Grandmother   . Heart disease Maternal Grandmother   . Aneurysm Maternal Uncle   . Aneurysm Maternal Uncle   . Colon cancer Neg Hx   . Esophageal cancer Neg Hx   . Rectal cancer Neg Hx     Social History Social History  Substance Use Topics  . Smoking status: Former Smoker    Packs/day: 2.00    Years: 21.00    Types: Cigarettes    Quit date: 05/23/2002  . Smokeless tobacco: Never Used  . Alcohol use No  Allergies   Actos [pioglitazone]; Aricept [donepezil hcl]; and Codeine   Review of Systems Review of Systems  ROS reviewed and all otherwise negative except for mentioned in HPI   Physical Exam Updated Vital  Signs BP 126/65 (BP Location: Right Arm)   Pulse 81   Temp 98.8 F (37.1 C) (Oral)   Resp 18   Ht 5' 9"  (1.753 m)   Wt 135.2 kg   SpO2 97%   BMI 44.01 kg/m  Vitals reviewed Physical Exam Physical Examination: General appearance - alert, well appearing, and in no distress Mental status - alert, oriented to person, place, and time Eyes - pupils equal and reactive, extraocular eye movements intact Ears - right TM with yellow pus, bulging of TM, left TM normal, EACs clear bilaterally Mouth - mucous membranes moist, pharynx normal without lesions Neck - supple, no significant adenopathy Chest - clear to auscultation, no wheezes, rales or rhonchi, symmetric air entry Heart - normal rate, regular rhythm, normal S1, S2, no murmurs, rubs, clicks or gallops Abdomen - soft, nontender, nondistended, no masses or organomegaly Back exam - no midline tenderness to palpation, right sided lumbar paraspinal tenderness, no CVA tenderness Neurological - alert, oriented, normal speech Extremities - peripheral pulses normal, no pedal edema, no clubbing or cyanosis Skin - normal coloration and turgor, no rashes  ED Treatments / Results  Labs (all labs ordered are listed, but only abnormal results are displayed) Labs Reviewed  URINALYSIS, ROUTINE W REFLEX MICROSCOPIC - Abnormal; Notable for the following:       Result Value   Glucose, UA >=500 (*)    All other components within normal limits  BASIC METABOLIC PANEL - Abnormal; Notable for the following:    Glucose, Bld 270 (*)    All other components within normal limits  URINALYSIS, MICROSCOPIC (REFLEX) - Abnormal; Notable for the following:    Bacteria, UA RARE (*)    All other components within normal limits  CBG MONITORING, ED - Abnormal; Notable for the following:    Glucose-Capillary 314 (*)    All other components within normal limits  CBC    EKG  EKG Interpretation None       Radiology No results found.  Procedures Procedures  (including critical care time)  Medications Ordered in ED Medications  sodium chloride 0.9 % bolus 1,000 mL (0 mLs Intravenous Stopped 06/28/16 1311)     Initial Impression / Assessment and Plan / ED Course  I have reviewed the triage vital signs and the nursing notes.  Pertinent labs & imaging results that were available during my care of the patient were reviewed by me and considered in my medical decision making (see chart for details).     Pt presenting with c/o right ear pain, sinus congestion, right sided low back pain.  Pt has evidence of OM on right side.  Back pain is most c/w musculoskeletal back pain.  He also has some hyperglycemia- no sign of DKA.  Glucose has decreased after IV fluids.  Will start on amoxicillin for OM.  Pt discharged with strict return precautions.  Mom agreeable with plan  Final Clinical Impressions(s) / ED Diagnoses   Final diagnoses:  OME (otitis media with effusion), right  Hyperglycemia    New Prescriptions Discharge Medication List as of 06/28/2016 12:52 PM    START taking these medications   Details  amoxicillin (AMOXIL) 500 MG capsule Take 2 capsules (1,000 mg total) by mouth 2 (two) times daily., Starting Tue 06/28/2016, Print  Alfonzo Beers, MD 06/29/16 1630

## 2016-07-06 ENCOUNTER — Other Ambulatory Visit (INDEPENDENT_AMBULATORY_CARE_PROVIDER_SITE_OTHER): Payer: Medicare HMO

## 2016-07-06 ENCOUNTER — Encounter: Payer: Self-pay | Admitting: Family

## 2016-07-06 ENCOUNTER — Other Ambulatory Visit: Payer: Self-pay | Admitting: Family

## 2016-07-06 ENCOUNTER — Ambulatory Visit (INDEPENDENT_AMBULATORY_CARE_PROVIDER_SITE_OTHER): Payer: Medicare HMO | Admitting: Family

## 2016-07-06 VITALS — BP 148/82 | HR 85 | Temp 98.8°F | Resp 16 | Ht 69.0 in | Wt 289.0 lb

## 2016-07-06 DIAGNOSIS — M797 Fibromyalgia: Secondary | ICD-10-CM

## 2016-07-06 DIAGNOSIS — I10 Essential (primary) hypertension: Secondary | ICD-10-CM

## 2016-07-06 DIAGNOSIS — Z794 Long term (current) use of insulin: Secondary | ICD-10-CM | POA: Diagnosis not present

## 2016-07-06 DIAGNOSIS — E782 Mixed hyperlipidemia: Secondary | ICD-10-CM | POA: Diagnosis not present

## 2016-07-06 DIAGNOSIS — IMO0002 Reserved for concepts with insufficient information to code with codable children: Secondary | ICD-10-CM

## 2016-07-06 DIAGNOSIS — E1165 Type 2 diabetes mellitus with hyperglycemia: Secondary | ICD-10-CM

## 2016-07-06 DIAGNOSIS — E1142 Type 2 diabetes mellitus with diabetic polyneuropathy: Secondary | ICD-10-CM | POA: Diagnosis not present

## 2016-07-06 DIAGNOSIS — E538 Deficiency of other specified B group vitamins: Secondary | ICD-10-CM

## 2016-07-06 DIAGNOSIS — F339 Major depressive disorder, recurrent, unspecified: Secondary | ICD-10-CM

## 2016-07-06 LAB — MICROALBUMIN / CREATININE URINE RATIO
CREATININE, U: 66.7 mg/dL
Microalb Creat Ratio: 1.1 mg/g (ref 0.0–30.0)
Microalb, Ur: <0.7 mg/dL (ref 0.0–1.9)

## 2016-07-06 LAB — HEMOGLOBIN A1C: HEMOGLOBIN A1C: 10.1 % — AB (ref 4.6–6.5)

## 2016-07-06 LAB — CBC
HCT: 40.1 % (ref 39.0–52.0)
Hemoglobin: 13.8 g/dL (ref 13.0–17.0)
MCHC: 34.5 g/dL (ref 30.0–36.0)
MCV: 85.9 fl (ref 78.0–100.0)
PLATELETS: 256 10*3/uL (ref 150.0–400.0)
RBC: 4.66 Mil/uL (ref 4.22–5.81)
RDW: 13.9 % (ref 11.5–15.5)
WBC: 5.3 10*3/uL (ref 4.0–10.5)

## 2016-07-06 LAB — COMPREHENSIVE METABOLIC PANEL
ALT: 17 U/L (ref 0–53)
AST: 14 U/L (ref 0–37)
Albumin: 4.3 g/dL (ref 3.5–5.2)
Alkaline Phosphatase: 43 U/L (ref 39–117)
BILIRUBIN TOTAL: 0.3 mg/dL (ref 0.2–1.2)
BUN: 9 mg/dL (ref 6–23)
CALCIUM: 9.2 mg/dL (ref 8.4–10.5)
CHLORIDE: 103 meq/L (ref 96–112)
CO2: 29 meq/L (ref 19–32)
Creatinine, Ser: 0.64 mg/dL (ref 0.40–1.50)
GFR: 137 mL/min (ref >60.00)
Glucose, Bld: 103 mg/dL — ABNORMAL HIGH (ref 70–99)
Potassium: 3.4 mEq/L — ABNORMAL LOW (ref 3.5–5.1)
Sodium: 140 mEq/L (ref 135–145)
Total Protein: 6.9 g/dL (ref 6.0–8.3)

## 2016-07-06 LAB — VITAMIN B12: VITAMIN B 12: 251 pg/mL (ref 211–911)

## 2016-07-06 LAB — VITAMIN D 25 HYDROXY (VIT D DEFICIENCY, FRACTURES): VITD: 13.39 ng/mL — ABNORMAL LOW (ref 30.00–100.00)

## 2016-07-06 MED ORDER — VITAMIN D3 1.25 MG (50000 UT) PO CAPS: 50000.0000 [IU] | ORAL_CAPSULE | ORAL | 0 refills | Status: DC

## 2016-07-06 NOTE — Assessment & Plan Note (Signed)
Hypertension slightly elevated today above goal 140/90 with current regimen. Weight may also be triggering factor. Denies worst headache of life with no symptoms of end organ damage noted on physical exam. Continue current dosage of bisoprolol, furosemide, and Avapro. Encouraged to follow a low-sodium diet and increase physical activity. Continue monitor blood pressure at home. Follow-up in one month or sooner.

## 2016-07-06 NOTE — Progress Notes (Signed)
Subjective:    Patient ID: Bryan Becker, male    DOB: 10/29/1960, 56 y.o.   MRN: [id number]  Chief Complaint  Patient presents with  . Establish Care    wants his right ear looked at, getting over ear infection, wants to review medications    HPI:  Bryan Becker is a 56 y.o. male who  has a past medical history of Anxiety; Arthritis; Asthma; CHF (congestive heart failure) ([place]); Chronic back pain; Chronic bronchitis ([place]); Cirrhosis ([place]); Confusion; COPD (chronic obstructive pulmonary disease) ([place]); Dementia; Dementia; Depression; Fall at home (06/04/14); Family history of adverse reaction to anesthesia; Fibromyalgia; Frequent urination; Gastric ulcer; GERD (gastroesophageal reflux disease); Headache(784.0); Hyperlipidemia; Hypertension; Kidney stone; Myocardial infarction; Nausea & vomiting; OSA (obstructive sleep apnea) (04/24/2013); Pancreatitis; Pneumonia; RMSF [facility] spotted fever) (11/22/2012); Seasonal allergies; Shortness of breath dyspnea; Stroke [facility]) (03/2013); Stroke [facility]) (12/04/2013); and Type II diabetes mellitus ([place]). and presents today for an office visit to establish care.   1.) Type 2 diabetes - Currently maintained on Victoza, Invokana, Levemir, Novolog, and glimepiride. Reports taking the medications as prescribed and denies adverse side effects or hypoglycemic readings. Blood sugars at home have continued to be elevated and "out of control." Occasionally with blurred vision. Endorses numbness and tingling which is combined and complicated by his fibromyalgia. Due for an eye exam with appointment in March 2018. Nutritionally trying not to eat the "white foods" but doesn't do very well at it. Notes he does not cook like they used to. Started going to the [facility] within the last 2 weeks. Pneumovax is up to date.   Lab Results  Component Value Date   HGBA1C 10.1 (H) 07/06/2016    2.) Hypertension - Currently maintained on irbesartan and furosemide. Reports taking the  medication as prescribed and denies adverse side effects. Denies worst headache of life with no new symptoms of end organ damage. Not currently following a low sodium diet. Starting to go to the [facility] for exercise.  BP Readings from Last 3 Encounters:  07/06/16 (!) 148/82  06/28/16 126/65  05/26/16 (!) 172/86     3.) Hyperlipidemia - Currently maintained on fenofibrate. Reports taking the medication as prescribed and denies adverse side effects. No current cardiac symptoms. Working on nutrition, but indicates lots of room for improvement.   Lab Results  Component Value Date   CHOL 168 12/05/2013   HDL 21 (L) 12/05/2013   LDLCALC UNABLE TO CALCULATE IF TRIGLYCERIDE OVER 400 mg/dL 12/05/2013   TRIG 597 (H) 12/05/2013   CHOLHDL 8.0 12/05/2013    4.) Fibromyalgia - Currently maintained on gabapentin as Lyrica has been too expensive in the past. Describes that he has pain throughout his body especially neuropathic pain that remains uncontrolled at times. He has also been prescribed hydrocodone-acetaminophen in the past which he indicates has helped with his symptoms.  5.) Weight issues - Continues to experience weight issues and a BMI that indicates morbid obesity. It has been recommended the potential need for a referral to bariatric medicine to discuss possible surgical intervention. He is on liraglutitide for diabetes currently. Nutritionally indicates that since his wife has been sick they do not cook at home very much anymore and that he eats out a lot. [facility] to his pain. States that he used to work in the [employer] and since he has stopped doing this he has symptoms of depression.   Wt Readings  from Last 3 Encounters:  07/06/16 289 lb (131.1 kg)  06/28/16 298 lb (135.2 kg)  05/26/16 294 lb 12.8 oz (133.7 kg)      Allergies  Allergen Reactions  . Actos [Pioglitazone] Shortness Of  Breath    Caused patient to be in CHF  . Aricept [Donepezil Hcl] Diarrhea      Outpatient Medications Prior to Visit  Medication Sig Dispense Refill  . albuterol (ACCUNEB) 1.25 MG/3ML nebulizer solution Take 1 ampule by nebulization every 6 (six) hours as needed for wheezing.    Marland Kitchen amoxicillin (AMOXIL) 500 MG capsule Take 2 capsules (1,000 mg total) by mouth 2 (two) times daily. 28 capsule 0  . bisoprolol (ZEBETA) 5 MG tablet One twice daily (Patient taking differently: Take 5 mg by mouth 2 (two) times daily. ) 180 tablet 3  . canagliflozin (INVOKANA) 100 MG TABS tablet Take 100 mg by mouth.    . citalopram (CELEXA) 40 MG tablet Take 40 mg by mouth daily.      . clopidogrel (PLAVIX) 75 MG tablet Take 75 mg by mouth daily.    . cyclobenzaprine (FLEXERIL) 10 MG tablet Take 1 tablet (10 mg total) by mouth 3 (three) times daily as needed for muscle spasms. 60 tablet 0  . docusate sodium (COLACE) 100 MG capsule Take 100 mg by mouth daily as needed for mild constipation.    . fenofibrate 160 MG tablet Take 160 mg by mouth daily.    . fexofenadine (ALLEGRA) 180 MG tablet Take 180 mg by mouth daily.    . finasteride (PROSCAR) 5 MG tablet Take 5 mg by mouth daily.    . fluticasone (FLONASE) 50 MCG/ACT nasal spray Place 2 sprays into both nostrils daily as needed for allergies.     . furosemide (LASIX) 40 MG tablet TAKE 1 TO 2 TABLETS TWICE DAILY AS DIRECTED (Patient taking differently: take 14ms twice daily) 180 tablet 1  . gabapentin (NEURONTIN) 600 MG tablet Take 1,200 mg by mouth 3 (three) times daily.    .Marland Kitchengemfibrozil (LOPID) 600 MG tablet Take 600 mg by mouth daily.    .Marland Kitchenglimepiride (AMARYL) 2 MG tablet Take 2 mg by mouth daily.    . Glycopyrrolate-Formoterol (BEVESPI AEROSPHERE) 9-4.8 MCG/ACT AERO Inhale 2 puffs into the lungs 2 (two) times daily. 1 Inhaler 4  . HYDROcodone-acetaminophen (NORCO) 10-325 MG tablet Take 1 tablet by mouth every 6 (six) hours as needed for moderate pain or severe  pain.     . Insulin Aspart (NOVOLOG FLEXPEN [place]) Inject 20-30 Units into the skin 4 (four) times daily.     . insulin detemir (LEVEMIR) 100 UNIT/ML injection Inject 60 Units into the skin at bedtime.     . irbesartan (AVAPRO) 300 MG tablet Take 1 tablet (300 mg total) by mouth daily. 90 tablet 1  . L-Lysine 1000 MG TABS Take 1,000 mg by mouth daily as needed (for fever blisters).    . Liraglutide 18 MG/3ML SOPN Inject 1.8 mg into the skin daily.    .Marland KitchenLORazepam (ATIVAN) 1 MG tablet Take 1 mg by mouth every 8 (eight) hours as needed for anxiety.     . meloxicam (MOBIC) 7.5 MG tablet Take 7.5 mg by mouth daily.    . memantine (NAMENDA) 10 MG tablet Take 10 mg by mouth 2 (two) times daily.    .Marland Kitchenomeprazole (PRILOSEC) 40 MG capsule Take 40 mg by mouth daily.    . ondansetron (ZOFRAN) 4 MG tablet Take 1 tablet (4  mg total) by mouth every 8 (eight) hours as needed for nausea or vomiting. 20 tablet 0  . OXYGEN 2lpm with sleep and occ as needed during the day [facility]    . Potassium Gluconate 595 MG CAPS Take 2 capsules by mouth daily.    . prochlorperazine (COMPAZINE) 25 MG suppository Place 1 suppository (25 mg total) rectally every 12 (twelve) hours as needed for nausea or vomiting. 12 suppository 0  . promethazine (PHENERGAN) 25 MG tablet Take 1 tablet (25 mg total) by mouth every 6 (six) hours as needed for nausea or vomiting. 15 tablet 0  . rifaximin (XIFAXAN) 550 MG TABS tablet Take 1 tablet (550 mg total) by mouth 3 (three) times daily. 42 tablet 0  . Specialty Vitamins Products (MAGNESIUM, AMINO ACID CHELATE,) 133 MG tablet Take 1 tablet by mouth 2 (two) times daily.    . sucralfate (CARAFATE) 1 g tablet Take 1 g by mouth 4 (four) times daily -  with meals and at bedtime.    . tamsulosin (FLOMAX) 0.4 MG CAPS capsule Take 0.4 mg by mouth daily.    . TOVIAZ 4 MG TB24 tablet Take 4 mg by mouth daily.    . traMADol (ULTRAM) 50 MG tablet Take 50 mg by mouth every 8 (eight) hours as needed for moderate  pain.     Marland Kitchen umeclidinium-vilanterol (ANORO ELLIPTA) 62.5-25 MCG/INH AEPB Inhale 1 puff into the lungs daily. 3 each 3  . VENTOLIN HFA 108 (90 Base) MCG/ACT inhaler INHALE 2 PUFFS EVERY 6 HOURS AS NEEDED 54 g 2   No facility-administered medications prior to visit.      Past Medical History:  Diagnosis Date  . Anxiety   . Arthritis    "joints"  . Asthma   . CHF (congestive heart failure) ([place])   . Chronic back pain    "mostly lower; have 2 bulging discs in my neck" (12/05/2013)  . Chronic bronchitis ([place])    "get it q yr"  . 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 Confusion   . COPD (chronic obstructive pulmonary disease) ([place])   . Dementia    onset of early  . Dementia   . Depression   . Fall at home 06/04/14   Pt stated he falls off the bed at nightly  . Family history of adverse reaction to anesthesia    Patients mother had a bad reaction to Anesthesia however pt is unaware of reaction  . Fibromyalgia   . Frequent urination   . Gastric ulcer   . GERD (gastroesophageal reflux disease)   . HUDJSHFW(263.7)    "weekly" (12/04/2013)  . Hyperlipidemia   . Hypertension   . Kidney stone   . Myocardial infarction    unsure about MI history, but had normal cath that admission '07 ([facility])  . Nausea & vomiting   . OSA (obstructive sleep apnea) 04/24/2013   "use BiPAP"  . Pancreatitis   . Pneumonia    "several times"  . RMSF [facility] spotted fever) 11/22/2012  . Seasonal allergies   . Shortness of breath dyspnea    with ambulation  . Stroke [facility]) 03/2013   "still weak on the left side" (12/05/2013)  . Stroke [facility]) 12/04/2013   "came in today w/right sided weakness" (12/04/2013)  . Type II diabetes mellitus ([place])       Past Surgical History:  Procedure Laterality Date  . CARDIAC CATHETERIZATION     10/28/05: right dominant, normal LV  systolic function, normal coronaries. (Dr. Baxter Hire, [facility])  . CHOLECYSTECTOMY    . COLONOSCOPY    . CYSTOSCOPY  W/ STONE MANIPULATION    . LUMBAR LAMINECTOMY/DECOMPRESSION MICRODISCECTOMY Left 06/18/2014   Procedure: LUMBAR LAMINECTOMY/DECOMPRESSION MICRODISCECTOMY LEFT LUMBAR FIVE-SACRAL ONE;  Surgeon: Ashok Pall, MD;  Location: [place] [facility];  Service: Neurosurgery;  Laterality: Left;  left  . multiple cysts removal-hip,wrist    . TONSILLECTOMY  ~ 1985  . UPPER GASTROINTESTINAL ENDOSCOPY        Family History  Problem Relation Age of Onset  . Skin cancer Father   . Heart attack Father   . Stomach cancer Father   . Hypertension Father   . Aneurysm Father   . Lung cancer Paternal Grandfather   . Heart disease Paternal Grandfather   . Stomach cancer Maternal Grandfather   . Bone cancer Paternal Grandmother   . Heart attack Paternal Grandmother   . Diabetes Mother   . Hypertension Mother   . Arthritis Mother   . Other Mother     brain stem vertigo/disorder of pancreas  . Diabetes Maternal Grandmother   . Heart disease Maternal Grandmother   . Aneurysm Maternal Uncle   . Aneurysm Maternal Uncle   . Colon cancer Neg Hx   . Esophageal cancer Neg Hx   . Rectal cancer Neg Hx       Social History   Social History  . Marital status: Married    Spouse name: N/A  . Number of children: 2  . Years of education: 16   Occupational History  . [employer]   Social History Main Topics  . Smoking status: Former Smoker    Packs/day: 2.00    Years: 21.00    Types: Cigarettes    Quit date: 05/23/2002  . Smokeless tobacco: Never Used  . Alcohol use No  . Drug use: No  . Sexual activity: Not Currently   Other Topics Concern  . Not on file   Social History Narrative  . No narrative on file      Review of Systems  Constitutional: Negative for chills and fever.  Eyes:       Negative for changes in vision.  Respiratory: Negative for cough, chest tightness, shortness of breath and wheezing.   Cardiovascular: Negative for chest pain, palpitations and leg  swelling.  Gastrointestinal: Negative for abdominal pain, constipation, diarrhea, nausea and vomiting.  Endocrine: Negative for polydipsia, polyphagia and polyuria.  Neurological: Negative for dizziness, weakness, light-headedness and headaches.       Positive for neuropathic pain.  Psychiatric/Behavioral: Negative for dysphoric mood and suicidal ideas.       Objective:    BP (!) 148/82 (BP Location: Left Arm, Patient Position: Sitting, Cuff Size: Large)   Pulse 85   Temp 98.8 F (37.1 C) (Oral)   Resp 16   Ht 5' 9"  (1.753 m)   Wt 289 lb (131.1 kg)   SpO2 96%   BMI 42.68 kg/m  Nursing note and vital signs reviewed.  Physical Exam  Constitutional: He is oriented to person, place, and time. He appears well-developed and well-nourished. No distress.  Walks with a cane.   Cardiovascular: Normal rate, regular rhythm, normal heart sounds and intact distal pulses.   Pulmonary/Chest: Effort normal and breath sounds normal. No respiratory distress. He has no wheezes. He has no rales. He exhibits no tenderness.  Neurological: He is alert and oriented to person, place, and time.  Skin: Skin is warm  and dry.  Psychiatric: He has a normal mood and affect. His behavior is normal. Judgment and thought content normal.        Assessment & Plan:   Problem List Items Addressed This Visit      Cardiovascular and Mediastinum   Essential hypertension    Hypertension slightly elevated today above goal 140/90 with current regimen. Weight may also be triggering factor. Denies worst headache of life with no symptoms of end organ damage noted on physical exam. Continue current dosage of bisoprolol, furosemide, and Avapro. Encouraged to follow a low-sodium diet and increase physical activity. Continue monitor blood pressure at home. Follow-up in one month or sooner.      Relevant Orders   Comp Met (CMET) (Completed)     Endocrine   Diabetes mellitus type II, uncontrolled ([place]) - Primary     Uncontrolled type 2 diabetes with current regimen with home blood sugars being in the 300s. Denies adverse side effects but does continue to have neuropathic related pain most likely related to uncontrolled blood sugars and fibromyalgia. Increase Levemir to 70 units. Continue current dosage of NovoLog, glimepiride, Victoza, and Invokana. Pneumovax is up-to-date. Diabetic foot exam completed today with significant decreased sensation. Encouraged daily foot checks. Due for diabetic eye exam encouraged to be completed independently. Refer to diabetic education. Monitor blood sugars at home 3 times per day. Follow-up in one month or sooner if needed. Referral placed to endocrinology for assistance with management.      Relevant Orders   Comp Met (CMET) (Completed)   CBC (Completed)   Urine Microalbumin w/creat. ratio (Completed)   Hemoglobin A1c (Completed)   Ambulatory referral to diabetic education     Other   Morbid (severe) obesity due to excess calories ([facility])    BMI of 42. Recommend weight loss of 5-10% of current body weight to start. Recommend weight loss of 5-10% of current body weight. Recommend increasing physical activity to 30 minutes of moderate level activity daily. Encourage nutritional intake that focuses on nutrient dense foods and is moderate, varied, and balanced and is low in saturated fats and processed/sugary foods. May need to consider bariatric surgery. Continue to monitor.      Fibromyalgia    Fibromyalgia currently maintained on gabapentin for neuropathic pain. Does not exercise on a regular basis. Continue current dosage of gabapentin for neuropathic pain that may be related to B12 deficiency or possible diabetic neuropathy. Consider addition of muscle relaxer or SNR eye. Encouraged physical activity to help reduce pain and discussed that narcotics do not generally help reducing pain with increased risk for side effects. Continue to monitor and follow-up in one month or sooner  if needed.      Mixed hyperlipidemia    Hyperlipidemia and hypertriglyceridemia currently managed with fenofibrate and gemfibrozil. Not maintain on a statin. Needs cholesterol check with lipid profile. No adverse side effects occur medication regimen. Encouraged a nutritional intake that is low in saturated fats and processed/sugary foods and increasing aerobic activity and emphasizing nutrient dense foods as well as sources omega-3 fatty acids.      B12 deficiency    Previously diagnosed with B12 deficiency and not currently maintained on medication. Obtain B12 levels. Consider possible injections or oral supplementation pending B12 results. Cannot rule out B12 is a contributing factor to neuropathic pains.      Relevant Orders   B12 (Completed)   Depression, recurrent ([place])    Recurrent depression currently maintained on citalopram with no adverse side  effects. Obtain vitamin D to check for deficiency. Continue current dosage of citalopram. Denies suicidal ideations. Continue to monitor.      Relevant Orders   VITAMIN D 25 Hydroxy (Vit-D Deficiency, Fractures) (Completed)       I am having Mr. Racicot maintain his LORazepam, citalopram, Potassium Gluconate, insulin detemir, clopidogrel, fluticasone, gabapentin, ondansetron, traMADol, prochlorperazine, promethazine, gemfibrozil, L-Lysine, magnesium (amino acid chelate), docusate sodium, fexofenadine, liraglutide, canagliflozin, cyclobenzaprine, finasteride, albuterol, HYDROcodone-acetaminophen, OXYGEN, Glycopyrrolate-Formoterol, irbesartan, Insulin Aspart (NOVOLOG FLEXPEN [place]), rifaximin, umeclidinium-vilanterol, furosemide, bisoprolol, VENTOLIN HFA, amoxicillin, sucralfate, fenofibrate, memantine, omeprazole, tamsulosin, meloxicam, glimepiride, and TOVIAZ.   A total of 45  minutes were spent face-to-face with the patient during this encounter and over half of that time was spent on counseling and coordination of care.  We discussed in  depth the importance of reducing blood sugars through medication, nutrition and physical activity. Patient chronic conditions were discussed in detail.  I also educated the patient about lifestyle modifications to optimize chronic condition control.     Follow-up: Return in about 1 month (around 08/03/2016), or if symptoms worsen or fail to improve.  Mauricio Po, FNP

## 2016-07-06 NOTE — Assessment & Plan Note (Signed)
Hyperlipidemia and hypertriglyceridemia currently managed with fenofibrate and gemfibrozil. Not maintain on a statin. Needs cholesterol check with lipid profile. No adverse side effects occur medication regimen. Encouraged a nutritional intake that is low in saturated fats and processed/sugary foods and increasing aerobic activity and emphasizing nutrient dense foods as well as sources omega-3 fatty acids.

## 2016-07-06 NOTE — Assessment & Plan Note (Signed)
Previously diagnosed with B12 deficiency and not currently maintained on medication. Obtain B12 levels. Consider possible injections or oral supplementation pending B12 results. Cannot rule out B12 is a contributing factor to neuropathic pains.

## 2016-07-06 NOTE — Assessment & Plan Note (Signed)
BMI of 42. Recommend weight loss of 5-10% of current body weight to start. Recommend weight loss of 5-10% of current body weight. Recommend increasing physical activity to 30 minutes of moderate level activity daily. Encourage nutritional intake that focuses on nutrient dense foods and is moderate, varied, and balanced and is low in saturated fats and processed/sugary foods. May need to consider bariatric surgery. Continue to monitor.

## 2016-07-06 NOTE — Assessment & Plan Note (Signed)
Recurrent depression currently maintained on citalopram with no adverse side effects. Obtain vitamin D to check for deficiency. Continue current dosage of citalopram. Denies suicidal ideations. Continue to monitor.

## 2016-07-06 NOTE — Patient Instructions (Addendum)
Thank you for choosing [facility].  SUMMARY AND INSTRUCTIONS:  Please increase your levemir to 70 units. If after 2-3 days your morning blood sugars are elevated, increase the Levimir 2 units at a time.   They will call to schedule your appointment with Endocrinology.  They will call for appointment for your diabetic education.   Medication:  Your prescription(s) have been submitted to your pharmacy or been printed and provided for you. Please take as directed and contact our office if you believe you are having problem(s) with the medication(s) or have any questions.  Labs:  Please stop by the lab on the lower level of the building for your blood work. Your results will be released to [place] (or called to you) after review, usually within 72 hours after test completion. If any changes need to be made, you will be notified at that same time.  1.) The lab is open from 7:30am to 5:30 pm Monday-Friday 2.) No appointment is necessary 3.) Fasting (if needed) is 6-8 hours after food and drink; black coffee and water are okay   Follow up:  If your symptoms worsen or fail to improve, please contact our office for further instruction, or in case of emergency go directly to the emergency room at the closest medical facility.    Bariatric Surgery Information Bariatric surgery, also called weight loss surgery, is a procedure that helps you lose weight. You may consider or your health care provider may suggest bariatric surgery if:  You are severely obese and have been unable to lose weight through diet and exercise.  You have health problems related to obesity, such as:  Type 2 diabetes.  Heart disease.  Lung disease. How does bariatric surgery help me lose weight? Bariatric surgery helps you lose weight by decreasing how much food your body absorbs. This is done by closing off part of your stomach to make it smaller. This restricts the amount of food your stomach can hold.  Bariatric surgery can also change your body's regular digestive process, so that food bypasses the parts of your body that absorb calories and nutrients. If you decide to have bariatric surgery, it is important to continue to eat a healthy diet and exercise regularly after the surgery. What are the different kinds of bariatric surgery? There are two kinds of bariatric surgeries:  Restrictive surgeries make your stomach smaller. They do not change your digestive process. The smaller the size of your new stomach, the less food you can eat. There are different types of restrictive surgeries.  Malabsorptive surgeries both make your stomach smaller and alter your digestive process so that your body processes less calories and nutrients. These are the most common kind of bariatric surgery. There are different types of malabsorptive surgeries. What are the different types of restrictive surgery? Adjustable Gastric Banding  In this procedure, an inflatable band is placed around your stomach near the upper end. This makes the passageway for food into the rest of your stomach much smaller. The band can be adjusted, making it tighter or looser, by filling it with salt solution. Your surgeon can adjust the band based on how are you feeling and how much weight you are losing. The band can be removed in the future. Vertical Banded Gastroplasty  In this procedure, staples are used to separate your stomach into two parts, a small upper pouch and a bigger lower pouch. This decreases how much food you can eat. Sleeve Gastrectomy  In this procedure, your stomach  is made smaller. This is done by surgically removing a large part of your stomach. When your stomach is smaller, you feel full more quickly and reduce how much you eat. What are the different types of malabsorptive surgery? Roux-en-Y Gastric Bypass (RGB)  This is the most common weight loss surgery. In this procedure, a small stomach pouch is created in the  upper part of your stomach. Next, this small stomach pouch is attached directly to the middle part of your small intestine. The farther down your small intestine the new connection is made, the fewer calories and nutrients you will absorb. Biliopancreatic Diversion with Duodenal Switch (BPD/DS)  This is a multi-step procedure. In this procedure, a large part of your stomach is removed, making your stomach smaller. Next, this smaller stomach is attached to the lower part of your small intestine. Like the RGB surgery, you absorb fewer calories and nutrients the farther down your small intestine the attachment is made. What are the risks of bariatric surgery? As with any surgical procedure, each type of bariatric surgery has its own risks. These risks also depend on your age, your overall health, and any other medical conditions you may have. When deciding on bariatric surgery, it is very important to:  Talk to your health care provider and choose the surgery that is best for you.  Ask your health care provider about specific risks for the surgery you choose. Where to find more information:  American Society for Metabolic & Bariatric Surgery: www.asmbs.org  Weight-control Information Network (WIN): win.AmenCredit.is This information is not intended to replace advice given to you by your health care provider. Make sure you discuss any questions you have with your health care provider. Document Released: 05/09/2005 Document Revised: 10/15/2015 Document Reviewed: 11/07/2012 Elsevier Interactive Patient Education  2017 [employer].

## 2016-07-06 NOTE — Assessment & Plan Note (Signed)
Fibromyalgia currently maintained on gabapentin for neuropathic pain. Does not exercise on a regular basis. Continue current dosage of gabapentin for neuropathic pain that may be related to B12 deficiency or possible diabetic neuropathy. Consider addition of muscle relaxer or SNR eye. Encouraged physical activity to help reduce pain and discussed that narcotics do not generally help reducing pain with increased risk for side effects. Continue to monitor and follow-up in one month or sooner if needed.

## 2016-07-06 NOTE — Assessment & Plan Note (Signed)
Uncontrolled type 2 diabetes with current regimen with home blood sugars being in the 300s. Denies adverse side effects but does continue to have neuropathic related pain most likely related to uncontrolled blood sugars and fibromyalgia. Increase Levemir to 70 units. Continue current dosage of NovoLog, glimepiride, Victoza, and Invokana. Pneumovax is up-to-date. Diabetic foot exam completed today with significant decreased sensation. Encouraged daily foot checks. Due for diabetic eye exam encouraged to be completed independently. Refer to diabetic education. Monitor blood sugars at home 3 times per day. Follow-up in one month or sooner if needed. Referral placed to endocrinology for assistance with management.

## 2016-07-07 ENCOUNTER — Telehealth: Payer: Self-pay | Admitting: Internal Medicine

## 2016-07-07 NOTE — Telephone Encounter (Signed)
Spoke with pt. States that he gets his BiPAP and oxygen through [place]. His insurance is no longer in network with [facility]. He will need to be switched to [facility]. Per the pt, [facility] is not managing his BiPAP and oxygen, his PCP his. Advised pt that he would need to contact his PCP to have this taken care of. He verbalized understanding. Nothing further was needed.

## 2016-07-08 ENCOUNTER — Encounter (HOSPITAL_COMMUNITY)
Admission: RE | Disposition: A | Discharge status: Home / Self Care | Payer: Self-pay | Source: Ambulatory Visit | Attending: Internal Medicine

## 2016-07-08 ENCOUNTER — Ambulatory Visit (HOSPITAL_COMMUNITY): Payer: Medicare HMO | Admitting: Anesthesiology

## 2016-07-08 ENCOUNTER — Encounter (HOSPITAL_COMMUNITY): Payer: Self-pay | Admitting: Internal Medicine

## 2016-07-08 ENCOUNTER — Telehealth: Payer: Self-pay

## 2016-07-08 ENCOUNTER — Ambulatory Visit (HOSPITAL_COMMUNITY)
Admission: RE | Admit: 2016-07-08 | Discharge: 2016-07-08 | Disposition: A | Discharge status: Home / Self Care | Payer: Medicare HMO | Source: Ambulatory Visit | Attending: Internal Medicine | Admitting: Internal Medicine

## 2016-07-08 DIAGNOSIS — K5520 Angiodysplasia of colon without hemorrhage: Secondary | ICD-10-CM | POA: Insufficient documentation

## 2016-07-08 DIAGNOSIS — F329 Major depressive disorder, single episode, unspecified: Secondary | ICD-10-CM | POA: Diagnosis not present

## 2016-07-08 DIAGNOSIS — Z6841 Body Mass Index (BMI) 40.0 and over, adult: Secondary | ICD-10-CM | POA: Insufficient documentation

## 2016-07-08 DIAGNOSIS — I110 Hypertensive heart disease with heart failure: Secondary | ICD-10-CM | POA: Insufficient documentation

## 2016-07-08 DIAGNOSIS — Z9981 Dependence on supplemental oxygen: Secondary | ICD-10-CM | POA: Insufficient documentation

## 2016-07-08 DIAGNOSIS — Z79899 Other long term (current) drug therapy: Secondary | ICD-10-CM | POA: Diagnosis not present

## 2016-07-08 DIAGNOSIS — E1310 Other specified diabetes mellitus with ketoacidosis without coma: Secondary | ICD-10-CM

## 2016-07-08 DIAGNOSIS — J45909 Unspecified asthma, uncomplicated: Secondary | ICD-10-CM | POA: Diagnosis not present

## 2016-07-08 DIAGNOSIS — K7460 Unspecified cirrhosis of liver: Secondary | ICD-10-CM | POA: Insufficient documentation

## 2016-07-08 DIAGNOSIS — Z7902 Long term (current) use of antithrombotics/antiplatelets: Secondary | ICD-10-CM | POA: Insufficient documentation

## 2016-07-08 DIAGNOSIS — I509 Heart failure, unspecified: Secondary | ICD-10-CM | POA: Diagnosis not present

## 2016-07-08 DIAGNOSIS — Z79891 Long term (current) use of opiate analgesic: Secondary | ICD-10-CM | POA: Insufficient documentation

## 2016-07-08 DIAGNOSIS — R195 Other fecal abnormalities: Secondary | ICD-10-CM | POA: Diagnosis not present

## 2016-07-08 DIAGNOSIS — I252 Old myocardial infarction: Secondary | ICD-10-CM | POA: Insufficient documentation

## 2016-07-08 DIAGNOSIS — K625 Hemorrhage of anus and rectum: Secondary | ICD-10-CM

## 2016-07-08 DIAGNOSIS — G4733 Obstructive sleep apnea (adult) (pediatric): Secondary | ICD-10-CM | POA: Insufficient documentation

## 2016-07-08 DIAGNOSIS — K219 Gastro-esophageal reflux disease without esophagitis: Secondary | ICD-10-CM | POA: Insufficient documentation

## 2016-07-08 DIAGNOSIS — F0390 Unspecified dementia without behavioral disturbance: Secondary | ICD-10-CM | POA: Insufficient documentation

## 2016-07-08 DIAGNOSIS — F419 Anxiety disorder, unspecified: Secondary | ICD-10-CM | POA: Insufficient documentation

## 2016-07-08 DIAGNOSIS — M797 Fibromyalgia: Secondary | ICD-10-CM | POA: Insufficient documentation

## 2016-07-08 DIAGNOSIS — Z8673 Personal history of transient ischemic attack (TIA), and cerebral infarction without residual deficits: Secondary | ICD-10-CM | POA: Insufficient documentation

## 2016-07-08 DIAGNOSIS — E785 Hyperlipidemia, unspecified: Secondary | ICD-10-CM | POA: Insufficient documentation

## 2016-07-08 DIAGNOSIS — Z87891 Personal history of nicotine dependence: Secondary | ICD-10-CM | POA: Diagnosis not present

## 2016-07-08 DIAGNOSIS — R1310 Dysphagia, unspecified: Secondary | ICD-10-CM

## 2016-07-08 DIAGNOSIS — G8929 Other chronic pain: Secondary | ICD-10-CM | POA: Insufficient documentation

## 2016-07-08 DIAGNOSIS — Z7951 Long term (current) use of inhaled steroids: Secondary | ICD-10-CM | POA: Insufficient documentation

## 2016-07-08 DIAGNOSIS — Z794 Long term (current) use of insulin: Secondary | ICD-10-CM | POA: Diagnosis not present

## 2016-07-08 DIAGNOSIS — M1990 Unspecified osteoarthritis, unspecified site: Secondary | ICD-10-CM | POA: Insufficient documentation

## 2016-07-08 DIAGNOSIS — E119 Type 2 diabetes mellitus without complications: Secondary | ICD-10-CM | POA: Diagnosis not present

## 2016-07-08 DIAGNOSIS — R140 Abdominal distension (gaseous): Secondary | ICD-10-CM

## 2016-07-08 DIAGNOSIS — K529 Noninfective gastroenteritis and colitis, unspecified: Secondary | ICD-10-CM | POA: Diagnosis present

## 2016-07-08 HISTORY — PX: COLONOSCOPY WITH PROPOFOL: SHX5780

## 2016-07-08 LAB — GLUCOSE, CAPILLARY: GLUCOSE-CAPILLARY: 285 mg/dL — AB (ref 65–99)

## 2016-07-08 SURGERY — COLONOSCOPY WITH PROPOFOL
Anesthesia: Monitor Anesthesia Care

## 2016-07-08 MED ORDER — METRONIDAZOLE 500 MG PO TABS: 500.0000 mg | ORAL_TABLET | Freq: Three times a day (TID) | ORAL | 0 refills | Status: AC

## 2016-07-08 MED ORDER — CLOPIDOGREL BISULFATE 75 MG PO TABS: 75.0000 mg | ORAL_TABLET | Freq: Every day | ORAL | Status: DC

## 2016-07-08 MED ORDER — LACTATED RINGERS IV SOLN
INTRAVENOUS | Status: DC
  Administered 2016-07-08: 09:00:00 via INTRAVENOUS

## 2016-07-08 MED ORDER — PROPOFOL 10 MG/ML IV BOLUS
INTRAVENOUS | Status: AC
  Filled 2016-07-08: qty 40

## 2016-07-08 MED ORDER — PROPOFOL 10 MG/ML IV BOLUS
INTRAVENOUS | Status: DC | PRN
  Administered 2016-07-08: 20 mg via INTRAVENOUS
  Administered 2016-07-08: 40 mg via INTRAVENOUS
  Administered 2016-07-08 (×11): 20 mg via INTRAVENOUS

## 2016-07-08 MED ORDER — SODIUM CHLORIDE 0.9 % IV SOLN: INTRAVENOUS | Status: DC

## 2016-07-08 SURGICAL SUPPLY — 21 items

## 2016-07-08 NOTE — H&P (Signed)
[facility] History and Physical   Primary Care Physician:  Harvie Junior, MD   Reason for Procedure:   heme + stool  Plan:    Colonoscopy      HPI: Bryan Becker is a 56 y.o. male seen in early Jan - Hx w/o change except as noted.  HISTORY OF PRESENT ILLNESS:  This is a 68 or old male who is known to [name]. He presents to our office today primarily for Hemoccult-positive stools that were performed by his PCP. He was Hemoccult positive on all 3 Hemoccult cards that were performed in December. 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 Biopsies of the area showed focal active colitis. Other biopsies of the terminal ileum were normal. This was thought to possibly be due to NSAIDs, but the patient denies that he was using NSAIDs at that time. He reports that on occasion he does sometimes see some bright red blood when he wipes. Denies seeing any black stools.  While he is here he also reports several  including low back pain, bloating and excessive gas, foul-smelling stools, and loose stools.  All of these issues are ongoing for several years.  His blood sugars are very poorly controlled. The other day he had a blood sugar of 700. He says that they usually run between 200 and 300 all the time.  Still has not seen an endocrinologist.  He is on Plavix for past history of CVA.  Is now on oxygen at nighttime.    As we were ending his visit he asked what kind of physician he would see for problems swallowing. He reports that over the past month he's had a couple of episodes of difficulty swallowing. Says that it usually occurs when he is eating cornbread. Does not occur every time he eats and is not occurring on a daily basis.  Does not choke.  Food passes easily with drinking liquids.  Does not recall ever having any issues with swallowing in the past.  Ba swallow showed laryngeal  penetration 07/08/2016   Past Medical History:  Diagnosis Date  . Anxiety   . Arthritis    "joints"  . Asthma   . CHF (congestive heart failure) ([place])   . Chronic back pain    "mostly lower; have 2 bulging discs in my neck" (12/05/2013)  . Chronic bronchitis ([place])    "get it q yr"  . 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 Confusion   . COPD (chronic obstructive pulmonary disease) ([place])   . Dementia    onset of early  . Dementia   . Depression   . Fall at home 06/04/14   Pt stated he falls off the bed at nightly  . Family history of adverse reaction to anesthesia    Patients mother had a bad reaction to Anesthesia however pt is unaware of reaction  . Fibromyalgia   . Frequent urination   . Gastric ulcer   . GERD (gastroesophageal reflux disease)   . ERXVQMGQ(676.1)    "weekly" (12/04/2013)  . Hyperlipidemia   . Hypertension   . Kidney stone   . Myocardial infarction    unsure about MI history, but had normal cath that admission '07 ([facility])  . Nausea & vomiting   . OSA (obstructive sleep apnea) 04/24/2013   "use BiPAP"  . Pancreatitis   .  Pneumonia    "several times"  . [facility] spotted fever) 11/22/2012  . Seasonal allergies   . Shortness of breath dyspnea    with ambulation  . Stroke [facility]) 03/2013   "still weak on the left side" (12/05/2013)  . Stroke [facility]) 12/04/2013   "came in today w/right sided weakness" (12/04/2013)  . Type II diabetes mellitus ([place])     Past Surgical History:  Procedure Laterality Date  . CARDIAC CATHETERIZATION     10/28/05: right dominant, normal LV systolic function, normal coronaries. (Dr. Baxter Hire, [facility])  . CHOLECYSTECTOMY    . COLONOSCOPY    . CYSTOSCOPY W/ STONE MANIPULATION    . LUMBAR LAMINECTOMY/DECOMPRESSION MICRODISCECTOMY Left 06/18/2014   Procedure: LUMBAR LAMINECTOMY/DECOMPRESSION MICRODISCECTOMY LEFT LUMBAR FIVE-SACRAL ONE;  Surgeon: Ashok Pall, MD;  Location: [place] [facility];  Service:  Neurosurgery;  Laterality: Left;  left  . multiple cysts removal-hip,wrist    . TONSILLECTOMY  ~ 1985  . UPPER GASTROINTESTINAL ENDOSCOPY      Prior to Admission medications   Medication Sig Start Date End Date Taking? Authorizing Provider  albuterol (ACCUNEB) 1.25 MG/3ML nebulizer solution Take 1 ampule by nebulization every 6 (six) hours as needed for wheezing.   Yes Historical Provider, MD  bisoprolol (ZEBETA) 5 MG tablet One twice daily Patient taking differently: Take 5 mg by mouth 2 (two) times daily.  05/26/16  Yes Tanda Rockers, MD  canagliflozin (INVOKANA) 100 MG TABS tablet Take 100 mg by mouth.   Yes Historical Provider, MD  citalopram (CELEXA) 40 MG tablet Take 40 mg by mouth daily.     Yes Historical Provider, MD  clopidogrel (PLAVIX) 75 MG tablet Take 75 mg by mouth daily.   Yes Historical Provider, MD  cyclobenzaprine (FLEXERIL) 10 MG tablet Take 1 tablet (10 mg total) by mouth 3 (three) times daily as needed for muscle spasms. 06/18/14  Yes Ashok Pall, MD  docusate sodium (COLACE) 100 MG capsule Take 100 mg by mouth daily as needed for mild constipation.   Yes Historical Provider, MD  fenofibrate 160 MG tablet Take 160 mg by mouth daily. 06/22/16  Yes Historical Provider, MD  fexofenadine (ALLEGRA) 180 MG tablet Take 180 mg by mouth daily.   Yes Historical Provider, MD  finasteride (PROSCAR) 5 MG tablet Take 5 mg by mouth daily.   Yes Historical Provider, MD  fluticasone (FLONASE) 50 MCG/ACT nasal spray Place 2 sprays into both nostrils daily as needed for allergies.    Yes Historical Provider, MD  furosemide (LASIX) 40 MG tablet TAKE 1 TO 2 TABLETS TWICE DAILY AS DIRECTED Patient taking differently: take 51ms twice daily 05/25/16  Yes Mihai Croitoru, MD  gabapentin (NEURONTIN) 600 MG tablet Take 1,200 mg by mouth 3 (three) times daily.   Yes Historical Provider, MD  gemfibrozil (LOPID) 600 MG tablet Take 600 mg by mouth daily.   Yes Historical Provider, MD  glimepiride (AMARYL)  2 MG tablet Take 2 mg by mouth daily. 05/19/16  Yes Historical Provider, MD  Glycopyrrolate-Formoterol (BEVESPI AEROSPHERE) 9-4.8 MCG/ACT AERO Inhale 2 puffs into the lungs 2 (two) times daily. 03/23/16  Yes Tammy S Parrett, NP  HYDROcodone-acetaminophen (NORCO) 10-325 MG tablet Take 1 tablet by mouth every 6 (six) hours as needed for moderate pain or severe pain.  02/04/16  Yes Historical Provider, MD  Insulin Aspart (NOVOLOG FLEXPEN [place]) Inject 20-30 Units into the skin 4 (four) times daily.    Yes Historical Provider, MD  insulin detemir (LEVEMIR) 100 UNIT/ML  injection Inject 60 Units into the skin at bedtime.    Yes Historical Provider, MD  irbesartan (AVAPRO) 300 MG tablet Take 1 tablet (300 mg total) by mouth daily. 04/22/16  Yes Tanda Rockers, MD  L-Lysine 1000 MG TABS Take 1,000 mg by mouth daily as needed (for fever blisters).   Yes Historical Provider, MD  Liraglutide 18 MG/3ML SOPN Inject 1.8 mg into the skin daily.   Yes Historical Provider, MD  LORazepam (ATIVAN) 1 MG tablet Take 1 mg by mouth every 8 (eight) hours as needed for anxiety.    Yes Historical Provider, MD  meloxicam (MOBIC) 7.5 MG tablet Take 7.5 mg by mouth daily. 04/22/16  Yes Historical Provider, MD  memantine (NAMENDA) 10 MG tablet Take 10 mg by mouth 2 (two) times daily. 06/22/16  Yes Historical Provider, MD  omeprazole (PRILOSEC) 40 MG capsule Take 40 mg by mouth daily. 06/22/16  Yes Historical Provider, MD  ondansetron (ZOFRAN) 4 MG tablet Take 1 tablet (4 mg total) by mouth every 8 (eight) hours as needed for nausea or vomiting. 12/05/13  Yes Geradine Girt, DO  Potassium Gluconate 595 MG CAPS Take 2 capsules by mouth daily.   Yes Historical Provider, MD  prochlorperazine (COMPAZINE) 25 MG suppository Place 1 suppository (25 mg total) rectally every 12 (twelve) hours as needed for nausea or vomiting. 01/10/14  Yes Gatha Mayer, MD  promethazine (PHENERGAN) 25 MG tablet Take 1 tablet (25 mg total) by mouth every 6 (six)  hours as needed for nausea or vomiting. 02/11/14  Yes Delice Bison Ward, DO  Specialty Vitamins Products (MAGNESIUM, AMINO ACID CHELATE,) 133 MG tablet Take 1 tablet by mouth 2 (two) times daily.   Yes Historical Provider, MD  sucralfate (CARAFATE) 1 g tablet Take 1 g by mouth 4 (four) times daily -  with meals and at bedtime.   Yes Historical Provider, MD  tamsulosin (FLOMAX) 0.4 MG CAPS capsule Take 0.4 mg by mouth daily. 04/25/16  Yes Historical Provider, MD  TOVIAZ 4 MG TB24 tablet Take 4 mg by mouth daily. 06/22/16  Yes Historical Provider, MD  traMADol (ULTRAM) 50 MG tablet Take 50 mg by mouth every 8 (eight) hours as needed for moderate pain.    Yes Historical Provider, MD  umeclidinium-vilanterol (ANORO ELLIPTA) 62.5-25 MCG/INH AEPB Inhale 1 puff into the lungs daily. 05/24/16  Yes Tanda Rockers, MD  amoxicillin (AMOXIL) 500 MG capsule Take 2 capsules (1,000 mg total) by mouth 2 (two) times daily. 06/28/16   Alfonzo Beers, MD  Cholecalciferol (VITAMIN D3) 50000 units CAPS Take 50,000 Units by mouth once a week. 07/06/16   [place] Circle, FNP  OXYGEN 2lpm with sleep and occ as needed during the day Talmo Provider, MD  rifaximin (XIFAXAN) 550 MG TABS tablet Take 1 tablet (550 mg total) by mouth 3 (three) times daily. 05/24/16   Jessica D Zehr, PA-C  VENTOLIN HFA 108 (90 Base) MCG/ACT inhaler INHALE 2 PUFFS EVERY 6 HOURS AS NEEDED 06/17/16   Melvenia Needles, NP    Current Facility-Administered Medications  Medication Dose Route Frequency Provider Last Rate Last Dose  . 0.9 %  sodium chloride infusion   Intravenous Continuous Jessica D Zehr, PA-C      . lactated ringers infusion   Intravenous Continuous Gatha Mayer, MD        Allergies as of 05/24/2016 - Review Complete 05/24/2016  Allergen Reaction Noted  . Actos [pioglitazone] Shortness Of Breath  03/04/2014  . Aricept [donepezil hcl] Diarrhea 01/21/2014  . Codeine Other (See Comments) 04/21/2011    Family History  Problem  Relation Age of Onset  . Skin cancer Father   . Heart attack Father   . Stomach cancer Father   . Hypertension Father   . Aneurysm Father   . Lung cancer Paternal Grandfather   . Heart disease Paternal Grandfather   . Stomach cancer Maternal Grandfather   . Bone cancer Paternal Grandmother   . Heart attack Paternal Grandmother   . Diabetes Mother   . Hypertension Mother   . Arthritis Mother   . Other Mother     brain stem vertigo/disorder of pancreas  . Diabetes Maternal Grandmother   . Heart disease Maternal Grandmother   . Aneurysm Maternal Uncle   . Aneurysm Maternal Uncle   . Colon cancer Neg Hx   . Esophageal cancer Neg Hx   . Rectal cancer Neg Hx     Social History   Social History  . Marital status: Married    Spouse name: N/A  . Number of children: 2  . Years of education: 16   Occupational History  . [employer]   Social History Main Topics  . Smoking status: Former Smoker    Packs/day: 2.00    Years: 21.00    Types: Cigarettes    Quit date: 05/23/2002  . Smokeless tobacco: Never Used  . Alcohol use No  . Drug use: No  . Sexual activity: Not Currently   Other Topics Concern  . Not on file   Social History Narrative  . No narrative on file    Review of Systems:  All other review of systems negative except as mentioned in the HPI.  Physical Exam: Vital signs in last 24 hours:     General:   Alert,  Well-developed, well-nourished, pleasant and cooperative in NAD Lungs:  Clear throughout to auscultation.   Heart:  Regular rate and rhythm; no murmurs, clicks, rubs,  or gallops. Abdomen:  Soft, nontender and nondistended. Normal bowel sounds.   Neuro/Psych:  Alert and cooperative. Normal mood and affect. A and O x 3   @Arietta Eisenstein  Simonne Maffucci, MD, [facility] [phone] (pager) 07/08/2016 8:44 AM@

## 2016-07-08 NOTE — Telephone Encounter (Signed)
-----   Message from Gatha Mayer, MD sent at 07/08/2016  9:54 AM EST ----- Regarding: please cc Print and fax colonoscopy to pcp

## 2016-07-08 NOTE — Transfer of Care (Signed)
Immediate Anesthesia Transfer of Care Note  Patient: Bryan Becker  Procedure(s) Performed: Procedure(s): COLONOSCOPY WITH PROPOFOL (N/A)  Patient Location: PACU  Anesthesia Type:MAC  Level of Consciousness: sedated  Airway & Oxygen Therapy: Patient Spontanous Breathing and Patient connected to nasal cannula oxygen  Post-op Assessment: Report given to RN and Post -op Vital signs reviewed and stable  Post vital signs: Reviewed and stable  Last Vitals:  Vitals:   07/08/16 0850 07/08/16 0934  BP: (!) 186/88   Pulse: 87 87  Resp: 14 (!) 21  Temp: 36.8 C     Last Pain:  Vitals:   07/08/16 0850  TempSrc: Oral         Complications: No apparent anesthesia complications

## 2016-07-08 NOTE — Telephone Encounter (Signed)
Faxed colonoscopy report to his PCP- Dr Ardelia Mems. Williams at fax # [phone] for his records.

## 2016-07-08 NOTE — Anesthesia Preprocedure Evaluation (Signed)
Anesthesia Evaluation  Patient identified by MRN, date of birth, ID band Patient awake    Reviewed: Allergy & Precautions, NPO status , Patient's Chart, lab work & pertinent test results, reviewed documented beta blocker date and time   History of Anesthesia Complications (+) Family history of anesthesia reaction and history of anesthetic complications  Airway Mallampati: II  TM Distance: >3 FB Neck ROM: Full    Dental  (+) Teeth Intact, Dental Advisory Given   Pulmonary shortness of breath and with exertion, asthma , sleep apnea and Continuous Positive Airway Pressure Ventilation , COPD,  COPD inhaler and oxygen dependent, former smoker,    Pulmonary exam normal breath sounds clear to auscultation       Cardiovascular hypertension, Pt. on home beta blockers and Pt. on medications + Past MI and +CHF  Normal cardiovascular exam Rhythm:Regular Rate:Normal  Echo 11/17: Study Conclusions  - Left ventricle: The cavity size was normal. Wall thickness was increased in a pattern of mild LVH. Systolic function was normal. The estimated ejection fraction was in the range of 60% to 65%. Wall motion was normal; there were no regional wall motion abnormalities. - Aorta: Ascending aortic diameter: 38 mm (S). - Ascending aorta: The ascending aorta was mildly dilated.  Impressions:  - Compared to the prior study, there has been no significant interval change.   Neuro/Psych  Headaches, PSYCHIATRIC DISORDERS Anxiety Depression Dementia  CVA, Residual Symptoms    GI/Hepatic PUD, GERD  Medicated,(+) Cirrhosis       , Hepatitis -  Endo/Other  diabetes, Type 2, Oral Hypoglycemic Agents, Insulin DependentMorbid obesity  Renal/GU negative Renal ROS     Musculoskeletal  (+) Arthritis , Fibromyalgia -  Abdominal   Peds  Hematology  (+) Blood dyscrasia (Plavix therapy), ,   Anesthesia Other Findings Day of surgery medications  reviewed with the patient.  Reproductive/Obstetrics                             Anesthesia Physical Anesthesia Plan  ASA: III  Anesthesia Plan: MAC   Post-op Pain Management:    Induction: Intravenous  Airway Management Planned: Nasal Cannula  Additional Equipment:   Intra-op Plan:   Post-operative Plan:   Informed Consent: I have reviewed the patients History and Physical, chart, labs and discussed the procedure including the risks, benefits and alternatives for the proposed anesthesia with the patient or authorized representative who has indicated his/her understanding and acceptance.   Dental advisory given  Plan Discussed with: CRNA and Anesthesiologist  Anesthesia Plan Comments: (Discussed risks/benefits/alternatives to MAC sedation including need for ventilatory support, hypotension, need for conversion to general anesthesia.  All patient questions answered.  Patient/guardian wishes to proceed.)        Anesthesia Quick Evaluation

## 2016-07-08 NOTE — Anesthesia Postprocedure Evaluation (Signed)
Anesthesia Post Note  Patient: BEREN YNIGUEZ  Procedure(s) Performed: Procedure(s) (LRB): COLONOSCOPY WITH PROPOFOL (N/A)  Patient location during evaluation: Endoscopy Anesthesia Type: MAC Level of consciousness: awake and alert Pain management: pain level controlled Vital Signs Assessment: post-procedure vital signs reviewed and stable Respiratory status: spontaneous breathing, nonlabored ventilation, respiratory function stable and patient connected to nasal cannula oxygen Cardiovascular status: stable and blood pressure returned to baseline Anesthetic complications: no       Last Vitals:  Vitals:   07/08/16 1005 07/08/16 1010  BP:  (!) 156/90  Pulse: 79 80  Resp: 16 13  Temp:      Last Pain:  Vitals:   07/08/16 0934  TempSrc: Oral                 Catalina Gravel

## 2016-07-08 NOTE — Discharge Instructions (Signed)
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 I cauterized it and placed a clip on it to stop it from bleeding anymore. Stay off Plavix until Monday.  Bryan Becker had tried to prescribe an antibiotic to help with the bloating and gas but it was not approved. I am sending in a different rx to see if that helps.  I appreciate the opportunity to care for you. Gatha Mayer, MD, Marval Regal

## 2016-07-08 NOTE — Op Note (Signed)
[facility] Patient Name: Bryan Becker Procedure Date: 07/08/2016 MRN: [id number] Attending MD: Gatha Mayer , MD Date of Birth: [birth year] CSN: [id number] Age: 56 Admit Type: Outpatient Procedure:                Colonoscopy Indications:              Heme positive stool Providers:                Gatha Mayer, MD, Carmie End, RN, Corliss Parish, Technician Referring MD:             Ardelia Mems. Williams Medicines:                Propofol per Anesthesia, Monitored Anesthesia Care Complications:            No immediate complications. Estimated Blood Loss:     Estimated blood loss was minimal. Procedure:                Pre-Anesthesia Assessment:                           - Prior to the procedure, a History and Physical                            was performed, and patient medications and                            allergies were reviewed. The patient's tolerance of                            previous anesthesia was also reviewed. The risks                            and benefits of the procedure and the sedation                            options and risks were discussed with the patient.                            All questions were answered, and informed consent                            was obtained. Prior Anticoagulants: The patient                            last took Plavix (clopidogrel) 1 day prior to the                            procedure. ASA Grade Assessment: III - A patient                            with severe systemic disease. After reviewing the  risks and benefits, the patient was deemed in                            satisfactory condition to undergo the procedure.                           After obtaining informed consent, the colonoscope                            was passed under direct vision. Throughout the                            procedure, the patient's blood pressure, pulse, and                           oxygen saturations were monitored continuously. The                            EC-[id number] ([id number]) scope was introduced through                            the anus and advanced to the the cecum, identified                            by appendiceal orifice and ileocecal valve. The                            colonoscopy was performed without difficulty. The                            patient tolerated the procedure well. The quality                            of the bowel preparation was fair. The bowel                            preparation used was Miralax. The ileocecal valve,                            appendiceal orifice, and rectum were photographed. Scope In: 9:15:32 AM Scope Out: 9:29:22 AM Scope Withdrawal Time: 0 hours 8 minutes 32 seconds  Total Procedure Duration: 0 hours 13 minutes 50 seconds  Findings:      The perianal and digital rectal examinations were normal.      A single small angiodysplastic lesion with bleeding on contact was found       in the ascending colon. Coagulation for bleeding prevention using argon       plasma was successful. Estimated blood loss was minimal. To prevent       bleeding post-intervention, one hemostatic clip was successfully placed       (MR conditional). There was no bleeding at the end of the procedure.      The exam was otherwise without abnormality on direct and retroflexion       views. Impression:               -  Preparation of the colon was fair.                           - A single colonic angiodysplastic lesion. Treated                            with argon plasma coagulation (APC). Clip (MR                            conditional) was placed.                           - The examination was otherwise normal on direct                            and retroflexion views.                           - No specimens collected. Moderate Sedation:      N/A- Per Anesthesia Care Recommendation:           - Patient has a  contact number available for                            emergencies. The signs and symptoms of potential                            delayed complications were discussed with the                            patient. Return to normal activities tomorrow.                            Written discharge instructions were provided to the                            patient.                           - Resume previous diet.                           - Continue present medications.                           - Resume Plavix (clopidogrel) at prior dose in 3                            days.                           - No recommendation at this time regarding repeat                            colonoscopy. Has 2024 recall from 2014 colonoscopy  will keep that however co-morbidities will probably                            preclude re-exam then.                           - metronidazole 500 mg tid x 7 d for gas/bloating ?                            SIBO                           - Return to my office PRN. Procedure Code(s):        --- Professional ---                           [phone], Colonoscopy, flexible; with control of                            bleeding, any method Diagnosis Code(s):        --- Professional ---                           K55.20, Angiodysplasia of colon without hemorrhage                           R19.5, Other fecal abnormalities CPT copyright 2016 American Medical Association. All rights reserved. The codes documented in this report are preliminary and upon coder review may  be revised to meet current compliance requirements. Gatha Mayer, MD 07/08/2016 9:54:29 AM This report has been signed electronically. Number of Addenda: 0

## 2016-07-11 ENCOUNTER — Encounter (HOSPITAL_COMMUNITY): Payer: Self-pay | Admitting: Internal Medicine

## 2016-07-13 ENCOUNTER — Telehealth: Payer: Self-pay | Admitting: Internal Medicine

## 2016-07-13 DIAGNOSIS — J9611 Chronic respiratory failure with hypoxia: Secondary | ICD-10-CM

## 2016-07-13 DIAGNOSIS — R0600 Dyspnea, unspecified: Secondary | ICD-10-CM

## 2016-07-13 DIAGNOSIS — G4733 Obstructive sleep apnea (adult) (pediatric): Secondary | ICD-10-CM

## 2016-07-13 NOTE — Telephone Encounter (Signed)
Melissa called and stated that the pt is changing from [facility] to [facility] due to insurance coverage.  Order has been placed

## 2016-07-13 NOTE — Telephone Encounter (Signed)
lmtcb x1 for pt to make this appointment.

## 2016-07-15 NOTE — Telephone Encounter (Signed)
ono on bipap but I talked to pt and warned him [facility] may not accept this s bipap data which we don't have here

## 2016-07-15 NOTE — Telephone Encounter (Signed)
I don't see any documentation with our group re 02 or bipap so unless he's had a recent study would first need to do sleep study/ titration then ono on that setting if it doesn't fix the problem  I don't do sleep medicine so best to go ahead and refer to one of ours unless he's established with one elsewhere in which case they can do the above

## 2016-07-15 NOTE — Telephone Encounter (Signed)
Spoke with pt, and made him aware of MW's recommendations. First available with a sleep doc is 08/30/16. Pt currently wears bipap qhs with 2L O2 bled in. Pt is wanting to switch [facility] to [facility]. Pt will need a ONO & OV in order to switch. Pt hasdsleep study on 01-11-16.  MW please advise if we can schedule pt with you for ONO and OV, as sleep consult is so far out.  *sleep consult will need to be scheduled after contacting pt with MW's response.

## 2016-07-15 NOTE — Telephone Encounter (Signed)
Patient returning call - he can be reached at [phone] -pr

## 2016-07-15 NOTE — Telephone Encounter (Signed)
Order sent to [facility] 

## 2016-07-15 NOTE — Telephone Encounter (Signed)
lmtcb X1 for pt  

## 2016-07-15 NOTE — Telephone Encounter (Signed)
Called and spoke with pt and he stated that the walk test he has done in the past and he stated that he never passes this.  He stated that he normally only uses the oxygen at night with his bipap.  MW please advise on what test we will need to order for pt to change to [facility].

## 2016-07-19 ENCOUNTER — Telehealth: Payer: Self-pay | Admitting: Internal Medicine

## 2016-07-19 NOTE — Telephone Encounter (Signed)
Attempted to contact Slick at [facility]. No answer and I could not leave a message. Will try back.

## 2016-07-19 NOTE — Telephone Encounter (Signed)
OV along with titration study and order have to be within 30 days. Patient's insurance will not recognize an ONO to qualify for O2.

## 2016-07-21 ENCOUNTER — Ambulatory Visit: Payer: Medicare HMO

## 2016-07-21 ENCOUNTER — Ambulatory Visit (INDEPENDENT_AMBULATORY_CARE_PROVIDER_SITE_OTHER): Payer: Medicare HMO | Admitting: General Practice

## 2016-07-21 ENCOUNTER — Ambulatory Visit (INDEPENDENT_AMBULATORY_CARE_PROVIDER_SITE_OTHER): Payer: Medicare HMO | Admitting: Internal Medicine

## 2016-07-21 ENCOUNTER — Encounter: Payer: Self-pay | Admitting: Internal Medicine

## 2016-07-21 VITALS — BP 128/70 | HR 92 | Ht 69.0 in | Wt 285.0 lb

## 2016-07-21 DIAGNOSIS — E538 Deficiency of other specified B group vitamins: Secondary | ICD-10-CM | POA: Diagnosis not present

## 2016-07-21 DIAGNOSIS — J449 Chronic obstructive pulmonary disease, unspecified: Secondary | ICD-10-CM | POA: Diagnosis not present

## 2016-07-21 DIAGNOSIS — J9611 Chronic respiratory failure with hypoxia: Secondary | ICD-10-CM | POA: Diagnosis not present

## 2016-07-21 DIAGNOSIS — G4733 Obstructive sleep apnea (adult) (pediatric): Secondary | ICD-10-CM | POA: Diagnosis not present

## 2016-07-21 MED ORDER — CYANOCOBALAMIN 1000 MCG/ML IJ SOLN
1000.0000 ug | Freq: Once | INTRAMUSCULAR | Status: AC
  Administered 2016-07-21: 1000 ug via INTRAMUSCULAR

## 2016-07-21 NOTE — Telephone Encounter (Signed)
Pt seen and did not qualify for daytime o2 based on walk  We have referred to sleep doc and also ordered the CPAP to BIPAP titration study

## 2016-07-21 NOTE — Telephone Encounter (Signed)
Spoke with Bryan Becker, pt needs ambulatory oximetry for exertional oxygen.  If patient does not desat will need BiPAP Titration study with O2 if needed. Bryan Becker states that [facility] will not qualify a patient for night time O2 based on an ONO if the patient uses BiPAP.  If patient qualifies for daytime O2 they will automatically qualify him for night time.  Needs OV within 30 days of having this done to discuss results.   After appropriate testing>>Final order will need to be written as Nocturnal and Exertional O2 at ??/Liter to be bled into BiPAP at night.   We need to bring patient in for amb ox first to see if qualifies for daytime oxygen and if not qualifies then he will need titration study completed.  ---------------- Called patient and we discussed what needed to be done.  Pt was walked at last OV in 05/2016 and was unable walk so an order was placed for ONO on BiPAP.  These amb ox results are too old and need to be repeated.  Pt did have ONO last night WITHOUT BIPAP, per the order pt was supposed to do this with the BiPAP.   Called [facility], Bryan Becker stated that that this order slipped through and should not have been done because this test is pointless because insurance will not accept this and it was done incorrectly.  ----------------- Left a message for patient to return my call.  Pt needs to come in for amb ox, today if possible.  Advised pt to ask for Leisl Spurrier as I have been working on this today.

## 2016-07-21 NOTE — Assessment & Plan Note (Signed)
Complicated by osa/ djd   Body mass index is 42.09 kg/m.  trending down/ encouraged  Lab Results  Component Value Date   TSH 1.18 03/02/2016     Contributing to gerd risk/ doe/reviewed the need and the process to achieve and maintain neg calorie balance > defer f/u primary care including intermittently monitoring thyroid status

## 2016-07-21 NOTE — Progress Notes (Signed)
Subjective:   Patient ID: Bryan Becker, male    DOB: [birth year]    MRN: [id number]    Brief patient profile:  52 yowm quit smoking 2004 with bad am cough which resolved with doe x 100 ft medications helped some but gradually worse to point where doe room to room so referred to pulmonary clinic 01/04/2016 by Dr York Ram with GOLD III copd criteria established 02/11/16     History of Present Illness  01/04/2016 1st  Pulmonary office visit/ Wert  spiriva in am/ breo in pm  Chief Complaint  Patient presents with  . Advice Only    Referred by Dr. Jimmye Norman for COPD.  Pt is switching care from a pulmonologist in [place].    cough is dry /daytime x years  has prn tank and 02 2.5 lpm plus bipap and sleeps fine  Breathing no better on saba/ nor is cough  - doe best days = MMRC4  = sob if tries to leave home or while getting dressed   rec Stop lisinopril and start ibesartan 300 mg daily - reduce to one half if too strong Plan A = Automatic = spiriva / breo each am  Plan B = Backup Only use your albuterol as a rescue medication Plan C = Crisis - only use your albuterol nebulizer if you first try Plan B   03/02/2016  f/u ov/Wert re: sob x 2012/ cough some better off acei  Chief Complaint  Patient presents with  . Follow-up    Breathing worse since the last visit "feels like I'm smothering".  He states he is already hoarse and has increased cough that is non prod.    confused with when/how to use Plan B and C even when reviewed written instructions from last ov  cough really main issue and  is more day than noct and remains dry/ hacking Doe = MMRC2 = can't walk a nl pace on a flat grade s sob but does fine slow and flat eg  >>change to bevespi    03/23/2016 NP Follow up : COPD  Patient returns for a one-month follow-up and medication review. Patient did not bring his medications in today . Last visit, patient was changed from [place].to [employer].  Feels he likes the  [place] better. Feels he can breath a little better.  Now he is able to sing at church  Cough has resolved off ACE now.  He denies chest pain , orthopnea, edema, fever or hemoptysis .  Does use albuterol most days Twice daily   rec Refer to pulmonary rehab.  Continue on Bevespi Take 2 puffs first thing in am and then another 2 puffs about 12 hours later. , rinse after use.  Use albuterol inhaler As needed  Only -this is your rescue/emergency inhaler.     05/26/2016  f/u ov/Wert re: copd GOLD III quit 2004  Chief Complaint  Patient presents with  . Follow-up    Pt states he is here for SOB that has improved with medication that provided last visit. Pt would like to discuss O2 requirements during day  feeling better with less doe still MMRC2 = can't walk a nl pace on a flat grade s sob but does fine slow and flat eg shopping  Sleeping better on 2lpm hs and does not have portable / does not desaturate typically when we walk him in office  rec Please see patient coordinator before you leave today  to schedule referral to primary care  for hypertension When finish bevespi change to anoro one click each am Increase zebeta (bisoprolol) to 5 mg one twice daily  Goal with exercise is to keep 02 saturation above 90% at all times     07/21/2016  f/u ov/Wert re: copd GOLD III  anoro one click am/ rescue saba hfa once weekly Chief Complaint  Patient presents with  . Follow-up    Pt needing o2 qualification.    bipap per [facility] doctor x 5 years prior to OV  Plus 2lpm but has changed dme companies and needs recert on both  Walking x 15 min on treadmill snails pace 3 week flat       No obvious day to day or daytime variability or assoc excess/ purulent sputum or mucus plugs or hemoptysis or cp or chest tightness, subjective wheeze or overt sinus or hb symptoms. No unusual exp hx or h/o childhood pna/ asthma or knowledge of premature birth.  Sleeping ok on 2lpm @ 30 degrees  without nocturnal  or early  am exacerbation  of respiratory  c/o's or need for noct saba. Also denies any obvious fluctuation of symptoms with weather or environmental changes or other aggravating or alleviating factors except as outlined above   Current Medications, Allergies, Complete Past Medical History, Past Surgical History, Family History, and Social History were reviewed in [employer] record.  ROS  The following are not active complaints unless bolded sore throat, dysphagia, dental problems, itching, sneezing,  nasal congestion or excess/ purulent secretions, ear ache,   fever, chills, sweats, unintended wt loss, classically pleuritic or exertional cp,  orthopnea pnd or leg swelling better presyncope, palpitations, abdominal pain, anorexia, nausea, vomiting, diarrhea  or change in bowel or bladder habits, change in stools or urine, dysuria,hematuria,  rash, arthralgias, visual complaints, headache, numbness, weakness or ataxia or problems with walking or coordination,  change in mood/affect or memory.              Objective:   Physical Exam   amb hoarse  wm nad     07/21/2016        285   05/26/16 294 lb 12.8 oz (133.7 kg)  05/24/16 289 lb (131.1 kg)  04/21/16 286 lb (129.7 kg)       Vital signs reviewed -  - Note on arrival 02 sats  97% on RA  And bp 128/70    HEENT: nl dentition, turbinates, and oropharynx. Nl external ear canals without cough reflex Modified Mallampati Score =   3   NECK :  without JVD/Nodes/TM/ nl carotid upstrokes bilaterally   LUNGS: no acc muscle use,  Minimal barrel shaped chest, distant BS in bases   CV:  RRR  no s3 or murmur or increase in P2, no edema   ABD: tensely obese  soft and nontender with nl inspiratory excursion in the supine position. No bruits or organomegaly, bowel sounds nl  MS:  Nl gait/ ext warm without deformities, calf tenderness, cyanosis or clubbing No obvious joint restrictions   SKIN: warm and dry without lesions     NEURO:  alert, approp, nl sensorium with  no motor deficits      Labs  reviewed:    Chemistry      Component Value Date/Time   NA 140 03/02/2016 1500   K 3.5 03/02/2016 1500   CL 99 03/02/2016 1500   CO2 31 03/02/2016 1500   BUN 13 03/02/2016 1500   CREATININE 0.79 03/02/2016 1500  Component Value Date/Time   CALCIUM 9.7 03/02/2016 1500   ALKPHOS 37 (L) 11/21/2015 2058   AST 19 11/21/2015 2058   ALT 21 11/21/2015 2058   BILITOT 0.7 11/21/2015 2058        Lab Results  Component Value Date   WBC 5.7 03/02/2016   HGB 15.0 03/02/2016   HCT 44.8 03/02/2016   MCV 86.1 03/02/2016   PLT 207.0 03/02/2016         Lab Results  Component Value Date   TSH 1.18 03/02/2016     Lab Results  Component Value Date   PROBNP 11.0 03/02/2016

## 2016-07-21 NOTE — Patient Instructions (Addendum)
Please see patient coordinator before you leave today  to schedule for sleep study  and sleep medicine consultation

## 2016-07-21 NOTE — Telephone Encounter (Signed)
Corene Cornea called back stating that they found that the patient has never had a BiPAP titration study.  The sleep study that was done in 01/10/17 showed the patient qualified and tolerated CPAP 14cm with minimal desat in the low 90's. Pt was not put on BiPAP during that test. [facility] is needing documentation of BIPAP compliance and need for BiPAP through the patient having a CPAP to BiPAP titration study done asap. Pt is coming today to be seen by Dr Melvyn Novas and have another O2 oximetry--pt aware that he needs to try and walk as far as he can given the last amb ox he only walked 1/2 lap and did not qualify for O2 at that time. If patient desats today that will automatically qualify him for O2 at night, if he does not desat then the a CPAP to Bipap titration will need to done to prove O2 need. Pt cannot be qualified via ONO given that he is currently on BiPAP per [facility] and Medicare guidelines. Pt has to have a CPAP to BiPAP titration done regardless to prove which device he need since do not have this documented on his chart and [facility] does not have this documented either. Pt is switching from [place] to [facility] and nothing in the records gives any information as to why he was never placed CPAP after 01/10/17 sleep study and why he continued on BiPAP.   Pt needs CPAP to BiPAP titration ordered today  Will send to [facility] as Juluis Rainier

## 2016-07-21 NOTE — Telephone Encounter (Signed)
Pt called back. Expressed understanding to testing needed.  Appt scheduled for today at 3pm to do the amb oximetry.  Will hold in triage until this is completed and documented.

## 2016-07-24 NOTE — Assessment & Plan Note (Signed)
05/26/2016   Walked RA x one lap @ 185 stopped due to  Sob/light headed  with no desats at slow to moderate pace - 07/21/2016   Walked RA  2 laps @ 185 ft each stopped due to  Sob / weak/hurting both legs hips to ankles but no desat slow pace   So no need for amb 02 but ? Needs for hs > repeat cpap titration first

## 2016-07-24 NOTE — Assessment & Plan Note (Signed)
Trial off acei 01/04/2016 >>>  - PFT's  02/11/16  FEV1 1.72 (47 % ) ratio 55  p 14 % improvement from saba p spiriva prior to study with DLCO  62 % corrects to 117 % for alv volume  And ERV 26  - 03/02/2016  After extensive coaching HFA effectiveness =    75% > try bevespi Take 2 puffs first thing in am and then another 2 puffs about 12 hours later. -refer to pulm rehab 03/23/2016  - Changed to anoro 05/26/2016 due to insurance restriction  Remains Pt is Group B in terms of symptom/risk and laba/lama therefore appropriate rx at this point and well compensated on anoro > no change rx   Each maintenance medication was reviewed in detail including most importantly the difference between maintenance and as needed and under what circumstances the prns are to be used.  Please see AVS for specific  Instructions which are unique to this visit and I personally typed out  which were reviewed in detail in writing with the patient and a copy provided.

## 2016-07-24 NOTE — Assessment & Plan Note (Signed)
cpap titration ordered 07/24/2016 >>>   This will need to be done before we can requalify him for 02

## 2016-07-27 ENCOUNTER — Other Ambulatory Visit: Payer: Self-pay | Admitting: Cardiovascular Disease

## 2016-08-02 ENCOUNTER — Telehealth: Payer: Self-pay | Admitting: Internal Medicine

## 2016-08-02 NOTE — Telephone Encounter (Signed)
lmtcb x1 for pt. 

## 2016-08-03 NOTE — Telephone Encounter (Signed)
Attempted to contact pt. No answer, no option to leave a message. Will try back.  

## 2016-08-03 NOTE — Telephone Encounter (Signed)
lmtcb for pt.  

## 2016-08-03 NOTE — Telephone Encounter (Signed)
Pt returning call again.Bryan Becker ° °

## 2016-08-04 NOTE — Telephone Encounter (Signed)
lmtcb x1 for pt. 

## 2016-08-05 NOTE — Telephone Encounter (Signed)
Called and spoke with pt and he stated that he is scheduled for April for the sleep study.  He has borrowed a cpap and he stated that he does not have the oxygen that he used to have to use at night.  Pt is wanting to know if we can send information over to [facility] to help him get set up with something prior to his sleep study.  MW please advise. thanks

## 2016-08-05 NOTE — Telephone Encounter (Signed)
I am sorry but per the insurance rules for it to be paid for will need sleep titration first - he can always purchase it on his own in interim

## 2016-08-05 NOTE — Telephone Encounter (Signed)
lmtcb X1 for pt to relay recs.

## 2016-08-08 NOTE — Telephone Encounter (Signed)
lmtcb x2 for pt. 

## 2016-08-09 DIAGNOSIS — N3281 Overactive bladder: Secondary | ICD-10-CM | POA: Diagnosis not present

## 2016-08-09 DIAGNOSIS — N3020 Other chronic cystitis without hematuria: Secondary | ICD-10-CM | POA: Diagnosis not present

## 2016-08-09 DIAGNOSIS — R351 Nocturia: Secondary | ICD-10-CM | POA: Diagnosis not present

## 2016-08-09 DIAGNOSIS — N401 Enlarged prostate with lower urinary tract symptoms: Secondary | ICD-10-CM | POA: Diagnosis not present

## 2016-08-09 NOTE — Telephone Encounter (Signed)
Called and spoke with pt and he is aware of MW recs and he stated that the sleep titration is scheduled for April.  Nothing further is needed

## 2016-08-10 ENCOUNTER — Encounter: Payer: Medicare HMO | Admitting: Family

## 2016-08-11 ENCOUNTER — Encounter: Payer: Self-pay | Admitting: Family

## 2016-08-11 ENCOUNTER — Ambulatory Visit (INDEPENDENT_AMBULATORY_CARE_PROVIDER_SITE_OTHER): Payer: Medicare HMO | Admitting: Family

## 2016-08-11 ENCOUNTER — Other Ambulatory Visit (INDEPENDENT_AMBULATORY_CARE_PROVIDER_SITE_OTHER): Payer: Medicare HMO

## 2016-08-11 VITALS — BP 162/88 | HR 82 | Temp 98.3°F | Resp 16 | Ht 69.0 in | Wt 291.0 lb

## 2016-08-11 DIAGNOSIS — E538 Deficiency of other specified B group vitamins: Secondary | ICD-10-CM | POA: Diagnosis not present

## 2016-08-11 DIAGNOSIS — R531 Weakness: Secondary | ICD-10-CM

## 2016-08-11 DIAGNOSIS — R1084 Generalized abdominal pain: Secondary | ICD-10-CM | POA: Diagnosis not present

## 2016-08-11 DIAGNOSIS — E1165 Type 2 diabetes mellitus with hyperglycemia: Secondary | ICD-10-CM

## 2016-08-11 DIAGNOSIS — E1142 Type 2 diabetes mellitus with diabetic polyneuropathy: Secondary | ICD-10-CM

## 2016-08-11 DIAGNOSIS — IMO0002 Reserved for concepts with insufficient information to code with codable children: Secondary | ICD-10-CM

## 2016-08-11 DIAGNOSIS — Z794 Long term (current) use of insulin: Secondary | ICD-10-CM | POA: Diagnosis not present

## 2016-08-11 LAB — COMPREHENSIVE METABOLIC PANEL
ALBUMIN: 4.3 g/dL (ref 3.5–5.2)
ALT: 25 U/L (ref 0–53)
AST: 22 U/L (ref 0–37)
Alkaline Phosphatase: 40 U/L (ref 39–117)
BILIRUBIN TOTAL: 0.3 mg/dL (ref 0.2–1.2)
BUN: 13 mg/dL (ref 6–23)
CALCIUM: 9.4 mg/dL (ref 8.4–10.5)
CO2: 32 meq/L (ref 19–32)
Chloride: 103 mEq/L (ref 96–112)
Creatinine, Ser: 0.75 mg/dL (ref 0.40–1.50)
GFR: 114.05 mL/min (ref >60.00)
Glucose, Bld: 108 mg/dL — ABNORMAL HIGH (ref 70–99)
Potassium: 3.7 mEq/L (ref 3.5–5.1)
SODIUM: 142 meq/L (ref 135–145)
Total Protein: 6.7 g/dL (ref 6.0–8.3)

## 2016-08-11 LAB — CBC WITH DIFFERENTIAL/PLATELET
BASOS ABS: 0 10*3/uL (ref 0.0–0.1)
Basophils Relative: 0.4 % (ref 0.0–3.0)
EOS ABS: 0.1 10*3/uL (ref 0.0–0.7)
Eosinophils Relative: 2.7 % (ref 0.0–5.0)
HEMATOCRIT: 41.1 % (ref 39.0–52.0)
HEMOGLOBIN: 14 g/dL (ref 13.0–17.0)
LYMPHS PCT: 42.8 % (ref 12.0–46.0)
Lymphs Abs: 2 10*3/uL (ref 0.7–4.0)
MCHC: 34 g/dL (ref 30.0–36.0)
MCV: 86.1 fl (ref 78.0–100.0)
MONO ABS: 0.4 10*3/uL (ref 0.1–1.0)
Monocytes Relative: 8.5 % (ref 3.0–12.0)
Neutro Abs: 2.1 10*3/uL (ref 1.4–7.7)
Neutrophils Relative %: 45.6 % (ref 43.0–77.0)
Platelets: 213 10*3/uL (ref 150.0–400.0)
RBC: 4.77 Mil/uL (ref 4.22–5.81)
RDW: 14.7 % (ref 11.5–15.5)
WBC: 4.6 10*3/uL (ref 4.0–10.5)

## 2016-08-11 LAB — VITAMIN B12: VITAMIN B 12: 245 pg/mL (ref 211–911)

## 2016-08-11 MED ORDER — INSULIN DETEMIR 100 UNIT/ML ~~LOC~~ SOLN: 60.0000 [IU] | Freq: Every day | SUBCUTANEOUS | 1 refills | Status: DC

## 2016-08-11 MED ORDER — LIRAGLUTIDE 18 MG/3ML ~~LOC~~ SOPN: 1.8000 mg | PEN_INJECTOR | Freq: Every day | SUBCUTANEOUS | 0 refills | Status: DC

## 2016-08-11 NOTE — Assessment & Plan Note (Signed)
Generalized abdominal pain with concern for possible gastrointestinal hemorrhage given diarrhea and potentially darkened stools. Obtain CT scan. Obtain gastrointestinal pathogen panel and fecal occult blood. Recommend avoiding nonsteroidal anti-inflammatories and aspirin. Continue to monitor pending lab and imaging results.

## 2016-08-11 NOTE — Assessment & Plan Note (Signed)
Type 2 diabetes with most recent A1c of 10.1 and remaining uncontrolled. Unlikely contributing factor to current symptoms. Continue current dosage of diabetes medications and follow-up in 2 months or sooner.

## 2016-08-11 NOTE — Progress Notes (Signed)
Subjective:    Patient ID: Bryan Becker, male    DOB: 1960-06-07, 56 y.o.   MRN: [id number]  Chief Complaint  Patient presents with  . Fall    HPI:  Bryan Becker is a 56 y.o. male who  has a past medical history of Anxiety; Arthritis; Asthma; CHF (congestive heart failure) ([place]); Chronic back pain; Chronic bronchitis ([place]); Cirrhosis ([place]); Confusion; COPD (chronic obstructive pulmonary disease) ([place]); Dementia; Dementia; Depression; Fall at home (06/04/14); Family history of adverse reaction to anesthesia; Fibromyalgia; Frequent urination; Gastric ulcer; GERD (gastroesophageal reflux disease); Headache(784.0); Hyperlipidemia; Hypertension; Kidney stone; Myocardial infarction; Nausea & vomiting; OSA (obstructive sleep apnea) (04/24/2013); Pancreatitis; Pneumonia; RMSF [facility] spotted fever) (11/22/2012); Seasonal allergies; Shortness of breath dyspnea; Stroke [facility]) (03/2013); Stroke [facility]) (12/04/2013); and Type II diabetes mellitus ([place]). and presents today for an acute office vist.  This is a new problem. Associated symptoms of weakness and multiple falls has been going on for about 1 month. Has had some chest pain that comes and go with some shortness of breath. Chest pain does get worse with activities and improves with rest. Last 2D echocardiogram was normal. Also experiencing diarrhea with occasional darker color stools. Frequency of bowel movements is 4-5 times per day. Reports he is eating and drinking okay with occasional nausea. No constipation or vomiting. He has concerns about his B12 levels and describes that in the past that he has had to have multiple B12 injections.   Wt Readings from Last 3 Encounters:  08/11/16 291 lb (132 kg)  07/21/16 285 lb (129.3 kg)  07/08/16 289 lb (131.1 kg)     Allergies  Allergen Reactions  . Actos [Pioglitazone] Shortness Of Breath    Caused patient to be in CHF  . Aricept [Donepezil Hcl] Diarrhea      Outpatient Medications Prior  to Visit  Medication Sig Dispense Refill  . albuterol (ACCUNEB) 1.25 MG/3ML nebulizer solution Take 1 ampule by nebulization every 6 (six) hours as needed for wheezing.    . bisoprolol (ZEBETA) 5 MG tablet One twice daily (Patient taking differently: Take 5 mg by mouth 2 (two) times daily. ) 180 tablet 3  . canagliflozin (INVOKANA) 100 MG TABS tablet Take 100 mg by mouth.    . Cholecalciferol (VITAMIN D3) 50000 units CAPS Take 50,000 Units by mouth once a week. 8 capsule 0  . citalopram (CELEXA) 40 MG tablet Take 40 mg by mouth daily.      . clopidogrel (PLAVIX) 75 MG tablet Take 1 tablet (75 mg total) by mouth daily. Restart on 07/11/2016    . cyclobenzaprine (FLEXERIL) 10 MG tablet Take 1 tablet (10 mg total) by mouth 3 (three) times daily as needed for muscle spasms. 60 tablet 0  . docusate sodium (COLACE) 100 MG capsule Take 100 mg by mouth daily as needed for mild constipation.    . fenofibrate 160 MG tablet Take 160 mg by mouth daily.    . fexofenadine (ALLEGRA) 180 MG tablet Take 180 mg by mouth daily.    . finasteride (PROSCAR) 5 MG tablet Take 5 mg by mouth daily.    . fluticasone (FLONASE) 50 MCG/ACT nasal spray Place 2 sprays into both nostrils daily as needed for allergies.     . furosemide (LASIX) 40 MG tablet TAKE 1 TO 2 TABLETS TWICE DAILY AS DIRECTED 360 tablet 2  . gabapentin (NEURONTIN) 600 MG tablet Take 1,200 mg by mouth 3 (three) times daily.    Marland Kitchen gemfibrozil (LOPID)  600 MG tablet Take 600 mg by mouth daily.    Marland Kitchen glimepiride (AMARYL) 2 MG tablet Take 2 mg by mouth daily.    . Glycopyrrolate-Formoterol (BEVESPI AEROSPHERE) 9-4.8 MCG/ACT AERO Inhale 2 puffs into the lungs 2 (two) times daily. 1 Inhaler 4  . HYDROcodone-acetaminophen (NORCO) 10-325 MG tablet Take 1 tablet by mouth every 6 (six) hours as needed for moderate pain or severe pain.     . Insulin Aspart (NOVOLOG FLEXPEN [place]) Inject 20-30 Units into the skin 4 (four) times daily.     . irbesartan (AVAPRO) 300 MG tablet  Take 1 tablet (300 mg total) by mouth daily. (Patient taking differently: Take 300 mg by mouth 2 (two) times daily. ) 90 tablet 1  . L-Lysine 1000 MG TABS Take 1,000 mg by mouth daily as needed (for fever blisters).    . LORazepam (ATIVAN) 1 MG tablet Take 1 mg by mouth every 8 (eight) hours as needed for anxiety.     . meloxicam (MOBIC) 7.5 MG tablet Take 7.5 mg by mouth daily.    . memantine (NAMENDA) 10 MG tablet Take 10 mg by mouth 2 (two) times daily.    Marland Kitchen omeprazole (PRILOSEC) 40 MG capsule Take 40 mg by mouth daily.    . ondansetron (ZOFRAN) 4 MG tablet Take 1 tablet (4 mg total) by mouth every 8 (eight) hours as needed for nausea or vomiting. 20 tablet 0  . OXYGEN 2lpm with sleep and occ as needed during the day [employer]    . Potassium Gluconate 595 MG CAPS Take 2 capsules by mouth daily.    . prochlorperazine (COMPAZINE) 25 MG suppository Place 1 suppository (25 mg total) rectally every 12 (twelve) hours as needed for nausea or vomiting. 12 suppository 0  . promethazine (PHENERGAN) 25 MG tablet Take 1 tablet (25 mg total) by mouth every 6 (six) hours as needed for nausea or vomiting. 15 tablet 0  . rifaximin (XIFAXAN) 550 MG TABS tablet Take 1 tablet (550 mg total) by mouth 3 (three) times daily. 42 tablet 0  . Specialty Vitamins Products (MAGNESIUM, AMINO ACID CHELATE,) 133 MG tablet Take 1 tablet by mouth 2 (two) times daily.    . sucralfate (CARAFATE) 1 g tablet Take 1 g by mouth 4 (four) times daily -  with meals and at bedtime.    . tamsulosin (FLOMAX) 0.4 MG CAPS capsule Take 0.4 mg by mouth daily.    . TOVIAZ 4 MG TB24 tablet Take 4 mg by mouth daily.    . traMADol (ULTRAM) 50 MG tablet Take 50 mg by mouth every 8 (eight) hours as needed for moderate pain.     Marland Kitchen umeclidinium-vilanterol (ANORO ELLIPTA) 62.5-25 MCG/INH AEPB Inhale 1 puff into the lungs daily. 3 each 3  . VENTOLIN HFA 108 (90 Base) MCG/ACT inhaler INHALE 2 PUFFS EVERY 6 HOURS AS NEEDED 54 g 2  . insulin detemir  (LEVEMIR) 100 UNIT/ML injection Inject 60 Units into the skin at bedtime.     . Liraglutide 18 MG/3ML SOPN Inject 1.8 mg into the skin daily.     No facility-administered medications prior to visit.       Past Surgical History:  Procedure Laterality Date  . CARDIAC CATHETERIZATION     10/28/05: right dominant, normal LV systolic function, normal coronaries. (Dr. Baxter Hire, [facility])  . CHOLECYSTECTOMY    . COLONOSCOPY    . COLONOSCOPY WITH PROPOFOL N/A 07/08/2016   Procedure: COLONOSCOPY WITH PROPOFOL;  Surgeon: Gatha Mayer,  MD;  Location: [facility];  Service: Endoscopy;  Laterality: N/A;  . CYSTOSCOPY W/ STONE MANIPULATION    . LUMBAR LAMINECTOMY/DECOMPRESSION MICRODISCECTOMY Left 06/18/2014   Procedure: LUMBAR LAMINECTOMY/DECOMPRESSION MICRODISCECTOMY LEFT LUMBAR FIVE-SACRAL ONE;  Surgeon: Ashok Pall, MD;  Location: [place] [facility];  Service: Neurosurgery;  Laterality: Left;  left  . multiple cysts removal-hip,wrist    . TONSILLECTOMY  ~ 1985  . UPPER GASTROINTESTINAL ENDOSCOPY        Past Medical History:  Diagnosis Date  . Anxiety   . Arthritis    "joints"  . Asthma   . CHF (congestive heart failure) ([place])   . Chronic back pain    "mostly lower; have 2 bulging discs in my neck" (12/05/2013)  . Chronic bronchitis ([place])    "get it q yr"  . 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 Confusion   . COPD (chronic obstructive pulmonary disease) ([place])   . Dementia    onset of early  . Dementia   . Depression   . Fall at home 06/04/14   Pt stated he falls off the bed at nightly  . Family history of adverse reaction to anesthesia    Patients mother had a bad reaction to Anesthesia however pt is unaware of reaction  . Fibromyalgia   . Frequent urination   . Gastric ulcer   . GERD (gastroesophageal reflux disease)   . EUMPNTIR(443.1)    "weekly" (12/04/2013)  . Hyperlipidemia   . Hypertension   . Kidney stone   . Myocardial infarction    unsure about  MI history, but had normal cath that admission '07 ([facility])  . Nausea & vomiting   . OSA (obstructive sleep apnea) 04/24/2013   "use BiPAP"  . Pancreatitis   . Pneumonia    "several times"  . RMSF [facility] spotted fever) 11/22/2012  . Seasonal allergies   . Shortness of breath dyspnea    with ambulation  . Stroke [facility]) 03/2013   "still weak on the left side" (12/05/2013)  . Stroke [facility]) 12/04/2013   "came in today w/right sided weakness" (12/04/2013)  . Type II diabetes mellitus ([place])       Review of Systems  Constitutional: Negative for chills and fever.  Respiratory: Positive for shortness of breath. Negative for chest tightness and wheezing.   Cardiovascular: Positive for chest pain. Negative for palpitations and leg swelling.  Neurological: Positive for weakness and light-headedness. Negative for dizziness, seizures and syncope.      Objective:    BP (!) 162/88 (BP Location: Left Arm, Patient Position: Sitting, Cuff Size: Large)   Pulse 82   Temp 98.3 F (36.8 C) (Oral)   Resp 16   Ht _0  (1.753 m)   Wt 291 lb (132 kg)   SpO2 97%   BMI 42.97 kg/m  Nursing note and vital signs reviewed.  Physical Exam  Constitutional: He is oriented to person, place, and time. He appears well-developed and well-nourished. No distress.  Cardiovascular: Normal rate, regular rhythm, normal heart sounds and intact distal pulses.   Pulmonary/Chest: Effort normal and breath sounds normal.  Abdominal: Normal appearance and bowel sounds are normal. He exhibits no mass. There is no hepatosplenomegaly. There is generalized tenderness. There is no rigidity, no guarding, no tenderness at McBurney's point and negative Murphy's sign.  Neurological: He is alert and oriented to person, place, and time.  Skin: Skin is warm and dry.  Psychiatric: He has a normal mood  and affect. His behavior is normal. Judgment and thought content normal.       Assessment & Plan:   Problem List Items Addressed  This Visit      Endocrine   Diabetes mellitus type II, uncontrolled ([place])    Type 2 diabetes with most recent A1c of 10.1 and remaining uncontrolled. Unlikely contributing factor to current symptoms. Continue current dosage of diabetes medications and follow-up in 2 months or sooner.      Relevant Medications   insulin detemir (LEVEMIR) 100 UNIT/ML injection   liraglutide 18 MG/3ML SOPN     Other   Morbid (severe) obesity due to excess calories (H[facility])   Relevant Medications   insulin detemir (LEVEMIR) 100 UNIT/ML injection   liraglutide 18 MG/3ML SOPN   Weakness - Primary    Generalized abdominal pain in the setting of a fall with no loss of consciousness or head injury. In office ECG shows normal sinus rhythm with no evidence of ACS. Blood sugars within normal limits. Obtain B12, CBC with differential, complete metabolic profile, fecal occult blood and Mina chemical, gastrointestinal pathogen and CT scan. There is concern for gastrointestinal bleeding. Follow-up pending blood work and imaging results. Advised to seek further/emergency care if symptoms worsen or do not improve prior to results.      Relevant Orders   EKG 12-Lead (Completed)   B12 (Completed)   CBC w/Diff (Completed)   Comp Met (CMET) (Completed)   Fecal occult blood, imunochemical (IFOB)   Gastrointestinal Pathogen Panel PCR   Generalized abdominal pain    Generalized abdominal pain with concern for possible gastrointestinal hemorrhage given diarrhea and potentially darkened stools. Obtain CT scan. Obtain gastrointestinal pathogen panel and fecal occult blood. Recommend avoiding nonsteroidal anti-inflammatories and aspirin. Continue to monitor pending lab and imaging results.      Relevant Orders   CT Abdomen Pelvis W Contrast   B12 deficiency    Receive most recent B12 injection on 07/21/16. Has concern for low B12 levels. Obtain vitamin B12. Consider additional injections pending blood work results.      Relevant  Orders   B12 (Completed)       I have changed Mr. Gilmore's insulin detemir and liraglutide. I am also having him maintain his LORazepam, citalopram, Potassium Gluconate, fluticasone, gabapentin, ondansetron, traMADol, prochlorperazine, promethazine, gemfibrozil, L-Lysine, magnesium (amino acid chelate), docusate sodium, fexofenadine, canagliflozin, cyclobenzaprine, finasteride, albuterol, HYDROcodone-acetaminophen, OXYGEN, Glycopyrrolate-Formoterol, irbesartan, Insulin Aspart (NOVOLOG FLEXPEN [place]), rifaximin, umeclidinium-vilanterol, bisoprolol, VENTOLIN HFA, sucralfate, fenofibrate, memantine, omeprazole, tamsulosin, meloxicam, glimepiride, TOVIAZ, Vitamin D3, clopidogrel, and furosemide.   Meds ordered this encounter  Medications  . insulin detemir (LEVEMIR) 100 UNIT/ML injection    Sig: Inject 0.6 mLs (60 Units total) into the skin at bedtime.    Dispense:  30 mL    Refill:  1    Order Specific Question:   Supervising Provider    Answer:   Pricilla Holm A [[id number]]  . liraglutide 18 MG/3ML SOPN    Sig: Inject 0.3 mLs (1.8 mg total) into the skin daily.    Dispense:  27 mL    Refill:  0    Order Specific Question:   Supervising Provider    Answer:   Pricilla Holm A [[id number]]     Follow-up: Return if symptoms worsen or fail to improve.  Mauricio Po, FNP

## 2016-08-11 NOTE — Patient Instructions (Addendum)
Thank you for choosing [facility].  SUMMARY AND INSTRUCTIONS:  Please continue to take your medications as prescribed.   We will set up further treatment pending your A1c results.  Please continue to drink plenty of fluids.   We will continue to work on pain management.  Continue to monitor your blood pressure at home.  0  Medication:  Your prescription(s) have been submitted to your pharmacy or been printed and provided for you. Please take as directed and contact our office if you believe you are having problem(s) with the medication(s) or have any questions.  Labs:  Please stop by the lab on the lower level of the building for your blood work. Your results will be released to [place] (or called to you) after review, usually within 72 hours after test completion. If any changes need to be made, you will be notified at that same time.  1.) The lab is open from 7:30am to 5:30 pm Monday-Friday 2.) No appointment is necessary 3.) Fasting (if needed) is 6-8 hours after food and drink; black coffee and water are okay   Imaging / Radiology:  Please stop by radiology on the basement level of the building for your x-rays. Your results will be released to [place] (or called to you) after review, usually within 72 hours after test completion. If any treatments or changes are necessary, you will be notified at that same time.  Follow up:  If your symptoms worsen or fail to improve, please contact our office for further instruction, or in case of emergency go directly to the emergency room at the closest medical facility.

## 2016-08-11 NOTE — Assessment & Plan Note (Signed)
Generalized abdominal pain in the setting of a fall with no loss of consciousness or head injury. In office ECG shows normal sinus rhythm with no evidence of ACS. Blood sugars within normal limits. Obtain B12, CBC with differential, complete metabolic profile, fecal occult blood and Mina chemical, gastrointestinal pathogen and CT scan. There is concern for gastrointestinal bleeding. Follow-up pending blood work and imaging results. Advised to seek further/emergency care if symptoms worsen or do not improve prior to results.

## 2016-08-11 NOTE — Assessment & Plan Note (Signed)
Receive most recent B12 injection on 07/21/16. Has concern for low B12 levels. Obtain vitamin B12. Consider additional injections pending blood work results.

## 2016-08-12 NOTE — Telephone Encounter (Signed)
Pt called stating that he received a MyChart message but he could not open it. I read the message to the pt.  He said that he would like to continue with the CT scan. Can we order this for him? Pt would also like to do the B12 injections. He said that another doctor tried to prescribe vials of B12 for him to give to himself at home but his insurance would not cover it. He said that he will not be able to come up here every day to have them done but would like to do them himself at home if possible.  Please advise. Thanks [employer]

## 2016-08-15 ENCOUNTER — Ambulatory Visit (INDEPENDENT_AMBULATORY_CARE_PROVIDER_SITE_OTHER): Payer: Medicare HMO | Admitting: Family

## 2016-08-15 ENCOUNTER — Encounter: Payer: Self-pay | Admitting: Family

## 2016-08-15 VITALS — BP 150/84 | HR 79 | Temp 98.7°F | Resp 16 | Ht 69.0 in | Wt 291.0 lb

## 2016-08-15 DIAGNOSIS — M797 Fibromyalgia: Secondary | ICD-10-CM

## 2016-08-15 DIAGNOSIS — M79602 Pain in left arm: Secondary | ICD-10-CM | POA: Diagnosis not present

## 2016-08-15 DIAGNOSIS — E538 Deficiency of other specified B group vitamins: Secondary | ICD-10-CM | POA: Diagnosis not present

## 2016-08-15 MED ORDER — LIRAGLUTIDE 18 MG/3ML ~~LOC~~ SOPN: 1.8000 mg | PEN_INJECTOR | Freq: Every day | SUBCUTANEOUS | 0 refills | Status: DC

## 2016-08-15 MED ORDER — PREGABALIN 75 MG PO CAPS: 75.0000 mg | ORAL_CAPSULE | Freq: Two times a day (BID) | ORAL | 0 refills | Status: DC

## 2016-08-15 MED ORDER — CYCLOBENZAPRINE HCL 10 MG PO TABS: 10.0000 mg | ORAL_TABLET | Freq: Three times a day (TID) | ORAL | 0 refills | Status: DC | PRN

## 2016-08-15 MED ORDER — INSULIN DETEMIR 100 UNIT/ML ~~LOC~~ SOLN: 60.0000 [IU] | Freq: Every day | SUBCUTANEOUS | 0 refills | Status: DC

## 2016-08-15 MED ORDER — CYANOCOBALAMIN 1000 MCG/ML IJ SOLN
1000.0000 ug | Freq: Once | INTRAMUSCULAR | Status: AC
  Administered 2016-08-15: 1000 ug via INTRAMUSCULAR

## 2016-08-15 MED ORDER — LIRAGLUTIDE 18 MG/3ML ~~LOC~~ SOPN: 1.8000 mg | PEN_INJECTOR | Freq: Every day | SUBCUTANEOUS | 3 refills | Status: DC

## 2016-08-15 MED ORDER — CITALOPRAM HYDROBROMIDE 40 MG PO TABS: 40.0000 mg | ORAL_TABLET | Freq: Every day | ORAL | 0 refills | Status: DC

## 2016-08-15 MED ORDER — INSULIN DETEMIR 100 UNIT/ML ~~LOC~~ SOLN: 60.0000 [IU] | Freq: Every day | SUBCUTANEOUS | 3 refills | Status: DC

## 2016-08-15 MED ORDER — PANTOPRAZOLE SODIUM 40 MG PO TBEC: 40.0000 mg | DELAYED_RELEASE_TABLET | Freq: Every day | ORAL | 0 refills | Status: DC

## 2016-08-15 NOTE — Assessment & Plan Note (Signed)
New onset pain of left upper extremity with concern for cervical origin although there is some edema located in the left hand. There is generalized tenderness. Unlikely blood clot or stroke given anticoagulation on Plavix. Cannot rule out gout. Treat conservatively with restarting Flexeril and continue current dosage of Tramadol. Cervical stretching exercises. Follow up if symptoms worsen or does not improve.

## 2016-08-15 NOTE — Patient Instructions (Addendum)
Thank you for choosing [facility].  SUMMARY AND INSTRUCTIONS:  Ice and moist heat x 20 minutes every 2 hours and as need.  Start the Lyrica twice daily for pain.  We can consider increasing your Tramadol.   Medication:  Start the Flexeril.  Your prescription(s) have been submitted to your pharmacy or been printed and provided for you. Please take as directed and contact our office if you believe you are having problem(s) with the medication(s) or have any questions.  Follow up:  If your symptoms worsen or fail to improve, please contact our office for further instruction, or in case of emergency go directly to the emergency room at the closest medical facility.    Cervical Strain and Sprain Rehab Ask your health care provider which exercises are safe for you. Do exercises exactly as told by your health care provider and adjust them as directed. It is normal to feel mild stretching, pulling, tightness, or discomfort as you do these exercises, but you should stop right away if you feel sudden pain or your pain gets worse.Do not begin these exercises until told by your health care provider. Stretching and range of motion exercises These exercises warm up your muscles and joints and improve the movement and flexibility of your neck. These exercises also help to relieve pain, numbness, and tingling. Exercise A: Cervical side bend   1. Using good posture, sit on a stable chair or stand up. 2. Without moving your shoulders, slowly tilt your left / right ear to your shoulder until you feel a stretch in your neck muscles. You should be looking straight ahead. 3. Hold for __________ seconds. 4. Repeat with the other side of your neck. Repeat __________ times. Complete this exercise __________ times a day. Exercise B: Cervical rotation   1. Using good posture, sit on a stable chair or stand up. 2. Slowly turn your head to the side as if you are looking over your left / right  shoulder.  Keep your eyes level with the ground.  Stop when you feel a stretch along the side and the back of your neck. 3. Hold for __________ seconds. 4. Repeat this by turning to your other side. Repeat __________ times. Complete this exercise __________ times a day. Exercise C: Thoracic extension and pectoral stretch  1. Roll a towel or a small blanket so it is about 4 inches (10 cm) in diameter. 2. Lie down on your back on a firm surface. 3. Put the towel lengthwise, under your spine in the middle of your back. It should not be not under your shoulder blades. The towel should line up with your spine from your middle back to your lower back. 4. Put your hands behind your head and let your elbows fall out to your sides. 5. Hold for __________ seconds. Repeat __________ times. Complete this exercise __________ times a day. Strengthening exercises These exercises build strength and endurance in your neck. Endurance is the ability to use your muscles for a long time, even after your muscles get tired. Exercise D: Upper cervical flexion, isometric  1. Lie on your back with a thin pillow behind your head and a small rolled-up towel under your neck. 2. Gently tuck your chin toward your chest and nod your head down to look toward your feet. Do not lift your head off the pillow. 3. Hold for __________ seconds. 4. Release the tension slowly. Relax your neck muscles completely before you repeat this exercise. Repeat __________ times. Complete  this exercise __________ times a day. Exercise E: Cervical extension, isometric   1. Stand about 6 inches (15 cm) away from a wall, with your back facing the wall. 2. Place a soft object, about 6-8 inches (15-20 cm) in diameter, between the back of your head and the wall. A soft object could be a small pillow, a ball, or a folded towel. 3. Gently tilt your head back and press into the soft object. Keep your jaw and forehead relaxed. 4. Hold for __________  seconds. 5. Release the tension slowly. Relax your neck muscles completely before you repeat this exercise. Repeat __________ times. Complete this exercise __________ times a day. Posture and body mechanics   Body mechanics refers to the movements and positions of your body while you do your daily activities. Posture is part of body mechanics. Good posture and healthy body mechanics can help to relieve stress in your body's tissues and joints. Good posture means that your spine is in its natural S-curve position (your spine is neutral), your shoulders are pulled back slightly, and your head is not tipped forward. The following are general guidelines for applying improved posture and body mechanics to your everyday activities. Standing   When standing, keep your spine neutral and keep your feet about hip-width apart. Keep a slight bend in your knees. Your ears, shoulders, and hips should line up.  When you do a task in which you stand in one place for a long time, place one foot up on a stable object that is 2-4 inches (5-10 cm) high, such as a footstool. This helps keep your spine neutral. Sitting    When sitting, keep your spine neutral and your keep feet flat on the floor. Use a footrest, if necessary, and keep your thighs parallel to the floor. Avoid rounding your shoulders, and avoid tilting your head forward.  When working at a desk or a computer, keep your desk at a height where your hands are slightly lower than your elbows. Slide your chair under your desk so you are close enough to maintain good posture.  When working at a computer, place your monitor at a height where you are looking straight ahead and you do not have to tilt your head forward or downward to look at the screen. Resting  When lying down and resting, avoid positions that are most painful for you. Try to support your neck in a neutral position. You can use a contour pillow or a small rolled-up towel. Your pillow should  support your neck but not push on it. This information is not intended to replace advice given to you by your health care provider. Make sure you discuss any questions you have with your health care provider. Document Released: 05/09/2005 Document Revised: 01/14/2016 Document Reviewed: 04/15/2015 Elsevier Interactive Patient Education  2017 [employer].

## 2016-08-15 NOTE — Assessment & Plan Note (Signed)
Fibromyalgia symptoms remain labile with current medication regimen and no adverse side effects. There is concern with him being on high doses of narcotics. Attempt to wean off of narcotics. Start Lyrica. Continue current dosage of gabapentin and restart Flexeril. Consider increasing tramadol as needed for breakthrough pain. Continue to monitor and follow-up in one month or sooner.

## 2016-08-15 NOTE — Assessment & Plan Note (Signed)
Previous B12 normal with patient continued concern for B12 deficiency. In office injection of B12 provided. Restart weekly B12 injections 1 month and then bimonthly 2 months and then monthly. Continue to monitor.

## 2016-08-15 NOTE — Progress Notes (Signed)
Subjective:    Patient ID: Bryan Becker, male    DOB: November 03, 1960, 56 y.o.   MRN: [id number]  Chief Complaint  Patient presents with  . Arm Pain    states that left arm started being very painful last night, can not stand to move it, left leg pain    HPI:  Bryan Becker is a 56 y.o. male who  has a past medical history of Anxiety; Arthritis; Asthma; CHF (congestive heart failure) ([place]); Chronic back pain; Chronic bronchitis ([place]); Cirrhosis ([place]); Confusion; COPD (chronic obstructive pulmonary disease) ([place]); Dementia; Dementia; Depression; Fall at home (06/04/14); Family history of adverse reaction to anesthesia; Fibromyalgia; Frequent urination; Gastric ulcer; GERD (gastroesophageal reflux disease); Headache(784.0); Hyperlipidemia; Hypertension; Kidney stone; Myocardial infarction; Nausea & vomiting; OSA (obstructive sleep apnea) (04/24/2013); Pancreatitis; Pneumonia; RMSF [facility] spotted fever) (11/22/2012); Seasonal allergies; Shortness of breath dyspnea; Stroke [facility]) (03/2013); Stroke [facility]) (12/04/2013); and Type II diabetes mellitus ([place]). and presents today for an acute office visit.  1.) Left arm pain - This is a new problem. Associated symptoms of pain located in his left arm has been going on for about 12 hours. Describes as "killing me to move it." Denies any trauma or injury. Does have limitations in movement. Denies any modifying factors or attempted treatments. He has concerns that it is related to his B12. Does endorse some neck stiffness   2.) Fibromyalgia - Tramadol, Neurotin, Norco, and Citalopram. Reports taking the medication as prescribed and denies adverse side effects. He continues to have symptoms of neuropathic pain with the current medication regimen. Indicates that he is working on physical activity but does find it challenging. t   Allergies  Allergen Reactions  . Actos [Pioglitazone] Shortness Of Breath    Caused patient to be in CHF  . Aricept [Donepezil  Hcl] Diarrhea      Outpatient Medications Prior to Visit  Medication Sig Dispense Refill  . albuterol (ACCUNEB) 1.25 MG/3ML nebulizer solution Take 1 ampule by nebulization every 6 (six) hours as needed for wheezing.    . bisoprolol (ZEBETA) 5 MG tablet One twice daily (Patient taking differently: Take 5 mg by mouth 2 (two) times daily. ) 180 tablet 3  . canagliflozin (INVOKANA) 100 MG TABS tablet Take 100 mg by mouth.    . Cholecalciferol (VITAMIN D3) 50000 units CAPS Take 50,000 Units by mouth once a week. 8 capsule 0  . clopidogrel (PLAVIX) 75 MG tablet Take 1 tablet (75 mg total) by mouth daily. Restart on 07/11/2016    . docusate sodium (COLACE) 100 MG capsule Take 100 mg by mouth daily as needed for mild constipation.    . fenofibrate 160 MG tablet Take 160 mg by mouth daily.    . fexofenadine (ALLEGRA) 180 MG tablet Take 180 mg by mouth daily.    . finasteride (PROSCAR) 5 MG tablet Take 5 mg by mouth daily.    . fluticasone (FLONASE) 50 MCG/ACT nasal spray Place 2 sprays into both nostrils daily as needed for allergies.     . furosemide (LASIX) 40 MG tablet TAKE 1 TO 2 TABLETS TWICE DAILY AS DIRECTED 360 tablet 2  . gabapentin (NEURONTIN) 600 MG tablet Take 1,200 mg by mouth 3 (three) times daily.    Marland Kitchen gemfibrozil (LOPID) 600 MG tablet Take 600 mg by mouth daily.    Marland Kitchen glimepiride (AMARYL) 2 MG tablet Take 2 mg by mouth daily.    . Glycopyrrolate-Formoterol (BEVESPI AEROSPHERE) 9-4.8 MCG/ACT AERO Inhale 2 puffs into  the lungs 2 (two) times daily. 1 Inhaler 4  . Insulin Aspart (NOVOLOG FLEXPEN [place]) Inject 20-30 Units into the skin 4 (four) times daily.     . irbesartan (AVAPRO) 300 MG tablet Take 1 tablet (300 mg total) by mouth daily. (Patient taking differently: Take 300 mg by mouth 2 (two) times daily. ) 90 tablet 1  . L-Lysine 1000 MG TABS Take 1,000 mg by mouth daily as needed (for fever blisters).    . LORazepam (ATIVAN) 1 MG tablet Take 1 mg by mouth every 8 (eight) hours as needed  for anxiety.     . meloxicam (MOBIC) 7.5 MG tablet Take 7.5 mg by mouth daily.    . memantine (NAMENDA) 10 MG tablet Take 10 mg by mouth 2 (two) times daily.    . ondansetron (ZOFRAN) 4 MG tablet Take 1 tablet (4 mg total) by mouth every 8 (eight) hours as needed for nausea or vomiting. 20 tablet 0  . OXYGEN 2lpm with sleep and occ as needed during the day [employer]    . Potassium Gluconate 595 MG CAPS Take 2 capsules by mouth daily.    . prochlorperazine (COMPAZINE) 25 MG suppository Place 1 suppository (25 mg total) rectally every 12 (twelve) hours as needed for nausea or vomiting. 12 suppository 0  . promethazine (PHENERGAN) 25 MG tablet Take 1 tablet (25 mg total) by mouth every 6 (six) hours as needed for nausea or vomiting. 15 tablet 0  . rifaximin (XIFAXAN) 550 MG TABS tablet Take 1 tablet (550 mg total) by mouth 3 (three) times daily. 42 tablet 0  . Specialty Vitamins Products (MAGNESIUM, AMINO ACID CHELATE,) 133 MG tablet Take 1 tablet by mouth 2 (two) times daily.    . sucralfate (CARAFATE) 1 g tablet Take 1 g by mouth 4 (four) times daily -  with meals and at bedtime.    . tamsulosin (FLOMAX) 0.4 MG CAPS capsule Take 0.4 mg by mouth daily.    . TOVIAZ 4 MG TB24 tablet Take 4 mg by mouth daily.    . traMADol (ULTRAM) 50 MG tablet Take 50 mg by mouth every 8 (eight) hours as needed for moderate pain.     Marland Kitchen umeclidinium-vilanterol (ANORO ELLIPTA) 62.5-25 MCG/INH AEPB Inhale 1 puff into the lungs daily. 3 each 3  . VENTOLIN HFA 108 (90 Base) MCG/ACT inhaler INHALE 2 PUFFS EVERY 6 HOURS AS NEEDED 54 g 2  . citalopram (CELEXA) 40 MG tablet Take 40 mg by mouth daily.      . cyclobenzaprine (FLEXERIL) 10 MG tablet Take 1 tablet (10 mg total) by mouth 3 (three) times daily as needed for muscle spasms. 60 tablet 0  . HYDROcodone-acetaminophen (NORCO) 10-325 MG tablet Take 1 tablet by mouth every 6 (six) hours as needed for moderate pain or severe pain.     Marland Kitchen insulin detemir (LEVEMIR) 100 UNIT/ML  injection Inject 0.6 mLs (60 Units total) into the skin at bedtime. 30 mL 1  . liraglutide 18 MG/3ML SOPN Inject 0.3 mLs (1.8 mg total) into the skin daily. 27 mL 0  . omeprazole (PRILOSEC) 40 MG capsule Take 40 mg by mouth daily.     No facility-administered medications prior to visit.       Past Surgical History:  Procedure Laterality Date  . CARDIAC CATHETERIZATION     10/28/05: right dominant, normal LV systolic function, normal coronaries. (Dr. Baxter Hire, [facility])  . CHOLECYSTECTOMY    . COLONOSCOPY    . COLONOSCOPY WITH PROPOFOL  N/A 07/08/2016   Procedure: COLONOSCOPY WITH PROPOFOL;  Surgeon: Gatha Mayer, MD;  Location: [facility] END[facility]COPY;  Service: Endoscopy;  Laterality: N/A;  . CYSTOSCOPY W/ STONE MANIPULATION    . LUMBAR LAMINECTOMY/DECOMPRESSION MICRODISCECTOMY Left 06/18/2014   Procedure: LUMBAR LAMINECTOMY/DECOMPRESSION MICRODISCECTOMY LEFT LUMBAR FIVE-SACRAL ONE;  Surgeon: Ashok Pall, MD;  Location: [place] [facility]O ORS;  Service: Neurosurgery;  Laterality: Left;  left  . multiple cysts removal-hip,wrist    . TONSILLECTOMY  ~ 1985  . UPPER GASTROINTESTINAL ENDOSCOPY        Past Medical History:  Diagnosis Date  . Anxiety   . Arthritis    "joints"  . Asthma   . CHF (congestive heart failure) ([place])   . Chronic back pain    "mostly lower; have 2 bulging discs in my neck" (12/05/2013)  . Chronic bronchitis ([place])    "get it q yr"  . 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 Confusion   . COPD (chronic obstructive pulmonary disease) ([place])   . Dementia    onset of early  . Dementia   . Depression   . Fall at home 06/04/14   Pt stated he falls off the bed at nightly  . Family history of adverse reaction to anesthesia    Patients mother had a bad reaction to Anesthesia however pt is unaware of reaction  . Fibromyalgia   . Frequent urination   . Gastric ulcer   . GERD (gastroesophageal reflux disease)   . VFIEPPIR(518.8)    "weekly" (12/04/2013)  .  Hyperlipidemia   . Hypertension   . Kidney stone   . Myocardial infarction    unsure about MI history, but had normal cath that admission '07 ([facility])  . Nausea & vomiting   . OSA (obstructive sleep apnea) 04/24/2013   "use BiPAP"  . Pancreatitis   . Pneumonia    "several times"  . RMSF [facility] spotted fever) 11/22/2012  . Seasonal allergies   . Shortness of breath dyspnea    with ambulation  . Stroke [facility]) 03/2013   "still weak on the left side" (12/05/2013)  . Stroke [facility]) 12/04/2013   "came in today w/right sided weakness" (12/04/2013)  . Type II diabetes mellitus ([place])       Review of Systems  Constitutional: Negative for chills and fever.  Respiratory: Negative for cough and chest tightness.   Cardiovascular: Negative for chest pain, palpitations and leg swelling.  Musculoskeletal: Positive for neck pain and neck stiffness.       Positive for left upper extremity pain.   Neurological: Positive for weakness.      Objective:    BP (!) 150/84 (BP Location: Left Arm, Patient Position: Sitting, Cuff Size: Large)   Pulse 79   Temp 98.7 F (37.1 C) (Oral)   Resp 16   Ht 5' 9"  (1.753 m)   Wt 291 lb (132 kg)   SpO2 96%   BMI 42.97 kg/m  Nursing note and vital signs reviewed.  Physical Exam  Constitutional: He is oriented to person, place, and time. He appears well-developed and well-nourished. No distress.  Neck:  No obvious deformity, discoloration, or edema. There is palpable tenderness along the transverse processes of C4-6 and left paraspinal musculature. Range of motion decreased secondary to discomfort.  Cardiovascular: Normal rate, regular rhythm, normal heart sounds and intact distal pulses.   Pulmonary/Chest: Effort normal and breath sounds normal.  Musculoskeletal:  Left upper extremity - no obvious deformity or discoloration  with mild edema located in the left hand. Pulses are intact and appropriate. There is decreased strength secondary to discomfort.    Neurological: He is alert and oriented to person, place, and time.  Skin: Skin is warm and dry.  Psychiatric: He has a normal mood and affect. His behavior is normal. Judgment and thought content normal.       Assessment & Plan:   Problem List Items Addressed This Visit      Other   Fibromyalgia    Fibromyalgia symptoms remain labile with current medication regimen and no adverse side effects. There is concern with him being on high doses of narcotics. Attempt to wean off of narcotics. Start Lyrica. Continue current dosage of gabapentin and restart Flexeril. Consider increasing tramadol as needed for breakthrough pain. Continue to monitor and follow-up in one month or sooner.      Relevant Medications   cyclobenzaprine (FLEXERIL) 10 MG tablet   citalopram (CELEXA) 40 MG tablet   pregabalin (LYRICA) 75 MG capsule   B12 deficiency    Previous B12 normal with patient continued concern for B12 deficiency. In office injection of B12 provided. Restart weekly B12 injections 1 month and then bimonthly 2 months and then monthly. Continue to monitor.      Relevant Medications   cyanocobalamin ((VITAMIN B-12)) injection 1,000 mcg (Completed)   Pain of left upper extremity - Primary    New onset pain of left upper extremity with concern for cervical origin although there is some edema located in the left hand. There is generalized tenderness. Unlikely blood clot or stroke given anticoagulation on Plavix. Cannot rule out gout. Treat conservatively with restarting Flexeril and continue current dosage of Tramadol. Cervical stretching exercises. Follow up if symptoms worsen or does not improve.       Relevant Medications   cyclobenzaprine (FLEXERIL) 10 MG tablet       I have discontinued Mr. Ayyad's HYDROcodone-acetaminophen and omeprazole. I have also changed his citalopram. Additionally, I am having him start on pregabalin and pantoprazole. Lastly, I am having him maintain his LORazepam,  Potassium Gluconate, fluticasone, gabapentin, ondansetron, traMADol, prochlorperazine, promethazine, gemfibrozil, L-Lysine, magnesium (amino acid chelate), docusate sodium, fexofenadine, canagliflozin, finasteride, albuterol, OXYGEN, Glycopyrrolate-Formoterol, irbesartan, Insulin Aspart (NOVOLOG FLEXPEN [place]), rifaximin, umeclidinium-vilanterol, bisoprolol, VENTOLIN HFA, sucralfate, fenofibrate, memantine, tamsulosin, meloxicam, glimepiride, TOVIAZ, Vitamin D3, clopidogrel, furosemide, liraglutide, insulin detemir, and cyclobenzaprine. We administered cyanocobalamin.   Meds ordered this encounter  Medications  . DISCONTD: insulin detemir (LEVEMIR) 100 UNIT/ML injection    Sig: Inject 0.6 mLs (60 Units total) into the skin at bedtime.    Dispense:  30 mL    Refill:  0  . DISCONTD: liraglutide 18 MG/3ML SOPN    Sig: Inject 0.3 mLs (1.8 mg total) into the skin daily.    Dispense:  27 mL    Refill:  0  . liraglutide 18 MG/3ML SOPN    Sig: Inject 0.3 mLs (1.8 mg total) into the skin daily.    Dispense:  27 mL    Refill:  3  . insulin detemir (LEVEMIR) 100 UNIT/ML injection    Sig: Inject 0.6 mLs (60 Units total) into the skin at bedtime.    Dispense:  30 mL    Refill:  3  . cyclobenzaprine (FLEXERIL) 10 MG tablet    Sig: Take 1 tablet (10 mg total) by mouth 3 (three) times daily as needed for muscle spasms.    Dispense:  90 tablet    Refill:  0  Order Specific Question:   Supervising Provider    Answer:   Pricilla Holm A [[id number]]  . citalopram (CELEXA) 40 MG tablet    Sig: Take 1 tablet (40 mg total) by mouth daily.    Dispense:  90 tablet    Refill:  0    Order Specific Question:   Supervising Provider    Answer:   Pricilla Holm A [[id number]]  . pregabalin (LYRICA) 75 MG capsule    Sig: Take 1 capsule (75 mg total) by mouth 2 (two) times daily.    Dispense:  60 capsule    Refill:  0    Order Specific Question:   Supervising Provider    Answer:   Pricilla Holm A [[id number]]  .  pantoprazole (PROTONIX) 40 MG tablet    Sig: Take 1 tablet (40 mg total) by mouth daily.    Dispense:  90 tablet    Refill:  0    Order Specific Question:   Supervising Provider    Answer:   Pricilla Holm A [[id number]]  . cyanocobalamin ((VITAMIN B-12)) injection 1,000 mcg   A total of 25 minutes were spent face-to-face with the patient during this encounter and over half of that time was spent on counseling and coordination of care.  We discussed in depth the treatments for fibromyalgia including non-pharmacological and pharmacological plans as well as the signs and symptoms of B12 deficiency.   Follow-up: Return in about 1 month (around 09/15/2016), or if symptoms worsen or fail to improve.  Mauricio Po, FNP

## 2016-08-18 ENCOUNTER — Ambulatory Visit (INDEPENDENT_AMBULATORY_CARE_PROVIDER_SITE_OTHER)
Admission: RE | Admit: 2016-08-18 | Discharge: 2016-08-18 | Disposition: A | Discharge status: Home / Self Care | Payer: Medicare HMO | Source: Ambulatory Visit | Attending: Family | Admitting: Family

## 2016-08-18 ENCOUNTER — Other Ambulatory Visit (INDEPENDENT_AMBULATORY_CARE_PROVIDER_SITE_OTHER): Payer: Medicare HMO

## 2016-08-18 DIAGNOSIS — R1084 Generalized abdominal pain: Secondary | ICD-10-CM | POA: Diagnosis not present

## 2016-08-18 DIAGNOSIS — R531 Weakness: Secondary | ICD-10-CM

## 2016-08-18 LAB — FECAL OCCULT BLOOD, IMMUNOCHEMICAL: FECAL OCCULT BLD: NEGATIVE

## 2016-08-18 MED ORDER — IOPAMIDOL (ISOVUE-300) INJECTION 61%
100.0000 mL | Freq: Once | INTRAVENOUS | Status: AC | PRN
  Administered 2016-08-18: 100 mL via INTRAVENOUS

## 2016-08-19 ENCOUNTER — Encounter: Payer: Self-pay | Admitting: Family

## 2016-08-23 ENCOUNTER — Ambulatory Visit (INDEPENDENT_AMBULATORY_CARE_PROVIDER_SITE_OTHER): Payer: Medicare HMO | Admitting: General Practice

## 2016-08-23 DIAGNOSIS — E538 Deficiency of other specified B group vitamins: Secondary | ICD-10-CM | POA: Diagnosis not present

## 2016-08-23 MED ORDER — CYANOCOBALAMIN 1000 MCG/ML IJ SOLN
1000.0000 ug | Freq: Once | INTRAMUSCULAR | Status: AC
  Administered 2016-08-23: 1000 ug via INTRAMUSCULAR

## 2016-08-26 LAB — GASTROINTESTINAL PATHOGEN PANEL PCR

## 2016-08-28 ENCOUNTER — Encounter: Payer: Self-pay | Admitting: Family

## 2016-08-29 ENCOUNTER — Ambulatory Visit: Payer: Medicare HMO | Admitting: Internal Medicine

## 2016-08-29 ENCOUNTER — Encounter: Payer: Self-pay | Admitting: Family

## 2016-08-29 ENCOUNTER — Ambulatory Visit: Payer: Medicare HMO | Admitting: Dietician

## 2016-08-29 ENCOUNTER — Ambulatory Visit (INDEPENDENT_AMBULATORY_CARE_PROVIDER_SITE_OTHER): Payer: Medicare HMO | Admitting: Family

## 2016-08-29 VITALS — BP 128/74 | HR 83 | Temp 97.9°F | Ht 69.0 in | Wt 290.0 lb

## 2016-08-29 DIAGNOSIS — E538 Deficiency of other specified B group vitamins: Secondary | ICD-10-CM | POA: Diagnosis not present

## 2016-08-29 DIAGNOSIS — M79602 Pain in left arm: Secondary | ICD-10-CM | POA: Diagnosis not present

## 2016-08-29 MED ORDER — CYANOCOBALAMIN 1000 MCG/ML IJ SOLN
1000.0000 ug | Freq: Once | INTRAMUSCULAR | Status: AC
  Administered 2016-08-29: 1000 ug via INTRAMUSCULAR

## 2016-08-29 NOTE — Patient Instructions (Addendum)
Thank you for choosing [facility].  SUMMARY AND INSTRUCTIONS:  You will receive a call for physical therapy.  Take it easy with the left arm for the next 24 hours.  Continue with ice and home exercise therapy.  We will consider an MRI if your symptoms do not improve.  This may increase your blood sugars. Increase your Lantus as needed.   Medication:  Your prescription(s) have been submitted to your pharmacy or been printed and provided for you. Please take as directed and contact our office if you believe you are having problem(s) with the medication(s) or have any questions.  Follow up:  If your symptoms worsen or fail to improve, please contact our office for further instruction, or in case of emergency go directly to the emergency room at the closest medical facility.

## 2016-08-29 NOTE — Progress Notes (Signed)
Pre visit review using our clinic review tool, if applicable. No additional management support is needed unless otherwise documented below in the visit note. 

## 2016-08-29 NOTE — Progress Notes (Signed)
Subjective:    Patient ID: Bryan Becker, male    DOB: [birth year], 56 y.o.   MRN: [id number]  Chief Complaint  Patient presents with  . Pain    HPI:  Bryan Becker is a 56 y.o. male who  has a past medical history of Anxiety; Arthritis; Asthma; CHF (congestive heart failure) ([place]); Chronic back pain; Chronic bronchitis ([place]); Cirrhosis ([place]); Confusion; COPD (chronic obstructive pulmonary disease) ([place]); Dementia; Dementia; Depression; Fall at home (06/04/14); Family history of adverse reaction to anesthesia; Fibromyalgia; Frequent urination; Gastric ulcer; GERD (gastroesophageal reflux disease); Headache(784.0); Hyperlipidemia; Hypertension; Kidney stone; Myocardial infarction; Nausea & vomiting; OSA (obstructive sleep apnea) (04/24/2013); Pancreatitis; Pneumonia; RMS[facility] spotted fever) (11/22/2012); Seasonal allergies; Shortness of breath dyspnea; Stroke [facility]) (03/2013); Stroke [facility]) (12/04/2013); and Type II diabetes mellitus ([name]). and presents today for a follow up office visit.   Continues to experience the associated symptoms of pain located in his left arm that he believes improved with a recent B12 shot. Continues to experience pain with overhead motion and difficulty with gripping. Modifying factors include ice and home exercise therapy. Denies any numbness/tingling. He is right hand dominant.  Allergies  Allergen Reactions  . Actos [Pioglitazone] Shortness Of Breath    Caused patient to be in CHF  . Aricept [Donepezil Hcl] Diarrhea      Outpatient Medications Prior to Visit  Medication Sig Dispense Refill  . albuterol (ACCUNEB) 1.25 MG/3ML nebulizer solution Take 1 ampule by nebulization every 6 (six) hours as needed for wheezing.    . bisoprolol (ZEBETA) 5 MG tablet One twice daily (Patient taking differently: Take 5 mg by mouth 2 (two) times daily. ) 180 tablet 3  . canagliflozin (INVOKANA) 100 MG TABS tablet Take 100 mg by mouth.    . Cholecalciferol (VITAMIN  D3) 50000 units CAPS Take 50,000 Units by mouth once a week. 8 capsule 0  . citalopram (CELEXA) 40 MG tablet Take 1 tablet (40 mg total) by mouth daily. 90 tablet 0  . clopidogrel (PLAVIX) 75 MG tablet Take 1 tablet (75 mg total) by mouth daily. Restart on 07/11/2016    . cyclobenzaprine (FLEXERIL) 10 MG tablet Take 1 tablet (10 mg total) by mouth 3 (three) times daily as needed for muscle spasms. 90 tablet 0  . docusate sodium (COLACE) 100 MG capsule Take 100 mg by mouth daily as needed for mild constipation.    . fenofibrate 160 MG tablet Take 160 mg by mouth daily.    . fexofenadine (ALLEGRA) 180 MG tablet Take 180 mg by mouth daily.    . finasteride (PROSCAR) 5 MG tablet Take 5 mg by mouth daily.    . fluticasone (FLONASE) 50 MCG/ACT nasal spray Place 2 sprays into both nostrils daily as needed for allergies.     . furosemide (LASIX) 40 MG tablet TAKE 1 TO 2 TABLETS TWICE DAILY AS DIRECTED 360 tablet 2  . gabapentin (NEURONTIN) 600 MG tablet Take 1,200 mg by mouth 3 (three) times daily.    Marland Kitchen gemfibrozil (LOPID) 600 MG tablet Take 600 mg by mouth daily.    Marland Kitchen glimepiride (AMARYL) 2 MG tablet Take 2 mg by mouth daily.    . Glycopyrrolate-Formoterol (BEVESPI AEROSPHERE) 9-4.8 MCG/ACT AERO Inhale 2 puffs into the lungs 2 (two) times daily. 1 Inhaler 4  . Insulin Aspart (NOVOLOG FLEXPEN [place]) Inject 20-30 Units into the skin 4 (four) times daily.     . insulin detemir (LEVEMIR) 100 UNIT/ML injection Inject 0.6 mLs (60  Units total) into the skin at bedtime. 30 mL 3  . irbesartan (AVAPRO) 300 MG tablet Take 1 tablet (300 mg total) by mouth daily. (Patient taking differently: Take 300 mg by mouth 2 (two) times daily. ) 90 tablet 1  . L-Lysine 1000 MG TABS Take 1,000 mg by mouth daily as needed (for fever blisters).    . liraglutide 18 MG/3ML SOPN Inject 0.3 mLs (1.8 mg total) into the skin daily. 27 mL 3  . LORazepam (ATIVAN) 1 MG tablet Take 1 mg by mouth every 8 (eight) hours as needed for anxiety.       . meloxicam (MOBIC) 7.5 MG tablet Take 7.5 mg by mouth daily.    . memantine (NAMENDA) 10 MG tablet Take 10 mg by mouth 2 (two) times daily.    . ondansetron (ZOFRAN) 4 MG tablet Take 1 tablet (4 mg total) by mouth every 8 (eight) hours as needed for nausea or vomiting. 20 tablet 0  . OXYGEN 2lpm with sleep and occ as needed during the day [facility]    . pantoprazole (PROTONIX) 40 MG tablet Take 1 tablet (40 mg total) by mouth daily. 90 tablet 0  . Potassium Gluconate 595 MG CAPS Take 2 capsules by mouth daily.    . pregabalin (LYRICA) 75 MG capsule Take 1 capsule (75 mg total) by mouth 2 (two) times daily. 60 capsule 0  . prochlorperazine (COMPAZINE) 25 MG suppository Place 1 suppository (25 mg total) rectally every 12 (twelve) hours as needed for nausea or vomiting. 12 suppository 0  . promethazine (PHENERGAN) 25 MG tablet Take 1 tablet (25 mg total) by mouth every 6 (six) hours as needed for nausea or vomiting. 15 tablet 0  . rifaximin (XIFAXAN) 550 MG TABS tablet Take 1 tablet (550 mg total) by mouth 3 (three) times daily. 42 tablet 0  . Specialty Vitamins Products (MAGNESIUM, AMINO ACID CHELATE,) 133 MG tablet Take 1 tablet by mouth 2 (two) times daily.    . sucralfate (CARAFATE) 1 g tablet Take 1 g by mouth 4 (four) times daily -  with meals and at bedtime.    . tamsulosin (FLOMAX) 0.4 MG CAPS capsule Take 0.4 mg by mouth daily.    . TOVIAZ 4 MG TB24 tablet Take 4 mg by mouth daily.    . traMADol (ULTRAM) 50 MG tablet Take 50 mg by mouth every 8 (eight) hours as needed for moderate pain.     Marland Kitchen umeclidinium-vilanterol (ANORO ELLIPTA) 62.5-25 MCG/INH AEPB Inhale 1 puff into the lungs daily. 3 each 3  . VENTOLIN HFA 108 (90 Base) MCG/ACT inhaler INHALE 2 PUFFS EVERY 6 HOURS AS NEEDED 54 g 2   No facility-administered medications prior to visit.      Review of Systems  Musculoskeletal: Negative for myalgias and neck pain.       Positive for left shoulder pain.   Neurological: Positive for  weakness. Negative for numbness.      Objective:    BP 128/74   Pulse 83   Temp 97.9 F (36.6 C) (Oral)   Ht 5' 9"  (1.753 m)   Wt 290 lb (131.5 kg)   SpO2 97%   BMI 42.83 kg/m  Nursing note and vital signs reviewed.  Physical Exam  Constitutional: He is oriented to person, place, and time. He appears well-developed and well-nourished. No distress.  Cardiovascular: Normal rate, regular rhythm, normal heart sounds and intact distal pulses.   Pulmonary/Chest: Effort normal and breath sounds normal.  Musculoskeletal:  Left arm - No obvious deformity, discoloration, or edema. There is tenderness over the subacromial space and subscapularis tendon. Range of motion limited to 90-120 degrees of flexion and abduction. Decreased strength in external rotation with positive empty can test. Distal pulses and sensation are intact and appropriate. Positive Neer's impingment and negative Hawkin's [place].   Neurological: He is alert and oriented to person, place, and time.  Skin: Skin is warm and dry.  Psychiatric: He has a normal mood and affect. His behavior is normal. Judgment and thought content normal.   Procedure: Corticosteroid injection of the left shoulder  Description: Informed consent was obtained with discussion including risks and benefits of the procedure. Patient verbally wished to continued. Time out was performed. The posterior lateral approach was identified and marked. It was cleansed with betadine using a concentric circular pattern. Skin anesthesia was applied using cold spray applied for 10 seconds. Injection of 21m :4 ml of Kenalog (40 mg / ml) to Sensoricaine was injected following aspiration with no return noted. Following the injection there was instant relief confirming appropriate placement. A bandage was applied to the area and post care instructions were provided. The procedure was tolerated well with no complications.      Assessment & Plan:   Problem List Items  Addressed This Visit      Other   B12 deficiency   Relevant Medications   cyanocobalamin ((VITAMIN B-12)) injection 1,000 mcg (Completed)   Pain of left upper extremity - Primary    Symptoms appear with increased localization over the left shoulder and subacromial space with concern for rotator cuff tendinopathy. Given no significant improvement, corticosteroid injection was provided without complication with improved symptoms minutes after injection. Continue conservative treatment with ice and home exercise therapy. Consider PT and MRI if symptoms worsen or do not improve.           I am having Mr. PDepuymaintain his LORazepam, Potassium Gluconate, fluticasone, gabapentin, ondansetron, traMADol, prochlorperazine, promethazine, gemfibrozil, L-Lysine, magnesium (amino acid chelate), docusate sodium, fexofenadine, canagliflozin, finasteride, albuterol, OXYGEN, Glycopyrrolate-Formoterol, irbesartan, Insulin Aspart (NOVOLOG FLEXPEN [place]), rifaximin, umeclidinium-vilanterol, bisoprolol, VENTOLIN HFA, sucralfate, fenofibrate, memantine, tamsulosin, meloxicam, glimepiride, TOVIAZ, Vitamin D3, clopidogrel, furosemide, liraglutide, insulin detemir, cyclobenzaprine, citalopram, pregabalin, and pantoprazole. We administered cyanocobalamin.   Meds ordered this encounter  Medications  . cyanocobalamin ((VITAMIN B-12)) injection 1,000 mcg     Follow-up: Return in about 1 month (around 09/28/2016), or if symptoms worsen or fail to improve.  CMauricio Po FNP

## 2016-08-29 NOTE — Assessment & Plan Note (Signed)
Symptoms appear with increased localization over the left shoulder and subacromial space with concern for rotator cuff tendinopathy. Given no significant improvement, corticosteroid injection was provided without complication with improved symptoms minutes after injection. Continue conservative treatment with ice and home exercise therapy. Consider PT and MRI if symptoms worsen or do not improve.

## 2016-08-30 DIAGNOSIS — N3020 Other chronic cystitis without hematuria: Secondary | ICD-10-CM | POA: Diagnosis not present

## 2016-08-30 DIAGNOSIS — R351 Nocturia: Secondary | ICD-10-CM | POA: Diagnosis not present

## 2016-08-30 DIAGNOSIS — N401 Enlarged prostate with lower urinary tract symptoms: Secondary | ICD-10-CM | POA: Diagnosis not present

## 2016-09-02 ENCOUNTER — Ambulatory Visit (HOSPITAL_BASED_OUTPATIENT_CLINIC_OR_DEPARTMENT_OTHER): Payer: Medicare HMO | Attending: Internal Medicine | Admitting: Pulmonary Disease

## 2016-09-02 DIAGNOSIS — G4733 Obstructive sleep apnea (adult) (pediatric): Secondary | ICD-10-CM | POA: Diagnosis present

## 2016-09-06 ENCOUNTER — Telehealth: Payer: Self-pay | Admitting: *Deleted

## 2016-09-06 ENCOUNTER — Telehealth: Payer: Self-pay

## 2016-09-06 DIAGNOSIS — M797 Fibromyalgia: Secondary | ICD-10-CM

## 2016-09-06 MED ORDER — LORAZEPAM 1 MG PO TABS: 1.0000 mg | ORAL_TABLET | Freq: Three times a day (TID) | ORAL | 0 refills | Status: DC | PRN

## 2016-09-06 MED ORDER — TRAMADOL HCL 50 MG PO TABS: 50.0000 mg | ORAL_TABLET | Freq: Three times a day (TID) | ORAL | 0 refills | Status: DC | PRN

## 2016-09-06 MED ORDER — GABAPENTIN 600 MG PO TABS: 1200.0000 mg | ORAL_TABLET | Freq: Three times a day (TID) | ORAL | 1 refills | Status: DC

## 2016-09-06 MED ORDER — PREGABALIN 75 MG PO CAPS: 75.0000 mg | ORAL_CAPSULE | Freq: Two times a day (BID) | ORAL | 0 refills | Status: DC

## 2016-09-06 NOTE — Telephone Encounter (Signed)
Medications dated for next refills and printed to be faxed.

## 2016-09-06 NOTE — Telephone Encounter (Signed)
Called pt no answer LMOM rx's faxed to [facility]...Bryan Becker

## 2016-09-06 NOTE — Telephone Encounter (Signed)
Left msg on triage stating he is needing refills sent to [facility] for Lyrica 75 mg, Lorazepam, Gabapentin, and Tramadol. Having prostate surgery tomorrow and wanting to pick meds up at [facility] today...Bryan Becker

## 2016-09-07 DIAGNOSIS — N401 Enlarged prostate with lower urinary tract symptoms: Secondary | ICD-10-CM | POA: Diagnosis not present

## 2016-09-07 DIAGNOSIS — R351 Nocturia: Secondary | ICD-10-CM | POA: Diagnosis not present

## 2016-09-09 ENCOUNTER — Other Ambulatory Visit: Payer: Self-pay

## 2016-09-09 MED ORDER — MEMANTINE HCL 10 MG PO TABS: 10.0000 mg | ORAL_TABLET | Freq: Two times a day (BID) | ORAL | 0 refills | Status: DC

## 2016-09-09 MED ORDER — GLIMEPIRIDE 2 MG PO TABS: 2.0000 mg | ORAL_TABLET | Freq: Every day | ORAL | 0 refills | Status: DC

## 2016-09-09 MED ORDER — INSULIN DETEMIR 100 UNIT/ML ~~LOC~~ SOLN: 60.0000 [IU] | Freq: Every day | SUBCUTANEOUS | 3 refills | Status: DC

## 2016-09-12 ENCOUNTER — Telehealth: Payer: Self-pay | Admitting: Family

## 2016-09-12 ENCOUNTER — Other Ambulatory Visit: Payer: Self-pay | Admitting: Family

## 2016-09-12 DIAGNOSIS — M797 Fibromyalgia: Secondary | ICD-10-CM

## 2016-09-12 DIAGNOSIS — M79602 Pain in left arm: Secondary | ICD-10-CM

## 2016-09-12 NOTE — Telephone Encounter (Signed)
Pt medication traMADol (ULTRAM) 50 MG tablet   was sent to [facility] please resend to.   [facility] in [place]

## 2016-09-13 MED ORDER — PREGABALIN 75 MG PO CAPS: 75.0000 mg | ORAL_CAPSULE | Freq: Two times a day (BID) | ORAL | 0 refills | Status: DC

## 2016-09-13 MED ORDER — TRAMADOL HCL 50 MG PO TABS: 50.0000 mg | ORAL_TABLET | Freq: Three times a day (TID) | ORAL | 0 refills | Status: DC | PRN

## 2016-09-13 MED ORDER — TRAMADOL HCL 50 MG PO TABS
50.0000 mg | ORAL_TABLET | Freq: Three times a day (TID) | ORAL | 0 refills | Status: DC | PRN
Start: 2016-09-13 — End: 2016-09-16

## 2016-09-13 NOTE — Telephone Encounter (Signed)
Also received a fax from [facility] for refill of lyrica. Last refill was 08/15/16 per [place] controlled substance database. Please advise

## 2016-09-13 NOTE — Telephone Encounter (Signed)
Please advise. Last refill according to [place] controlled substance database is 08/02/16

## 2016-09-13 NOTE — Telephone Encounter (Signed)
Medications refilled

## 2016-09-14 ENCOUNTER — Telehealth: Payer: Self-pay | Admitting: Internal Medicine

## 2016-09-14 ENCOUNTER — Other Ambulatory Visit: Payer: Self-pay | Admitting: *Deleted

## 2016-09-14 ENCOUNTER — Ambulatory Visit (INDEPENDENT_AMBULATORY_CARE_PROVIDER_SITE_OTHER): Payer: Medicare HMO

## 2016-09-14 ENCOUNTER — Other Ambulatory Visit: Payer: Self-pay | Admitting: Family

## 2016-09-14 ENCOUNTER — Other Ambulatory Visit: Payer: Self-pay

## 2016-09-14 DIAGNOSIS — M797 Fibromyalgia: Secondary | ICD-10-CM

## 2016-09-14 DIAGNOSIS — E538 Deficiency of other specified B group vitamins: Secondary | ICD-10-CM

## 2016-09-14 DIAGNOSIS — M79602 Pain in left arm: Secondary | ICD-10-CM

## 2016-09-14 DIAGNOSIS — G4733 Obstructive sleep apnea (adult) (pediatric): Secondary | ICD-10-CM

## 2016-09-14 MED ORDER — CYCLOBENZAPRINE HCL 10 MG PO TABS: 10.0000 mg | ORAL_TABLET | Freq: Three times a day (TID) | ORAL | 0 refills | Status: DC | PRN

## 2016-09-14 MED ORDER — INSULIN ASPART 100 UNIT/ML FLEXPEN: 20.0000 [IU] | PEN_INJECTOR | Freq: Four times a day (QID) | SUBCUTANEOUS | 1 refills | Status: DC

## 2016-09-14 MED ORDER — CYANOCOBALAMIN 1000 MCG/ML IJ SOLN
1000.0000 ug | Freq: Once | INTRAMUSCULAR | Status: AC
  Administered 2016-09-14: 1000 ug via INTRAMUSCULAR

## 2016-09-14 MED ORDER — GABAPENTIN 600 MG PO TABS: 1200.0000 mg | ORAL_TABLET | Freq: Three times a day (TID) | ORAL | 1 refills | Status: DC

## 2016-09-14 NOTE — Telephone Encounter (Signed)
LMTCB

## 2016-09-14 NOTE — Telephone Encounter (Signed)
Pt aware.

## 2016-09-14 NOTE — Telephone Encounter (Signed)
Pt calling for sleep study  MW, please advise if you have seen anything on this yet, thanks

## 2016-09-14 NOTE — Telephone Encounter (Signed)
Listed as still open on the encounter so has not been read yet and will contact him when it is

## 2016-09-14 NOTE — Telephone Encounter (Signed)
Duplicate request flexeril already been sent...Bryan Becker

## 2016-09-14 NOTE — Telephone Encounter (Signed)
Medications have been faxed.

## 2016-09-15 ENCOUNTER — Telehealth: Payer: Self-pay | Admitting: Internal Medicine

## 2016-09-15 ENCOUNTER — Telehealth: Payer: Self-pay

## 2016-09-15 DIAGNOSIS — N3020 Other chronic cystitis without hematuria: Secondary | ICD-10-CM | POA: Diagnosis not present

## 2016-09-15 DIAGNOSIS — N401 Enlarged prostate with lower urinary tract symptoms: Secondary | ICD-10-CM | POA: Diagnosis not present

## 2016-09-15 DIAGNOSIS — N318 Other neuromuscular dysfunction of bladder: Secondary | ICD-10-CM | POA: Diagnosis not present

## 2016-09-15 DIAGNOSIS — R351 Nocturia: Secondary | ICD-10-CM | POA: Diagnosis not present

## 2016-09-15 NOTE — Telephone Encounter (Signed)
RF rq for lorazepam and tramadol to [facility].

## 2016-09-15 NOTE — Telephone Encounter (Signed)
I informed patient that he needs to contact pulmonary for this.

## 2016-09-16 MED ORDER — LORAZEPAM 1 MG PO TABS
1.0000 mg | ORAL_TABLET | Freq: Three times a day (TID) | ORAL | 0 refills | Status: DC | PRN
Start: 2016-09-16 — End: 2016-10-11

## 2016-09-16 MED ORDER — TRAMADOL HCL 50 MG PO TABS: 50.0000 mg | ORAL_TABLET | Freq: Three times a day (TID) | ORAL | 0 refills | Status: DC | PRN

## 2016-09-16 NOTE — Telephone Encounter (Signed)
Medications printed to be faxed.

## 2016-09-18 DIAGNOSIS — G4733 Obstructive sleep apnea (adult) (pediatric): Secondary | ICD-10-CM

## 2016-09-18 NOTE — Procedures (Signed)
    Patient Name: Bryan Becker, Bryan Becker Date: 09/02/2016   Gender: Male  D.O.B: 03-Apr-1961  Age (years): 42  Referring Provider: Tanda Rockers   Height (inches): 43  Interpreting Physician: Chesley Mires MD, ABSM  Weight (lbs): 290  RPSGT: Zadie Rhine   BMI: 43  MRN: [id number]  Neck Size: 19.00    CLINICAL INFORMATION  The patient is referred for a CPAP titration to treat sleep apnea. Date of Split Night Study: 01/04/16, AHI 26.5.  SLEEP STUDY TECHNIQUE  As per the AASM Manual for the Scoring of Sleep and Associated Events v2.3 (April 2016) with a hypopnea requiring 4% desaturations.  The channels recorded and monitored were frontal, central and occipital EEG, electrooculogram (EOG), submentalis EMG (chin), nasal and oral airflow, thoracic and abdominal wall motion, anterior tibialis EMG, snore microphone, electrocardiogram, and pulse oximetry. Continuous positive airway pressure (CPAP) was initiated at the beginning of the study and titrated to treat sleep-disordered breathing. MEDICATIONS  Medications self-administered by patient taken the night of the study : N/A  TECHNICIAN COMMENTS  Comments added by technician: NO RESTROOM VISTED  Comments added by scorer: N/A  RESPIRATORY PARAMETERS  Optimal PAP Pressure (cm): 18 AHI at Optimal Pressure (/hr): 0.0  Overall Minimal O2 (%): 78.00 Supine % at Optimal Pressure (%): 100  Minimal O2 at Optimal Pressure (%): 87.0    SLEEP ARCHITECTURE  The study was initiated at 10:26:12 PM and ended at 4:30:52 AM.  Sleep onset time was 0.4 minutes and the sleep efficiency was 98.9%. The total sleep time was 360.5 minutes.  The patient spent 0.55% of the night in stage N1 sleep, 78.50% in stage N2 sleep, 0.69% in stage N3 and 20.25% in REM.Stage REM latency was 40.5 minutes  Wake after sleep onset was 3.8. Alpha intrusion was absent. Supine sleep was 100.00%. CARDIAC DATA  The 2 lead EKG demonstrated sinus rhythm. The mean heart rate was  79.63 beats per minute. Other EKG findings include: None.  LEG MOVEMENT DATA  The total Periodic Limb Movements of Sleep (PLMS) were 0. The PLMS index was 0.00. A PLMS index of <15 is considered normal in adults. IMPRESSIONS  - The optimal PAP pressure was 18 cm of water.  - He did not require supplemental oxygen during this study.  - He did not require the use of Bipap during this study. DIAGNOSIS  - Obstructive Sleep Apnea (327.23 [G47.33 ICD-10]) RECOMMENDATIONS  - Trial of CPAP therapy on 18 cm H2O.  - He was fitted with a Small size Resmed Full Face Mask AirFit F20 mask and heated humidification.  - If he has difficulty tolerating relative high CPAP settings, then he might need repeat titration study using Bipap.  [Electronically signed] 09/18/2016 11:45 PM Chesley Mires MD, [place], American Board of Sleep Medicine  NPI: [id number]

## 2016-09-19 ENCOUNTER — Other Ambulatory Visit (INDEPENDENT_AMBULATORY_CARE_PROVIDER_SITE_OTHER): Payer: Medicare HMO

## 2016-09-19 ENCOUNTER — Encounter: Payer: Self-pay | Admitting: Family

## 2016-09-19 ENCOUNTER — Ambulatory Visit (INDEPENDENT_AMBULATORY_CARE_PROVIDER_SITE_OTHER): Payer: Medicare HMO | Admitting: Family

## 2016-09-19 VITALS — BP 154/82 | HR 80 | Temp 98.7°F | Resp 20 | Ht 69.0 in | Wt 288.0 lb

## 2016-09-19 DIAGNOSIS — R531 Weakness: Secondary | ICD-10-CM

## 2016-09-19 DIAGNOSIS — Z79899 Other long term (current) drug therapy: Secondary | ICD-10-CM

## 2016-09-19 DIAGNOSIS — E538 Deficiency of other specified B group vitamins: Secondary | ICD-10-CM

## 2016-09-19 DIAGNOSIS — R413 Other amnesia: Secondary | ICD-10-CM

## 2016-09-19 LAB — TSH: TSH: 0.96 u[IU]/mL (ref 0.35–4.50)

## 2016-09-19 LAB — VITAMIN B12: Vitamin B-12: >1500 pg/mL — ABNORMAL HIGH (ref 211–911)

## 2016-09-19 MED ORDER — BLOOD GLUCOSE MONITOR KIT: PACK | 0 refills | Status: AC

## 2016-09-19 MED ORDER — CYANOCOBALAMIN 1000 MCG/ML IJ SOLN
1000.0000 ug | Freq: Once | INTRAMUSCULAR | Status: AC
  Administered 2016-09-19: 1000 ug via INTRAMUSCULAR

## 2016-09-19 NOTE — Assessment & Plan Note (Signed)
Currently maintained on Namenda with question continued need of medication at this time. Refer to neurology for further assessment of memory and continued need of Namenda.

## 2016-09-19 NOTE — Telephone Encounter (Signed)
lmtcb X1 for pt. Pt needs to be scheduled next available sleep consult with [facility]. Will place order for cpap after speaking to pt.

## 2016-09-19 NOTE — Assessment & Plan Note (Addendum)
Continued weakness and fatigue with new onset syncopal episode. Several differentials of concern including polypharmacy, COPD and dehydration. Unlikely cardiac related given symptoms with previous GI work up being negative. Recommend weaning gabapentin and avoiding sedating medications as there are several with side effects of weakness. Encouraged to drink plenty of fluids as he is on furosemide. Refer to neurology for memory impairment and determined continued need for Namenda. Obtain TSH and B12. Follow up pending weaning of gabapentin or if symptoms worsen or do not improve.

## 2016-09-19 NOTE — Telephone Encounter (Signed)
Patient returned phone call.Erica R Taylor ° °

## 2016-09-19 NOTE — Telephone Encounter (Signed)
Tanda Rockers, MD sent to Rosana Berger, CMA        Let him know he def has sleep apnea and needsp cpapa   rec  - Trial of CPAP therapy on 18 cm H2O.  - He was fitted with a Small size Resmed Full Face Mask AirFit F20 mask and heated humidification.   f/u with [facility] first avail for new sleep consult     LMTCB

## 2016-09-19 NOTE — Patient Instructions (Addendum)
Thank you for choosing [facility].  SUMMARY AND INSTRUCTIONS:  Please start to wean gabapentin. Decrease by 600 every 5 days to goal of 1 tablet by mouth 3x daily.  They will call with your referral to neurology.  Continue to drink plenty of fluids.   Decrease use of sedating medications as much as possible.  We will check your B12 and thyroid today. All other previous blood work and imaging have been normal.  Medication:   Your prescription(s) have been submitted to your pharmacy or been printed and provided for you. Please take as directed and contact our office if you believe you are having problem(s) with the medication(s) or have any questions.  Labs:  Please stop by the lab on the lower level of the building for your blood work. Your results will be released to [place] (or called to you) after review, usually within 72 hours after test completion. If any changes need to be made, you will be notified at that same time.  1.) The lab is open from 7:30am to 5:30 pm Monday-Friday 2.) No appointment is necessary 3.) Fasting (if needed) is 6-8 hours after food and drink; black coffee and water are okay   Follow up:  If your symptoms worsen or fail to improve, please contact our office for further instruction, or in case of emergency go directly to the emergency room at the closest medical facility.

## 2016-09-19 NOTE — Progress Notes (Signed)
Subjective:    Patient ID: Bryan Becker, male    DOB: 09/28/1960, 56 y.o.   MRN: [id number]  Chief Complaint  Patient presents with  . Follow-up    states he had an episode of maybe passing out, does not know he was at [place] and ended up home, tried and weak    HPI:  Bryan Becker is a 56 y.o. male who  has a past medical history of Anxiety; Arthritis; Asthma; CHF (congestive heart failure) ([place]); Chronic back pain; Chronic bronchitis ([place]); Cirrhosis ([place]); Confusion; COPD (chronic obstructive pulmonary disease) ([place]); Dementia; Dementia; Depression; Fall at home (06/04/14); Family history of adverse reaction to anesthesia; Fibromyalgia; Frequent urination; Gastric ulcer; GERD (gastroesophageal reflux disease); Headache(784.0); Hyperlipidemia; Hypertension; Kidney stone; Myocardial infarction [facility]); Nausea & vomiting; OSA (obstructive sleep apnea) (04/24/2013); Pancreatitis; Pneumonia; RMSF [facility] spotted fever) (11/22/2012); Seasonal allergies; Shortness of breath dyspnea; Stroke [facility]) (03/2013); Stroke [facility]) (12/04/2013); and Type II diabetes mellitus ([place]). and presents today for a follow up office visit.   Syncope - Recently experienced an episode of syncope. Recalls going to get pizza and the next thing that he remembers is being home. No further episodes of syncope or passing out since. Describes feeling the associated symptoms of feeling tired and weak since the event. Most recent 2D echocardiogram was normal with EF of 60-65% with normal wall motion. Previous work-up for diarrhea and gastrointestinal bleed were normal with no current abdominal pain or blood in his stool. His blood sugars were normal at the time and have been with improved control with the current medication regimen and no hypoglycemia. Denies any seizures. Reports drinking plenty of fluids averaging about 64 ounce per day. He does continue to be furosemide.   Allergies  Allergen Reactions  . Actos  [Pioglitazone] Shortness Of Breath    Caused patient to be in CHF  . Aricept [Donepezil Hcl] Diarrhea      Outpatient Medications Prior to Visit  Medication Sig Dispense Refill  . albuterol (ACCUNEB) 1.25 MG/3ML nebulizer solution Take 1 ampule by nebulization every 6 (six) hours as needed for wheezing.    . bisoprolol (ZEBETA) 5 MG tablet One twice daily (Patient taking differently: Take 5 mg by mouth 2 (two) times daily. ) 180 tablet 3  . Cholecalciferol (VITAMIN D3) 50000 units CAPS Take 50,000 Units by mouth once a week. 8 capsule 0  . citalopram (CELEXA) 40 MG tablet Take 1 tablet (40 mg total) by mouth daily. 90 tablet 0  . clopidogrel (PLAVIX) 75 MG tablet Take 1 tablet (75 mg total) by mouth daily. Restart on 07/11/2016    . cyclobenzaprine (FLEXERIL) 10 MG tablet Take 1 tablet (10 mg total) by mouth 3 (three) times daily as needed for muscle spasms. 90 tablet 0  . docusate sodium (COLACE) 100 MG capsule Take 100 mg by mouth daily as needed for mild constipation.    . fenofibrate 160 MG tablet Take 160 mg by mouth daily.    . fexofenadine (ALLEGRA) 180 MG tablet Take 180 mg by mouth daily.    . finasteride (PROSCAR) 5 MG tablet Take 5 mg by mouth daily.    . fluticasone (FLONASE) 50 MCG/ACT nasal spray Place 2 sprays into both nostrils daily as needed for allergies.     . furosemide (LASIX) 40 MG tablet TAKE 1 TO 2 TABLETS TWICE DAILY AS DIRECTED 360 tablet 2  . gabapentin (NEURONTIN) 600 MG tablet Take 2 tablets (1,200 mg total) by mouth 3 (three)  times daily. 540 tablet 1  . glimepiride (AMARYL) 2 MG tablet Take 1 tablet (2 mg total) by mouth daily. 90 tablet 0  . insulin aspart (NOVOLOG FLEXPEN) 100 UNIT/ML FlexPen Inject 20-30 Units into the skin 4 (four) times daily. 45 mL 1  . insulin detemir (LEVEMIR) 100 UNIT/ML injection Inject 0.6 mLs (60 Units total) into the skin at bedtime. 30 mL 3  . irbesartan (AVAPRO) 300 MG tablet Take 1 tablet (300 mg total) by mouth daily. (Patient  taking differently: Take 300 mg by mouth 2 (two) times daily. ) 90 tablet 1  . L-Lysine 1000 MG TABS Take 1,000 mg by mouth daily as needed (for fever blisters).    . liraglutide 18 MG/3ML SOPN Inject 0.3 mLs (1.8 mg total) into the skin daily. 27 mL 3  . LORazepam (ATIVAN) 1 MG tablet Take 1 tablet (1 mg total) by mouth every 8 (eight) hours as needed for anxiety. 90 tablet 0  . memantine (NAMENDA) 10 MG tablet Take 1 tablet (10 mg total) by mouth 2 (two) times daily. 180 tablet 0  . OXYGEN 2lpm with sleep and occ as needed during the day [employer]    . pantoprazole (PROTONIX) 40 MG tablet Take 1 tablet (40 mg total) by mouth daily. 90 tablet 0  . Potassium Gluconate 595 MG CAPS Take 2 capsules by mouth daily.    . pregabalin (LYRICA) 75 MG capsule Take 1 capsule (75 mg total) by mouth 2 (two) times daily. 60 capsule 0  . promethazine (PHENERGAN) 25 MG tablet Take 1 tablet (25 mg total) by mouth every 6 (six) hours as needed for nausea or vomiting. 15 tablet 0  . Specialty Vitamins Products (MAGNESIUM, AMINO ACID CHELATE,) 133 MG tablet Take 1 tablet by mouth 2 (two) times daily.    . sucralfate (CARAFATE) 1 g tablet Take 1 g by mouth 4 (four) times daily -  with meals and at bedtime.    . traMADol (ULTRAM) 50 MG tablet Take 1 tablet (50 mg total) by mouth every 8 (eight) hours as needed for moderate pain. 60 tablet 0  . umeclidinium-vilanterol (ANORO ELLIPTA) 62.5-25 MCG/INH AEPB Inhale 1 puff into the lungs daily. 3 each 3  . VENTOLIN HFA 108 (90 Base) MCG/ACT inhaler INHALE 2 PUFFS EVERY 6 HOURS AS NEEDED 54 g 2  . canagliflozin (INVOKANA) 100 MG TABS tablet Take 100 mg by mouth.    Marland Kitchen gemfibrozil (LOPID) 600 MG tablet Take 600 mg by mouth daily.    . Glycopyrrolate-Formoterol (BEVESPI AEROSPHERE) 9-4.8 MCG/ACT AERO Inhale 2 puffs into the lungs 2 (two) times daily. 1 Inhaler 4  . meloxicam (MOBIC) 7.5 MG tablet Take 7.5 mg by mouth daily.    . ondansetron (ZOFRAN) 4 MG tablet Take 1 tablet (4  mg total) by mouth every 8 (eight) hours as needed for nausea or vomiting. 20 tablet 0  . prochlorperazine (COMPAZINE) 25 MG suppository Place 1 suppository (25 mg total) rectally every 12 (twelve) hours as needed for nausea or vomiting. 12 suppository 0  . rifaximin (XIFAXAN) 550 MG TABS tablet Take 1 tablet (550 mg total) by mouth 3 (three) times daily. 42 tablet 0  . TOVIAZ 4 MG TB24 tablet Take 4 mg by mouth daily.    . tamsulosin (FLOMAX) 0.4 MG CAPS capsule Take 0.4 mg by mouth daily.     No facility-administered medications prior to visit.       Past Surgical History:  Procedure Laterality Date  . CARDIAC  CATHETERIZATION     10/28/05: right dominant, normal LV systolic function, normal coronaries. (Dr. Baxter Hire, [facility])  . CHOLECYSTECTOMY    . COLONOSCOPY    . COLONOSCOPY WITH PROPOFOL N/A 07/08/2016   Procedure: COLONOSCOPY WITH PROPOFOL;  Surgeon: Gatha Mayer, MD;  Location: [facility];  Service: Endoscopy;  Laterality: N/A;  . CYSTOSCOPY W/ STONE MANIPULATION    . LUMBAR LAMINECTOMY/DECOMPRESSION MICRODISCECTOMY Left 06/18/2014   Procedure: LUMBAR LAMINECTOMY/DECOMPRESSION MICRODISCECTOMY LEFT LUMBAR FIVE-SACRAL ONE;  Surgeon: Ashok Pall, MD;  Location: [place] NEURO ORS;  Service: Neurosurgery;  Laterality: Left;  left  . multiple cysts removal-hip,wrist    . TONSILLECTOMY  ~ 1985  . UPPER GASTROINTESTINAL ENDOSCOPY        Past Medical History:  Diagnosis Date  . Anxiety   . Arthritis    "joints"  . Asthma   . CHF (congestive heart failure) ([place])   . Chronic back pain    "mostly lower; have 2 bulging discs in my neck" (12/05/2013)  . Chronic bronchitis ([place])    "get it q yr"  . 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 Confusion   . COPD (chronic obstructive pulmonary disease) ([place])   . Dementia    onset of early  . Dementia   . Depression   . Fall at home 06/04/14   Pt stated he falls off the bed at nightly  . Family history of adverse  reaction to anesthesia    Patients mother had a bad reaction to Anesthesia however pt is unaware of reaction  . Fibromyalgia   . Frequent urination   . Gastric ulcer   . GERD (gastroesophageal reflux disease)   . KPTWSFKC(127.5)    "weekly" (12/04/2013)  . Hyperlipidemia   . Hypertension   . Kidney stone   . Myocardial infarction [facility])    unsure about MI history, but had normal cath that admission '07 ([facility])  . Nausea & vomiting   . OSA (obstructive sleep apnea) 04/24/2013   "use BiPAP"  . Pancreatitis   . Pneumonia    "several times"  . RMSF [facility] spotted fever) 11/22/2012  . Seasonal allergies   . Shortness of breath dyspnea    with ambulation  . Stroke [facility]) 03/2013   "still weak on the left side" (12/05/2013)  . Stroke [facility]) 12/04/2013   "came in today w/right sided weakness" (12/04/2013)  . Type II diabetes mellitus ([place])       Review of Systems  Constitutional: Positive for fatigue. Negative for chills and fever.  HENT: Negative for congestion.   Respiratory: Negative for chest tightness, shortness of breath and wheezing.   Cardiovascular: Negative for chest pain, palpitations and leg swelling.  Gastrointestinal: Negative for abdominal pain and blood in stool.  Neurological: Positive for syncope and weakness.      Objective:    BP (!) 154/82 (BP Location: Left Arm, Patient Position: Sitting, Cuff Size: Large)   Pulse 80   Temp 98.7 F (37.1 C) (Oral)   Resp 20   Ht _0  (1.753 m)   Wt 288 lb (130.6 kg)   SpO2 98%   BMI 42.53 kg/m  Nursing note and vital signs reviewed.  Physical Exam  Constitutional: He is oriented to person, place, and time. He appears well-developed and well-nourished. No distress.  Cardiovascular: Normal rate, regular rhythm and intact distal pulses.  Exam reveals no gallop and no friction rub.   No murmur heard. Pulmonary/Chest: Effort normal and  breath sounds normal. No respiratory distress. He has no wheezes. He has no rales.  He exhibits no tenderness.  Neurological: He is alert and oriented to person, place, and time.  Skin: Skin is warm and dry.  Psychiatric: He has a normal mood and affect. His behavior is normal. Judgment and thought content normal.       Assessment & Plan:   Problem List Items Addressed This Visit      Other   Weakness - Primary    Continued weakness and fatigue with new onset syncopal episode. Several differentials of concern including polypharmacy, COPD and dehydration. Unlikely cardiac related given symptoms with previous GI work up being negative. Recommend weaning gabapentin and avoiding sedating medications as there are several with side effects of weakness. Encouraged to drink plenty of fluids as he is on furosemide. Refer to neurology for memory impairment and determined continued need for Namenda. Obtain TSH and B12. Follow up pending weaning of gabapentin or if symptoms worsen or do not improve.       Relevant Orders   TSH   Ambulatory referral to Neurology   B12 deficiency    B12 improving with current supplementation. Injection provided today without complication and tolerated well. Continue to monitor.       Relevant Medications   cyanocobalamin ((VITAMIN B-12)) injection 1,000 mcg (Completed)   Other Relevant Orders   B12   Memory difficulties    Currently maintained on Namenda with question continued need of medication at this time. Refer to neurology for further assessment of memory and continued need of Namenda.       Relevant Orders   Ambulatory referral to Neurology   Polypharmacy    Medications reviewed and updated to reflect current medications. Concern for interactions between medications. Goal is to wean un-necessary medications and will start with gabapentin. Continue to monitor.           I have discontinued Bryan Becker's ondansetron, prochlorperazine, gemfibrozil, canagliflozin, Glycopyrrolate-Formoterol, rifaximin, tamsulosin, meloxicam, and TOVIAZ.  I am also having him start on blood glucose meter kit and supplies. Additionally, I am having him maintain his Potassium Gluconate, fluticasone, promethazine, L-Lysine, magnesium (amino acid chelate), docusate sodium, fexofenadine, finasteride, albuterol, OXYGEN, irbesartan, umeclidinium-vilanterol, bisoprolol, VENTOLIN HFA, sucralfate, fenofibrate, Vitamin D3, clopidogrel, furosemide, liraglutide, citalopram, pantoprazole, glimepiride, insulin detemir, memantine, pregabalin, cyclobenzaprine, insulin aspart, gabapentin, traMADol, and LORazepam. We administered cyanocobalamin.   Meds ordered this encounter  Medications  . cyanocobalamin ((VITAMIN B-12)) injection 1,000 mcg  . blood glucose meter kit and supplies KIT    Sig: Dispense based on patient and insurance preference. Use up to four times daily as directed. Dx Code: E11.65    Dispense:  1 each    Refill:  0    Order Specific Question:   Number of strips    Answer:   100    Order Specific Question:   Number of lancets    Answer:   100     Follow-up: Return if symptoms worsen or fail to improve.  Mauricio Po, FNP

## 2016-09-19 NOTE — Assessment & Plan Note (Signed)
Medications reviewed and updated to reflect current medications. Concern for interactions between medications. Goal is to wean un-necessary medications and will start with gabapentin. Continue to monitor.

## 2016-09-19 NOTE — Assessment & Plan Note (Signed)
B12 improving with current supplementation. Injection provided today without complication and tolerated well. Continue to monitor.

## 2016-09-20 ENCOUNTER — Encounter: Payer: Self-pay | Admitting: Neurology

## 2016-09-22 NOTE — Telephone Encounter (Signed)
Called and spoke with ptt and he is aware of MW recs. He has a pending appt with VS in July and pt wanted to make sure that the order would be placed with [facility] so he can go ahead and get set up with his machine.  MW please advise.  Pt does not need a call back unless the machine will not be set up.  thanks

## 2016-09-22 NOTE — Telephone Encounter (Signed)
Scroll to the bottom of the message and you will find my original orders which should not need anything further from me to implement as per dr Juanetta Gosling instructions

## 2016-09-22 NOTE — Telephone Encounter (Signed)
Aware but we can only go by what the sleep specialist recommends based on the study and be sure he keeps appt with the sleep doc as rec and discuss this then in large part based on how he does in meantime following the recs that were made

## 2016-09-22 NOTE — Telephone Encounter (Signed)
Order for cpap machine placed.  Nothing further needed.

## 2016-09-22 NOTE — Telephone Encounter (Signed)
Patient returned call, scheduled sleep consult with VS on 12/07/2016.  He is aware that we will be calling him back about CPAP order. CB is [phone].

## 2016-09-22 NOTE — Telephone Encounter (Signed)
Called and spoke to pt. Informed him of the results per MW. Pt verbalized understanding but states before we order CPAP he wanted MW to be aware that he has been using BiPAP for the past 7-8 years and has only been without it since March 2018 d/t the machine not working.   MW - still ok to order CPAP? Thanks.

## 2016-09-23 ENCOUNTER — Ambulatory Visit: Payer: Medicare HMO | Admitting: Neurology

## 2016-09-27 ENCOUNTER — Telehealth: Payer: Self-pay | Admitting: Internal Medicine

## 2016-09-27 NOTE — Telephone Encounter (Signed)
Spoke with Melissa  She states in order for pt get get CPAP we need to have MW addend the last ov note to state "pt using PAP therapy an benefiting from it" Also, she is concerned that we did not order BIPAP and ordered CPAP instead  Pt has been on BIPAP for years  Please advise if order needs to be changed and if you will addend note  Thanks

## 2016-09-28 NOTE — Telephone Encounter (Signed)
Melissa returned phone call; states not under any time frame with pass referral, but can't provide the patient with replacement unit until the pt see VS...ert

## 2016-09-28 NOTE — Telephone Encounter (Signed)
Melissa returned call, CB is [phone].

## 2016-09-28 NOTE — Telephone Encounter (Signed)
lmtcb X1 for Melissa at [facility] to make aware of MW's recs. Per 09/14/16 phone note, pt has already been deferred to sleep specialist in office by MW, has an upcoming appt with VS in July.

## 2016-09-28 NOTE — Telephone Encounter (Signed)
lmom tcb x1  Bryan Becker gave the ok for pt to come in on 10/14/16 at 11:30am to [facility] for a sooner sleep consult    Spoke with Melissa in the office today, she stated July's appt is too far out for pt to be able to keep him in compliance with his insurance.  She states he will need a sooner appt and then we can go from there to discuss pt getting a new machine

## 2016-09-28 NOTE — Telephone Encounter (Signed)
Spoke with Melissa and informed her of MW's message. She stated she is afraid that pt may be out of the window if we wait till his follow up appt with VS. She states she will look into things and then call us back if pt's appt needs to be moved sooner

## 2016-09-28 NOTE — Telephone Encounter (Signed)
He was on bipap per [facility] doc from 5 years ago but I'm not sure he was either using it or benefitting from it  and wasn't using cpap when I saw him but that is what Dr Halford Chessman rec on basis of new  split night study he read.  So I'm not sure what they need here:  This is a new start for cpap   Should be listed as first available new pt for Dr Halford Chessman and he should stay on his old equipment in meatime if [facility] needs more documentation

## 2016-09-30 NOTE — Telephone Encounter (Signed)
lmtcb x2 for pt. 

## 2016-10-03 NOTE — Telephone Encounter (Signed)
Called and spoke with pt and appt has been scheduled for him for 5/24 at 4;30 and pt is aware of this appt.

## 2016-10-04 ENCOUNTER — Ambulatory Visit (INDEPENDENT_AMBULATORY_CARE_PROVIDER_SITE_OTHER): Payer: Medicare HMO | Admitting: Family

## 2016-10-04 ENCOUNTER — Encounter: Payer: Self-pay | Admitting: Family

## 2016-10-04 ENCOUNTER — Ambulatory Visit (INDEPENDENT_AMBULATORY_CARE_PROVIDER_SITE_OTHER)
Admission: RE | Admit: 2016-10-04 | Discharge: 2016-10-04 | Disposition: A | Discharge status: Home / Self Care | Payer: Medicare HMO | Source: Ambulatory Visit | Attending: Family | Admitting: Family

## 2016-10-04 VITALS — BP 144/86 | HR 94 | Temp 98.1°F | Resp 18 | Ht 69.0 in | Wt 289.0 lb

## 2016-10-04 DIAGNOSIS — M79602 Pain in left arm: Secondary | ICD-10-CM | POA: Diagnosis not present

## 2016-10-04 DIAGNOSIS — Z794 Long term (current) use of insulin: Secondary | ICD-10-CM | POA: Diagnosis not present

## 2016-10-04 DIAGNOSIS — IMO0002 Reserved for concepts with insufficient information to code with codable children: Secondary | ICD-10-CM

## 2016-10-04 DIAGNOSIS — E1165 Type 2 diabetes mellitus with hyperglycemia: Secondary | ICD-10-CM | POA: Diagnosis not present

## 2016-10-04 DIAGNOSIS — E1142 Type 2 diabetes mellitus with diabetic polyneuropathy: Secondary | ICD-10-CM | POA: Diagnosis not present

## 2016-10-04 LAB — POCT GLYCOSYLATED HEMOGLOBIN (HGB A1C): HEMOGLOBIN A1C: 9.3

## 2016-10-04 NOTE — Assessment & Plan Note (Signed)
Symptoms and exam remain consistent with possible rotator cuff pathology that is refractory to cortisone injection. Continue with conservative treatment of ice and home exercise therapy. Refer to orthopedics for further assessment and treatment. Obtain x-rays to rule out fracture or possible calcific tendinitis. Continue to monitor.

## 2016-10-04 NOTE — Assessment & Plan Note (Signed)
In office hemoglobin A1c of 9.3 which is improved from 10.1. Increase Levemir to 95 units. Continue current dosage of glimepiride and NovoLog. Encouraged follow a low/modified carbohydrate diet. Continue to monitor.

## 2016-10-04 NOTE — Patient Instructions (Signed)
Thank you for choosing [facility].  SUMMARY AND INSTRUCTIONS:  Please continue to take your medications as prescribed.   We will get an x-ray today.  They will call with your referral to orthopedics.   Imaging / Radiology:  Please stop by radiology on the basement level of the building for your x-rays. Your results will be released to [facility] (or called to you) after review, usually within 72 hours after test completion. If any treatments or changes are necessary, you will be notified at that same time.  Follow up:  If your symptoms worsen or fail to improve, please contact our office for further instruction, or in case of emergency go directly to the emergency room at the closest medical facility.

## 2016-10-04 NOTE — Progress Notes (Signed)
Subjective:    Patient ID: Bryan Becker, male    DOB: 1961-05-07, 56 y.o.   MRN: [id number]  Chief Complaint  Patient presents with  . Arm Pain    left arm pain, limited ROM, thumb on left hand is bothering him     HPI:  Bryan Becker is a 56 y.o. male who  has a past medical history of Anxiety; Arthritis; Asthma; CHF (congestive heart failure) ([place]); Chronic back pain; Chronic bronchitis ([place]); Cirrhosis ([place]); Confusion; COPD (chronic obstructive pulmonary disease) ([place]); Dementia; Dementia; Depression; Fall at home (06/04/14); Family history of adverse reaction to anesthesia; Fibromyalgia; Frequent urination; Gastric ulcer; GERD (gastroesophageal reflux disease); Headache(784.0); Hyperlipidemia; Hypertension; Kidney stone; Myocardial infarction [facility]); Nausea & vomiting; OSA (obstructive sleep apnea) (04/24/2013); Pancreatitis; Pneumonia; RMSF [facility] spotted fever) (11/22/2012); Seasonal allergies; Shortness of breath dyspnea; Stroke [facility]) (03/2013); Stroke [facility]) (12/04/2013); and Type II diabetes mellitus ([place]). and presents today for a follow up office visit.   Continues to experience the associated symptoms of left arm pain that has been refractory to a cortisone injection. Notes decreased range of motion above 90 degrees secondary to pain. Denies any new injury or trauma. No neck pain. Modifying factors include splinting it next to his side which makes it feel better.    Allergies  Allergen Reactions  . Actos [Pioglitazone] Shortness Of Breath    Caused patient to be in CHF  . Aricept [Donepezil Hcl] Diarrhea      Outpatient Medications Prior to Visit  Medication Sig Dispense Refill  . albuterol (ACCUNEB) 1.25 MG/3ML nebulizer solution Take 1 ampule by nebulization every 6 (six) hours as needed for wheezing.    . bisoprolol (ZEBETA) 5 MG tablet One twice daily (Patient taking differently: Take 5 mg by mouth 2 (two) times daily. ) 180 tablet 3  . blood glucose meter kit  and supplies KIT Dispense based on patient and insurance preference. Use up to four times daily as directed. Dx Code: E11.65 1 each 0  . Cholecalciferol (VITAMIN D3) 50000 units CAPS Take 50,000 Units by mouth once a week. 8 capsule 0  . citalopram (CELEXA) 40 MG tablet Take 1 tablet (40 mg total) by mouth daily. 90 tablet 0  . clopidogrel (PLAVIX) 75 MG tablet Take 1 tablet (75 mg total) by mouth daily. Restart on 07/11/2016    . cyclobenzaprine (FLEXERIL) 10 MG tablet Take 1 tablet (10 mg total) by mouth 3 (three) times daily as needed for muscle spasms. 90 tablet 0  . docusate sodium (COLACE) 100 MG capsule Take 100 mg by mouth daily as needed for mild constipation.    . fenofibrate 160 MG tablet Take 160 mg by mouth daily.    . fexofenadine (ALLEGRA) 180 MG tablet Take 180 mg by mouth daily.    . finasteride (PROSCAR) 5 MG tablet Take 5 mg by mouth daily.    . fluticasone (FLONASE) 50 MCG/ACT nasal spray Place 2 sprays into both nostrils daily as needed for allergies.     . furosemide (LASIX) 40 MG tablet TAKE 1 TO 2 TABLETS TWICE DAILY AS DIRECTED 360 tablet 2  . gabapentin (NEURONTIN) 600 MG tablet Take 2 tablets (1,200 mg total) by mouth 3 (three) times daily. 540 tablet 1  . glimepiride (AMARYL) 2 MG tablet Take 1 tablet (2 mg total) by mouth daily. 90 tablet 0  . insulin aspart (NOVOLOG FLEXPEN) 100 UNIT/ML FlexPen Inject 20-30 Units into the skin 4 (four) times daily. 45 mL 1  .  insulin detemir (LEVEMIR) 100 UNIT/ML injection Inject 0.6 mLs (60 Units total) into the skin at bedtime. 30 mL 3  . irbesartan (AVAPRO) 300 MG tablet Take 1 tablet (300 mg total) by mouth daily. (Patient taking differently: Take 300 mg by mouth 2 (two) times daily. ) 90 tablet 1  . L-Lysine 1000 MG TABS Take 1,000 mg by mouth daily as needed (for fever blisters).    . liraglutide 18 MG/3ML SOPN Inject 0.3 mLs (1.8 mg total) into the skin daily. 27 mL 3  . LORazepam (ATIVAN) 1 MG tablet Take 1 tablet (1 mg total)  by mouth every 8 (eight) hours as needed for anxiety. 90 tablet 0  . memantine (NAMENDA) 10 MG tablet Take 1 tablet (10 mg total) by mouth 2 (two) times daily. 180 tablet 0  . OXYGEN 2lpm with sleep and occ as needed during the day [employer]    . pantoprazole (PROTONIX) 40 MG tablet Take 1 tablet (40 mg total) by mouth daily. 90 tablet 0  . Potassium Gluconate 595 MG CAPS Take 2 capsules by mouth daily.    . pregabalin (LYRICA) 75 MG capsule Take 1 capsule (75 mg total) by mouth 2 (two) times daily. 60 capsule 0  . promethazine (PHENERGAN) 25 MG tablet Take 1 tablet (25 mg total) by mouth every 6 (six) hours as needed for nausea or vomiting. 15 tablet 0  . Specialty Vitamins Products (MAGNESIUM, AMINO ACID CHELATE,) 133 MG tablet Take 1 tablet by mouth 2 (two) times daily.    . sucralfate (CARAFATE) 1 g tablet Take 1 g by mouth 4 (four) times daily -  with meals and at bedtime.    . traMADol (ULTRAM) 50 MG tablet Take 1 tablet (50 mg total) by mouth every 8 (eight) hours as needed for moderate pain. 60 tablet 0  . umeclidinium-vilanterol (ANORO ELLIPTA) 62.5-25 MCG/INH AEPB Inhale 1 puff into the lungs daily. 3 each 3  . VENTOLIN HFA 108 (90 Base) MCG/ACT inhaler INHALE 2 PUFFS EVERY 6 HOURS AS NEEDED 54 g 2   No facility-administered medications prior to visit.     Review of Systems  Constitutional: Negative for chills and fever.  Musculoskeletal: Negative for neck pain.       Positive for left arm pain.  Neurological: Positive for weakness. Negative for numbness.      Objective:    BP (!) 144/86 (BP Location: Left Arm, Patient Position: Sitting, Cuff Size: Large)   Pulse 94   Temp 98.1 F (36.7 C) (Oral)   Resp 18   Ht _0  (1.753 m)   Wt 289 lb (131.1 kg)   SpO2 98%   BMI 42.68 kg/m  Nursing note and vital signs reviewed.  Physical Exam  Constitutional: He is oriented to person, place, and time. He appears well-developed and well-nourished. No distress.  Cardiovascular:  Normal rate, regular rhythm, normal heart sounds and intact distal pulses.   Pulmonary/Chest: Effort normal and breath sounds normal.  Musculoskeletal:  Left upper arm/shoulder - deformity, discoloration, or edema. Palpable tenderness along subacromial space and proximal deltoid muscle. No crepitus or deformity. Range of motion restricted to 90 of flexion and abduction. Strength is 3-4+. Distal pulses and sensation are intact and appropriate.  Neurological: He is alert and oriented to person, place, and time.  Skin: Skin is warm and dry.  Psychiatric: He has a normal mood and affect. His behavior is normal. Judgment and thought content normal.       Assessment &  Plan:   Problem List Items Addressed This Visit      Endocrine   Diabetes mellitus type II, uncontrolled ([place])    In office hemoglobin A1c of 9.3 which is improved from 10.1. Increase Levemir to 95 units. Continue current dosage of glimepiride and NovoLog. Encouraged follow a low/modified carbohydrate diet. Continue to monitor.      Relevant Orders   POCT HgB A1C (Completed)     Other   Pain of left upper extremity - Primary    Symptoms and exam remain consistent with possible rotator cuff pathology that is refractory to cortisone injection. Continue with conservative treatment of ice and home exercise therapy. Refer to orthopedics for further assessment and treatment. Obtain x-rays to rule out fracture or possible calcific tendinitis. Continue to monitor.      Relevant Orders   AMB referral to orthopedics   DG Shoulder Left (Completed)       I am having Mr. Bray maintain his Potassium Gluconate, fluticasone, promethazine, L-Lysine, magnesium (amino acid chelate), docusate sodium, fexofenadine, finasteride, albuterol, OXYGEN, irbesartan, umeclidinium-vilanterol, bisoprolol, VENTOLIN HFA, sucralfate, fenofibrate, Vitamin D3, clopidogrel, furosemide, liraglutide, citalopram, pantoprazole, glimepiride, insulin detemir,  memantine, pregabalin, cyclobenzaprine, insulin aspart, gabapentin, traMADol, LORazepam, and blood glucose meter kit and supplies.   Follow-up: Return in about 3 months (around 01/04/2017), or if symptoms worsen or fail to improve.  Mauricio Po, FNP

## 2016-10-05 ENCOUNTER — Encounter: Payer: Self-pay | Admitting: Family

## 2016-10-06 ENCOUNTER — Other Ambulatory Visit: Payer: Self-pay | Admitting: Family

## 2016-10-06 DIAGNOSIS — M797 Fibromyalgia: Secondary | ICD-10-CM

## 2016-10-07 ENCOUNTER — Other Ambulatory Visit: Payer: Self-pay | Admitting: Family

## 2016-10-07 NOTE — Telephone Encounter (Signed)
Ok to increase tramadol to 1-2 tablets every 8 hours as needed. Not due for a refill of Lyrica. Ok to send dated prescription if desired.

## 2016-10-07 NOTE — Telephone Encounter (Signed)
Please remove [facility] from Pts chart.  Please resend pregabalin (LYRICA) 75 MG capsule   to [facility] order.  [phone]   Pt was wondering if you can up the dosage of traMADol (ULTRAM) 50 MG tablet

## 2016-10-10 ENCOUNTER — Other Ambulatory Visit: Payer: Self-pay | Admitting: Family

## 2016-10-10 MED ORDER — TRAMADOL HCL 50 MG PO TABS: 50.0000 mg | ORAL_TABLET | Freq: Three times a day (TID) | ORAL | 0 refills | Status: DC | PRN

## 2016-10-10 NOTE — Telephone Encounter (Signed)
Pt aware and rx has been faxed over to [facility] per pts request.

## 2016-10-10 NOTE — Progress Notes (Unsigned)
Subjective:    Patient ID: Bryan Becker, male    DOB: 15-Jul-1960, 56 y.o.   MRN: [id number]  No chief complaint on file.   HPI:  Bryan Becker is a 56 y.o. male who  has a past medical history of Anxiety; Arthritis; Asthma; CHF (congestive heart failure) ([place]); Chronic back pain; Chronic bronchitis ([place]); Cirrhosis ([place]); Confusion; COPD (chronic obstructive pulmonary disease) ([place]); Dementia; Dementia; Depression; Fall at home (06/04/14); Family history of adverse reaction to anesthesia; Fibromyalgia; Frequent urination; Gastric ulcer; GERD (gastroesophageal reflux disease); Headache(784.0); Hyperlipidemia; Hypertension; Kidney stone; Myocardial infarction [facility]); Nausea & vomiting; OSA (obstructive sleep apnea) (04/24/2013); Pancreatitis; Pneumonia; RMSF [facility] spotted fever) (11/22/2012); Seasonal allergies; Shortness of breath dyspnea; Stroke [facility]) (03/2013); Stroke [facility]) (12/04/2013); and Type II diabetes mellitus ([place]). and presents today ***    Allergies  Allergen Reactions  . Actos [Pioglitazone] Shortness Of Breath    Caused patient to be in CHF  . Aricept [Donepezil Hcl] Diarrhea      Outpatient Medications Prior to Visit  Medication Sig Dispense Refill  . albuterol (ACCUNEB) 1.25 MG/3ML nebulizer solution Take 1 ampule by nebulization every 6 (six) hours as needed for wheezing.    . bisoprolol (ZEBETA) 5 MG tablet One twice daily (Patient taking differently: Take 5 mg by mouth 2 (two) times daily. ) 180 tablet 3  . blood glucose meter kit and supplies KIT Dispense based on patient and insurance preference. Use up to four times daily as directed. Dx Code: E11.65 1 each 0  . Cholecalciferol (VITAMIN D3) 50000 units CAPS Take 50,000 Units by mouth once a week. 8 capsule 0  . citalopram (CELEXA) 40 MG tablet TAKE 1 TABLET (40 MG TOTAL) BY MOUTH DAILY. 90 tablet 0  . clopidogrel (PLAVIX) 75 MG tablet Take 1 tablet (75 mg total) by mouth daily. Restart on 07/11/2016    .  cyclobenzaprine (FLEXERIL) 10 MG tablet Take 1 tablet (10 mg total) by mouth 3 (three) times daily as needed for muscle spasms. 90 tablet 0  . docusate sodium (COLACE) 100 MG capsule Take 100 mg by mouth daily as needed for mild constipation.    . fenofibrate 160 MG tablet Take 160 mg by mouth daily.    . fexofenadine (ALLEGRA) 180 MG tablet Take 180 mg by mouth daily.    . finasteride (PROSCAR) 5 MG tablet Take 5 mg by mouth daily.    . fluticasone (FLONASE) 50 MCG/ACT nasal spray Place 2 sprays into both nostrils daily as needed for allergies.     . furosemide (LASIX) 40 MG tablet TAKE 1 TO 2 TABLETS TWICE DAILY AS DIRECTED 360 tablet 2  . gabapentin (NEURONTIN) 600 MG tablet Take 2 tablets (1,200 mg total) by mouth 3 (three) times daily. 540 tablet 1  . glimepiride (AMARYL) 2 MG tablet TAKE 1 TABLET (2 MG TOTAL) BY MOUTH DAILY. 90 tablet 0  . insulin detemir (LEVEMIR) 100 UNIT/ML injection Inject 0.6 mLs (60 Units total) into the skin at bedtime. 30 mL 3  . irbesartan (AVAPRO) 300 MG tablet Take 1 tablet (300 mg total) by mouth daily. (Patient taking differently: Take 300 mg by mouth 2 (two) times daily. ) 90 tablet 1  . L-Lysine 1000 MG TABS Take 1,000 mg by mouth daily as needed (for fever blisters).    . liraglutide 18 MG/3ML SOPN Inject 0.3 mLs (1.8 mg total) into the skin daily. 27 mL 3  . LORazepam (ATIVAN) 1 MG tablet Take 1 tablet (1  mg total) by mouth every 8 (eight) hours as needed for anxiety. 90 tablet 0  . memantine (NAMENDA) 10 MG tablet TAKE 1 TABLET (10 MG TOTAL) BY MOUTH 2 (TWO) TIMES DAILY. 180 tablet 0  . NOVOLOG FLEXPEN 100 UNIT/ML FlexPen INJECT 20 TO 30 UNITS INTO THE SKIN 4 (FOUR) TIMES DAILY. 90 mL 1  . OXYGEN 2lpm with sleep and occ as needed during the day [employer]    . pantoprazole (PROTONIX) 40 MG tablet Take 1 tablet (40 mg total) by mouth daily. 90 tablet 0  . Potassium Gluconate 595 MG CAPS Take 2 capsules by mouth daily.    . pregabalin (LYRICA) 75 MG capsule  Take 1 capsule (75 mg total) by mouth 2 (two) times daily. 60 capsule 0  . promethazine (PHENERGAN) 25 MG tablet Take 1 tablet (25 mg total) by mouth every 6 (six) hours as needed for nausea or vomiting. 15 tablet 0  . Specialty Vitamins Products (MAGNESIUM, AMINO ACID CHELATE,) 133 MG tablet Take 1 tablet by mouth 2 (two) times daily.    . sucralfate (CARAFATE) 1 g tablet Take 1 g by mouth 4 (four) times daily -  with meals and at bedtime.    Marland Kitchen umeclidinium-vilanterol (ANORO ELLIPTA) 62.5-25 MCG/INH AEPB Inhale 1 puff into the lungs daily. 3 each 3  . VENTOLIN HFA 108 (90 Base) MCG/ACT inhaler INHALE 2 PUFFS EVERY 6 HOURS AS NEEDED 54 g 2  . traMADol (ULTRAM) 50 MG tablet Take 1 tablet (50 mg total) by mouth every 8 (eight) hours as needed for moderate pain. 60 tablet 0   No facility-administered medications prior to visit.      Past Medical History:  Diagnosis Date  . Anxiety   . Arthritis    "joints"  . Asthma   . CHF (congestive heart failure) ([place])   . Chronic back pain    "mostly lower; have 2 bulging discs in my neck" (12/05/2013)  . Chronic bronchitis ([place])    "get it q yr"  . 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 Confusion   . COPD (chronic obstructive pulmonary disease) ([place])   . Dementia    onset of early  . Dementia   . Depression   . Fall at home 06/04/14   Pt stated he falls off the bed at nightly  . Family history of adverse reaction to anesthesia    Patients mother had a bad reaction to Anesthesia however pt is unaware of reaction  . Fibromyalgia   . Frequent urination   . Gastric ulcer   . GERD (gastroesophageal reflux disease)   . YCXKGYJE(563.1)    "weekly" (12/04/2013)  . Hyperlipidemia   . Hypertension   . Kidney stone   . Myocardial infarction [facility])    unsure about MI history, but had normal cath that admission '07 ([facility])  . Nausea & vomiting   . OSA (obstructive sleep apnea) 04/24/2013   "use BiPAP"  . Pancreatitis   .  Pneumonia    "several times"  . RMSF [facility] spotted fever) 11/22/2012  . Seasonal allergies   . Shortness of breath dyspnea    with ambulation  . Stroke [facility]) 03/2013   "still weak on the left side" (12/05/2013)  . Stroke [facility]) 12/04/2013   "came in today w/right sided weakness" (12/04/2013)  . Type II diabetes mellitus ([place])       Past Surgical History:  Procedure Laterality Date  . CARDIAC CATHETERIZATION  10/28/05: right dominant, normal LV systolic function, normal coronaries. (Dr. Baxter Hire, [facility])  . CHOLECYSTECTOMY    . COLONOSCOPY    . COLONOSCOPY WITH PROPOFOL N/A 07/08/2016   Procedure: COLONOSCOPY WITH PROPOFOL;  Surgeon: Gatha Mayer, MD;  Location: [facility] ENDOSCOPY;  Service: Endoscopy;  Laterality: N/A;  . CYSTOSCOPY W/ STONE MANIPULATION    . LUMBAR LAMINECTOMY/DECOMPRESSION MICRODISCECTOMY Left 06/18/2014   Procedure: LUMBAR LAMINECTOMY/DECOMPRESSION MICRODISCECTOMY LEFT LUMBAR FIVE-SACRAL ONE;  Surgeon: Ashok Pall, MD;  Location: [place] [facility]S;  Service: Neurosurgery;  Laterality: Left;  left  . multiple cysts removal-hip,wrist    . TONSILLECTOMY  ~ 1985  . UPPER GASTROINTESTINAL ENDOSCOPY        Family History  Problem Relation Age of Onset  . Skin cancer Father   . Heart attack Father   . Stomach cancer Father   . Hypertension Father   . Aneurysm Father   . Lung cancer Paternal Grandfather   . Heart disease Paternal Grandfather   . Stomach cancer Maternal Grandfather   . Bone cancer Paternal Grandmother   . Heart attack Paternal Grandmother   . Diabetes Mother   . Hypertension Mother   . Arthritis Mother   . Other Mother        brain stem vertigo/disorder of pancreas  . Diabetes Maternal Grandmother   . Heart disease Maternal Grandmother   . Aneurysm Maternal Uncle   . Aneurysm Maternal Uncle   . Colon cancer Neg Hx   . Esophageal cancer Neg Hx   . Rectal cancer Neg Hx       Social History   Social History  . Marital status: Married      Spouse name: N/A  . Number of children: 2  . Years of education: 16   Occupational History  . [employer]   Social History Main Topics  . Smoking status: Former Smoker    Packs/day: 2.00    Years: 21.00    Types: Cigarettes    Quit date: 05/23/2002  . Smokeless tobacco: Never Used  . Alcohol use No  . Drug use: No  . Sexual activity: Not Currently   Other Topics Concern  . Not on file   Social History Narrative  . No narrative on file      Review of Systems     Objective:    There were no vitals taken for this visit. Nursing note and vital signs reviewed.  Physical Exam      Assessment & Plan:   Problem List Items Addressed This Visit    None       I have changed Mr. Scharrer's traMADol. I am also having him maintain his Potassium Gluconate, fluticasone, promethazine, L-Lysine, magnesium (amino acid chelate), docusate sodium, fexofenadine, finasteride, albuterol, OXYGEN, irbesartan, umeclidinium-vilanterol, bisoprolol, VENTOLIN HFA, sucralfate, fenofibrate, Vitamin D3, clopidogrel, furosemide, liraglutide, pantoprazole, insulin detemir, pregabalin, cyclobenzaprine, gabapentin, LORazepam, blood glucose meter kit and supplies, citalopram, memantine, glimepiride, and NOVOLOG FLEXPEN.   Meds ordered this encounter  Medications  . traMADol (ULTRAM) 50 MG tablet    Sig: Take 1-2 tablets (50-100 mg total) by mouth every 8 (eight) hours as needed for moderate pain.    Dispense:  180 tablet    Refill:  0    Order Specific Question:   Supervising Provider    Answer:   Pricilla Holm A [[id number]]     Follow-up: No Follow-up on file.  Bryan Po, FNP

## 2016-10-11 ENCOUNTER — Other Ambulatory Visit: Payer: Self-pay | Admitting: Family

## 2016-10-11 DIAGNOSIS — M797 Fibromyalgia: Secondary | ICD-10-CM

## 2016-10-12 ENCOUNTER — Other Ambulatory Visit: Payer: Self-pay | Admitting: Family

## 2016-10-12 DIAGNOSIS — M797 Fibromyalgia: Secondary | ICD-10-CM

## 2016-10-12 MED ORDER — VITAMIN D3 1.25 MG (50000 UT) PO CAPS: 50000.0000 [IU] | ORAL_CAPSULE | ORAL | 0 refills | Status: DC

## 2016-10-12 MED ORDER — PREGABALIN 75 MG PO CAPS: 75.0000 mg | ORAL_CAPSULE | Freq: Two times a day (BID) | ORAL | 0 refills | Status: DC

## 2016-10-12 NOTE — Telephone Encounter (Signed)
Pt left msg on triage stating he is not sure what happen, but [employer] has been trying to get refill on his Lyrica and they stated they haven't received response back from office. Pt is completely out of his Lyrica he is requesting 90 day script to be sent to [facility], and a 10 day supply to be sent to his local drug store...Bryan Becker

## 2016-10-12 NOTE — Telephone Encounter (Signed)
Per previous refill he should not be out of the medication for the next 2 days as he was given a 30 day supply on 09/14/16 which would be 10/14/16. We will send in prescriptions dated for 5/25.

## 2016-10-13 ENCOUNTER — Encounter: Payer: Self-pay | Admitting: Internal Medicine

## 2016-10-13 ENCOUNTER — Ambulatory Visit (INDEPENDENT_AMBULATORY_CARE_PROVIDER_SITE_OTHER): Payer: Medicare HMO | Admitting: Internal Medicine

## 2016-10-13 VITALS — BP 122/78 | HR 78 | Ht 69.0 in | Wt 291.2 lb

## 2016-10-13 DIAGNOSIS — G4733 Obstructive sleep apnea (adult) (pediatric): Secondary | ICD-10-CM

## 2016-10-13 DIAGNOSIS — J9611 Chronic respiratory failure with hypoxia: Secondary | ICD-10-CM | POA: Diagnosis not present

## 2016-10-13 DIAGNOSIS — J449 Chronic obstructive pulmonary disease, unspecified: Secondary | ICD-10-CM

## 2016-10-13 NOTE — Telephone Encounter (Signed)
Notified pt w/Greg response.../lmb 

## 2016-10-13 NOTE — Patient Instructions (Addendum)
Order- [facility]  BIPAP 18/15    Mask of choice, humidifier, supplies, AirView    Dx OSA  Please call as needed

## 2016-10-13 NOTE — Assessment & Plan Note (Signed)
Severe obstructive sleep apnea/hypopnea syndrome. Previously did best with BiPAP and not comfortable at CPAP 18. Plan-he needs to formally establish with DME with replacement machine. We will start BiPAP 18/15 and adjust as needed. He may need reevaluation for supplemental oxygen during sleep.

## 2016-10-13 NOTE — Assessment & Plan Note (Signed)
He didn't seem to need supplemental oxygen on his most recent CPAP titration study in April. We will clarify this status over time.

## 2016-10-13 NOTE — Assessment & Plan Note (Signed)
He is pleased with how much better controlled his breathing has been under current management. This is likely to help [place] with his sleep apnea control as well.

## 2016-10-13 NOTE — Progress Notes (Signed)
10/13/16-56-year-old male former smoker being seen to establish for care of OSA complicated by COPD Gold III, , chronic respiratory failure with hypoxia, HBP, DM 2, history CVA NPSG 10/26/14-AHI 40/hour, desaturation to 79%, CPAP titrated to 15, body weight 270 pounds O2 2 L sleep/Advanced SLEEP CONSULT FOR  patient has a BIPAP machine that has been broken about 3months he wass using a friend CPAP machine but does not know the setting and states that it is not working very well for him. In the past CPAP was 18/Advanced with O2 2 L sleep and as needed during the day He had done best with BiPAP based on sleep study down at [place]/ Dr Chodri, where oxygen was added at 2 L.  Without adequate control he is snoring loudly with witnessed apneas and daytime sleepiness. Aware of some movement in sleep, not complex, and seems to be related to respiratory arousals. ENT surgery-tonsils. He says his breathing is very much better now under Dr.Wert's management. Not much cough or wheeze. He takes no sleep medicines. He is followed by cardiology without recent exacerbation. Weight fluctuates with fluid retention.  Prior to Admission medications   Medication Sig Start Date End Date Taking? Authorizing Provider  albuterol (ACCUNEB) 1.25 MG/3ML nebulizer solution Take 1 ampule by nebulization every 6 (six) hours as needed for wheezing.   Yes [provider]  bisoprolol (ZEBETA) 5 MG tablet One twice daily Patient taking differently: Take 5 mg by mouth 2 (two) times daily.  05/26/16  Yes Wert, Michael B, MD  blood glucose meter kit and supplies KIT Dispense based on patient and insurance preference. Use up to four times daily as directed. Dx Code: E11.65 09/19/16  Yes Calone, Gregory D, FNP  Cholecalciferol (VITAMIN D3) 50000 units CAPS Take 50,000 Units by mouth once a week. 10/12/16  Yes Calone, Gregory D, FNP  citalopram (CELEXA) 40 MG tablet TAKE 1 TABLET (40 MG TOTAL) BY MOUTH DAILY. 10/07/16  Yes Calone, Gregory  D, FNP  clopidogrel (PLAVIX) 75 MG tablet Take 1 tablet (75 mg total) by mouth daily. Restart on 07/11/2016 07/08/16  Yes Gessner, Carl E, MD  cyclobenzaprine (FLEXERIL) 10 MG tablet Take 1 tablet (10 mg total) by mouth 3 (three) times daily as needed for muscle spasms. 09/14/16  Yes Calone, Gregory D, FNP  docusate sodium (COLACE) 100 MG capsule Take 100 mg by mouth daily as needed for mild constipation.   Yes [provider]  fenofibrate 160 MG tablet Take 160 mg by mouth daily. 06/22/16  Yes [provider]  fexofenadine (ALLEGRA) 180 MG tablet Take 180 mg by mouth daily.   Yes [provider]  finasteride (PROSCAR) 5 MG tablet Take 5 mg by mouth daily.   Yes [provider]  fluticasone (FLONASE) 50 MCG/ACT nasal spray Place 2 sprays into both nostrils daily as needed for allergies.    Yes [provider]  furosemide (LASIX) 40 MG tablet TAKE 1 TO 2 TABLETS TWICE DAILY AS DIRECTED 07/27/16  Yes Croitoru, Mihai, MD  gabapentin (NEURONTIN) 600 MG tablet Take 2 tablets (1,200 mg total) by mouth 3 (three) times daily. 09/14/16  Yes Calone, Gregory D, FNP  glimepiride (AMARYL) 2 MG tablet TAKE 1 TABLET (2 MG TOTAL) BY MOUTH DAILY. 10/07/16  Yes Calone, Gregory D, FNP  insulin detemir (LEVEMIR) 100 UNIT/ML injection Inject 0.6 mLs (60 Units total) into the skin at bedtime. 09/09/16  Yes Calone, Gregory D, FNP  irbesartan (AVAPRO) 300 MG tablet Take 1 tablet (300   mg total) by mouth daily. Patient taking differently: Take 300 mg by mouth 2 (two) times daily.  04/22/16  Yes Wert, Michael B, MD  L-Lysine 1000 MG TABS Take 1,000 mg by mouth daily as needed (for fever blisters).   Yes [provider]  liraglutide 18 MG/3ML SOPN Inject 0.3 mLs (1.8 mg total) into the skin daily. 08/15/16  Yes Calone, Gregory D, FNP  LORazepam (ATIVAN) 1 MG tablet TAKE 1 TABLET BY MOUTH EVERY EIGHT HOURSAS NEEDED FOR ANXIETY 10/12/16  Yes Calone, Gregory D, FNP  memantine (NAMENDA) 10  MG tablet TAKE 1 TABLET (10 MG TOTAL) BY MOUTH 2 (TWO) TIMES DAILY. 10/07/16  Yes Calone, Gregory D, FNP  NOVOLOG FLEXPEN 100 UNIT/ML FlexPen INJECT 20 TO 30 UNITS INTO THE SKIN 4 (FOUR) TIMES DAILY. 10/07/16  Yes Calone, Gregory D, FNP  OXYGEN 2lpm with sleep and occ as needed during the day [employer]   Yes [provider]  pantoprazole (PROTONIX) 40 MG tablet Take 1 tablet (40 mg total) by mouth daily. 08/15/16  Yes Calone, Gregory D, FNP  Potassium Gluconate 595 MG CAPS Take 2 capsules by mouth daily.   Yes [provider]  pregabalin (LYRICA) 75 MG capsule Take 1 capsule (75 mg total) by mouth 2 (two) times daily. 10/12/16  Yes Calone, Gregory D, FNP  promethazine (PHENERGAN) 25 MG tablet Take 1 tablet (25 mg total) by mouth every 6 (six) hours as needed for nausea or vomiting. 02/11/14  Yes Ward, Kristen N, DO  Specialty Vitamins Products (MAGNESIUM, AMINO ACID CHELATE,) 133 MG tablet Take 1 tablet by mouth 2 (two) times daily.   Yes [provider]  sucralfate (CARAFATE) 1 g tablet Take 1 g by mouth 4 (four) times daily -  with meals and at bedtime.   Yes [provider]  traMADol (ULTRAM) 50 MG tablet Take 1-2 tablets (50-100 mg total) by mouth every 8 (eight) hours as needed for moderate pain. 10/10/16  Yes Calone, Gregory D, FNP  umeclidinium-vilanterol (ANORO ELLIPTA) 62.5-25 MCG/INH AEPB Inhale 1 puff into the lungs daily. 05/24/16  Yes Wert, Michael B, MD  VENTOLIN HFA 108 (90 Base) MCG/ACT inhaler INHALE 2 PUFFS EVERY 6 HOURS AS NEEDED 06/17/16  Yes Parrett, Tammy S, NP   Past Medical History:  Diagnosis Date  . Anxiety   . Arthritis    "joints"  . Asthma   . CHF (congestive heart failure) (HCC)   . Chronic back pain    "mostly lower; have 2 bulging discs in my neck" (12/05/2013)  . Chronic bronchitis (HCC)    "get it q yr"  . 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 Confusion   . COPD (chronic obstructive pulmonary disease) (HCC)    . Dementia    onset of early  . Dementia   . Depression   . Fall at home 06/04/14   Pt stated he falls off the bed at nightly  . Family history of adverse reaction to anesthesia    Patients mother had a bad reaction to Anesthesia however pt is unaware of reaction  . Fibromyalgia   . Frequent urination   . Gastric ulcer   . GERD (gastroesophageal reflux disease)   . Headache(784.0)    "weekly" (12/04/2013)  . Hyperlipidemia   . Hypertension   . Kidney stone   . Myocardial infarction (HCC)    unsure about MI history, but had normal cath that admission '07 ([facility])  . Nausea &   vomiting   . OSA (obstructive sleep apnea) 04/24/2013   "use BiPAP"  . Pancreatitis   . Pneumonia    "several times"  . RMSF ([place] spotted fever) 11/22/2012  . Seasonal allergies   . Shortness of breath dyspnea    with ambulation  . Stroke (HCC) 03/2013   "still weak on the left side" (12/05/2013)  . Stroke (HCC) 12/04/2013   "came in today w/right sided weakness" (12/04/2013)  . Type II diabetes mellitus (HCC)    Past Surgical History:  Procedure Laterality Date  . CARDIAC CATHETERIZATION     10/28/05: right dominant, normal LV systolic function, normal coronaries. (Dr. Zan Tyson, [facility])  . CHOLECYSTECTOMY    . COLONOSCOPY    . COLONOSCOPY WITH PROPOFOL N/A 07/08/2016   Procedure: COLONOSCOPY WITH PROPOFOL;  Surgeon: Carl E Gessner, MD;  Location: [facility];  Service: Endoscopy;  Laterality: N/A;  . CYSTOSCOPY W/ STONE MANIPULATION    . LUMBAR LAMINECTOMY/DECOMPRESSION MICRODISCECTOMY Left 06/18/2014   Procedure: LUMBAR LAMINECTOMY/DECOMPRESSION MICRODISCECTOMY LEFT LUMBAR FIVE-SACRAL ONE;  Surgeon: Kyle Cabbell, MD;  Location: [facility];  Service: Neurosurgery;  Laterality: Left;  left  . multiple cysts removal-hip,wrist    . TONSILLECTOMY  ~ 1985  . UPPER GASTROINTESTINAL ENDOSCOPY     Family History  Problem Relation Age of Onset  . Skin cancer Father   . Heart attack Father   . Stomach  cancer Father   . Hypertension Father   . Aneurysm Father   . Lung cancer Paternal Grandfather   . Heart disease Paternal Grandfather   . Stomach cancer Maternal Grandfather   . Bone cancer Paternal Grandmother   . Heart attack Paternal Grandmother   . Diabetes Mother   . Hypertension Mother   . Arthritis Mother   . Other Mother        brain stem vertigo/disorder of pancreas  . Diabetes Maternal Grandmother   . Heart disease Maternal Grandmother   . Aneurysm Maternal Uncle   . Aneurysm Maternal Uncle   . Colon cancer Neg Hx   . Esophageal cancer Neg Hx   . Rectal cancer Neg Hx    Social History   Social History  . Marital status: Married    Spouse name: N/A  . Number of children: 2  . Years of education: 16   Occupational History  . Disability [employer]   Social History Main Topics  . Smoking status: Former Smoker    Packs/day: 2.00    Years: 21.00    Types: Cigarettes    Quit date: 05/23/2002  . Smokeless tobacco: Never Used  . Alcohol use No  . Drug use: No  . Sexual activity: Not Currently   Other Topics Concern  . Not on file   Social History Narrative  . No narrative on file   ROS-see HPI    "+" = pos Constitutional:    weight loss, night sweats, fevers, chills, + fatigue, lassitude. HEENT:    headaches, difficulty swallowing, tooth/dental problems, sore throat,       sneezing, itching, ear ache, nasal congestion, post nasal drip, snoring CV:    + chest pain, orthopnea, PND, swelling in lower extremities, anasarca,                                                        dizziness, palpitations Resp:   + shortness of breath with exertion or at rest.                productive cough,   non-productive cough, coughing up of blood.              change in color of mucus.  wheezing.   Skin:    rash or lesions. GI:  No-   heartburn, indigestion, abdominal pain, nausea, vomiting, diarrhea,                 change in bowel habits, loss of appetite GU:  dysuria, change in color of urine, no urgency or frequency.   flank pain. MS:   + joint pain, stiffness, decreased range of motion, back pain. Neuro-     nothing unusual Psych:  change in mood or affect.  depression or + anxiety.   memory loss.  OBJ- Physical Exam General- Alert, Oriented, Affect-appropriate, Distress- none acute, + obese Skin- rash-none, lesions- none, excoriation- none Lymphadenopathy- none Head- atraumatic            Eyes- Gross vision intact, PERRLA, conjunctivae and secretions clear            Ears- Hearing, canals-normal            Nose- Clear, no-Septal dev, mucus, polyps, erosion, perforation             Throat- Mallampati IV , mucosa clear , drainage- none, tonsils- atrophic Neck- flexible , trachea midline, no stridor , thyroid nl, carotid no bruit Chest - symmetrical excursion , unlabored           Heart/CV- RRR , no murmur , no gallop  , no rub, nl s1 s2                           - JVD- none , edema- none, stasis changes- none, varices- none           Lung- clear to P&A, wheeze- none, cough- none , dullness-none, rub- none           Chest wall-  Abd-  Br/ Gen/ Rectal- Not done, not indicated Extrem- cyanosis- none, clubbing, none, atrophy- none, strength- nl, + cane Neuro- grossly intact to observation

## 2016-10-18 ENCOUNTER — Telehealth: Payer: Self-pay | Admitting: Internal Medicine

## 2016-10-18 ENCOUNTER — Telehealth: Payer: Self-pay | Admitting: Family

## 2016-10-18 MED ORDER — LORAZEPAM 1 MG PO TABS: ORAL_TABLET | ORAL | 1 refills | Status: DC

## 2016-10-18 NOTE — Telephone Encounter (Signed)
Medication has been refaxed to [facility]

## 2016-10-18 NOTE — Telephone Encounter (Signed)
LM x 1 for Melissa/[facility] Sleep study is in [facility] 09/07/2016

## 2016-10-18 NOTE — Telephone Encounter (Signed)
Please resend LORazepam (ATIVAN) 1 MG tablet   to [facility] [place] Pharmacy did not receive it.

## 2016-10-19 ENCOUNTER — Other Ambulatory Visit: Payer: Self-pay | Admitting: Family

## 2016-10-19 ENCOUNTER — Telehealth: Payer: Self-pay | Admitting: Family

## 2016-10-19 MED ORDER — LORAZEPAM 1 MG PO TABS: ORAL_TABLET | ORAL | 0 refills | Status: DC

## 2016-10-19 NOTE — Telephone Encounter (Signed)
Melissa returned call She stated that [facility] needs the BiPAP titration that CY mentioned in the last ov note dome by Dr Alcide Clever because the sleep study VS read recommended CPAP but BiPAP was ordered at the sleep consult with CY.  Sammuel Hines will try to request this from Dr Maxie Barb office Request filled out on fax cover sheet and faxed to [phone] Request placed in Katie's records request folder at her desk

## 2016-10-19 NOTE — Telephone Encounter (Signed)
Patient has called back stating [facility] did not get script for lorazepam.  Patient states he is currently out.  Please follow up in regard.

## 2016-10-19 NOTE — Telephone Encounter (Signed)
LMTCB

## 2016-10-19 NOTE — Telephone Encounter (Signed)
Have sent numerous scripts to [facility]. Called [facility] and the the lady stated they received all the scripts and she does not know what happened but they were getting medications ready to be sent out now. Sent a short supply of lorazapam to [facility]. Pt is aware.

## 2016-10-21 ENCOUNTER — Ambulatory Visit (INDEPENDENT_AMBULATORY_CARE_PROVIDER_SITE_OTHER): Payer: Self-pay | Admitting: Orthopaedic Surgery

## 2016-10-22 ENCOUNTER — Other Ambulatory Visit: Payer: Self-pay | Admitting: Family

## 2016-10-25 ENCOUNTER — Encounter (INDEPENDENT_AMBULATORY_CARE_PROVIDER_SITE_OTHER): Payer: Self-pay | Admitting: Orthopaedic Surgery

## 2016-10-25 ENCOUNTER — Ambulatory Visit (INDEPENDENT_AMBULATORY_CARE_PROVIDER_SITE_OTHER): Payer: Medicare HMO | Admitting: Orthopaedic Surgery

## 2016-10-25 DIAGNOSIS — G8929 Other chronic pain: Secondary | ICD-10-CM | POA: Diagnosis not present

## 2016-10-25 DIAGNOSIS — M25512 Pain in left shoulder: Secondary | ICD-10-CM | POA: Diagnosis not present

## 2016-10-25 NOTE — Progress Notes (Signed)
Office Visit Note   Patient: Bryan Becker           Date of Birth: 1961-01-01           MRN: [id number] Visit Date: 10/25/2016              Requested by: Golden Circle, [place], [place] [address] PCP: Golden Circle, FNP   Assessment & Plan: Visit Diagnoses:  1. Chronic left shoulder pain     Plan: Recommend MRI of the left shoulder to rule out occult fracture versus rotator cuff tear. Patient's failed conservative treatment. Follow-up after the MRI.  Follow-Up Instructions: Return in about 2 weeks (around 11/08/2016).   Orders:  Orders Placed This Encounter  Procedures  . MR Shoulder Left w/o contrast   No orders of the defined types were placed in this encounter.     Procedures: No procedures performed   Clinical Data: No additional findings.   Subjective: Chief Complaint  Patient presents with  . Left Shoulder - Pain    Patient is a 56 year old, who has fallen 2-3 times a week over the last 2-3 months onto his left shoulder. He is history of a stroke with left hemiparesis. He has numbness and pain and has difficulty using his arm for gripping. He had an intramuscular steroid injection and his PCP which did not help. He denies any radiation of pain.    Review of Systems  Constitutional: Negative.   All other systems reviewed and are negative.    Objective: Vital Signs: There were no vitals taken for this visit.  Physical Exam  Constitutional: He is oriented to person, place, and time. He appears well-developed and well-nourished.  HENT:  Head: Normocephalic and atraumatic.  Eyes: Pupils are equal, round, and reactive to light.  Neck: Neck supple.  Pulmonary/Chest: Effort normal.  Abdominal: Soft.  Musculoskeletal: Normal range of motion.  Neurological: He is alert and oriented to person, place, and time.  Skin: Skin is warm.  Psychiatric: He has a normal mood and affect. His behavior is normal. Judgment and thought content  normal.  Nursing note and vitals reviewed.   Ortho Exam Left shoulder exam shows painful range of motion in all planes. He does have slight weakness with rotator cuff testing but this difficult to assess whether this truly weak or just because he has had a stroke. He has no focal findings. No sensory deficits. Specialty Comments:  No specialty comments available.  Imaging: No results found.   PMFS History: Patient Active Problem List   Diagnosis Date Noted  . Memory difficulties 09/19/2016  . Polypharmacy 09/19/2016  . Pain of left upper extremity 08/15/2016  . Angiodysplasia of colon   . B12 deficiency 07/06/2016  . Depression, recurrent ([place]) 07/06/2016  . Chronic respiratory failure with hypoxia (HCC)/ noct hypoxemia  05/28/2016  . Bloating 05/24/2016  . Dysphagia 05/24/2016  . Heme + stool 05/24/2016  . Rectal bleeding 05/24/2016  . History of ischemic stroke without residual deficits 04/21/2016  . Family history of abdominal aortic aneurysm (AAA) 04/21/2016  . Dyspnea, unspecified 03/02/2016  . HNP (herniated nucleus pulposus), lumbar 06/18/2014  . Generalized abdominal pain 02/18/2014  . Nausea with vomiting 01/12/2014  . Diabetes mellitus type II, uncontrolled ([place]) 01/12/2014  . Ulcer of intestine 01/12/2014  . Weakness 12/04/2013  . Morbid (severe) obesity due to excess calories ([place]) 04/24/2013  . COPD GOLD  III 04/24/2013  . Fibromyalgia 04/24/2013  . Mixed hyperlipidemia  04/24/2013  . Headache(784.0) 04/24/2013  . OSA (obstructive sleep apnea) 04/24/2013  . Encounter for long-term (current) use of high-risk medication 04/24/2013  . NASH (nonalcoholic steatohepatitis) 11/20/2012  . Essential hypertension 11/20/2012   Past Medical History:  Diagnosis Date  . Anxiety   . Arthritis    "joints"  . Asthma   . CHF (congestive heart failure) ([place])   . Chronic back pain    "mostly lower; have 2 bulging discs in my neck" (12/05/2013)  . Chronic bronchitis ([place])     "get it q yr"  . 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 Confusion   . COPD (chronic obstructive pulmonary disease) ([place])   . Dementia    onset of early  . Dementia   . Depression   . Fall at home 06/04/14   Pt stated he falls off the bed at nightly  . Family history of adverse reaction to anesthesia    Patients mother had a bad reaction to Anesthesia however pt is unaware of reaction  . Fibromyalgia   . Frequent urination   . Gastric ulcer   . GERD (gastroesophageal reflux disease)   . IWLNLGXQ(119.4)    "weekly" (12/04/2013)  . Hyperlipidemia   . Hypertension   . Kidney stone   . Myocardial infarction [facility])    unsure about MI history, but had normal cath that admission '07 ([facility])  . Nausea & vomiting   . OSA (obstructive sleep apnea) 04/24/2013   "use BiPAP"  . Pancreatitis   . Pneumonia    "several times"  . RMSF [facility] spotted fever) 11/22/2012  . Seasonal allergies   . Shortness of breath dyspnea    with ambulation  . Stroke [facility]) 03/2013   "still weak on the left side" (12/05/2013)  . Stroke [facility]) 12/04/2013   "came in today w/right sided weakness" (12/04/2013)  . Type II diabetes mellitus (HCC)     Family History  Problem Relation Age of Onset  . Skin cancer Father   . Heart attack Father   . Stomach cancer Father   . Hypertension Father   . Aneurysm Father   . Lung cancer Paternal Grandfather   . Heart disease Paternal Grandfather   . Stomach cancer Maternal Grandfather   . Bone cancer Paternal Grandmother   . Heart attack Paternal Grandmother   . Diabetes Mother   . Hypertension Mother   . Arthritis Mother   . Other Mother        brain stem vertigo/disorder of pancreas  . Diabetes Maternal Grandmother   . Heart disease Maternal Grandmother   . Aneurysm Maternal Uncle   . Aneurysm Maternal Uncle   . Colon cancer Neg Hx   . Esophageal cancer Neg Hx   . Rectal cancer Neg Hx     Past Surgical History:  Procedure  Laterality Date  . CARDIAC CATHETERIZATION     10/28/05: right dominant, normal LV systolic function, normal coronaries. (Dr. Baxter Hire, [facility])  . CHOLECYSTECTOMY    . COLONOSCOPY    . COLONOSCOPY WITH PROPOFOL N/A 07/08/2016   Procedure: COLONOSCOPY WITH PROPOFOL;  Surgeon: Gatha Mayer, MD;  Location: [facility];  Service: Endoscopy;  Laterality: N/A;  . CYSTOSCOPY W/ STONE MANIPULATION    . LUMBAR LAMINECTOMY/DECOMPRESSION MICRODISCECTOMY Left 06/18/2014   Procedure: LUMBAR LAMINECTOMY/DECOMPRESSION MICRODISCECTOMY LEFT LUMBAR FIVE-SACRAL ONE;  Surgeon: Ashok Pall, MD;  Location: [place] [facility];  Service: Neurosurgery;  Laterality: Left;  left  . multiple  cysts removal-hip,wrist    . TONSILLECTOMY  ~ 1985  . UPPER GASTROINTESTINAL ENDOSCOPY     Social History   Occupational History  . [employer]   Social History Main Topics  . Smoking status: Former Smoker    Packs/day: 2.00    Years: 21.00    Types: Cigarettes    Quit date: 05/23/2002  . Smokeless tobacco: Never Used  . Alcohol use No  . Drug use: No  . Sexual activity: Not Currently

## 2016-10-26 ENCOUNTER — Other Ambulatory Visit: Payer: Self-pay | Admitting: Family

## 2016-10-26 DIAGNOSIS — M79602 Pain in left arm: Secondary | ICD-10-CM

## 2016-10-26 DIAGNOSIS — M797 Fibromyalgia: Secondary | ICD-10-CM

## 2016-11-03 ENCOUNTER — Telehealth: Payer: Self-pay | Admitting: *Deleted

## 2016-11-03 DIAGNOSIS — M797 Fibromyalgia: Secondary | ICD-10-CM

## 2016-11-03 DIAGNOSIS — M79602 Pain in left arm: Secondary | ICD-10-CM

## 2016-11-03 MED ORDER — CYCLOBENZAPRINE HCL 10 MG PO TABS: 10.0000 mg | ORAL_TABLET | Freq: Three times a day (TID) | ORAL | 0 refills | Status: DC | PRN

## 2016-11-03 MED ORDER — CYCLOBENZAPRINE HCL 10 MG PO TABS: ORAL_TABLET | ORAL | 0 refills | Status: DC

## 2016-11-03 MED ORDER — CLOPIDOGREL BISULFATE 75 MG PO TABS: 75.0000 mg | ORAL_TABLET | Freq: Every day | ORAL | 0 refills | Status: DC

## 2016-11-03 NOTE — Telephone Encounter (Signed)
Pt left msg on triage stating he is needing a 10 day supply of flexeril and plavix sent to [facility], but also need 90 day sen to [facility]. Verifed chart inform pt will send to both pharmacy...Johny Chess

## 2016-11-08 ENCOUNTER — Ambulatory Visit (INDEPENDENT_AMBULATORY_CARE_PROVIDER_SITE_OTHER): Payer: Medicare HMO | Admitting: Orthopaedic Surgery

## 2016-11-10 ENCOUNTER — Telehealth: Payer: Self-pay | Admitting: Internal Medicine

## 2016-11-10 ENCOUNTER — Ambulatory Visit (INDEPENDENT_AMBULATORY_CARE_PROVIDER_SITE_OTHER): Payer: Medicare HMO | Admitting: Orthopaedic Surgery

## 2016-11-10 NOTE — Telephone Encounter (Signed)
Left message for patient to call back in the morning.

## 2016-11-11 NOTE — Telephone Encounter (Signed)
lmtcb x2 for pt. 

## 2016-11-11 NOTE — Telephone Encounter (Signed)
Patient returned call Pt getting frustrated that he still has not received his BiPAP.  Discussed with patient again (see 10/18/16 phone note) that [facility] is waiting for a copy of the BiPAP titration that CY mentioned in the sleep consult note on 5.24.18 from Dr Maxie Barb office.  Advised patient that we did fax a records request from Dr Chodri's office - nothing scanned into chart as of yet.  Patient was provided with Dr Maxie Barb office number to try to get this information himself (please note that pt seemed to have no clue who Dr Alcide Clever is, where that office is located, or when this study was done so unsure if patient will be of much help).  ATC to Dr Nancy Nordmann Chodri's office (pulmonologist in [place]) to inquire about the records request we sent but they are closed on Fridays.  Will leave in triage so we can continue to follow

## 2016-11-15 ENCOUNTER — Other Ambulatory Visit: Payer: Self-pay | Admitting: Family

## 2016-11-15 NOTE — Telephone Encounter (Signed)
Last refill was 10/11/16 per [facility]

## 2016-11-15 NOTE — Telephone Encounter (Signed)
Called and spoke with Dr. Maxie Barb office and they stated that they only got the records request and not a release and they are not able to fax [place] anything without the release form.  She stated that the pt is to pick up the sleep study today.  Will wait for the pt to drop this off to us/

## 2016-11-16 ENCOUNTER — Telehealth (INDEPENDENT_AMBULATORY_CARE_PROVIDER_SITE_OTHER): Payer: Self-pay | Admitting: Orthopaedic Surgery

## 2016-11-16 NOTE — Telephone Encounter (Signed)
Kelly returned phone call; pt has not picked up his sleep study.Marland Kitchenert

## 2016-11-16 NOTE — Telephone Encounter (Signed)
Patient called wanting to cancel his MRI tomorrow. CB # [phone]

## 2016-11-16 NOTE — Telephone Encounter (Signed)
Faxed script to [facility]...Johny Chess

## 2016-11-16 NOTE — Telephone Encounter (Signed)
Noted pt can cal imaging and cancel appt and reschedule

## 2016-11-16 NOTE — Telephone Encounter (Signed)
Called and lmomtcb x 1 for Bryan Becker to find out if the pt picked up his sleep study.

## 2016-11-16 NOTE — Telephone Encounter (Signed)
atc pt X3 to remind pt to pick up sleep study as we cannot have it faxed to [place]- line rang to fast busy signal.  Wcb.

## 2016-11-17 ENCOUNTER — Other Ambulatory Visit: Payer: Medicare HMO

## 2016-11-17 NOTE — Telephone Encounter (Signed)
We have attempted to contact the pt several times with no success. Per triage protocol, message will be closed.

## 2016-11-22 ENCOUNTER — Encounter: Payer: Self-pay | Admitting: Family

## 2016-11-22 ENCOUNTER — Ambulatory Visit (INDEPENDENT_AMBULATORY_CARE_PROVIDER_SITE_OTHER): Payer: Medicare HMO | Admitting: Family

## 2016-11-22 ENCOUNTER — Other Ambulatory Visit (INDEPENDENT_AMBULATORY_CARE_PROVIDER_SITE_OTHER): Payer: Medicare HMO

## 2016-11-22 ENCOUNTER — Ambulatory Visit (INDEPENDENT_AMBULATORY_CARE_PROVIDER_SITE_OTHER): Payer: Medicare HMO | Admitting: Orthopaedic Surgery

## 2016-11-22 VITALS — BP 150/74 | HR 88 | Temp 98.6°F | Resp 20 | Ht 69.0 in | Wt 289.0 lb

## 2016-11-22 DIAGNOSIS — Z794 Long term (current) use of insulin: Secondary | ICD-10-CM

## 2016-11-22 DIAGNOSIS — I5032 Chronic diastolic (congestive) heart failure: Secondary | ICD-10-CM

## 2016-11-22 DIAGNOSIS — E538 Deficiency of other specified B group vitamins: Secondary | ICD-10-CM

## 2016-11-22 DIAGNOSIS — E1142 Type 2 diabetes mellitus with diabetic polyneuropathy: Secondary | ICD-10-CM

## 2016-11-22 DIAGNOSIS — I5030 Unspecified diastolic (congestive) heart failure: Secondary | ICD-10-CM | POA: Insufficient documentation

## 2016-11-22 DIAGNOSIS — E1165 Type 2 diabetes mellitus with hyperglycemia: Secondary | ICD-10-CM | POA: Diagnosis not present

## 2016-11-22 DIAGNOSIS — R531 Weakness: Secondary | ICD-10-CM

## 2016-11-22 DIAGNOSIS — IMO0002 Reserved for concepts with insufficient information to code with codable children: Secondary | ICD-10-CM

## 2016-11-22 LAB — COMPREHENSIVE METABOLIC PANEL
ALBUMIN: 4 g/dL (ref 3.5–5.2)
ALT: 30 U/L (ref 0–53)
AST: 33 U/L (ref 0–37)
Alkaline Phosphatase: 45 U/L (ref 39–117)
BILIRUBIN TOTAL: 0.3 mg/dL (ref 0.2–1.2)
BUN: 7 mg/dL (ref 6–23)
CALCIUM: 8.6 mg/dL (ref 8.4–10.5)
CO2: 29 mEq/L (ref 19–32)
CREATININE: 0.78 mg/dL (ref 0.40–1.50)
Chloride: 101 mEq/L (ref 96–112)
GFR: 108.89 mL/min (ref >60.00)
Glucose, Bld: 294 mg/dL — ABNORMAL HIGH (ref 70–99)
Potassium: 3.6 mEq/L (ref 3.5–5.1)
Sodium: 136 mEq/L (ref 135–145)
Total Protein: 6.4 g/dL (ref 6.0–8.3)

## 2016-11-22 LAB — TROPONIN I: TNIDX: 0 ug/L (ref 0.00–0.06)

## 2016-11-22 LAB — HEMOGLOBIN A1C: Hgb A1c MFr Bld: 9.5 % — ABNORMAL HIGH (ref 4.6–6.5)

## 2016-11-22 LAB — BRAIN NATRIURETIC PEPTIDE: Pro B Natriuretic peptide (BNP): 20 pg/mL (ref 0.0–100.0)

## 2016-11-22 MED ORDER — TRAMADOL HCL 50 MG PO TABS: ORAL_TABLET | ORAL | 0 refills | Status: DC

## 2016-11-22 MED ORDER — ONDANSETRON 4 MG PO TBDP: 4.0000 mg | ORAL_TABLET | Freq: Three times a day (TID) | ORAL | 0 refills | Status: DC | PRN

## 2016-11-22 MED ORDER — CYANOCOBALAMIN 1000 MCG/ML IJ SOLN
1000.0000 ug | Freq: Once | INTRAMUSCULAR | Status: AC
  Administered 2016-11-22: 1000 ug via INTRAMUSCULAR

## 2016-11-22 NOTE — Progress Notes (Signed)
Subjective:    Patient ID: Bryan Becker, male    DOB: Dec 18, 1960, 56 y.o.   MRN: [id number]  Chief Complaint  Patient presents with  . Hospitalization Follow-up    has been feeling very weak and tired, cough and does not feel any better, dx with CHF, refill of tramadol and nausea med    HPI:  Bryan Becker is a 56 y.o. male who  has a past medical history of Anxiety; Arthritis; Asthma; CHF (congestive heart failure) ([place]); Chronic back pain; Chronic bronchitis ([place]); Cirrhosis ([place]); Confusion; COPD (chronic obstructive pulmonary disease) ([place]); Dementia; Dementia; Depression; Fall at home (06/04/14); Family history of adverse reaction to anesthesia; Fibromyalgia; Frequent urination; Gastric ulcer; GERD (gastroesophageal reflux disease); Headache(784.0); Hyperlipidemia; Hypertension; Kidney stone; Myocardial infarction [facility]); Nausea & vomiting; OSA (obstructive sleep apnea) (04/24/2013); Pancreatitis; Pneumonia; [facility] spotted fever) (11/22/2012); Seasonal allergies; Shortness of breath dyspnea; Stroke [facility]) (03/2013); Stroke [facility]) (12/04/2013); and Type II diabetes mellitus ([place]). and presents today for a follow up office visit.  Recently evaluated in the emergency department and admitted to the hospital for observation following developing atypical chest pain with cardiac rhythm monitoring showing sinus rhythm without noted ectopy and underwent a Myoview cardiac imaging with no evidence of reversible ischemia. He was noted to have good systolic function with the presumptive diagnosis of chronic diastolic heart failure with no evidence of decompensation. His ejection fraction was estimated at 65%. All hospital records, labs, and imaging reviewed in detail.  Since leaving the hospital he continues to experience associated symptoms of weakness and fatigue as well as cough. He continues to wait on a letter for his BiPap machine and not sleeping very well at night. Symptoms have worsened  with the increased heat. Continues to have pressure in his chest with jaw pain on occasion. Severity is enough that he "has to stay in the chair" because of the lack of energy. Has joined [employer] online and working on improving his nutritional intake.   Allergies  Allergen Reactions  . Actos [Pioglitazone] Shortness Of Breath    Caused patient to be in CHF  . Aricept [Donepezil Hcl] Diarrhea      Outpatient Medications Prior to Visit  Medication Sig Dispense Refill  . ACCU-CHEK AVIVA PLUS test strip USE TO CHECK BLOOD-SUGAR UP TO 4 TIMES DAILY (DX CODE E11.65) 100 each 5  . ACCU-CHEK SOFTCLIX LANCETS lancets USE TO CHECK BLOOD-SUGAR UP TO 4 TIMES DAILY (DX CODE E11.65) 100 each 5  . albuterol (ACCUNEB) 1.25 MG/3ML nebulizer solution Take 1 ampule by nebulization every 6 (six) hours as needed for wheezing.    . bisoprolol (ZEBETA) 5 MG tablet One twice daily (Patient taking differently: Take 5 mg by mouth 2 (two) times daily. ) 180 tablet 3  . blood glucose meter kit and supplies KIT Dispense based on patient and insurance preference. Use up to four times daily as directed. Dx Code: E11.65 1 each 0  . Cholecalciferol (VITAMIN D3) 50000 units CAPS Take 50,000 Units by mouth once a week. 12 capsule 0  . citalopram (CELEXA) 40 MG tablet TAKE 1 TABLET (40 MG TOTAL) BY MOUTH DAILY. 90 tablet 0  . clopidogrel (PLAVIX) 75 MG tablet Take 1 tablet (75 mg total) by mouth daily. 90 tablet 0  . cyclobenzaprine (FLEXERIL) 10 MG tablet Take 1 tablet (10 mg total) by mouth 3 (three) times daily as needed for muscle spasms. 90 tablet 0  . docusate sodium (COLACE) 100 MG capsule Take  100 mg by mouth daily as needed for mild constipation.    . fenofibrate 160 MG tablet Take 160 mg by mouth daily.    . fexofenadine (ALLEGRA) 180 MG tablet Take 180 mg by mouth daily.    . finasteride (PROSCAR) 5 MG tablet Take 5 mg by mouth daily.    . fluticasone (FLONASE) 50 MCG/ACT nasal spray Place 2 sprays into  both nostrils daily as needed for allergies.     . furosemide (LASIX) 40 MG tablet TAKE 1 TO 2 TABLETS TWICE DAILY AS DIRECTED 360 tablet 2  . gabapentin (NEURONTIN) 600 MG tablet Take 2 tablets (1,200 mg total) by mouth 3 (three) times daily. 540 tablet 1  . glimepiride (AMARYL) 2 MG tablet TAKE 1 TABLET (2 MG TOTAL) BY MOUTH DAILY. 90 tablet 0  . insulin detemir (LEVEMIR) 100 UNIT/ML injection Inject 0.6 mLs (60 Units total) into the skin at bedtime. 30 mL 3  . irbesartan (AVAPRO) 300 MG tablet Take 1 tablet (300 mg total) by mouth daily. (Patient taking differently: Take 300 mg by mouth 2 (two) times daily. ) 90 tablet 1  . L-Lysine 1000 MG TABS Take 1,000 mg by mouth daily as needed (for fever blisters).    . LEVEMIR 100 UNIT/ML injection INJECT 0.6ML (60 UNITS TOTAL) SUBCUTANEOUSLY AT BEDTIME 30 mL 5  . liraglutide 18 MG/3ML SOPN Inject 0.3 mLs (1.8 mg total) into the skin daily. 27 mL 3  . LORazepam (ATIVAN) 1 MG tablet TAKE 1 TABLET BY MOUTH EVERY EIGHT HOURSAS NEEDED FOR ANXIETY 15 tablet 0  . memantine (NAMENDA) 10 MG tablet TAKE 1 TABLET (10 MG TOTAL) BY MOUTH 2 (TWO) TIMES DAILY. 180 tablet 0  . NOVOLOG FLEXPEN 100 UNIT/ML FlexPen INJECT 20 TO 30 UNITS INTO THE SKIN 4 (FOUR) TIMES DAILY. 90 mL 1  . OXYGEN 2lpm with sleep and occ as needed during the day [employer]    . pantoprazole (PROTONIX) 40 MG tablet TAKE 1 TABLET (40 MG TOTAL) BY MOUTH DAILY. 90 tablet 0  . Potassium Gluconate 595 MG CAPS Take 2 capsules by mouth daily.    . pregabalin (LYRICA) 75 MG capsule Take 1 capsule (75 mg total) by mouth 2 (two) times daily. 20 capsule 0  . promethazine (PHENERGAN) 25 MG tablet Take 1 tablet (25 mg total) by mouth every 6 (six) hours as needed for nausea or vomiting. 15 tablet 0  . Specialty Vitamins Products (MAGNESIUM, AMINO ACID CHELATE,) 133 MG tablet Take 1 tablet by mouth 2 (two) times daily.    . sucralfate (CARAFATE) 1 g tablet Take 1 g by mouth 4 (four) times daily -  with meals and  at bedtime.    Marland Kitchen umeclidinium-vilanterol (ANORO ELLIPTA) 62.5-25 MCG/INH AEPB Inhale 1 puff into the lungs daily. 3 each 3  . VENTOLIN HFA 108 (90 Base) MCG/ACT inhaler INHALE 2 PUFFS EVERY 6 HOURS AS NEEDED 54 g 2  . cyclobenzaprine (FLEXERIL) 10 MG tablet TAKE 1 TABLET BY MOUTH 3 TIMES DAILY AS NEEDED FOR MUSCLE SPASMS 30 tablet 0  . traMADol (ULTRAM) 50 MG tablet TAKE 1 TO 2 TABLETS BY MOUTH EVERY 8 HOURS AS NEEDED FOR MODERATE PAIN 180 tablet 0   No facility-administered medications prior to visit.       Past Surgical History:  Procedure Laterality Date  . CARDIAC CATHETERIZATION     10/28/05: right dominant, normal LV systolic function, normal coronaries. (Dr. Baxter Hire, [facility])  . CHOLECYSTECTOMY    . COLONOSCOPY    .  COLONOSCOPY WITH PROPOFOL N/A 07/08/2016   Procedure: COLONOSCOPY WITH PROPOFOL;  Surgeon: Gatha Mayer, MD;  Location: [facility] END[facility]COPY;  Service: Endoscopy;  Laterality: N/A;  . CYSTOSCOPY W/ STONE MANIPULATION    . LUMBAR LAMINECTOMY/DECOMPRESSION MICRODISCECTOMY Left 06/18/2014   Procedure: LUMBAR LAMINECTOMY/DECOMPRESSION MICRODISCECTOMY LEFT LUMBAR FIVE-SACRAL ONE;  Surgeon: Ashok Pall, MD;  Location: [place] NEURO ORS;  Service: Neurosurgery;  Laterality: Left;  left  . multiple cysts removal-hip,wrist    . TONSILLECTOMY  ~ 1985  . UPPER GASTROINTESTINAL ENDOSCOPY        Past Medical History:  Diagnosis Date  . Anxiety   . Arthritis    "joints"  . Asthma   . CHF (congestive heart failure) ([place])   . Chronic back pain    "mostly lower; have 2 bulging discs in my neck" (12/05/2013)  . Chronic bronchitis ([place])    "get it q yr"  . 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 Confusion   . COPD (chronic obstructive pulmonary disease) ([place])   . Dementia    onset of early  . Dementia   . Depression   . Fall at home 06/04/14   Pt stated he falls off the bed at nightly  . Family history of adverse reaction to anesthesia    Patients mother had a  bad reaction to Anesthesia however pt is unaware of reaction  . Fibromyalgia   . Frequent urination   . Gastric ulcer   . GERD (gastroesophageal reflux disease)   . VQQVZDGL(875.6)    "weekly" (12/04/2013)  . Hyperlipidemia   . Hypertension   . Kidney stone   . Myocardial infarction [facility])    unsure about MI history, but had normal cath that admission '07 ([facility])  . Nausea & vomiting   . OSA (obstructive sleep apnea) 04/24/2013   "use BiPAP"  . Pancreatitis   . Pneumonia    "several times"  . [facility]F [facility] spotted fever) 11/22/2012  . Seasonal allergies   . Shortness of breath dyspnea    with ambulation  . Stroke [facility]) 03/2013   "still weak on the left side" (12/05/2013)  . Stroke [facility]) 12/04/2013   "came in today w/right sided weakness" (12/04/2013)  . Type II diabetes mellitus ([place])       Review of Systems  Constitutional: Positive for fatigue. Negative for activity change, appetite change, chills and fever.  Respiratory: Negative for chest tightness and shortness of breath.   Cardiovascular: Negative for chest pain, palpitations and leg swelling.  Gastrointestinal: Negative for abdominal pain, diarrhea, nausea and vomiting.  Neurological: Positive for weakness. Negative for light-headedness.      Objective:    BP (!) 150/74 (BP Location: Left Arm, Patient Position: Sitting, Cuff Size: Large)   Pulse 88   Temp 98.6 F (37 C) (Oral)   Resp 20   Ht 5' 9"  (1.753 m)   Wt 289 lb (131.1 kg)   SpO2 97%   BMI 42.68 kg/m  Nursing note and vital signs reviewed.  Physical Exam  Constitutional: He is oriented to person, place, and time. He appears well-developed and well-nourished. No distress.  Cardiovascular: Normal rate, regular rhythm, normal heart sounds and intact distal pulses.   Pulmonary/Chest: Effort normal and breath sounds normal.  Neurological: He is alert and oriented to person, place, and time.  Skin: Skin is warm and dry.  Psychiatric: He has a normal  mood and affect. His behavior is normal. Judgment and thought content normal.  Assessment & Plan:   Problem List Items Addressed This Visit      Cardiovascular and Mediastinum   Diastolic heart failure (HCC)    Obtain BNP. Does not appear to be fluid overloaded on exam with no adventitious lung sounds or significant swelling. Continue current dosage of furosemide, Maintained on bisoprolol for decreased risk of sudden cardiac death. Continue to monitor weight at home.      Relevant Orders   B Nat Peptide (Completed)   Comprehensive metabolic panel (Completed)     Endocrine   Diabetes mellitus type II, uncontrolled ([place]) - Primary    Obtain hemoglobin A1c. Continue current dosage of Levemir and liraglutide and Novolog. Monitor blood sugars at home and follow low carbohydrate diet.       Relevant Orders   Hemoglobin A1c (Completed)   Comprehensive metabolic panel (Completed)     Other   Weakness    Weakness and fatigue likely related to chronic conditions and uncontrolled sleep apnea. Will check Troponin, BNP and CMET. Continue with improving nutritional intake. Follow up with pulmonology for BiPap. Continue to monitor pending blood work results.       Relevant Orders   Troponin I (Completed)   B12 deficiency   Relevant Medications   cyanocobalamin ((VITAMIN B-12)) injection 1,000 mcg (Completed)       I am having Mr. Kassel start on ondansetron. I am also having him maintain his Potassium Gluconate, fluticasone, promethazine, L-Lysine, magnesium (amino acid chelate), docusate sodium, fexofenadine, finasteride, albuterol, OXYGEN, irbesartan, umeclidinium-vilanterol, bisoprolol, VENTOLIN HFA, sucralfate, fenofibrate, furosemide, liraglutide, insulin detemir, gabapentin, blood glucose meter kit and supplies, citalopram, memantine, glimepiride, NOVOLOG FLEXPEN, Vitamin D3, pregabalin, LORazepam, pantoprazole, LEVEMIR, ACCU-CHEK AVIVA PLUS, ACCU-CHEK SOFTCLIX LANCETS,  clopidogrel, cyclobenzaprine, and traMADol. We administered cyanocobalamin.   Meds ordered this encounter  Medications  . traMADol (ULTRAM) 50 MG tablet    Sig: TAKE 1 TO 2 TABLETS BY MOUTH EVERY 8 HOURS AS NEEDED FOR MODERATE PAIN    Dispense:  180 tablet    Refill:  0    Order Specific Question:   Supervising Provider    Answer:   Pricilla Holm A [[id number]]  . ondansetron (ZOFRAN ODT) 4 MG disintegrating tablet    Sig: Take 1 tablet (4 mg total) by mouth every 8 (eight) hours as needed for nausea or vomiting.    Dispense:  20 tablet    Refill:  0    Order Specific Question:   Supervising Provider    Answer:   Pricilla Holm A [[id number]]  . cyanocobalamin ((VITAMIN B-12)) injection 1,000 mcg     Follow-up: Return in about 3 months (around 02/22/2017), or if symptoms worsen or fail to improve.  Mauricio Po, FNP

## 2016-11-22 NOTE — Assessment & Plan Note (Signed)
Weakness and fatigue likely related to chronic conditions and uncontrolled sleep apnea. Will check Troponin, BNP and CMET. Continue with improving nutritional intake. Follow up with pulmonology for BiPap. Continue to monitor pending blood work results.

## 2016-11-22 NOTE — Patient Instructions (Addendum)
Thank you for choosing [facility].  SUMMARY AND INSTRUCTIONS:  Please continue to take your medication as prescribed.   Start the Zofran as needed for nausea.   Please obtain your sleep study from Dr. Maxie Barb office:  [address] [place], [place] [address] [phone]  Follow up with Dr. Annamaria Boots for your BiPap machine.   Continue to work on nutritional changes.   Medication:  Your prescription(s) have been submitted to your pharmacy or been printed and provided for you. Please take as directed and contact our office if you believe you are having problem(s) with the medication(s) or have any questions.  Labs:  Please stop by the lab on the lower level of the building for your blood work. Your results will be released to [place] (or called to you) after review, usually within 72 hours after test completion. If any changes need to be made, you will be notified at that same time.  1.) The lab is open from 7:30am to 5:30 pm Monday-Friday 2.) No appointment is necessary 3.) Fasting (if needed) is 6-8 hours after food and drink; black coffee and water are okay    Follow up:  If your symptoms worsen or fail to improve, please contact our office for further instruction, or in case of emergency go directly to the emergency room at the closest medical facility.

## 2016-11-22 NOTE — Assessment & Plan Note (Signed)
Obtain BNP. Does not appear to be fluid overloaded on exam with no adventitious lung sounds or significant swelling. Continue current dosage of furosemide, Maintained on bisoprolol for decreased risk of sudden cardiac death. Continue to monitor weight at home.

## 2016-11-22 NOTE — Assessment & Plan Note (Signed)
Obtain hemoglobin A1c. Continue current dosage of Levemir and liraglutide and Novolog. Monitor blood sugars at home and follow low carbohydrate diet.

## 2016-11-23 ENCOUNTER — Encounter: Payer: Self-pay | Admitting: Family

## 2016-11-24 ENCOUNTER — Telehealth: Payer: Self-pay | Admitting: Internal Medicine

## 2016-11-24 DIAGNOSIS — G4733 Obstructive sleep apnea (adult) (pediatric): Secondary | ICD-10-CM

## 2016-11-24 NOTE — Telephone Encounter (Signed)
Study obtained from up front and placed on CY cart to review.  Please advise Dr Annamaria Boots. Thanks.

## 2016-11-25 NOTE — Telephone Encounter (Signed)
Spoke with patient. He is aware of CY's recs. Will place order for replacement BIPAP machine. He verbalized understanding. Nothing further needed at time of call.

## 2016-11-25 NOTE — Telephone Encounter (Signed)
We have his CPAP/  BIPAP titration report from 07/2015 which I will send to be scanned. I will set pressure based on that report. He has been using [facility]. Ok to order replacement BIPAP machine 13/9, mask of choice, humidifier, supplies, AirView    Dx OSA

## 2016-11-26 ENCOUNTER — Encounter (HOSPITAL_BASED_OUTPATIENT_CLINIC_OR_DEPARTMENT_OTHER): Payer: Self-pay | Admitting: Emergency Medicine

## 2016-11-26 ENCOUNTER — Emergency Department (HOSPITAL_BASED_OUTPATIENT_CLINIC_OR_DEPARTMENT_OTHER)
Admission: EM | Admit: 2016-11-26 | Discharge: 2016-11-27 | Disposition: A | Discharge status: Home / Self Care | Payer: Medicare HMO | Attending: Emergency Medicine | Admitting: Emergency Medicine

## 2016-11-26 ENCOUNTER — Emergency Department (HOSPITAL_BASED_OUTPATIENT_CLINIC_OR_DEPARTMENT_OTHER): Payer: Medicare HMO

## 2016-11-26 DIAGNOSIS — Z79899 Other long term (current) drug therapy: Secondary | ICD-10-CM | POA: Insufficient documentation

## 2016-11-26 DIAGNOSIS — S93505A Unspecified sprain of left lesser toe(s), initial encounter: Secondary | ICD-10-CM | POA: Insufficient documentation

## 2016-11-26 DIAGNOSIS — Y929 Unspecified place or not applicable: Secondary | ICD-10-CM | POA: Diagnosis not present

## 2016-11-26 DIAGNOSIS — J45909 Unspecified asthma, uncomplicated: Secondary | ICD-10-CM | POA: Diagnosis not present

## 2016-11-26 DIAGNOSIS — S99922A Unspecified injury of left foot, initial encounter: Secondary | ICD-10-CM | POA: Diagnosis present

## 2016-11-26 DIAGNOSIS — I110 Hypertensive heart disease with heart failure: Secondary | ICD-10-CM | POA: Insufficient documentation

## 2016-11-26 DIAGNOSIS — Y939 Activity, unspecified: Secondary | ICD-10-CM | POA: Diagnosis not present

## 2016-11-26 DIAGNOSIS — W19XXXA Unspecified fall, initial encounter: Secondary | ICD-10-CM | POA: Insufficient documentation

## 2016-11-26 DIAGNOSIS — R739 Hyperglycemia, unspecified: Secondary | ICD-10-CM

## 2016-11-26 DIAGNOSIS — J441 Chronic obstructive pulmonary disease with (acute) exacerbation: Secondary | ICD-10-CM

## 2016-11-26 DIAGNOSIS — I509 Heart failure, unspecified: Secondary | ICD-10-CM | POA: Diagnosis not present

## 2016-11-26 DIAGNOSIS — Z87891 Personal history of nicotine dependence: Secondary | ICD-10-CM | POA: Insufficient documentation

## 2016-11-26 DIAGNOSIS — Y92009 Unspecified place in unspecified non-institutional (private) residence as the place of occurrence of the external cause: Secondary | ICD-10-CM

## 2016-11-26 DIAGNOSIS — Z8673 Personal history of transient ischemic attack (TIA), and cerebral infarction without residual deficits: Secondary | ICD-10-CM | POA: Diagnosis not present

## 2016-11-26 DIAGNOSIS — E1165 Type 2 diabetes mellitus with hyperglycemia: Secondary | ICD-10-CM | POA: Diagnosis not present

## 2016-11-26 DIAGNOSIS — Y999 Unspecified external cause status: Secondary | ICD-10-CM | POA: Insufficient documentation

## 2016-11-26 DIAGNOSIS — Z794 Long term (current) use of insulin: Secondary | ICD-10-CM | POA: Diagnosis not present

## 2016-11-26 LAB — CBG MONITORING, ED: Glucose-Capillary: 335 mg/dL — ABNORMAL HIGH (ref 65–99)

## 2016-11-26 MED ORDER — IPRATROPIUM-ALBUTEROL 0.5-2.5 (3) MG/3ML IN SOLN
3.0000 mL | RESPIRATORY_TRACT | Status: DC
  Administered 2016-11-26: 3 mL via RESPIRATORY_TRACT
  Filled 2016-11-26: qty 3

## 2016-11-26 NOTE — ED Provider Notes (Signed)
[facility] [facility] DEPT [facility] Provider Note: Georgena Spurling, MD, FACEP  CSN: [id number] MRN: [id number] ARRIVAL: 11/26/16 at 2234 ROOM: [place]  Bryan Becker is a 56 y.o. male with multiple medical problems who fell yesterday. When he fell he landed on his chest and is having midsternal tenderness. He also has pain and ecchymosis of his left second and third toes. He is having 8 out of 10 pain when he attempts to ambulate on that foot. He is still able to move these toes and has intact sensation. He also complains of shortness of breath which is not severe but worse than his baseline. There is some associated chest tightness but no pain. He also feels swollen all over and states his face feels like it is on fire. He also noted his blood sugar was elevated this evening at 441. He took 45 units of NovoLog about an hour prior to arrival. On arrival his sugar was 335. He has taken ibuprofen and tramadol for his toe pain with some relief.  Lab work done 4 days ago showed normal troponin, normal brain natruretic peptide and normal CMET apart from elevated glucose.  Past Medical History:  Diagnosis Date  . Anxiety   . Arthritis    "joints"  . Asthma   . CHF (congestive heart failure) ([place])   . Chronic back pain    "mostly lower; have 2 bulging discs in my neck" (12/05/2013)  . Chronic bronchitis ([place])    "get it q yr"  . 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 Confusion   . COPD (chronic obstructive pulmonary disease) ([place])   . Dementia    onset of early  . Dementia   . Depression   . Fall at home 06/04/14   Pt stated he falls off the bed at nightly  . Family history of adverse reaction to anesthesia    Patients mother had a bad reaction to Anesthesia however pt is unaware of reaction  . Fibromyalgia   . Frequent urination   . Gastric ulcer   . GERD (gastroesophageal reflux disease)   . WNIOEVOJ(500.9)    "weekly" (12/04/2013)  . Hyperlipidemia   . Hypertension   . Kidney stone   . Myocardial infarction [facility])    unsure about MI history, but had normal cath that admission '07 ([facility])  . Nausea & vomiting   . OSA (obstructive sleep apnea) 04/24/2013   "use BiPAP"  . Pancreatitis   . Pneumonia    "several times"  . RMSF [facility] spotted fever) 11/22/2012  . Seasonal allergies   . Shortness of breath dyspnea    with ambulation  . Stroke [facility]) 03/2013   "still weak on the left side" (12/05/2013)  . Stroke [facility]) 12/04/2013   "came in today w/right sided weakness" (12/04/2013)  . Type II diabetes mellitus ([place])     Past Surgical History:  Procedure Laterality Date  . CARDIAC CATHETERIZATION     10/28/05: right dominant, normal LV systolic function, normal coronaries. (Dr. Baxter Hire, [facility])  . CHOLECYSTECTOMY    . COLONOSCOPY    . COLONOSCOPY WITH PROPOFOL N/A 07/08/2016   Procedure: COLONOSCOPY WITH PROPOFOL;  Surgeon: Gatha Mayer, MD;  Location: [facility] ENDOSCOPY;  Service: Endoscopy;  Laterality: N/A;  . CYSTOSCOPY W/ STONE MANIPULATION    . LUMBAR LAMINECTOMY/DECOMPRESSION MICRODISCECTOMY Left 06/18/2014   Procedure: LUMBAR LAMINECTOMY/DECOMPRESSION MICRODISCECTOMY LEFT LUMBAR FIVE-SACRAL ONE;  Surgeon: Ashok Pall, MD;  Location: [place] NEURO ORS;  Service: Neurosurgery;  Laterality: Left;  left  . multiple cysts removal-hip,wrist    . TONSILLECTOMY  ~ 1985  . UPPER GASTROINTESTINAL ENDOSCOPY      Family History  Problem Relation Age of Onset  . Skin cancer Father   . Heart attack Father   . Stomach cancer Father   . Hypertension Father   . Aneurysm Father   . Lung cancer Paternal Grandfather   . Heart disease Paternal Grandfather   . Stomach cancer Maternal Grandfather   . Bone cancer Paternal Grandmother   . Heart attack Paternal Grandmother   . Diabetes Mother   . Hypertension Mother   . Arthritis Mother   . Other Mother        brain stem vertigo/disorder of pancreas  .  Diabetes Maternal Grandmother   . Heart disease Maternal Grandmother   . Aneurysm Maternal Uncle   . Aneurysm Maternal Uncle   . Colon cancer Neg Hx   . Esophageal cancer Neg Hx   . Rectal cancer Neg Hx     Social History  Substance Use Topics  . Smoking status: Former Smoker    Packs/day: 2.00    Years: 21.00    Types: Cigarettes    Quit date: 05/23/2002  . Smokeless tobacco: Never Used  . Alcohol use No    Prior to Admission medications   Medication Sig Start Date End Date Taking? Authorizing Provider  ACCU-CHEK AVIVA PLUS test strip USE TO CHECK BLOOD-SUGAR UP TO 4 TIMES DAILY (DX CODE E11.65) 10/27/16   Golden Circle, FNP  ACCU-CHEK SOFTCLIX LANCETS lancets USE TO CHECK BLOOD-SUGAR UP TO 4 TIMES DAILY (DX CODE E11.65) 10/27/16   Golden Circle, FNP  albuterol (ACCUNEB) 1.25 MG/3ML nebulizer solution Take 1 ampule by nebulization every 6 (six) hours as needed for wheezing.    [provider]  bisoprolol (ZEBETA) 5 MG tablet One twice daily Patient taking differently: Take 5 mg by mouth 2 (two) times daily.  05/26/16   Tanda Rockers, MD  blood glucose meter kit and supplies KIT Dispense based on patient and insurance preference. Use up to four times daily as directed. Dx Code: E11.65 09/19/16   Golden Circle, FNP  Cholecalciferol (VITAMIN D3) 50000 units CAPS Take 50,000 Units by mouth once a week. 10/12/16   Golden Circle, FNP  citalopram (CELEXA) 40 MG tablet TAKE 1 TABLET (40 MG TOTAL) BY MOUTH DAILY. 10/07/16   Golden Circle, FNP  clopidogrel (PLAVIX) 75 MG tablet Take 1 tablet (75 mg total) by mouth daily. 11/03/16   Golden Circle, FNP  cyclobenzaprine (FLEXERIL) 10 MG tablet Take 1 tablet (10 mg total) by mouth 3 (three) times daily as needed for muscle spasms. 11/03/16   Golden Circle, FNP  docusate sodium (COLACE) 100 MG capsule Take 100 mg by mouth daily as needed for mild constipation.    [provider]  fenofibrate 160 MG tablet Take  160 mg by mouth daily. 06/22/16   [provider]  fexofenadine (ALLEGRA) 180 MG tablet Take 180 mg by mouth daily.    [provider]  finasteride (PROSCAR) 5 MG tablet Take 5 mg by mouth daily.    [provider]  fluticasone (FLONASE) 50 MCG/ACT nasal spray Place 2 sprays into both nostrils daily as needed for allergies.     [provider]  furosemide (LASIX) 40 MG tablet TAKE 1 TO 2  TABLETS TWICE DAILY AS DIRECTED 07/27/16   Croitoru, Dani Gobble, MD  gabapentin (NEURONTIN) 600 MG tablet Take 2 tablets (1,200 mg total) by mouth 3 (three) times daily. 09/14/16   Golden Circle, FNP  glimepiride (AMARYL) 2 MG tablet TAKE 1 TABLET (2 MG TOTAL) BY MOUTH DAILY. 10/07/16   Golden Circle, FNP  insulin detemir (LEVEMIR) 100 UNIT/ML injection Inject 0.6 mLs (60 Units total) into the skin at bedtime. 09/09/16   Golden Circle, FNP  irbesartan (AVAPRO) 300 MG tablet Take 1 tablet (300 mg total) by mouth daily. Patient taking differently: Take 300 mg by mouth 2 (two) times daily.  04/22/16   Tanda Rockers, MD  L-Lysine 1000 MG TABS Take 1,000 mg by mouth daily as needed (for fever blisters).    [provider]  LEVEMIR 100 UNIT/ML injection INJECT 0.6ML (60 UNITS TOTAL) SUBCUTANEOUSLY AT BEDTIME 10/27/16   Golden Circle, FNP  liraglutide 18 MG/3ML SOPN Inject 0.3 mLs (1.8 mg total) into the skin daily. 08/15/16   Golden Circle, FNP  LORazepam (ATIVAN) 1 MG tablet TAKE 1 TABLET BY MOUTH EVERY EIGHT HOURSAS NEEDED FOR ANXIETY 10/19/16   Golden Circle, FNP  memantine (NAMENDA) 10 MG tablet TAKE 1 TABLET (10 MG TOTAL) BY MOUTH 2 (TWO) TIMES DAILY. 10/07/16   Golden Circle, FNP  NOVOLOG FLEXPEN 100 UNIT/ML FlexPen INJECT 20 TO 30 UNITS INTO THE SKIN 4 (FOUR) TIMES DAILY. 10/07/16   Golden Circle, FNP  ondansetron (ZOFRAN ODT) 4 MG disintegrating tablet Take 1 tablet (4 mg total) by mouth every 8 (eight) hours as needed for nausea or vomiting. 11/22/16    Golden Circle, FNP  OXYGEN 2lpm with sleep and occ as needed during the day [employer]    [provider]  pantoprazole (PROTONIX) 40 MG tablet TAKE 1 TABLET (40 MG TOTAL) BY MOUTH DAILY. 10/24/16   Golden Circle, FNP  Potassium Gluconate 595 MG CAPS Take 2 capsules by mouth daily.    [provider]  pregabalin (LYRICA) 75 MG capsule Take 1 capsule (75 mg total) by mouth 2 (two) times daily. 10/12/16   Golden Circle, FNP  promethazine (PHENERGAN) 25 MG tablet Take 1 tablet (25 mg total) by mouth every 6 (six) hours as needed for nausea or vomiting. 02/11/14   Ward, Delice Bison, DO  Specialty Vitamins Products (MAGNESIUM, AMINO ACID CHELATE,) 133 MG tablet Take 1 tablet by mouth 2 (two) times daily.    [provider]  sucralfate (CARAFATE) 1 g tablet Take 1 g by mouth 4 (four) times daily -  with meals and at bedtime.    [provider]  traMADol (ULTRAM) 50 MG tablet TAKE 1 TO 2 TABLETS BY MOUTH EVERY 8 HOURS AS NEEDED FOR MODERATE PAIN 11/22/16   Golden Circle, FNP  umeclidinium-vilanterol (ANORO ELLIPTA) 62.5-25 MCG/INH AEPB Inhale 1 puff into the lungs daily. 05/24/16   Tanda Rockers, MD  VENTOLIN HFA 108 (90 Base) MCG/ACT inhaler INHALE 2 PUFFS EVERY 6 HOURS AS NEEDED 06/17/16   Parrett, Fonnie Mu, NP    Allergies Actos [pioglitazone] and Aricept [donepezil hcl]   REVIEW OF SYSTEMS  Negative except as noted here or in the History of Present Illness.   PHYSICAL EXAMINATION  Initial Vital Signs Blood pressure (!) 151/85, pulse 91, temperature 98.2 F (36.8 C), temperature source Oral, resp. rate (!) 21, SpO2 100 %.  Examination General: Well-developed, obese male in no acute distress; appearance consistent  with age of record HENT: normocephalic; atraumatic Eyes: pupils equal, round and reactive to light; extraocular muscles intact Neck: supple Heart: regular rate and rhythm Lungs: clear to auscultation bilaterally Chest: Midsternal  tenderness without deformity or crepitus Abdomen: soft; nondistended; nontender; bowel sounds present Extremities: No deformity; full range of motion; pulses normal; trace edema of lower legs; ecchymosis and tenderness of left second and third toes, flexion and extension intact, sensation intact with brisk capillary refill Neurologic: Awake, alert and oriented; motor function intact in all extremities and symmetric; no facial droop Skin: Warm and dry Psychiatric: Normal mood and affect   RESULTS  Summary of this visit's results, reviewed by myself:   EKG Interpretation  Date/Time:  Saturday November 26 2016 22:54:48 EDT Ventricular Rate:  90 PR Interval:    QRS Duration: 95 QT Interval:  374 QTC Calculation: 458 R Axis:   47 Text Interpretation:  Sinus rhythm Probable left atrial enlargement No significant change was found Confirmed by Dagon Budai [phone]) on 11/26/2016 10:58:02 PM      Laboratory Studies: Results for orders placed or performed during the hospital encounter of 11/26/16 (from the past 24 hour(s))  CBG monitoring, ED     Status: Abnormal   Collection Time: 11/26/16 10:46 PM  Result Value Ref Range   Glucose-Capillary 335 (H) 65 - 99 mg/dL   Imaging Studies: Dg Chest 2 View  Result Date: 11/27/2016 CLINICAL DATA:  Initial evaluation for acute respiratory difficulty. EXAM: CHEST  2 VIEW COMPARISON:  Prior radiograph from 03/02/2016. FINDINGS: Mild cardiomegaly, stable. Mediastinal silhouette within normal limits. Lungs mildly hypoinflated. Scattered peribronchial thickening, likely related to history of COPD. Superimposed mild left basilar atelectasis. No focal infiltrates. No pulmonary edema or pleural effusion. No pneumothorax. No acute osseous abnormality. Scattered multilevel degenerate spondylolysis and within the visualized spine. IMPRESSION: 1. COPD with mild left basilar atelectasis. 2. No other active cardiopulmonary disease. Electronically Signed   By: Jeannine Boga M.D.   On: 11/27/2016 00:20   Dg Foot Complete Left  Result Date: 11/27/2016 CLINICAL DATA:  Initial evaluation for acute trauma, fall. Pain and swelling at third, fourth, and fifth toes. EXAM: LEFT FOOT - COMPLETE 3+ VIEW COMPARISON:  None. FINDINGS: No acute fracture or dislocation. No acute soft tissue abnormality. Scattered degenerative osteoarthritic changes present throughout the foot, with prominent osteophytic spur at the first MTP joint. Small posterior plantar calcaneal enthesophytes. Osseous mineralization normal. IMPRESSION: No acute osseous abnormality about the left foot. Electronically Signed   By: Jeannine Boga M.D.   On: 11/27/2016 00:22    ED COURSE  Nursing notes and initial vitals signs, including pulse oximetry, reviewed.  Vitals:   11/26/16 2305 11/26/16 2330 11/27/16 0000 11/27/16 0015  BP: 139/77 139/81 (!) 130/95   Pulse: 91 88 94 87  Resp: _0 Temp:      TempSrc:      SpO2: 99% 96% 90% 95%   12:38 AM Patient feeling better after DuoNeb treatment. CBG down to 268.  PROCEDURES    ED DIAGNOSES     ICD-10-CM   1. Fall in home, initial encounter W19.XXXA    Y92.009   2. Sprain of third toe of left foot, initial encounter S93.505A   3. Sprain of second toe of left foot, initial encounter S93.505A   4. COPD with acute exacerbation ([place]) J44.1   5. Hyperglycemia R73.9        Jehan Bonano, Jenny Reichmann, MD 11/27/16 [id number]

## 2016-11-26 NOTE — ED Triage Notes (Addendum)
PT presents to ED with complaints of left foot pain after fall 2 days ago, hyperglycemia, and shortness of breath. Pt states he checked his sugar and it was 441, he took 45 units of novolog an hour ago.  CBG 335 in triage. Pt reports he feels swollen all over.

## 2016-11-26 NOTE — ED Notes (Signed)
Alert, NAD, calm, interactive, resps e/ mildly labored, speaking in clear shortened phrases, increased wob, mildly labored, skin W&D, VSS.   c/o lower bilateral anterior rib chest pain s/p fall against log, also LLE pain/injury with bruising, also sob with recent admission for CHF, also high BS>400.

## 2016-11-27 LAB — CBG MONITORING, ED: GLUCOSE-CAPILLARY: 268 mg/dL — AB (ref 65–99)

## 2016-11-27 NOTE — ED Notes (Signed)
No changes. Resting comfortably. States, "feel better, breathing easier". NAD, calm, interactive.

## 2016-11-27 NOTE — ED Notes (Signed)
Dressed and ready to go, wearing ortho shoe, "foot feels better", "breathing feels better", NAD, calm, steady gait with ortho shoe. Denies questions or needs.

## 2016-12-07 ENCOUNTER — Institutional Professional Consult (permissible substitution): Payer: Medicare HMO | Admitting: Pulmonary Disease

## 2016-12-09 ENCOUNTER — Other Ambulatory Visit: Payer: Self-pay | Admitting: Internal Medicine

## 2016-12-09 ENCOUNTER — Other Ambulatory Visit: Payer: Self-pay | Admitting: Adult Health

## 2016-12-09 ENCOUNTER — Other Ambulatory Visit: Payer: Self-pay | Admitting: Family

## 2016-12-09 DIAGNOSIS — M79602 Pain in left arm: Secondary | ICD-10-CM

## 2016-12-09 DIAGNOSIS — M797 Fibromyalgia: Secondary | ICD-10-CM

## 2016-12-13 ENCOUNTER — Other Ambulatory Visit: Payer: Self-pay | Admitting: Adult Health

## 2016-12-13 ENCOUNTER — Other Ambulatory Visit: Payer: Self-pay | Admitting: Family

## 2016-12-13 DIAGNOSIS — M797 Fibromyalgia: Secondary | ICD-10-CM

## 2016-12-13 NOTE — Telephone Encounter (Signed)
[facility]...Johny Chess

## 2016-12-15 ENCOUNTER — Other Ambulatory Visit: Payer: Self-pay | Admitting: Family

## 2016-12-15 DIAGNOSIS — M79602 Pain in left arm: Secondary | ICD-10-CM

## 2016-12-15 DIAGNOSIS — M797 Fibromyalgia: Secondary | ICD-10-CM

## 2016-12-19 ENCOUNTER — Ambulatory Visit: Payer: Medicare HMO | Admitting: Family

## 2016-12-23 ENCOUNTER — Other Ambulatory Visit: Payer: Self-pay | Admitting: Family

## 2017-01-17 ENCOUNTER — Other Ambulatory Visit: Payer: Self-pay | Admitting: Family

## 2017-01-17 DIAGNOSIS — M797 Fibromyalgia: Secondary | ICD-10-CM

## 2017-01-17 DIAGNOSIS — M79602 Pain in left arm: Secondary | ICD-10-CM

## 2017-01-31 ENCOUNTER — Other Ambulatory Visit: Payer: Self-pay | Admitting: Family

## 2017-01-31 DIAGNOSIS — M797 Fibromyalgia: Secondary | ICD-10-CM

## 2017-01-31 NOTE — Telephone Encounter (Signed)
Last refill was 12/13/16 with 1 refill left per [facility]

## 2017-02-01 ENCOUNTER — Ambulatory Visit (INDEPENDENT_AMBULATORY_CARE_PROVIDER_SITE_OTHER): Payer: Medicare HMO

## 2017-02-01 ENCOUNTER — Ambulatory Visit (INDEPENDENT_AMBULATORY_CARE_PROVIDER_SITE_OTHER): Payer: Medicare HMO | Admitting: Internal Medicine

## 2017-02-01 ENCOUNTER — Encounter: Payer: Self-pay | Admitting: Internal Medicine

## 2017-02-01 VITALS — BP 142/78 | HR 85 | Ht 69.0 in | Wt 288.6 lb

## 2017-02-01 DIAGNOSIS — J9611 Chronic respiratory failure with hypoxia: Secondary | ICD-10-CM

## 2017-02-01 DIAGNOSIS — G4733 Obstructive sleep apnea (adult) (pediatric): Secondary | ICD-10-CM | POA: Diagnosis not present

## 2017-02-01 DIAGNOSIS — J449 Chronic obstructive pulmonary disease, unspecified: Secondary | ICD-10-CM | POA: Diagnosis not present

## 2017-02-01 DIAGNOSIS — Z23 Encounter for immunization: Secondary | ICD-10-CM

## 2017-02-01 DIAGNOSIS — E538 Deficiency of other specified B group vitamins: Secondary | ICD-10-CM | POA: Diagnosis not present

## 2017-02-01 MED ORDER — CYANOCOBALAMIN 1000 MCG/ML IJ SOLN
1000.0000 ug | Freq: Once | INTRAMUSCULAR | Status: AC
  Administered 2017-02-01: 1000 ug via INTRAMUSCULAR

## 2017-02-01 NOTE — Patient Instructions (Signed)
Order- DME [facility] - please change BIPAP pressures to IMax 18, EMin 4, PS 4     Dx OSA  Flu vax standard  Ok to try without oxygen - you may no longer need it  Please call if we can help

## 2017-02-01 NOTE — Progress Notes (Signed)
10/13/16-56 year old male former smoker being seen to establish for care of OSA complicated by COPD Gold III, , chronic respiratory failure with hypoxia, HBP, DM 2, history CVA NPSG 10/26/14-AHI 40/hour, desaturation to 79%, CPAP titrated to 15, body weight 270 pounds O2 2 L sleep/[facility] SLEEP CONSULT FOR  patient has a BIPAP machine that has been broken about 42month he wass using a friend CPAP machine but does not know the setting and states that it is not working very well for him. In the past CPAP was 18/[facility] with O2 2 L sleep and as needed during the day He had done best with BiPAP based on sleep study down at [place]/ Dr CAlcide Clever where oxygen was added at 2 L.  Without adequate control he is snoring loudly with witnessed apneas and daytime sleepiness. Aware of some movement in sleep, not complex, and seems to be related to respiratory arousals. ENT surgery-tonsils. He says his breathing is very much better now under Dr.Wert's management. Not much cough or wheeze. He takes no sleep medicines. He is followed by cardiology without recent exacerbation. Weight fluctuates with fluid retention.  02/01/17- 89year old male former smoker being seen to establish for care of OSA complicated by COPD Gold III, , chronic respiratory failure with hypoxia, HBP, DM 2, history CVA O2 2 L sleep/[facility] VAUTO BiPAP IMAX 13, EMin 9, PS 4/ [facility] > today increase I Max to 18 Pt states that he has been doing much better since being started on CPAP.  Breathing has been doing better since began on BiPAP. DME: [facility].  Neb albuterol, Anoro, , Ventolin HFA Download compliance 80% averaging over 5 hours use per night, AHI 2.4/hour He had been used to wearing oxygen, but would need help from DME company to connected through his PAP machine.. CPAP titration to 18 on 09/02/16, not qualifying for oxygen on that study. Daytime breathing has improved. Occasional wheeze, little cough. Using rescue inhaler once or twice daily.  Not needing nebulizer. Anoro helps CXR- 11/27/16 IMPRESSION: 1. COPD with mild left basilar atelectasis. 2. No other active cardiopulmonary disease.  ROS-see HPI    "+" = pos Constitutional:    weight loss, night sweats, fevers, chills, + fatigue, lassitude. HEENT:    headaches, difficulty swallowing, tooth/dental problems, sore throat,       sneezing, itching, ear ache, nasal congestion, post nasal drip, snoring CV:    + chest pain, orthopnea, PND, swelling in lower extremities, anasarca,                                                      dizziness, palpitations Resp:   + shortness of breath with exertion or at rest.                productive cough,   non-productive cough, coughing up of blood.              change in color of mucus.  wheezing.   Skin:    rash or lesions. GI:  No-   heartburn, indigestion, abdominal pain, nausea, vomiting, diarrhea,                 change in bowel habits, loss of appetite GU: dysuria, change in color of urine, no urgency or frequency.   flank pain. MS:   + joint pain, stiffness, decreased range of  motion, back pain. Neuro-     nothing unusual Psych:  change in mood or affect.  depression or + anxiety.   memory loss.  OBJ- Physical Exam General- Alert, Oriented, Affect-appropriate, Distress- none acute, + obese Skin- rash-none, lesions- none, excoriation- none Lymphadenopathy- none Head- atraumatic            Eyes- Gross vision intact, PERRLA, conjunctivae and secretions clear            Ears- Hearing, canals-normal            Nose- Clear, no-Septal dev, mucus, polyps, erosion, perforation             Throat- Mallampati IV , mucosa clear , drainage- none, tonsils- atrophic Neck- flexible , trachea midline, no stridor , thyroid nl, carotid no bruit Chest - symmetrical excursion , unlabored           Heart/CV- RRR , no murmur , no gallop  , no rub, nl s1 s2                           - JVD- none , edema- none, stasis changes- none, varices- none            Lung- clear to P&A, wheeze- none, cough- none , dullness-none, rub- none           Chest wall-  Abd-  Br/ Gen/ Rectal- Not done, not indicated Extrem- cyanosis- none, clubbing, none, atrophy- none, strength- nl, + cane Neuro- grossly intact to observation

## 2017-02-03 NOTE — Assessment & Plan Note (Signed)
He did not meet oxygen requirement criteria on CPAP titration study 09/02/16. He had not been using oxygen with his Pap machine recently because he didn't have a tube connected.  He is going to see how he does without oxygen at night and using it as needed in the daytime.

## 2017-02-03 NOTE — Assessment & Plan Note (Signed)
Anoro helpful. Less need for rescue inhaler.

## 2017-02-03 NOTE — Assessment & Plan Note (Addendum)
We are going to try changing his VPAP auto to IMax 18, EMin 9, PS 4, without O2, based on CPAP titration 09/02/16

## 2017-02-08 ENCOUNTER — Other Ambulatory Visit: Payer: Self-pay | Admitting: Family

## 2017-02-09 ENCOUNTER — Other Ambulatory Visit: Payer: Self-pay | Admitting: Family

## 2017-02-09 ENCOUNTER — Other Ambulatory Visit: Payer: Self-pay | Admitting: Internal Medicine

## 2017-02-09 ENCOUNTER — Other Ambulatory Visit: Payer: Self-pay | Admitting: Cardiovascular Disease

## 2017-02-09 DIAGNOSIS — M797 Fibromyalgia: Secondary | ICD-10-CM

## 2017-02-09 DIAGNOSIS — M79602 Pain in left arm: Secondary | ICD-10-CM

## 2017-02-09 NOTE — Telephone Encounter (Signed)
Please check with pharmacy as it looks like it was printed on 9/12. Thanks.

## 2017-02-14 ENCOUNTER — Telehealth: Payer: Self-pay | Admitting: Family

## 2017-02-14 NOTE — Telephone Encounter (Signed)
Pt called requesting a refill on traMADol (ULTRAM) 50 MG tablet. He asked if a 2 week supply could be sent to [facility] to get him through until the regular script is sent to [facility]. Please advise.

## 2017-02-15 ENCOUNTER — Other Ambulatory Visit: Payer: Self-pay | Admitting: Family

## 2017-02-15 ENCOUNTER — Other Ambulatory Visit: Payer: Self-pay

## 2017-02-15 MED ORDER — GLUCOSE BLOOD VI STRP: ORAL_STRIP | 5 refills | Status: AC

## 2017-02-15 MED ORDER — TRAMADOL HCL 50 MG PO TABS: ORAL_TABLET | ORAL | 0 refills | Status: DC

## 2017-02-15 NOTE — Telephone Encounter (Signed)
Pt called regarding this please advise

## 2017-02-15 NOTE — Telephone Encounter (Signed)
Medication printed and ready to be faxed.

## 2017-02-16 MED ORDER — TRAMADOL HCL 50 MG PO TABS: ORAL_TABLET | ORAL | 0 refills | Status: DC

## 2017-02-16 NOTE — Telephone Encounter (Signed)
Is it ok to call in a 2 week supply to his local pharmacy...Bryan Becker

## 2017-02-16 NOTE — Addendum Note (Signed)
Addended by: Earnstine Regal on: 02/16/2017 09:51 AM   Modules accepted: Orders

## 2017-02-16 NOTE — Telephone Encounter (Signed)
Notified pt med has been sent to both pharmacy. Spoke w/Bart @ local [facility] for short term supply...Bryan Becker

## 2017-02-16 NOTE — Telephone Encounter (Signed)
Ok to fill. Needs to plan accordingly next time.

## 2017-02-18 ENCOUNTER — Other Ambulatory Visit: Payer: Self-pay | Admitting: Family

## 2017-02-18 DIAGNOSIS — M797 Fibromyalgia: Secondary | ICD-10-CM

## 2017-02-21 MED ORDER — TRAMADOL HCL 50 MG PO TABS: ORAL_TABLET | ORAL | 0 refills | Status: DC

## 2017-03-01 ENCOUNTER — Ambulatory Visit: Payer: Medicare HMO | Admitting: Nurse Practitioner

## 2017-03-08 ENCOUNTER — Other Ambulatory Visit: Payer: Self-pay | Admitting: Family

## 2017-03-08 ENCOUNTER — Ambulatory Visit (INDEPENDENT_AMBULATORY_CARE_PROVIDER_SITE_OTHER): Payer: Medicare HMO

## 2017-03-08 DIAGNOSIS — E538 Deficiency of other specified B group vitamins: Secondary | ICD-10-CM

## 2017-03-08 DIAGNOSIS — M797 Fibromyalgia: Secondary | ICD-10-CM

## 2017-03-08 DIAGNOSIS — M79602 Pain in left arm: Secondary | ICD-10-CM

## 2017-03-08 MED ORDER — CYANOCOBALAMIN 1000 MCG/ML IJ SOLN
1000.0000 ug | Freq: Once | INTRAMUSCULAR | Status: AC
  Administered 2017-03-08: 1000 ug via INTRAMUSCULAR

## 2017-03-08 NOTE — Progress Notes (Signed)
Injection given.   Stacy J Burns, MD  

## 2017-03-14 ENCOUNTER — Other Ambulatory Visit: Payer: Self-pay

## 2017-03-14 DIAGNOSIS — M797 Fibromyalgia: Secondary | ICD-10-CM

## 2017-03-14 DIAGNOSIS — M79602 Pain in left arm: Secondary | ICD-10-CM

## 2017-03-14 MED ORDER — CYCLOBENZAPRINE HCL 10 MG PO TABS: ORAL_TABLET | ORAL | 0 refills | Status: DC

## 2017-03-28 ENCOUNTER — Ambulatory Visit: Payer: Medicare HMO | Admitting: Nurse Practitioner

## 2017-04-05 ENCOUNTER — Other Ambulatory Visit: Payer: Self-pay | Admitting: Nurse Practitioner

## 2017-04-05 DIAGNOSIS — M79602 Pain in left arm: Secondary | ICD-10-CM

## 2017-04-05 DIAGNOSIS — M797 Fibromyalgia: Secondary | ICD-10-CM

## 2017-04-17 ENCOUNTER — Other Ambulatory Visit: Payer: Self-pay | Admitting: Family

## 2017-06-06 ENCOUNTER — Other Ambulatory Visit: Payer: Self-pay | Admitting: *Deleted

## 2017-06-06 NOTE — Telephone Encounter (Signed)
Rec'd fax for renewal for Lisinopril 40 mg. Per chart med is not on  Med list faxed back declined. Pt need to make appt w/new provider since Marya Amsler is no longer here.Marland KitchenJohny Chess

## 2017-08-08 ENCOUNTER — Ambulatory Visit: Payer: Medicare HMO | Admitting: Internal Medicine

## 2017-12-22 ENCOUNTER — Other Ambulatory Visit: Payer: Self-pay | Admitting: Family

## 2018-01-15 ENCOUNTER — Other Ambulatory Visit: Payer: Self-pay | Admitting: Family

## 2018-01-15 DIAGNOSIS — M79602 Pain in left arm: Secondary | ICD-10-CM

## 2018-01-15 DIAGNOSIS — M797 Fibromyalgia: Secondary | ICD-10-CM

## 2018-03-20 ENCOUNTER — Other Ambulatory Visit: Payer: Self-pay | Admitting: Cardiovascular Disease

## 2018-04-23 ENCOUNTER — Other Ambulatory Visit: Payer: Self-pay | Admitting: Cardiovascular Disease

## 2018-05-24 ENCOUNTER — Other Ambulatory Visit: Payer: Self-pay | Admitting: Cardiovascular Disease

## 2018-07-23 ENCOUNTER — Other Ambulatory Visit: Payer: Self-pay | Admitting: Cardiovascular Disease

## 2018-07-24 NOTE — Telephone Encounter (Signed)
Spoke to patient we received a request to refill lasix.Stated he changed cardiologist to [facility] he will call his pharmacy and let them know.

## 2018-09-18 ENCOUNTER — Other Ambulatory Visit: Payer: Self-pay | Admitting: Neurosurgery

## 2018-09-18 DIAGNOSIS — R1031 Right lower quadrant pain: Secondary | ICD-10-CM

## 2018-09-21 ENCOUNTER — Other Ambulatory Visit: Payer: Self-pay | Admitting: Neurosurgery

## 2018-09-21 DIAGNOSIS — R1031 Right lower quadrant pain: Secondary | ICD-10-CM

## 2018-09-26 ENCOUNTER — Other Ambulatory Visit: Payer: Self-pay

## 2018-09-26 ENCOUNTER — Ambulatory Visit
Admission: RE | Admit: 2018-09-26 | Discharge: 2018-09-26 | Disposition: A | Discharge status: Home / Self Care | Payer: Medicare HMO | Source: Ambulatory Visit | Attending: Neurosurgery | Admitting: Neurosurgery

## 2018-09-26 DIAGNOSIS — R1031 Right lower quadrant pain: Secondary | ICD-10-CM

## 2018-09-26 MED ORDER — IOPAMIDOL (ISOVUE-300) INJECTION 61%
125.0000 mL | Freq: Once | INTRAVENOUS | Status: AC | PRN
  Administered 2018-09-26: 125 mL via INTRAVENOUS

## 2018-11-01 ENCOUNTER — Other Ambulatory Visit: Payer: Self-pay | Admitting: Cardiovascular Disease

## 2018-11-07 ENCOUNTER — Other Ambulatory Visit: Payer: Self-pay | Admitting: Cardiovascular Disease

## 2018-12-20 ENCOUNTER — Other Ambulatory Visit: Payer: Self-pay | Admitting: Cardiovascular Disease

## 2019-01-11 ENCOUNTER — Other Ambulatory Visit: Payer: Self-pay | Admitting: Cardiovascular Disease

## 2019-07-02 ENCOUNTER — Emergency Department (HOSPITAL_BASED_OUTPATIENT_CLINIC_OR_DEPARTMENT_OTHER): Payer: Medicare HMO

## 2019-07-02 ENCOUNTER — Other Ambulatory Visit: Payer: Self-pay

## 2019-07-02 ENCOUNTER — Observation Stay (HOSPITAL_BASED_OUTPATIENT_CLINIC_OR_DEPARTMENT_OTHER)
Admission: EM | Admit: 2019-07-02 | Discharge: 2019-07-03 | Disposition: A | Discharge status: Home / Self Care | Payer: Medicare HMO | Attending: Emergency Medicine | Admitting: Emergency Medicine

## 2019-07-02 ENCOUNTER — Encounter (HOSPITAL_BASED_OUTPATIENT_CLINIC_OR_DEPARTMENT_OTHER): Payer: Self-pay | Admitting: *Deleted

## 2019-07-02 DIAGNOSIS — E669 Obesity, unspecified: Secondary | ICD-10-CM | POA: Diagnosis not present

## 2019-07-02 DIAGNOSIS — Z20822 Contact with and (suspected) exposure to covid-19: Secondary | ICD-10-CM | POA: Diagnosis not present

## 2019-07-02 DIAGNOSIS — R0789 Other chest pain: Secondary | ICD-10-CM | POA: Diagnosis present

## 2019-07-02 DIAGNOSIS — I110 Hypertensive heart disease with heart failure: Secondary | ICD-10-CM | POA: Diagnosis not present

## 2019-07-02 DIAGNOSIS — Z79899 Other long term (current) drug therapy: Secondary | ICD-10-CM | POA: Diagnosis not present

## 2019-07-02 DIAGNOSIS — I252 Old myocardial infarction: Secondary | ICD-10-CM | POA: Insufficient documentation

## 2019-07-02 DIAGNOSIS — R079 Chest pain, unspecified: Secondary | ICD-10-CM | POA: Diagnosis present

## 2019-07-02 DIAGNOSIS — Z7902 Long term (current) use of antithrombotics/antiplatelets: Secondary | ICD-10-CM | POA: Insufficient documentation

## 2019-07-02 DIAGNOSIS — J449 Chronic obstructive pulmonary disease, unspecified: Secondary | ICD-10-CM | POA: Insufficient documentation

## 2019-07-02 DIAGNOSIS — I5032 Chronic diastolic (congestive) heart failure: Secondary | ICD-10-CM | POA: Diagnosis not present

## 2019-07-02 DIAGNOSIS — Z8673 Personal history of transient ischemic attack (TIA), and cerebral infarction without residual deficits: Secondary | ICD-10-CM | POA: Diagnosis not present

## 2019-07-02 DIAGNOSIS — F0390 Unspecified dementia without behavioral disturbance: Secondary | ICD-10-CM | POA: Diagnosis not present

## 2019-07-02 DIAGNOSIS — I200 Unstable angina: Secondary | ICD-10-CM | POA: Diagnosis present

## 2019-07-02 DIAGNOSIS — Z6837 Body mass index (BMI) 37.0-37.9, adult: Secondary | ICD-10-CM | POA: Diagnosis not present

## 2019-07-02 DIAGNOSIS — E119 Type 2 diabetes mellitus without complications: Secondary | ICD-10-CM | POA: Insufficient documentation

## 2019-07-02 DIAGNOSIS — Z794 Long term (current) use of insulin: Secondary | ICD-10-CM | POA: Diagnosis not present

## 2019-07-02 DIAGNOSIS — Z87891 Personal history of nicotine dependence: Secondary | ICD-10-CM | POA: Diagnosis not present

## 2019-07-02 DIAGNOSIS — R0602 Shortness of breath: Secondary | ICD-10-CM | POA: Diagnosis not present

## 2019-07-02 DIAGNOSIS — I25110 Atherosclerotic heart disease of native coronary artery with unstable angina pectoris: Principal | ICD-10-CM | POA: Insufficient documentation

## 2019-07-02 LAB — COMPREHENSIVE METABOLIC PANEL
ALT: 16 U/L (ref 0–44)
AST: 17 U/L (ref 15–41)
Albumin: 4.4 g/dL (ref 3.5–5.0)
Alkaline Phosphatase: 29 U/L — ABNORMAL LOW (ref 38–126)
Anion gap: 7 (ref 5–15)
BUN: 16 mg/dL (ref 6–20)
CO2: 28 mmol/L (ref 22–32)
Calcium: 9.2 mg/dL (ref 8.9–10.3)
Chloride: 106 mmol/L (ref 98–111)
Creatinine, Ser: 0.64 mg/dL (ref 0.61–1.24)
GFR calc Af Amer: >60 mL/min (ref >60)
GFR calc non Af Amer: >60 mL/min (ref >60)
Glucose, Bld: 92 mg/dL (ref 70–99)
Potassium: 3.2 mmol/L — ABNORMAL LOW (ref 3.5–5.1)
Sodium: 141 mmol/L (ref 135–145)
Total Bilirubin: 0.4 mg/dL (ref 0.3–1.2)
Total Protein: 7 g/dL (ref 6.5–8.1)

## 2019-07-02 LAB — RESPIRATORY PANEL BY RT PCR (FLU A&B, COVID)
Influenza A by PCR: NEGATIVE
Influenza B by PCR: NEGATIVE
SARS Coronavirus 2 by RT PCR: NEGATIVE

## 2019-07-02 LAB — CBC WITH DIFFERENTIAL/PLATELET
Abs Immature Granulocytes: 0.02 10*3/uL (ref 0.00–0.07)
Basophils Absolute: 0.1 10*3/uL (ref 0.0–0.1)
Basophils Relative: 1 %
Eosinophils Absolute: 0.1 10*3/uL (ref 0.0–0.5)
Eosinophils Relative: 2 %
HCT: 43.1 % (ref 39.0–52.0)
Hemoglobin: 14.6 g/dL (ref 13.0–17.0)
Immature Granulocytes: 0 %
Lymphocytes Relative: 38 %
Lymphs Abs: 2.3 10*3/uL (ref 0.7–4.0)
MCH: 30.2 pg (ref 26.0–34.0)
MCHC: 33.9 g/dL (ref 30.0–36.0)
MCV: 89.2 fL (ref 80.0–100.0)
Monocytes Absolute: 0.5 10*3/uL (ref 0.1–1.0)
Monocytes Relative: 8 %
Neutro Abs: 3.2 10*3/uL (ref 1.7–7.7)
Neutrophils Relative %: 51 %
Platelets: 160 10*3/uL (ref 150–400)
RBC: 4.83 MIL/uL (ref 4.22–5.81)
RDW: 13.4 % (ref 11.5–15.5)
WBC: 6.1 10*3/uL (ref 4.0–10.5)
nRBC: 0 % (ref 0.0–0.2)

## 2019-07-02 LAB — URINALYSIS, ROUTINE W REFLEX MICROSCOPIC
Bilirubin Urine: NEGATIVE
Glucose, UA: >=500 mg/dL — AB
Hgb urine dipstick: NEGATIVE
Ketones, ur: NEGATIVE mg/dL
Leukocytes,Ua: NEGATIVE
Nitrite: NEGATIVE
Protein, ur: NEGATIVE mg/dL
Specific Gravity, Urine: 1.015 (ref 1.005–1.030)
pH: 6.5 (ref 5.0–8.0)

## 2019-07-02 LAB — TROPONIN I (HIGH SENSITIVITY)
Troponin I (High Sensitivity): 3 ng/L (ref <18)
Troponin I (High Sensitivity): 3 ng/L (ref <18)

## 2019-07-02 LAB — D-DIMER, QUANTITATIVE: D-Dimer, Quant: <0.27 ug/mL-FEU (ref 0.00–0.50)

## 2019-07-02 LAB — CBG MONITORING, ED: Glucose-Capillary: 85 mg/dL (ref 70–99)

## 2019-07-02 LAB — URINALYSIS, MICROSCOPIC (REFLEX)

## 2019-07-02 LAB — LIPASE, BLOOD: Lipase: 25 U/L (ref 11–51)

## 2019-07-02 LAB — BRAIN NATRIURETIC PEPTIDE: B Natriuretic Peptide: 35.6 pg/mL (ref 0.0–100.0)

## 2019-07-02 LAB — SARS CORONAVIRUS 2 AG (30 MIN TAT): SARS Coronavirus 2 Ag: NEGATIVE

## 2019-07-02 LAB — GLUCOSE, CAPILLARY: Glucose-Capillary: 83 mg/dL (ref 70–99)

## 2019-07-02 MED ORDER — BISOPROLOL FUMARATE 5 MG PO TABS
5.0000 mg | ORAL_TABLET | Freq: Two times a day (BID) | ORAL | Status: DC
  Administered 2019-07-03 (×2): 5 mg via ORAL
  Filled 2019-07-02 (×2): qty 1

## 2019-07-02 MED ORDER — NITROGLYCERIN 0.4 MG SL SUBL: 0.4000 mg | SUBLINGUAL_TABLET | SUBLINGUAL | Status: DC | PRN

## 2019-07-02 MED ORDER — CITALOPRAM HYDROBROMIDE 20 MG PO TABS
40.0000 mg | ORAL_TABLET | Freq: Every day | ORAL | Status: DC
  Administered 2019-07-03: 40 mg via ORAL
  Filled 2019-07-02: qty 2

## 2019-07-02 MED ORDER — DOCUSATE SODIUM 100 MG PO CAPS: 100.0000 mg | ORAL_CAPSULE | Freq: Every day | ORAL | Status: DC | PRN

## 2019-07-02 MED ORDER — IOHEXOL 350 MG/ML SOLN
100.0000 mL | Freq: Once | INTRAVENOUS | Status: AC | PRN
  Administered 2019-07-02: 17:00:00 100 mL via INTRAVENOUS

## 2019-07-02 MED ORDER — PANTOPRAZOLE SODIUM 40 MG PO TBEC
40.0000 mg | DELAYED_RELEASE_TABLET | Freq: Every day | ORAL | Status: DC
  Administered 2019-07-03: 09:00:00 40 mg via ORAL
  Filled 2019-07-02: qty 1

## 2019-07-02 MED ORDER — FINASTERIDE 5 MG PO TABS: 5.0000 mg | ORAL_TABLET | Freq: Every day | ORAL | Status: DC

## 2019-07-02 MED ORDER — ALBUTEROL SULFATE HFA 108 (90 BASE) MCG/ACT IN AERS: 2.0000 | INHALATION_SPRAY | Freq: Four times a day (QID) | RESPIRATORY_TRACT | Status: DC | PRN

## 2019-07-02 MED ORDER — ALBUTEROL SULFATE (2.5 MG/3ML) 0.083% IN NEBU: 1.2500 mg | INHALATION_SOLUTION | Freq: Four times a day (QID) | RESPIRATORY_TRACT | Status: DC | PRN

## 2019-07-02 MED ORDER — PROMETHAZINE HCL 25 MG PO TABS: 25.0000 mg | ORAL_TABLET | Freq: Four times a day (QID) | ORAL | Status: DC | PRN

## 2019-07-02 MED ORDER — TRAMADOL HCL 50 MG PO TABS
50.0000 mg | ORAL_TABLET | Freq: Four times a day (QID) | ORAL | Status: DC | PRN
  Administered 2019-07-03: 50 mg via ORAL
  Filled 2019-07-02: qty 1

## 2019-07-02 MED ORDER — CYCLOBENZAPRINE HCL 10 MG PO TABS
10.0000 mg | ORAL_TABLET | Freq: Three times a day (TID) | ORAL | Status: DC | PRN
  Administered 2019-07-03: 14:00:00 10 mg via ORAL
  Filled 2019-07-02: qty 1

## 2019-07-02 MED ORDER — GABAPENTIN 400 MG PO CAPS
800.0000 mg | ORAL_CAPSULE | Freq: Three times a day (TID) | ORAL | Status: AC
  Administered 2019-07-03: 01:00:00 800 mg via ORAL
  Filled 2019-07-02: qty 2

## 2019-07-02 MED ORDER — ASPIRIN 300 MG RE SUPP: 300.0000 mg | RECTAL | Status: AC

## 2019-07-02 MED ORDER — ONDANSETRON HCL 4 MG/2ML IJ SOLN: 4.0000 mg | Freq: Four times a day (QID) | INTRAMUSCULAR | Status: DC | PRN

## 2019-07-02 MED ORDER — INSULIN DETEMIR 100 UNIT/ML ~~LOC~~ SOLN: 132.0000 [IU] | Freq: Every day | SUBCUTANEOUS | Status: DC

## 2019-07-02 MED ORDER — POTASSIUM GLUCONATE 595 MG PO CAPS: 2.0000 | ORAL_CAPSULE | Freq: Every day | ORAL | Status: DC

## 2019-07-02 MED ORDER — UMECLIDINIUM-VILANTEROL 62.5-25 MCG/INH IN AEPB
1.0000 | INHALATION_SPRAY | Freq: Every day | RESPIRATORY_TRACT | Status: DC
  Filled 2019-07-02: qty 14

## 2019-07-02 MED ORDER — ASPIRIN 81 MG PO CHEW
324.0000 mg | CHEWABLE_TABLET | Freq: Once | ORAL | Status: AC
  Administered 2019-07-02: 18:00:00 324 mg via ORAL
  Filled 2019-07-02: qty 4

## 2019-07-02 MED ORDER — NITROGLYCERIN 0.4 MG SL SUBL
0.4000 mg | SUBLINGUAL_TABLET | SUBLINGUAL | Status: DC | PRN
  Administered 2019-07-02 (×2): 0.4 mg via SUBLINGUAL
  Filled 2019-07-02: qty 1

## 2019-07-02 MED ORDER — SUCRALFATE 1 G PO TABS: 1.0000 g | ORAL_TABLET | Freq: Three times a day (TID) | ORAL | Status: DC

## 2019-07-02 MED ORDER — MEMANTINE HCL 5 MG PO TABS
10.0000 mg | ORAL_TABLET | Freq: Two times a day (BID) | ORAL | Status: DC
  Administered 2019-07-03 (×2): 10 mg via ORAL
  Filled 2019-07-02 (×2): qty 2

## 2019-07-02 MED ORDER — POTASSIUM CHLORIDE CRYS ER 20 MEQ PO TBCR
40.0000 meq | EXTENDED_RELEASE_TABLET | Freq: Once | ORAL | Status: AC
  Administered 2019-07-02: 18:00:00 40 meq via ORAL
  Filled 2019-07-02: qty 2

## 2019-07-02 MED ORDER — GLIMEPIRIDE 4 MG PO TABS
4.0000 mg | ORAL_TABLET | Freq: Two times a day (BID) | ORAL | Status: DC
  Administered 2019-07-03: 14:00:00 4 mg via ORAL
  Filled 2019-07-02 (×2): qty 1
  Filled 2019-07-02: qty 4
  Filled 2019-07-02: qty 1

## 2019-07-02 MED ORDER — FLUTICASONE PROPIONATE 50 MCG/ACT NA SUSP
1.0000 | Freq: Two times a day (BID) | NASAL | Status: DC
  Filled 2019-07-02: qty 16

## 2019-07-02 MED ORDER — FUROSEMIDE 40 MG PO TABS
40.0000 mg | ORAL_TABLET | Freq: Two times a day (BID) | ORAL | Status: AC
  Administered 2019-07-03 (×2): 40 mg via ORAL
  Filled 2019-07-02 (×2): qty 1

## 2019-07-02 MED ORDER — CLOPIDOGREL BISULFATE 75 MG PO TABS
75.0000 mg | ORAL_TABLET | Freq: Every day | ORAL | Status: DC
  Administered 2019-07-03: 75 mg via ORAL
  Filled 2019-07-02: qty 1

## 2019-07-02 MED ORDER — PREGABALIN 75 MG PO CAPS: 75.0000 mg | ORAL_CAPSULE | Freq: Two times a day (BID) | ORAL | Status: DC

## 2019-07-02 MED ORDER — LIRAGLUTIDE 18 MG/3ML ~~LOC~~ SOPN: 1.8000 mg | PEN_INJECTOR | Freq: Every day | SUBCUTANEOUS | Status: DC

## 2019-07-02 MED ORDER — AMITRIPTYLINE HCL 25 MG PO TABS
50.0000 mg | ORAL_TABLET | Freq: Every day | ORAL | Status: AC
  Administered 2019-07-03: 01:00:00 50 mg via ORAL
  Filled 2019-07-02: qty 2

## 2019-07-02 MED ORDER — ASPIRIN 81 MG PO CHEW
324.0000 mg | CHEWABLE_TABLET | ORAL | Status: AC
  Administered 2019-07-03: 01:00:00 324 mg via ORAL
  Filled 2019-07-02: qty 4

## 2019-07-02 MED ORDER — ASPIRIN EC 81 MG PO TBEC
81.0000 mg | DELAYED_RELEASE_TABLET | Freq: Every day | ORAL | Status: DC
  Administered 2019-07-03: 81 mg via ORAL
  Filled 2019-07-02: qty 1

## 2019-07-02 MED ORDER — MORPHINE SULFATE (PF) 4 MG/ML IV SOLN
4.0000 mg | Freq: Once | INTRAVENOUS | Status: AC
  Administered 2019-07-02: 17:00:00 4 mg via INTRAVENOUS
  Filled 2019-07-02: qty 1

## 2019-07-02 MED ORDER — LORAZEPAM 1 MG PO TABS: 1.0000 mg | ORAL_TABLET | Freq: Four times a day (QID) | ORAL | Status: DC | PRN

## 2019-07-02 MED ORDER — ACETAMINOPHEN 325 MG PO TABS: 650.0000 mg | ORAL_TABLET | ORAL | Status: DC | PRN

## 2019-07-02 MED ORDER — INSULIN ASPART 100 UNIT/ML ~~LOC~~ SOLN
0.0000 [IU] | Freq: Three times a day (TID) | SUBCUTANEOUS | Status: DC
  Administered 2019-07-03: 5 [IU] via SUBCUTANEOUS

## 2019-07-02 MED ORDER — ONDANSETRON 4 MG PO TBDP
4.0000 mg | ORAL_TABLET | Freq: Three times a day (TID) | ORAL | Status: DC | PRN
  Filled 2019-07-02: qty 1

## 2019-07-02 MED ORDER — FENOFIBRATE 160 MG PO TABS
160.0000 mg | ORAL_TABLET | Freq: Every day | ORAL | Status: DC
  Administered 2019-07-03: 160 mg via ORAL
  Filled 2019-07-02: qty 1

## 2019-07-02 MED ORDER — IRBESARTAN 150 MG PO TABS: 300.0000 mg | ORAL_TABLET | Freq: Every day | ORAL | Status: DC

## 2019-07-02 NOTE — ED Notes (Signed)
Transported to CT 

## 2019-07-02 NOTE — H&P (Signed)
Cardiology Admission History and Physical:   Patient ID: Bryan Becker MRN: [id number]; DOB: 13-Apr-1961   Admission date: 07/02/2019  Primary Care Provider: Robert Bellow, PA-C Primary Cardiologist:  Croitoru Primary Electrophysiologist:  None   Chief Complaint:  CP  Patient Profile:   Bryan Becker is a 59 y.o. male with no previous cardiac hx, but w/ h/o morbid obesity, DM2, h/o CVA, COPD, OSA, cirrhosis due to NASH presented to [facility] ED earlier today c/o CP.  History of Present Illness:   Bryan Becker has the above history; he presented to the ED at [place] earlier this afternoon c/o constant CP and generalized fatigue x 2-3 days. He describes his discomfort as a "tightness" extending across his entire chest, constant, onset at rest, with associated SOB. No associated nausea, diaphoresis, palpitations or any other associated sx. Nothing in particular makes his chest discomfort better or worse.  He also c/o abdominal distension and 10-lb weight gain over the past 2-3 weeks.  Workup in the [place] ED has been unremarkable; there are no acute EKG changes, and HS trop has been negative x 2 (3--->3).  Pt had continued CP in the [facility] ED and was noted to be diaphoretic with that episode and uncomfortable, so decision was made to transfer to [facility] for further w/u.  Heart Pathway Score:  HEAR Score: 6  Past Medical History:  Diagnosis Date  . Anxiety   . Arthritis    "joints"  . Asthma   . CHF (congestive heart failure) ([place])   . Chronic back pain    "mostly lower; have 2 bulging discs in my neck" (12/05/2013)  . Chronic bronchitis ([place])    "get it q yr"  . Cirrhosis ([place])    pt reports nonalcoholic cirrhosis "NASH"  . Confusion   . COPD (chronic obstructive pulmonary disease) ([place])   . Dementia ([place])    onset of early  . Dementia ([place])   . Depression   . Fall at home 06/04/14   Pt stated he falls off the bed at nightly  . Family history of adverse reaction to  anesthesia    Patients mother had a bad reaction to Anesthesia however pt is unaware of reaction  . Fibromyalgia   . Frequent urination   . Gastric ulcer   . GERD (gastroesophageal reflux disease)   . OEVOJJKK(938.1)    "weekly" (12/04/2013)  . Hyperlipidemia   . Hypertension   . Kidney stone   . Myocardial infarction [facility])    unsure about MI history, but had normal cath that admission '07 ([facility])  . Nausea & vomiting   . OSA (obstructive sleep apnea) 04/24/2013   "use BiPAP"  . Pancreatitis   . Pneumonia    "several times"  . [facility] spotted fever) 11/22/2012  . Seasonal allergies   . Shortness of breath dyspnea    with ambulation  . Stroke [facility]) 03/2013   "still weak on the left side" (12/05/2013)  . Stroke [facility]) 12/04/2013   "came in today w/right sided weakness" (12/04/2013)  . Type II diabetes mellitus ([place])     Past Surgical History:  Procedure Laterality Date  . CARDIAC CATHETERIZATION     10/28/05: right dominant, normal LV systolic function, normal coronaries. (Dr. Baxter Hire, [facility])  . CHOLECYSTECTOMY    . COLONOSCOPY    . COLONOSCOPY WITH PROPOFOL N/A 07/08/2016   Procedure: COLONOSCOPY WITH PROPOFOL;  Surgeon: Gatha Mayer, MD;  Location: [facility] ENDOSCOPY;  Service: Endoscopy;  Laterality: N/A;  . CYSTOSCOPY W/ STONE MANIPULATION    . LUMBAR LAMINECTOMY/DECOMPRESSION MICRODISCECTOMY Left 06/18/2014   Procedure: LUMBAR LAMINECTOMY/DECOMPRESSION MICRODISCECTOMY LEFT LUMBAR FIVE-SACRAL ONE;  Surgeon: Ashok Pall, MD;  Location: [place] NEURO ORS;  Service: Neurosurgery;  Laterality: Left;  left  . multiple cysts removal-hip,wrist    . TONSILLECTOMY  ~ 1985  . UPPER GASTROINTESTINAL ENDOSCOPY       Medications Prior to Admission: Prior to Admission medications   Medication Sig Start Date End Date Taking? Authorizing Provider  ACCU-CHEK SOFTCLIX LANCETS lancets USE TO CHECK BLOOD-SUGAR UP TO 4 TIMES DAILY (DX CODE E11.65) 10/27/16   Golden Circle, FNP  albuterol  (ACCUNEB) 1.25 MG/3ML nebulizer solution Take 1 ampule by nebulization every 6 (six) hours as needed for wheezing.    [provider]  ANORO ELLIPTA 62.5-25 MCG/INH AEPB INHALE 1 PUFF INTO THE LUNGS DAILY. 02/09/17   Tanda Rockers, MD  bisoprolol (ZEBETA) 5 MG tablet TAKE 1 TABLET TWICE DAILY 02/09/17   Tanda Rockers, MD  blood glucose meter kit and supplies KIT Dispense based on patient and insurance preference. Use up to four times daily as directed. Dx Code: E11.65 09/19/16   Golden Circle, FNP  citalopram (CELEXA) 40 MG tablet TAKE 1 TABLET (40 MG TOTAL) DAILY. 01/31/17   Golden Circle, FNP  clopidogrel (PLAVIX) 75 MG tablet TAKE 1 TABLET EVERY DAY 02/09/17   Golden Circle, FNP  cyclobenzaprine (FLEXERIL) 10 MG tablet TAKE 1 TABLET THREE TIMES DAILY AS NEEDED FOR MUSCLE SPASMS 03/14/17   Lance Sell, NP  docusate sodium (COLACE) 100 MG capsule Take 100 mg by mouth daily as needed for mild constipation.    [provider]  fenofibrate 160 MG tablet Take 160 mg by mouth daily. 06/22/16   [provider]  finasteride (PROSCAR) 5 MG tablet Take 5 mg by mouth daily.    [provider]  fluticasone (FLONASE) 50 MCG/ACT nasal spray USE 2 SPRAYS IN EACH NOSTRIL ONE TIME DAILY AS NEEDED FOR ALLERGIES 12/09/16   Tanda Rockers, MD  furosemide (LASIX) 40 MG tablet TAKE 1/2 TABLET TWICE DAILY (MUST MAKE APPOINTMENT) 01/15/19   Croitoru, Dani Gobble, MD  gabapentin (NEURONTIN) 600 MG tablet Take 2 tablets (1,200 mg total) by mouth 3 (three) times daily. 09/14/16   Golden Circle, FNP  glimepiride (AMARYL) 2 MG tablet TAKE 1 TABLET (2 MG TOTAL) DAILY. 02/09/17   Golden Circle, FNP  glucose blood (ACCU-CHEK AVIVA PLUS) test strip USE TO CHECK BLOOD-SUGAR UP TO 4 TIMES DAILY (DX CODE E11.65) 02/15/17   Golden Circle, FNP  insulin detemir (LEVEMIR) 100 UNIT/ML injection Inject 0.6 mLs (60 Units total) into the skin at bedtime. Patient taking differently:  Inject 132 Units into the skin at bedtime.  09/09/16   Golden Circle, FNP  irbesartan (AVAPRO) 300 MG tablet TAKE 1 TABLET EVERY DAY 12/09/16   Tanda Rockers, MD  L-Lysine 1000 MG TABS Take 1,000 mg by mouth daily as needed (for fever blisters).    [provider]  LEVEMIR FLEXTOUCH 100 UNIT/ML Pen INJECT  60 UNITS SUBCUTANEOUSLY AT BEDTIME 02/09/17   Golden Circle, FNP  liraglutide 18 MG/3ML SOPN Inject 0.3 mLs (1.8 mg total) into the skin daily. 08/15/16   Golden Circle, FNP  LORazepam (ATIVAN) 1 MG tablet TAKE 1 TABLET EVERY 8 HOURS AS NEEDED FOR ANXIETY 02/01/17   Golden Circle, FNP  LYRICA 75 MG  capsule TAKE 1 CAPSULE TWICE DAILY 02/21/17   Calone, Gregory D, FNP  memantine (NAMENDA) 10 MG tablet TAKE 1 TABLET TWICE DAILY 01/31/17   Calone, Gregory D, FNP  NOVOLOG FLEXPEN 100 UNIT/ML FlexPen INJECT 20 TO 30 UNITS SUBCUTANEOUSLY FOUR TIMES DAILY 12/09/16   Calone, Gregory D, FNP  ondansetron (ZOFRAN ODT) 4 MG disintegrating tablet Take 1 tablet (4 mg total) by mouth every 8 (eight) hours as needed for nausea or vomiting. 11/22/16   Calone, Gregory D, FNP  OXYGEN 2lpm with sleep and occ as needed during the day [employer]    [provider]  pantoprazole (PROTONIX) 40 MG tablet TAKE 1 TABLET EVERY DAY 01/31/17   Calone, Gregory D, FNP  Potassium Gluconate 595 MG CAPS Take 2 capsules by mouth daily.    [provider]  promethazine (PHENERGAN) 25 MG tablet Take 1 tablet (25 mg total) by mouth every 6 (six) hours as needed for nausea or vomiting. 02/11/14   Ward, Kristen N, DO  Specialty Vitamins Products (MAGNESIUM, AMINO ACID CHELATE,) 133 MG tablet Take 1 tablet by mouth 2 (two) times daily.    [provider]  sucralfate (CARAFATE) 1 g tablet Take 1 g by mouth 4 (four) times daily -  with meals and at bedtime.    [provider]  traMADol (ULTRAM) 50 MG tablet TAKE 1 TO 2 TABLETS BY MOUTH EVERY 8 HOURS AS NEEDED FOR MODERATE PAIN 02/21/17   Calone,  Gregory D, FNP  VENTOLIN HFA 108 (90 Base) MCG/ACT inhaler INHALE 2 PUFFS EVERY 6 HOURS AS NEEDED 12/16/16   Parrett, Tammy S, NP     Allergies:    Allergies  Allergen Reactions  . Actos [Pioglitazone] Shortness Of Breath    Caused patient to be in CHF  . Aricept [Donepezil Hcl] Diarrhea    Social History:   Social History   Socioeconomic History  . Marital status: Married    Spouse name: Not on file  . Number of children: 2  . Years of education: 16  . Highest education level: Not on file  Occupational History  . Occupation: Disability    Employer: [employer]  Tobacco Use  . Smoking status: Former Smoker    Packs/day: 2.00    Years: 21.00    Pack years: 42.00    Types: Cigarettes    Quit date: 05/23/2002    Years since quitting: 17.1  . Smokeless tobacco: Never Used  Substance and Sexual Activity  . Alcohol use: No  . Drug use: No  . Sexual activity: Not Currently  Other Topics Concern  . Not on file  Social History Narrative  . Not on file   Social Determinants of Health   Financial Resource Strain:   . Difficulty of Paying Living Expenses: Not on file  Food Insecurity:   . Worried About Running Out of Food in the Last Year: Not on file  . Ran Out of Food in the Last Year: Not on file  Transportation Needs:   . Lack of Transportation (Medical): Not on file  . Lack of Transportation (Non-Medical): Not on file  Physical Activity:   . Days of Exercise per Week: Not on file  . Minutes of Exercise per Session: Not on file  Stress:   . Feeling of Stress : Not on file  Social Connections:   . Frequency of Communication with Friends and Family: Not on file  . Frequency of Social Gatherings with Friends and Family: Not on   file  . Attends Religious Services: Not on file  . Active Member of Clubs or Organizations: Not on file  . Attends Club or Organization Meetings: Not on file  . Marital Status: Not on file  Intimate Partner Violence:   . Fear of  Current or Ex-Partner: Not on file  . Emotionally Abused: Not on file  . Physically Abused: Not on file  . Sexually Abused: Not on file    Family History:   The patient's family history includes Aneurysm in his father, maternal uncle, and maternal uncle; Arthritis in his mother; Bone cancer in his paternal grandmother; Diabetes in his maternal grandmother and mother; Heart attack in his father and paternal grandmother; Heart disease in his maternal grandmother and paternal grandfather; Hypertension in his father and mother; Lung cancer in his paternal grandfather; Other in his mother; Skin cancer in his father; Stomach cancer in his father and maternal grandfather. There is no history of Colon cancer, Esophageal cancer, or Rectal cancer.    ROS:  Please see the history of present illness.  All other ROS reviewed and negative.     Physical Exam/Data:   Vitals:   07/02/19 1845 07/02/19 1945 07/02/19 2000 07/02/19 2030  BP: (!) 129/59 120/71 122/67 138/71  Pulse: 80 77 73 85  Resp: 15 (!) 21 18 18  Temp:      TempSrc:      SpO2: 95% 96% 95% 95%  Weight:      Height:        Intake/Output Summary (Last 24 hours) at 07/02/2019 2234 Last data filed at 07/02/2019 1530 Gross per 24 hour  Intake --  Output 510 ml  Net -510 ml   Last 3 Weights 07/02/2019 02/01/2017 11/22/2016  Weight (lbs) 255 lb 288 lb 9.6 oz 289 lb  Weight (kg) 115.667 kg 130.908 kg 131.09 kg     Body mass index is 37.66 kg/m.  General:  Well nourished, well developed, in no acute distress HEENT: normal Lymph: no adenopathy Neck: no JVD Endocrine:  No thryomegaly Vascular: No carotid bruits; DP pulses 2+ bilaterally  Cardiac:  normal S1, S2; RRR; no murmur  Lungs:  clear to auscultation bilaterally, no wheezing, rhonchi or rales  Abd: soft, nontender, no hepatomegaly  Ext: no edema Musculoskeletal:  No deformities, BUE and BLE strength normal and equal Skin: warm and dry  Neuro:  CNs 2-12 intact, no focal  abnormalities noted Psych:  Normal affect    EKG:  The ECG that was done 07-02-19 was personally reviewed and demonstrates NSR with nonspecific ST changes  Relevant CV Studies: 03-16-16 Nuc stress test  The left ventricular ejection fraction is normal (55-65%).  Nuclear stress EF: 58%.  There was no ST segment deviation noted during stress.  The study is normal.  Normal stress nuclear study with no ischemia or infarction; EF 58 with normal wall motion.   TTE 04-13-16 Left ventricle: The cavity size was normal. Wall thickness was  increased in a pattern of mild LVH. Systolic function was normal.  The estimated ejection fraction was in the range of 60% to 65%.  Wall motion was normal; there were no regional wall motion  abnormalities.  Normal atria, normal RV No hemodynamically significant valvular pathology noted - Aorta: Ascending aortic diameter: 38 mm (S).  - Ascending aorta: The ascending aorta was mildly dilated.   Laboratory Data:  High Sensitivity Troponin:   Recent Labs  Lab 07/02/19 1457 07/02/19 1608  TROPONINIHS 3 3        Chemistry Recent Labs  Lab 07/02/19 1527  NA 141  K 3.2*  CL 106  CO2 28  GLUCOSE 92  BUN 16  CREATININE 0.64  CALCIUM 9.2  GFRNONAA >60  GFRAA >60  ANIONGAP 7    Recent Labs  Lab 07/02/19 1527  PROT 7.0  ALBUMIN 4.4  AST 17  ALT 16  ALKPHOS 29*  BILITOT 0.4   Hematology Recent Labs  Lab 07/02/19 1527  WBC 6.1  RBC 4.83  HGB 14.6  HCT 43.1  MCV 89.2  MCH 30.2  MCHC 33.9  RDW 13.4  PLT 160   BNP Recent Labs  Lab 07/02/19 1457  BNP 35.6    DDimer  Recent Labs  Lab 07/02/19 1600  DDIMER <0.27     Radiology/Studies:  DG Chest Port 1 View  Result Date: 07/02/2019 CLINICAL DATA:  Short of breath, weakness, fatigue EXAM: PORTABLE CHEST 1 VIEW COMPARISON:  11/27/2016 FINDINGS: The heart size and mediastinal contours are within normal limits. Both lungs are clear. The visualized skeletal structures  are unremarkable. IMPRESSION: No active disease. Electronically Signed   By: Michael  Brown M.D.   On: 07/02/2019 15:00   CT Angio Chest/Abd/Pel for Dissection W and/or W/WO  Result Date: 07/02/2019 CLINICAL DATA:  Shortness of breath, weakness, fatigue.  Chest pain EXAM: CT ANGIOGRAPHY CHEST, ABDOMEN AND PELVIS TECHNIQUE: Multidetector CT imaging through the chest, abdomen and pelvis was performed using the standard protocol during bolus administration of intravenous contrast. Multiplanar reconstructed images and MIPs were obtained and reviewed to evaluate the vascular anatomy. CONTRAST:  100mL OMNIPAQUE IOHEXOL 350 MG/ML SOLN COMPARISON:  09/26/2018 FINDINGS: CTA CHEST FINDINGS Cardiovascular: Heart is normal size. Aorta normal caliber. No dissection. No filling defects in the pulmonary arteries to suggest pulmonary emboli. Mediastinum/Nodes: No mediastinal, hilar, or axillary adenopathy. Trachea and esophagus are unremarkable. Thyroid unremarkable. Lungs/Pleura: Lungs are clear. No focal airspace opacities or suspicious nodules. No effusions. Musculoskeletal: No acute bony abnormality. Chest wall soft tissues unremarkable. Review of the MIP images confirms the above findings. CTA ABDOMEN AND PELVIS FINDINGS VASCULAR Aorta: Normal caliber aorta without aneurysm, dissection, vasculitis or significant stenosis. Scattered calcifications in the infrarenal aorta. Celiac: Patent without evidence of aneurysm, dissection, vasculitis or significant stenosis. SMA: Patent without evidence of aneurysm, dissection, vasculitis or significant stenosis. Renals: Both renal arteries are patent without evidence of aneurysm, dissection, vasculitis, fibromuscular dysplasia or significant stenosis. IMA: Patent without evidence of aneurysm, dissection, vasculitis or significant stenosis. Inflow: Patent without evidence of aneurysm, dissection, vasculitis or significant stenosis. Veins: No obvious venous abnormality within the  limitations of this arterial phase study. Review of the MIP images confirms the above findings. NON-VASCULAR Hepatobiliary: Prior cholecystectomy.  No focal hepatic abnormality. Pancreas: No focal abnormality or ductal dilatation. Spleen: No focal abnormality.  Normal size. Adrenals/Urinary Tract: No adrenal abnormality. No focal renal abnormality. No stones or hydronephrosis. Urinary bladder is unremarkable. Stomach/Bowel: Normal appendix. Stomach, large and small bowel grossly unremarkable. Lymphatic: No adenopathy Reproductive: Prostate enlargement. Brachy therapy seeds in the region of the prostate. Other: No free fluid or free air. Musculoskeletal: No acute bony abnormality. Review of the MIP images confirms the above findings. IMPRESSION: No evidence of aortic aneurysm or dissection. Atherosclerotic calcifications in the infrarenal aorta. No acute cardiopulmonary disease. No acute findings in the abdomen or pelvis. Electronically Signed   By: Kevin  Dover M.D.   On: 07/02/2019 17:32       HEAR Score (for undifferentiated chest pain):  HEAR Score: 6      Assessment and Plan:   1. CP: atypical, unclear etiology. Aortic pathology ruled out by CTA chest/abd/pelvis at OSH. Pt has no prior h/o CAD. HS trop neg x 2 sets, EKG unremarkable. Plan communicated to me by outgoing cardiology team is that the pt will undergo non-invasive evaluation tomorrow, hopefully coronary CTA. NPO after MN tonight. 2. HTN/DM2: restart home regimen and adjust PRN 3. Cirrhosis due to NASH: no acute abd pathology noted on CT done at OSH   For questions or updates, please contact [facility] Please consult [email or web address] for contact info under        Signed,  [place] , MD  07/02/2019 10:34 PM   

## 2019-07-02 NOTE — ED Notes (Signed)
Pt reports left sided chest tightness increased since first arrived.  EKG/vs repeated. Dr Laverta Baltimore and mid level provider made aware and will see pt.ED Provider at bedside.

## 2019-07-02 NOTE — ED Notes (Signed)
Transferred to [employer] via [employer] 

## 2019-07-02 NOTE — ED Provider Notes (Signed)
[facility] EMERGENCY DEPARTMENT Provider Note   CSN: [id number] Arrival date & time: 07/02/19  1335     History Chief Complaint  Patient presents with  . Shortness of Breath  . Weakness    Bryan Becker is a 59 y.o. male with PMHx HTN, HLD, diastolic CHF with EF 94-76% (2017), COPD, diabetes, CAD s/p cath 2007, cirrhosis s/2 NASH, who presents to the ED today gradual onset, constant, generalized fatigue x 2-3 days. Pt also endorses shortness of breath, chest tightness, and abdominal distension with a 10 pound weight gain in the past 2-3 weeks.  Endorses similar symptoms happened near Christmas time but he did not come to the ED to be evaluated and he states that the symptoms resolved in a couple of days.  He does endorse that he checked his blood pressure yesterday while at his mother's house and it was 60/40.  He states that when he went home it was 150/112.  He states that he also checked his oxygen saturation and with ambulation it decreased to 88%.  Patient does wear 2 L of oxygen at nighttime.  He is denying any recent COVID-19 positive exposure.  He called his PCPs office today and was told to come to the emergency department for further evaluation.  She denies fever, chills, sore throat, cough, abdominal pain, nausea, vomiting, diarrhea, body aches, any other associated symptoms.   The history is provided by the patient and medical records.      Past Medical History:  Diagnosis Date  . Anxiety   . Arthritis    "joints"  . Asthma   . CHF (congestive heart failure) ([place])   . Chronic back pain    "mostly lower; have 2 bulging discs in my neck" (12/05/2013)  . Chronic bronchitis ([place])    "get it q yr"  . Cirrhosis ([place])    pt reports nonalcoholic cirrhosis "NASH"  . Confusion   . COPD (chronic obstructive pulmonary disease) ([place])   . Dementia ([place])    onset of early  . Dementia ([place])   . Depression   . Fall at home 06/04/14   Pt stated he falls off the bed at  nightly  . Family history of adverse reaction to anesthesia    Patients mother had a bad reaction to Anesthesia however pt is unaware of reaction  . Fibromyalgia   . Frequent urination   . Gastric ulcer   . GERD (gastroesophageal reflux disease)   . LYYTKPTW(656.8)    "weekly" (12/04/2013)  . Hyperlipidemia   . Hypertension   . Kidney stone   . Myocardial infarction [facility])    unsure about MI history, but had normal cath that admission '07 ([facility])  . Nausea & vomiting   . OSA (obstructive sleep apnea) 04/24/2013   "use BiPAP"  . Pancreatitis   . Pneumonia    "several times"  . RMSF [facility] spotted fever) 11/22/2012  . Seasonal allergies   . Shortness of breath dyspnea    with ambulation  . Stroke [facility]) 03/2013   "still weak on the left side" (12/05/2013)  . Stroke [facility]) 12/04/2013   "came in today w/right sided weakness" (12/04/2013)  . Type II diabetes mellitus [facility])     Patient Active Problem List   Diagnosis Date Noted  . Unstable angina ([place]) 07/02/2019  . Diastolic heart failure ([place]) 11/22/2016  . Memory difficulties 09/19/2016  . Polypharmacy 09/19/2016  . Pain of left upper extremity 08/15/2016  . Angiodysplasia of  colon   . B12 deficiency 07/06/2016  . Depression, recurrent ([place]) 07/06/2016  . Chronic respiratory failure with hypoxia (HCC)/ noct hypoxemia  05/28/2016  . Bloating 05/24/2016  . Dysphagia 05/24/2016  . Heme + stool 05/24/2016  . Rectal bleeding 05/24/2016  . History of ischemic stroke without residual deficits 04/21/2016  . Family history of abdominal aortic aneurysm (AAA) 04/21/2016  . Dyspnea, unspecified 03/02/2016  . HNP (herniated nucleus pulposus), lumbar 06/18/2014  . Generalized abdominal pain 02/18/2014  . Nausea with vomiting 01/12/2014  . Diabetes mellitus type II, uncontrolled ([place]) 01/12/2014  . Ulcer of intestine 01/12/2014  . Weakness 12/04/2013  . Morbid (severe) obesity due to excess calories ([place]) 04/24/2013  . COPD GOLD   III 04/24/2013  . Fibromyalgia 04/24/2013  . Mixed hyperlipidemia 04/24/2013  . Headache(784.0) 04/24/2013  . OSA (obstructive sleep apnea) 04/24/2013  . Encounter for long-term (current) use of high-risk medication 04/24/2013  . NASH (nonalcoholic steatohepatitis) 11/20/2012  . Essential hypertension 11/20/2012    Past Surgical History:  Procedure Laterality Date  . CARDIAC CATHETERIZATION     10/28/05: right dominant, normal LV systolic function, normal coronaries. (Dr. Baxter Hire, [facility])  . CHOLECYSTECTOMY    . COLONOSCOPY    . COLONOSCOPY WITH PROPOFOL N/A 07/08/2016   Procedure: COLONOSCOPY WITH PROPOFOL;  Surgeon: Gatha Mayer, MD;  Location: [facility] ENDOSCOPY;  Service: Endoscopy;  Laterality: N/A;  . CYSTOSCOPY W/ STONE MANIPULATION    . LUMBAR LAMINECTOMY/DECOMPRESSION MICRODISCECTOMY Left 06/18/2014   Procedure: LUMBAR LAMINECTOMY/DECOMPRESSION MICRODISCECTOMY LEFT LUMBAR FIVE-SACRAL ONE;  Surgeon: Ashok Pall, MD;  Location: [facility]O ORS;  Service: Neurosurgery;  Laterality: Left;  left  . multiple cysts removal-hip,wrist    . TONSILLECTOMY  ~ 1985  . UPPER GASTROINTESTINAL ENDOSCOPY         Family History  Problem Relation Age of Onset  . Skin cancer Father   . Heart attack Father   . Stomach cancer Father   . Hypertension Father   . Aneurysm Father   . Lung cancer Paternal Grandfather   . Heart disease Paternal Grandfather   . Stomach cancer Maternal Grandfather   . Bone cancer Paternal Grandmother   . Heart attack Paternal Grandmother   . Diabetes Mother   . Hypertension Mother   . Arthritis Mother   . Other Mother        brain stem vertigo/disorder of pancreas  . Diabetes Maternal Grandmother   . Heart disease Maternal Grandmother   . Aneurysm Maternal Uncle   . Aneurysm Maternal Uncle   . Colon cancer Neg Hx   . Esophageal cancer Neg Hx   . Rectal cancer Neg Hx     Social History   Tobacco Use  . Smoking status: Former Smoker    Packs/day: 2.00     Years: 21.00    Pack years: 42.00    Types: Cigarettes    Quit date: 05/23/2002    Years since quitting: 17.1  . Smokeless tobacco: Never Used  Substance Use Topics  . Alcohol use: No  . Drug use: No    Home Medications Prior to Admission medications   Medication Sig Start Date End Date Taking? Authorizing Provider  ACCU-CHEK SOFTCLIX LANCETS lancets USE TO CHECK BLOOD-SUGAR UP TO 4 TIMES DAILY (DX CODE E11.65) 10/27/16   Golden Circle, FNP  albuterol (ACCUNEB) 1.25 MG/3ML nebulizer solution Take 1 ampule by nebulization every 6 (six) hours as needed for wheezing.    [provider]  Celedonio Miyamoto 62.5-25  MCG/INH AEPB INHALE 1 PUFF INTO THE LUNGS DAILY. 02/09/17   Tanda Rockers, MD  bisoprolol (ZEBETA) 5 MG tablet TAKE 1 TABLET TWICE DAILY 02/09/17   Tanda Rockers, MD  blood glucose meter kit and supplies KIT Dispense based on patient and insurance preference. Use up to four times daily as directed. Dx Code: E11.65 09/19/16   Golden Circle, FNP  citalopram (CELEXA) 40 MG tablet TAKE 1 TABLET (40 MG TOTAL) DAILY. 01/31/17   Golden Circle, FNP  clopidogrel (PLAVIX) 75 MG tablet TAKE 1 TABLET EVERY DAY 02/09/17   Golden Circle, FNP  cyclobenzaprine (FLEXERIL) 10 MG tablet TAKE 1 TABLET THREE TIMES DAILY AS NEEDED FOR MUSCLE SPASMS 03/14/17   Lance Sell, NP  docusate sodium (COLACE) 100 MG capsule Take 100 mg by mouth daily as needed for mild constipation.    [provider]  fenofibrate 160 MG tablet Take 160 mg by mouth daily. 06/22/16   [provider]  finasteride (PROSCAR) 5 MG tablet Take 5 mg by mouth daily.    [provider]  fluticasone (FLONASE) 50 MCG/ACT nasal spray USE 2 SPRAYS IN EACH NOSTRIL ONE TIME DAILY AS NEEDED FOR ALLERGIES 12/09/16   Tanda Rockers, MD  furosemide (LASIX) 40 MG tablet TAKE 1/2 TABLET TWICE DAILY (MUST MAKE APPOINTMENT) 01/15/19   Croitoru, Dani Gobble, MD  gabapentin (NEURONTIN) 600 MG tablet Take 2  tablets (1,200 mg total) by mouth 3 (three) times daily. 09/14/16   Golden Circle, FNP  glimepiride (AMARYL) 2 MG tablet TAKE 1 TABLET (2 MG TOTAL) DAILY. 02/09/17   Golden Circle, FNP  glucose blood (ACCU-CHEK AVIVA PLUS) test strip USE TO CHECK BLOOD-SUGAR UP TO 4 TIMES DAILY (DX CODE E11.65) 02/15/17   Golden Circle, FNP  insulin detemir (LEVEMIR) 100 UNIT/ML injection Inject 0.6 mLs (60 Units total) into the skin at bedtime. Patient taking differently: Inject 132 Units into the skin at bedtime.  09/09/16   Golden Circle, FNP  irbesartan (AVAPRO) 300 MG tablet TAKE 1 TABLET EVERY DAY 12/09/16   Tanda Rockers, MD  L-Lysine 1000 MG TABS Take 1,000 mg by mouth daily as needed (for fever blisters).    [provider]  LEVEMIR FLEXTOUCH 100 UNIT/ML Pen INJECT  60 UNITS SUBCUTANEOUSLY AT BEDTIME 02/09/17   Golden Circle, FNP  liraglutide 18 MG/3ML SOPN Inject 0.3 mLs (1.8 mg total) into the skin daily. 08/15/16   Golden Circle, FNP  LORazepam (ATIVAN) 1 MG tablet TAKE 1 TABLET EVERY 8 HOURS AS NEEDED FOR ANXIETY 02/01/17   Golden Circle, FNP  LYRICA 75 MG capsule TAKE 1 CAPSULE TWICE DAILY 02/21/17   Golden Circle, FNP  memantine (NAMENDA) 10 MG tablet TAKE 1 TABLET TWICE DAILY 01/31/17   Golden Circle, FNP  NOVOLOG FLEXPEN 100 UNIT/ML FlexPen INJECT 20 TO 30 UNITS SUBCUTANEOUSLY FOUR TIMES DAILY 12/09/16   Golden Circle, FNP  ondansetron (ZOFRAN ODT) 4 MG disintegrating tablet Take 1 tablet (4 mg total) by mouth every 8 (eight) hours as needed for nausea or vomiting. 11/22/16   Golden Circle, FNP  OXYGEN 2lpm with sleep and occ as needed during the day [employer]    [provider]  pantoprazole (PROTONIX) 40 MG tablet TAKE 1 TABLET EVERY DAY 01/31/17   Golden Circle, FNP  Potassium Gluconate 595 MG CAPS Take 2 capsules by mouth daily.    [provider]  promethazine (PHENERGAN) 25 MG  tablet Take 1 tablet (25 mg total) by mouth every 6  (six) hours as needed for nausea or vomiting. 02/11/14   Ward, Delice Bison, DO  Specialty Vitamins Products (MAGNESIUM, AMINO ACID CHELATE,) 133 MG tablet Take 1 tablet by mouth 2 (two) times daily.    [provider]  sucralfate (CARAFATE) 1 g tablet Take 1 g by mouth 4 (four) times daily -  with meals and at bedtime.    [provider]  traMADol (ULTRAM) 50 MG tablet TAKE 1 TO 2 TABLETS BY MOUTH EVERY 8 HOURS AS NEEDED FOR MODERATE PAIN 02/21/17   Golden Circle, FNP  VENTOLIN HFA 108 (90 Base) MCG/ACT inhaler INHALE 2 PUFFS EVERY 6 HOURS AS NEEDED 12/16/16   Parrett, Fonnie Mu, NP    Allergies    Actos [pioglitazone] and Aricept [donepezil hcl]  Review of Systems   Review of Systems  Constitutional: Negative for chills and fever.  HENT: Negative for congestion.   Respiratory: Positive for shortness of breath.   Cardiovascular: Positive for chest pain and leg swelling.  Gastrointestinal: Positive for abdominal distention. Negative for abdominal pain, diarrhea, nausea and vomiting.  Musculoskeletal: Negative for myalgias.  Neurological: Negative for headaches.  All other systems reviewed and are negative.   Physical Exam Updated Vital Signs BP (!) 151/74   Pulse 71   Temp 99.2 F (37.3 C) (Oral)   Resp 17   Ht 5' 9"  (1.753 m)   Wt 115.7 kg   SpO2 97% Comment: rm air  BMI 37.66 kg/m   Physical Exam Vitals and nursing note reviewed.  Constitutional:      Appearance: He is obese. He is not ill-appearing or diaphoretic.  HENT:     Head: Normocephalic and atraumatic.  Eyes:     Conjunctiva/sclera: Conjunctivae normal.  Cardiovascular:     Rate and Rhythm: Normal rate and regular rhythm.     Pulses: Normal pulses.  Pulmonary:     Effort: Pulmonary effort is normal.     Breath sounds: Normal breath sounds. No decreased breath sounds, wheezing, rhonchi or rales.  Chest:     Chest wall: No tenderness.  Abdominal:     General: Abdomen is protuberant.      Palpations: Abdomen is soft.     Tenderness: There is abdominal tenderness. There is no guarding or rebound.     Comments: Soft, diffuse abdominal TTP, +BS throughout, no r/g/r, neg murphy's, neg mcburney's, no CVA TTP  Musculoskeletal:     Cervical back: Neck supple.     Right lower leg: Edema present.     Left lower leg: Edema present.     Comments: Nonpitting edema bilaterally  Skin:    General: Skin is warm and dry.  Neurological:     Mental Status: He is alert.     ED Results / Procedures / Treatments   Labs (all labs ordered are listed, but only abnormal results are displayed) Labs Reviewed  URINALYSIS, ROUTINE W REFLEX MICROSCOPIC - Abnormal; Notable for the following components:      Result Value   Glucose, UA >=500 (*)    All other components within normal limits  COMPREHENSIVE METABOLIC PANEL - Abnormal; Notable for the following components:   Potassium 3.2 (*)    Alkaline Phosphatase 29 (*)    All other components within normal limits  URINALYSIS, MICROSCOPIC (REFLEX) - Abnormal; Notable for the following components:   Bacteria, UA RARE (*)    All other components within normal limits  SARS CORONAVIRUS 2 AG (30 MIN TAT)  RESPIRATORY PANEL BY RT PCR (FLU A&B, COVID)  BRAIN NATRIURETIC PEPTIDE  D-DIMER, QUANTITATIVE (NOT AT [facility])  CBC WITH DIFFERENTIAL/PLATELET  LIPASE, BLOOD  CBC WITH DIFFERENTIAL/PLATELET  TROPONIN I (HIGH SENSITIVITY)  TROPONIN I (HIGH SENSITIVITY)    EKG EKG Interpretation  Date/Time:  Tuesday July 02 2019 16:38:12 EST Ventricular Rate:  76 PR Interval:    QRS Duration: 101 QT Interval:  408 QTC Calculation: 459 R Axis:   62 Text Interpretation: Sinus rhythm Minimal ST depression, diffuse leads unchanged. no stemi Confirmed by Nanda Quinton [phone]) on 07/02/2019 4:41:13 PM   Radiology DG Chest Port 1 View  Result Date: 07/02/2019 CLINICAL DATA:  Short of breath, weakness, fatigue EXAM: PORTABLE CHEST 1 VIEW COMPARISON:  11/27/2016  FINDINGS: The heart size and mediastinal contours are within normal limits. Both lungs are clear. The visualized skeletal structures are unremarkable. IMPRESSION: No active disease. Electronically Signed   By: Randa Ngo M.D.   On: 07/02/2019 15:00   CT Angio Chest/Abd/Pel for Dissection W and/or W/WO  Result Date: 07/02/2019 CLINICAL DATA:  Shortness of breath, weakness, fatigue.  Chest pain EXAM: CT ANGIOGRAPHY CHEST, ABDOMEN AND PELVIS TECHNIQUE: Multidetector CT imaging through the chest, abdomen and pelvis was performed using the standard protocol during bolus administration of intravenous contrast. Multiplanar reconstructed images and MIPs were obtained and reviewed to evaluate the vascular anatomy. CONTRAST:  112m OMNIPAQUE IOHEXOL 350 MG/ML SOLN COMPARISON:  09/26/2018 FINDINGS: CTA CHEST FINDINGS Cardiovascular: Heart is normal size. Aorta normal caliber. No dissection. No filling defects in the pulmonary arteries to suggest pulmonary emboli. Mediastinum/Nodes: No mediastinal, hilar, or axillary adenopathy. Trachea and esophagus are unremarkable. Thyroid unremarkable. Lungs/Pleura: Lungs are clear. No focal airspace opacities or suspicious nodules. No effusions. Musculoskeletal: No acute bony abnormality. Chest wall soft tissues unremarkable. Review of the MIP images confirms the above findings. CTA ABDOMEN AND PELVIS FINDINGS VASCULAR Aorta: Normal caliber aorta without aneurysm, dissection, vasculitis or significant stenosis. Scattered calcifications in the infrarenal aorta. Celiac: Patent without evidence of aneurysm, dissection, vasculitis or significant stenosis. SMA: Patent without evidence of aneurysm, dissection, vasculitis or significant stenosis. Renals: Both renal arteries are patent without evidence of aneurysm, dissection, vasculitis, fibromuscular dysplasia or significant stenosis. IMA: Patent without evidence of aneurysm, dissection, vasculitis or significant stenosis. Inflow: Patent  without evidence of aneurysm, dissection, vasculitis or significant stenosis. Veins: No obvious venous abnormality within the limitations of this arterial phase study. Review of the MIP images confirms the above findings. NON-VASCULAR Hepatobiliary: Prior cholecystectomy.  No focal hepatic abnormality. Pancreas: No focal abnormality or ductal dilatation. Spleen: No focal abnormality.  Normal size. Adrenals/Urinary Tract: No adrenal abnormality. No focal renal abnormality. No stones or hydronephrosis. Urinary bladder is unremarkable. Stomach/Bowel: Normal appendix. Stomach, large and small bowel grossly unremarkable. Lymphatic: No adenopathy Reproductive: Prostate enlargement. Brachy therapy seeds in the region of the prostate. Other: No free fluid or free air. Musculoskeletal: No acute bony abnormality. Review of the MIP images confirms the above findings. IMPRESSION: No evidence of aortic aneurysm or dissection. Atherosclerotic calcifications in the infrarenal aorta. No acute cardiopulmonary disease. No acute findings in the abdomen or pelvis. Electronically Signed   By: KRolm BaptiseM.D.   On: 07/02/2019 17:32    Procedures Procedures (including critical care time)  Medications Ordered in ED Medications  nitroGLYCERIN (NITROSTAT) SL tablet 0.4 mg (0.4 mg Sublingual Given 07/02/19 1821)  morphine 4 MG/ML injection 4 mg (4 mg Intravenous Given 07/02/19 1702)  iohexol (OMNIPAQUE) 350 MG/ML injection 100 mL (100 mLs Intravenous Contrast Given 07/02/19 1711)  potassium chloride SA (KLOR-CON) CR tablet 40 mEq (40 mEq Oral Given 07/02/19 1739)  aspirin chewable tablet 324 mg (324 mg Oral Given 07/02/19 1759)    ED Course  I have reviewed the triage vital signs and the nursing notes.  Pertinent labs & imaging results that were available during my care of the patient were reviewed by me and considered in my medical decision making (see chart for details).  59 year old male who presents to the ED today complaining  of shortness of breath, weakness, fatigue, chest tightness for the past several days.  No active chest pain currently.  He reports that he was hypotensive yesterday and his oxygen saturation decreased to 88% on room air.  Denies COVID-19 positive exposure.  On arrival to the ED patient's temperature 99.2.  Nontachycardic and nontachypneic.  Blood pressure 146/85.  Satting 97% on room air.  Does not appear to be working for air at this time.  No accessory muscle use.  Lungs clear to auscultation bilaterally.  Patient does report he feels he has had a 10 pound weight gain in the past 2 to 3 weeks and feels like his abdomen is more distended.  He does have history of congestive heart failure.  Currently on Lasix.  Differential diagnosis includes ACS, heart failure exacerbation, PE, Covid.  Clinical Course as of Jul 01 1846  Tue Jul 02, 2019  1542 WBC: 6.1 [MV]  1542 Troponin I (High Sensitivity): 3 [MV]  1542 B Natriuretic Peptide: 35.6 [MV]  1542 SARS Coronavirus 2 Ag: NEGATIVE [MV]  3428 D-Dimer, Quant: <0.27 [MV]    Clinical Course User Index [MV] Eustaquio Maize, PA-C   CXR negative. Labs very reassuring.   Repeat trop unchanged at 3.   Nursing staff informed that while ambulating patient to the restroom he became diaphoretic and complaining of chest pain/tightness. On reeval pt laying in bed with active chest pain, describes it as a "squeezing" type pain with paresthesias down LUE. Will obtain dissection study at this time.   Dissection study negative. Pt still complaining of pain. NTG started.   Will consult cardiology at this time. Story concerning for unstable angina.   Discussed case with Dr. Marlou Porch who agrees to accept patient for admission.   MDM Rules/Calculators/A&P HEAR Score: 6                    Final Clinical Impression(s) / ED Diagnoses Final diagnoses:  Chest pain, unspecified type  Unstable angina [facility])    Rx / DC Orders ED Discharge Orders    None         Eustaquio Maize, PA-C 07/02/19 Carollee Massed, MD 07/03/19 [phone]

## 2019-07-02 NOTE — ED Notes (Signed)
ED Provider at bedside. Pt reports improvement in chest pain following nitro, declines second dose, provider spoke to patient to encourage second dose, pt agrees.

## 2019-07-02 NOTE — ED Triage Notes (Signed)
Sob, weakness, and fatigue. No Covid exposure.

## 2019-07-02 NOTE — ED Notes (Signed)
Pt ambulated to bathroom with walking stick.  Became slightly sweaty and weak, recovered quickly once back in bed. RT at bedside.  Sat 98% rm air. Denies chest pain.

## 2019-07-02 NOTE — ED Notes (Signed)
Report given to [facility]. 

## 2019-07-02 NOTE — ED Notes (Signed)
Pt given a diet coke and some crackers

## 2019-07-03 ENCOUNTER — Observation Stay (HOSPITAL_BASED_OUTPATIENT_CLINIC_OR_DEPARTMENT_OTHER): Payer: Medicare HMO

## 2019-07-03 ENCOUNTER — Other Ambulatory Visit: Payer: Self-pay | Admitting: Medical

## 2019-07-03 ENCOUNTER — Observation Stay (HOSPITAL_COMMUNITY): Payer: Medicare HMO

## 2019-07-03 DIAGNOSIS — I5032 Chronic diastolic (congestive) heart failure: Secondary | ICD-10-CM

## 2019-07-03 DIAGNOSIS — R079 Chest pain, unspecified: Secondary | ICD-10-CM | POA: Diagnosis not present

## 2019-07-03 DIAGNOSIS — F0390 Unspecified dementia without behavioral disturbance: Secondary | ICD-10-CM | POA: Diagnosis not present

## 2019-07-03 DIAGNOSIS — J449 Chronic obstructive pulmonary disease, unspecified: Secondary | ICD-10-CM | POA: Diagnosis not present

## 2019-07-03 DIAGNOSIS — R0789 Other chest pain: Secondary | ICD-10-CM

## 2019-07-03 DIAGNOSIS — E785 Hyperlipidemia, unspecified: Secondary | ICD-10-CM

## 2019-07-03 DIAGNOSIS — I25110 Atherosclerotic heart disease of native coronary artery with unstable angina pectoris: Secondary | ICD-10-CM | POA: Diagnosis not present

## 2019-07-03 DIAGNOSIS — R0602 Shortness of breath: Secondary | ICD-10-CM | POA: Diagnosis not present

## 2019-07-03 LAB — ECHOCARDIOGRAM COMPLETE
Height: 69 in
Weight: 4108.8 oz

## 2019-07-03 LAB — BASIC METABOLIC PANEL
Anion gap: 15 (ref 5–15)
BUN: 14 mg/dL (ref 6–20)
CO2: 21 mmol/L — ABNORMAL LOW (ref 22–32)
Calcium: 8.8 mg/dL — ABNORMAL LOW (ref 8.9–10.3)
Chloride: 104 mmol/L (ref 98–111)
Creatinine, Ser: 0.68 mg/dL (ref 0.61–1.24)
GFR calc Af Amer: >60 mL/min (ref >60)
GFR calc non Af Amer: >60 mL/min (ref >60)
Glucose, Bld: 97 mg/dL (ref 70–99)
Potassium: 3.4 mmol/L — ABNORMAL LOW (ref 3.5–5.1)
Sodium: 140 mmol/L (ref 135–145)

## 2019-07-03 LAB — CBC
HCT: 41.4 % (ref 39.0–52.0)
Hemoglobin: 13.9 g/dL (ref 13.0–17.0)
MCH: 29.6 pg (ref 26.0–34.0)
MCHC: 33.6 g/dL (ref 30.0–36.0)
MCV: 88.1 fL (ref 80.0–100.0)
Platelets: 90 10*3/uL — ABNORMAL LOW (ref 150–400)
RBC: 4.7 MIL/uL (ref 4.22–5.81)
RDW: 13.2 % (ref 11.5–15.5)
WBC: 5.1 10*3/uL (ref 4.0–10.5)
nRBC: 0 % (ref 0.0–0.2)

## 2019-07-03 LAB — GLUCOSE, CAPILLARY
Glucose-Capillary: 91 mg/dL (ref 70–99)
Glucose-Capillary: 88 mg/dL (ref 70–99)
Glucose-Capillary: 247 mg/dL — ABNORMAL HIGH (ref 70–99)

## 2019-07-03 LAB — HIV ANTIBODY (ROUTINE TESTING W REFLEX): HIV Screen 4th Generation wRfx: NONREACTIVE

## 2019-07-03 LAB — LIPID PANEL
Cholesterol: 115 mg/dL (ref 0–200)
HDL: 26 mg/dL — ABNORMAL LOW (ref >40)
LDL Cholesterol: 38 mg/dL (ref 0–99)
Total CHOL/HDL Ratio: 4.4 RATIO
Triglycerides: 253 mg/dL — ABNORMAL HIGH (ref <150)
VLDL: 51 mg/dL — ABNORMAL HIGH (ref 0–40)

## 2019-07-03 LAB — TROPONIN I (HIGH SENSITIVITY): Troponin I (High Sensitivity): 5 ng/L (ref <18)

## 2019-07-03 MED ORDER — FUROSEMIDE 10 MG/ML IJ SOLN: 60.0000 mg | Freq: Once | INTRAMUSCULAR | Status: DC

## 2019-07-03 MED ORDER — METOPROLOL TARTRATE 50 MG PO TABS: 50.0000 mg | ORAL_TABLET | Freq: Once | ORAL | Status: DC

## 2019-07-03 MED ORDER — NITROGLYCERIN 0.4 MG SL SUBL
SUBLINGUAL_TABLET | SUBLINGUAL | Status: AC
  Administered 2019-07-03: 13:00:00 0.8 mg via SUBLINGUAL
  Filled 2019-07-03: qty 2

## 2019-07-03 MED ORDER — POTASSIUM CHLORIDE CRYS ER 20 MEQ PO TBCR
20.0000 meq | EXTENDED_RELEASE_TABLET | Freq: Once | ORAL | Status: AC
  Administered 2019-07-03: 14:00:00 20 meq via ORAL
  Filled 2019-07-03: qty 1

## 2019-07-03 MED ORDER — METOPROLOL TARTRATE 5 MG/5ML IV SOLN
INTRAVENOUS | Status: AC
  Administered 2019-07-03: 13:00:00 5 mg
  Filled 2019-07-03: qty 5

## 2019-07-03 MED ORDER — ATORVASTATIN CALCIUM 80 MG PO TABS: 80.0000 mg | ORAL_TABLET | Freq: Every day | ORAL | Status: DC

## 2019-07-03 MED ORDER — IOHEXOL 350 MG/ML SOLN
100.0000 mL | Freq: Once | INTRAVENOUS | Status: AC | PRN
  Administered 2019-07-03: 13:00:00 100 mL via INTRAVENOUS

## 2019-07-03 NOTE — Progress Notes (Addendum)
Progress Note  Patient Name: Bryan Becker Date of Encounter: 07/03/2019  Primary Cardiologist: No primary care provider on file.   Subjective   Patient denies recurrent chest pain since being in the hospital. He does feel some weakness and shortness of breath. Coronary CT today.  Inpatient Medications    Scheduled Meds: . aspirin EC  81 mg Oral Daily  . bisoprolol  5 mg Oral BID  . citalopram  40 mg Oral Daily  . clopidogrel  75 mg Oral Daily  . fenofibrate  160 mg Oral Daily  . fluticasone  1 spray Each Nare BID  . furosemide  40 mg Oral BID  . glimepiride  4 mg Oral BID WC  . insulin aspart  0-15 Units Subcutaneous TID WC  . liraglutide  1.8 mg Subcutaneous Daily  . memantine  10 mg Oral BID  . pantoprazole  40 mg Oral Daily  . umeclidinium-vilanterol  1 puff Inhalation Daily   Continuous Infusions:  PRN Meds: acetaminophen, albuterol, cyclobenzaprine, docusate sodium, LORazepam, nitroGLYCERIN, ondansetron (ZOFRAN) IV, ondansetron, promethazine, traMADol   Vital Signs    Vitals:   07/02/19 2030 07/02/19 2259 07/03/19 0050 07/03/19 0509  BP: 138/71 (!) 148/67  (!) 144/68  Pulse: 85 72 76 67  Resp: 18  16 18   Temp:  (!) 97.5 F (36.4 C)  98.3 F (36.8 C)  TempSrc:  Oral  Oral  SpO2: 95% 97% 98% 96%  Weight:    116.5 kg  Height:    5' 9"  (1.753 m)    Intake/Output Summary (Last 24 hours) at 07/03/2019 0824 Last data filed at 07/03/2019 0600 Gross per 24 hour  Intake 360 ml  Output 1010 ml  Net -650 ml   Last 3 Weights 07/03/2019 07/02/2019 02/01/2017  Weight (lbs) 256 lb 12.8 oz 255 lb 288 lb 9.6 oz  Weight (kg) 116.484 kg 115.667 kg 130.908 kg      Telemetry    NSR HR 60-70 - Personally Reviewed  ECG    NSR, 67 bpm, no ST/T wave changes - Personally Reviewed  Physical Exam   GEN: No acute distress.   Neck: No JVD Cardiac: RRR, no murmurs, rubs, or gallops.  Respiratory: Clear to auscultation bilaterally. GI: Soft, nontender, non-distended   MS: No edema; No deformity. Neuro:  Nonfocal  Psych: Normal affect   Labs    High Sensitivity Troponin:   Recent Labs  Lab 07/02/19 1457 07/02/19 1608 07/03/19 0139  TROPONINIHS 3 3 5       Chemistry Recent Labs  Lab 07/02/19 1527 07/03/19 0139  NA 141 140  K 3.2* 3.4*  CL 106 104  CO2 28 21*  GLUCOSE 92 97  BUN 16 14  CREATININE 0.64 0.68  CALCIUM 9.2 8.8*  PROT 7.0  --   ALBUMIN 4.4  --   AST 17  --   ALT 16  --   ALKPHOS 29*  --   BILITOT 0.4  --   GFRNONAA >60 >60  GFRAA >60 >60  ANIONGAP 7 15     Hematology Recent Labs  Lab 07/02/19 1527 07/03/19 0139  WBC 6.1 5.1  RBC 4.83 4.70  HGB 14.6 13.9  HCT 43.1 41.4  MCV 89.2 88.1  MCH 30.2 29.6  MCHC 33.9 33.6  RDW 13.4 13.2  PLT 160 90*    BNP Recent Labs  Lab 07/02/19 1457  BNP 35.6     DDimer  Recent Labs  Lab 07/02/19 1600  DDIMER <0.27  Radiology    DG Chest Port 1 View  Result Date: 07/02/2019 CLINICAL DATA:  Short of breath, weakness, fatigue EXAM: PORTABLE CHEST 1 VIEW COMPARISON:  11/27/2016 FINDINGS: The heart size and mediastinal contours are within normal limits. Both lungs are clear. The visualized skeletal structures are unremarkable. IMPRESSION: No active disease. Electronically Signed   By: Randa Ngo M.D.   On: 07/02/2019 15:00   CT Angio Chest/Abd/Pel for Dissection W and/or W/WO  Result Date: 07/02/2019 CLINICAL DATA:  Shortness of breath, weakness, fatigue.  Chest pain EXAM: CT ANGIOGRAPHY CHEST, ABDOMEN AND PELVIS TECHNIQUE: Multidetector CT imaging through the chest, abdomen and pelvis was performed using the standard protocol during bolus administration of intravenous contrast. Multiplanar reconstructed images and MIPs were obtained and reviewed to evaluate the vascular anatomy. CONTRAST:  167m OMNIPAQUE IOHEXOL 350 MG/ML SOLN COMPARISON:  09/26/2018 FINDINGS: CTA CHEST FINDINGS Cardiovascular: Heart is normal size. Aorta normal caliber. No dissection. No filling  defects in the pulmonary arteries to suggest pulmonary emboli. Mediastinum/Nodes: No mediastinal, hilar, or axillary adenopathy. Trachea and esophagus are unremarkable. Thyroid unremarkable. Lungs/Pleura: Lungs are clear. No focal airspace opacities or suspicious nodules. No effusions. Musculoskeletal: No acute bony abnormality. Chest wall soft tissues unremarkable. Review of the MIP images confirms the above findings. CTA ABDOMEN AND PELVIS FINDINGS VASCULAR Aorta: Normal caliber aorta without aneurysm, dissection, vasculitis or significant stenosis. Scattered calcifications in the infrarenal aorta. Celiac: Patent without evidence of aneurysm, dissection, vasculitis or significant stenosis. SMA: Patent without evidence of aneurysm, dissection, vasculitis or significant stenosis. Renals: Both renal arteries are patent without evidence of aneurysm, dissection, vasculitis, fibromuscular dysplasia or significant stenosis. IMA: Patent without evidence of aneurysm, dissection, vasculitis or significant stenosis. Inflow: Patent without evidence of aneurysm, dissection, vasculitis or significant stenosis. Veins: No obvious venous abnormality within the limitations of this arterial phase study. Review of the MIP images confirms the above findings. NON-VASCULAR Hepatobiliary: Prior cholecystectomy.  No focal hepatic abnormality. Pancreas: No focal abnormality or ductal dilatation. Spleen: No focal abnormality.  Normal size. Adrenals/Urinary Tract: No adrenal abnormality. No focal renal abnormality. No stones or hydronephrosis. Urinary bladder is unremarkable. Stomach/Bowel: Normal appendix. Stomach, large and small bowel grossly unremarkable. Lymphatic: No adenopathy Reproductive: Prostate enlargement. Brachy therapy seeds in the region of the prostate. Other: No free fluid or free air. Musculoskeletal: No acute bony abnormality. Review of the MIP images confirms the above findings. IMPRESSION: No evidence of aortic aneurysm  or dissection. Atherosclerotic calcifications in the infrarenal aorta. No acute cardiopulmonary disease. No acute findings in the abdomen or pelvis. Electronically Signed   By: KRolm BaptiseM.D.   On: 07/02/2019 17:32    Cardiac Studies   Echo ordered  Echo 2017 Study Conclusions   - Left ventricle: The cavity size was normal. Wall thickness was  increased in a pattern of mild LVH. Systolic function was normal.  The estimated ejection fraction was in the range of 60% to 65%.  Wall motion was normal; there were no regional wall motion  abnormalities.  - Aorta: Ascending aortic diameter: 38 mm (S).  - Ascending aorta: The ascending aorta was mildly dilated.   Patient Profile     59y.o. male with h/o of chronic diastolic heart failure on lasix, clean cardiac cath 15 years ago, morbid obesity, DM2, h/o CVA, COPD, OSA, cirrhosis due to NASH who is being evaluated for chest pain.  Assessment & Plan    Atypical chest pain - Patient reported a clean cardiac cath 15 years ago -  He came in for progressive SOB and chest discomfort relieved by NTG - CTA chest negative for aortic pathology - HS troponin negative x 2. EKG unremarkable - check an echo - Plan for coronary CTA today. Creatinine stable. Hold AM bisoprolol as patient will receive IV BB prior exam  Shortness of breath/Diastolic heart failure - Patient reported DOE for the last couple days - He says he takes lasix at home but has gained about 10 lbs over the last 2 weeks. He feels his abdomen is firm - Echo from 2017 showed EF 60-65%, no RWMA. Repeat echo ordered - Patient was started on lasix 40 mg BID - He has put out 1 L urine in 24 hours - Monitor daily weights  HTN - continue bisoprolol 5 mg BID. Home med irbesartan held  DM2 - SSI  Cirrhosis - no change on CT   For questions or updates, please contact [facility] Please consult [email or web address] for contact info under   Signed, Cadence Ninfa Meeker, PA-C   07/03/2019, 8:24 AM    Patient seen and examined.  Agree with above documentation.  On exam, patient is alert and oriented, regular rate and rhythm, no murmurs, lungs CTAB, no LE edema or JVD.  He has been having atypical chest pain.  Pain has been intermittent, longest episode lasted 20 minutes and resolved with nitroglycerin.  Negative troponins.  EKG with no acute ischemic changes.  Does have significant risk factors, including marked hyperlipidemia (LDL of 253).  CTA chest yesterday showed no aortic pathology, but does have coronary calcifications.  Planning for coronary CTA today.    Donato Heinz, MD

## 2019-07-03 NOTE — Discharge Summary (Signed)
Discharge Summary    Patient ID: Bryan Becker MRN: [id number]; DOB: 03/07/1961  Admit date: 07/02/2019 Discharge date: 07/03/2019  Primary Care Provider: Robert Bellow, PA-C  Primary Cardiologist: Sanda Klein, MD  Primary Electrophysiologist:  None   Discharge Diagnoses    Active Problems:   Unstable angina [facility])   Chest pain    Diagnostic Studies/Procedures    Echocardiogram 07/03/19 1. Left ventricular ejection fraction, by estimation, is 65 to 70%. The  left ventricle has normal function. The left ventrical has no regional  wall motion abnormalities. Left ventricular diastolic parameters are  indeterminate. Elevated left ventricular  end-diastolic pressure.  2. Right ventricular systolic function is normal. The right ventricular  size is mildly enlarged. Tricuspid regurgitation signal is inadequate for  assessing PA pressure.  3. The mitral valve is normal in structure and function. no evidence of  mitral valve regurgitation. No evidence of mitral stenosis.  4. The aortic valve is normal in structure and function. Aortic valve  regurgitation is not visualized. No aortic stenosis is present.  5. The inferior vena cava is dilated in size with >50% respiratory  variability, suggesting right atrial pressure of 8 mmHg.   Coronary CTA 07/03/19 IMPRESSION: 1. Coronary calcium score of 581. This was 31 percentile for age and sex matched control.  2. Normal coronary origin with right dominance.  3. Nonobstructive CAD. Calcified plaque throughout LAD and RCA causing minimal (0-24%) stenosis  4. 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 Minimal non-obstructive CAD (0-24%). Consider non-atherosclerotic causes of chest pain. Consider preventive therapy and risk factor modification.  Over-read interpretation  IMPRESSION: 1. Mild thoracic aortic dilation. Recommend annual imaging followup by CTA or MRA. This recommendation follows  2010 ACCF/AHA/AATS/ACR/ASA/SCA/SCAI/SIR/STS/SVM Guidelines for the Diagnosis and Management of Patients with Thoracic Aortic Disease. Circulation.2010; 121:: P915-A569 Aortic aneurysm NOS (ICD10-I71.9) 2. 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 Significance uncertain. Consider interventional radiology referral for further evaluation as clinically warranted. 3. Please see dedicated report regarding cardiac structures_____________   History of Present Illness     Bryan GOYERis a 59y.o. male with history of chronic diastolic heart failure (EF 60-65%), obesity, DM2, h/o CVA, OSA, cirrhosis due to NASH who [facility]. He had felt a constant chest pressure and fatigue for the last 2-3 days. He described it as a tightness that radiated across his chest with associated shortness of breath. Denied nausea, vomiting, diaphoresis, or palpitations. Nothing seemed to make the pain better. He also reported abdominal distention and a 10 lb weight gain over the last 2-3 weeks. [facility] had continued chest pain in [facility] ED [facility]   [facility]     Consultants: None   [facility] 129/59, 99.2 degrees F, 96% O2, pulse 8-, RR 15. Labs showed potassium 3.4, glucose 97. WBC 5.1, Hgb 13.9. Rapid respiratory panel was negative. D-dimer was normal. He was given NTG, morphine, and aspirin which improved the chest pain. Potassium was supplemented. CT chest/abdomen/pelvis was negative for aortic pathology. EKG was non-acute and HS troponin was negative x 2 (3>3).He had another episode of chest pain with diaphoresis [facility] and made NPO for possible procedure the next day. He was started on IV  lasix 40 mg BID. Echo showed EF 65 to 70%, no wall motion abnormalities, elevated LVEDP, normal  RV function, dilated IVC suggestive of RA pressure 8 mmHg. 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 Finding of the fistula (from the CT) was discussed with MD and it was decided this was an incidental finding since the patient was not hypoxic. With the lasix the patient put out 1L urine. Given diffuse mild disease in coronary CT starting a statin was considered but given history of cirrhosis this was aborted. Will plan to discharge patient will all his home medications. Will not continue Aspirin on discharge. Hospital follow-up was made.   The patient was evaluated by Dr. Gardiner Rhyme 07/03/19 and felt to be stable for discharge.    Did the patient have an acute coronary syndrome (MI, NSTEMI, STEMI, etc) this admission?:  No                               Did the patient have a percutaneous coronary intervention (stent / angioplasty)?:  No.   _____________  Discharge Vitals Blood pressure 126/70, pulse 75, temperature 98.5 F (36.9 C), temperature source Oral, resp. rate 18, height _0  (1.753 m), weight 116.5 kg, SpO2 99 %.  Filed Weights   07/02/19 1341 07/03/19 0509  Weight: 115.7 kg 116.5 kg    Labs & Radiologic Studies    CBC Recent Labs    07/02/19 1527 07/03/19 0139  WBC 6.1 5.1  NEUTROABS 3.2  --   HGB 14.6 13.9  HCT 43.1 41.4  MCV 89.2 88.1  PLT 160 90*   Basic Metabolic Panel Recent Labs    07/02/19 1527 07/03/19 0139  NA 141 140  K 3.2* 3.4*  CL 106 104  CO2 28 21*  GLUCOSE 92 97  BUN 16 14  CREATININE 0.64 0.68  CALCIUM 9.2 8.8*   Liver Function Tests Recent Labs    07/02/19 1527  AST 17  ALT 16  ALKPHOS 29*  BILITOT 0.4  PROT 7.0  ALBUMIN 4.4   Recent Labs    07/02/19 1527  LIPASE 25   High Sensitivity Troponin:   Recent Labs  Lab 07/02/19 1457  07/02/19 1608 07/03/19 0139  TROPONINIHS _1 BNP Invalid input(s): POCBNP D-Dimer Recent Labs    07/02/19 1600  DDIMER <0.27   Hemoglobin A1C No results for input(s): HGBA1C in the last 72 hours. Fasting Lipid Panel Recent Labs    07/03/19 0313  CHOL 115  HDL 26*  LDLCALC 38  TRIG 253*  CHOLHDL 4.4   Thyroid Function Tests No results for input(s): TSH, T4TOTAL, T3FREE, THYROIDAB in the last 72 hours.  Invalid input(s): FREET3 _____________  CT CORONARY MORPH W/CTA COR W/SCORE W/CA W/CM &/OR WO/CM  Addendum Date: 07/03/2019   ADDENDUM REPORT: 07/03/2019 15:44 CLINICAL DATA:  89M with chest pain EXAM: Cardiac/Coronary CTA TECHNIQUE: The patient was scanned on a [employer]. FINDINGS: A 100 kV prospective scan was triggered in the descending thoracic aorta at 111 HU's. Axial non-contrast 3 mm slices were carried out through the heart. The data set was analyzed on a dedicated work station and scored using the [employer]. Gantry rotation speed was 250 msecs and collimation was .6 mm. No beta blockade and  0.8 mg of sl NTG was given. The 3D data set was reconstructed in 5% intervals of the 67-82 % of the R-R cycle. Diastolic phases were analyzed on a dedicated work station using MPR, MIP and VRT modes. The patient received 80 cc of contrast. Coronary Arteries:  Normal coronary origin.  Right dominance. RCA is a large dominant artery that gives rise to PDA and PLA. There is calcified plaque in the proximal and distal RCA causing minimal (0-24%) stenosis Left main is a large artery that gives rise to LAD and LCX arteries. There is calcified plaque in the distal left main causing minimal (0-24%) stenosis LAD is a large vessel. There is calcified plaque in the proximal and mid LAD causing minimal (0-24%) stenosis. 101m myocardial bridge in mid LAD LCX is a non-dominant artery that gives rise to one large OM1 branch. There is no plaque. Other findings: Left Ventricle: Normal  size Left Atrium: Mild dilatation Pulmonary Veins: Normal configuration Right Ventricle: Normal size Right Atrium: Mild dilatation Cardiac valves: No calcifications Thoracic aorta: Normal size Pulmonary Arteries: Normal size Systemic Veins: Normal drainage Pericardium: Normal thickness IMPRESSION: 1. Coronary calcium score of 581. This was 928percentile for age and sex matched control. 2. Normal coronary origin with right dominance. 3. Nonobstructive CAD. Calcified plaque throughout LAD and RCA causing minimal (0-24%) stenosis 4. 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 Minimal non-obstructive CAD (0-24%). Consider non-atherosclerotic causes of chest pain. Consider preventive therapy and risk factor modification. Electronically Signed   By: ChOswaldo MilianD   On: 07/03/2019 15:44   Result Date: 07/03/2019 EXAM: OVER-READ INTERPRETATION  CT CHEST The following report is an over-read performed by radiologist Dr. GeZetta Billsf [facility] [place] [place] 07/03/2019. This over-read does not include interpretation of cardiac or coronary anatomy or pathology. The coronary calcium score/coronary CTA interpretation by the cardiologist is attached. COMPARISON:  07/02/2019 FINDINGS: Vascular: Ascending aortic caliber 3.5 cm. Descending thoracic aorta at 2.5 cm. Please see dedicated report regarding cardiac structures. Small pulmonary arterial to venous communication on images 112-140 tracking from lower lobe venous branches back to arterial branches in the pulmonary bed. No significant dilation of the vascular bed. Mediastinum/Nodes: Limited imaging of the mediastinum shows no signs of adenopathy. Limited imaging of the esophagus is normal. Lungs/Pleura: Basilar atelectasis. No signs of pleural effusion. Upper Abdomen: Incidental imaging of upper abdominal contents is normal. Musculoskeletal: No signs of acute bone finding or destructive bone process. Spinal degenerative changes. IMPRESSION: 1.  Mild thoracic aortic dilation. Recommend annual imaging followup by CTA or MRA. This recommendation follows 2010 ACCF/AHA/AATS/ACR/ASA/SCA/SCAI/SIR/STS/SVM Guidelines for the Diagnosis and Management of Patients with Thoracic Aortic Disease. Circulation.2010; 121: : H675-F163Aortic aneurysm NOS (ICD10-I71.9) 2. 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 Significance uncertain. Consider interventional radiology referral for further evaluation as clinically warranted. 3. Please see dedicated report regarding cardiac structures. Electronically Signed: By: GeZetta Bills.D. On: 07/03/2019 13:52   DG Chest Port 1 View  Result Date: 07/02/2019 CLINICAL DATA:  Short of breath, weakness, fatigue EXAM: PORTABLE CHEST 1 VIEW COMPARISON:  11/27/2016 FINDINGS: The heart size and mediastinal contours are within normal limits. Both lungs are clear. The visualized skeletal structures are unremarkable. IMPRESSION: No active disease. Electronically Signed   By: MiRanda Ngo.D.   On: 07/02/2019 15:00   ECHOCARDIOGRAM COMPLETE  Result Date: 07/03/2019    ECHOCARDIOGRAM REPORT   Patient Name:   Bryan Becker  [place] Date of Exam: 07/03/2019 Medical Rec #:  [id number]       Height:       69.0 in Accession #:    [id number]      Weight:       256.8 lb Date of Birth:  [birth year]       BSA:          2.30 m Patient Age:    40 years        BP:           144/68 mmHg Patient Gender: M               HR:           78 bpm. Exam Location:  Inpatient Procedure: 2D Echo Indications:    Chest Pain R07.9  History:        Patient has prior history of Echocardiogram examinations, most                 recent 04/18/2016. CHF, COPD; Risk Factors:Hypertension,                 Diabetes and Dyslipidemia.  Sonographer:    Mikki Santee RDCS (AE) Referring Phys: [id number] [place]  1. Left ventricular ejection fraction, by estimation, is 65 to 70%. The left ventricle has normal function.  The left ventrical has no regional wall motion abnormalities. Left ventricular diastolic parameters are indeterminate. Elevated left ventricular end-diastolic pressure.  2. Right ventricular systolic function is normal. The right ventricular size is mildly enlarged. Tricuspid regurgitation signal is inadequate for assessing PA pressure.  3. The mitral valve is normal in structure and function. no evidence of mitral valve regurgitation. No evidence of mitral stenosis.  4. The aortic valve is normal in structure and function. Aortic valve regurgitation is not visualized. No aortic stenosis is present.  5. The inferior vena cava is dilated in size with >50% respiratory variability, suggesting right atrial pressure of 8 mmHg. FINDINGS  Left Ventricle: Left ventricular ejection fraction, by estimation, is 65 to 70%. The left ventricle has normal function. The left ventricle has no regional wall motion abnormalities. The left ventricular internal cavity size was normal in size. There is  no left ventricular hypertrophy. Left ventricular diastolic parameters are indeterminate. Right Ventricle: The right ventricular size is mildly enlarged. No increase in right ventricular wall thickness. Right ventricular systolic function is normal. Tricuspid regurgitation signal is inadequate for assessing PA pressure. Left Atrium: Left atrial size was normal in size. Right Atrium: Right atrial size was normal in size. Pericardium: There is no evidence of pericardial effusion. Mitral Valve: The mitral valve is normal in structure and function. Normal mobility of the mitral valve leaflets. No evidence of mitral valve regurgitation. No evidence of mitral valve stenosis. Tricuspid Valve: The tricuspid valve is normal in structure. Tricuspid valve regurgitation is not demonstrated. No evidence of tricuspid stenosis. Aortic Valve: The aortic valve is normal in structure and function. Aortic valve regurgitation is not visualized. No aortic  stenosis is present. Pulmonic Valve: The pulmonic valve was normal in structure. Pulmonic valve regurgitation is trivial. No evidence of pulmonic stenosis. Aorta: The aortic root is normal in size and structure. Venous: The inferior vena cava is dilated in size with greater than 50% respiratory variability, suggesting right atrial pressure of 8 mmHg. The inferior vena cava and the hepatic vein show a normal flow pattern. IAS/Shunts: No atrial level shunt detected by color flow Doppler.  LEFT VENTRICLE  PLAX 2D LVIDd:         5.10 cm  Diastology LVIDs:         3.00 cm  LV e' lateral:   7.18 cm/s LV PW:         1.00 cm  LV E/e' lateral: 12.6 LV IVS:        1.00 cm  LV e' medial:    6.53 cm/s LVOT diam:     2.20 cm  LV E/e' medial:  13.9 LV SV:         98.83 ml LV SV Index:   36.54 LVOT Area:     3.80 cm  RIGHT VENTRICLE TAPSE (M-mode): 2.0 cm LEFT ATRIUM             Index       RIGHT ATRIUM           Index LA diam:        3.70 cm 1.61 cm/m  RA Area:     18.40 cm LA Vol (A2C):   75.8 ml 32.99 ml/m RA Volume:   48.80 ml  21.24 ml/m LA Vol (A4C):   61.4 ml 26.72 ml/m LA Biplane Vol: 68.9 ml 29.99 ml/m  AORTIC VALVE LVOT Vmax:   109.00 cm/s LVOT Vmean:  75.800 cm/s LVOT VTI:    0.260 m  AORTA Ao Root diam: 3.30 cm MITRAL VALVE MV Area (PHT): 3.42 cm             SHUNTS MV Decel Time: 222 msec             Systemic VTI:  0.26 m MV E velocity: 90.80 cm/s 103 cm/s  Systemic Diam: 2.20 cm MV A velocity: 89.50 cm/s 70.3 cm/s MV E/A ratio:  1.01       1.5 Fransico Him MD Electronically signed by Fransico Him MD Signature Date/Time: 07/03/2019/10:40:08 AM    Final    CT Angio Chest/Abd/Pel for Dissection W and/or W/WO  Result Date: 07/02/2019 CLINICAL DATA:  Shortness of breath, weakness, fatigue.  Chest pain EXAM: CT ANGIOGRAPHY CHEST, ABDOMEN AND PELVIS TECHNIQUE: Multidetector CT imaging through the chest, abdomen and pelvis was performed using the standard protocol during bolus administration of intravenous contrast.  Multiplanar reconstructed images and MIPs were obtained and reviewed to evaluate the vascular anatomy. CONTRAST:  156m OMNIPAQUE IOHEXOL 350 MG/ML SOLN COMPARISON:  09/26/2018 FINDINGS: CTA CHEST FINDINGS Cardiovascular: Heart is normal size. Aorta normal caliber. No dissection. No filling defects in the pulmonary arteries to suggest pulmonary emboli. Mediastinum/Nodes: No mediastinal, hilar, or axillary adenopathy. Trachea and esophagus are unremarkable. Thyroid unremarkable. Lungs/Pleura: Lungs are clear. No focal airspace opacities or suspicious nodules. No effusions. Musculoskeletal: No acute bony abnormality. Chest wall soft tissues unremarkable. Review of the MIP images confirms the above findings. CTA ABDOMEN AND PELVIS FINDINGS VASCULAR Aorta: Normal caliber aorta without aneurysm, dissection, vasculitis or significant stenosis. Scattered calcifications in the infrarenal aorta. Celiac: Patent without evidence of aneurysm, dissection, vasculitis or significant stenosis. SMA: Patent without evidence of aneurysm, dissection, vasculitis or significant stenosis. Renals: Both renal arteries are patent without evidence of aneurysm, dissection, vasculitis, fibromuscular dysplasia or significant stenosis. IMA: Patent without evidence of aneurysm, dissection, vasculitis or significant stenosis. Inflow: Patent without evidence of aneurysm, dissection, vasculitis or significant stenosis. Veins: No obvious venous abnormality within the limitations of this arterial phase study. Review of the MIP images confirms the above findings. NON-VASCULAR Hepatobiliary: Prior cholecystectomy.  No focal hepatic abnormality. Pancreas: No focal abnormality or ductal dilatation.  Spleen: No focal abnormality.  Normal size. Adrenals/Urinary Tract: No adrenal abnormality. No focal renal abnormality. No stones or hydronephrosis. Urinary bladder is unremarkable. Stomach/Bowel: Normal appendix. Stomach, large and small bowel grossly  unremarkable. Lymphatic: No adenopathy Reproductive: Prostate enlargement. Brachy therapy seeds in the region of the prostate. Other: No free fluid or free air. Musculoskeletal: No acute bony abnormality. Review of the MIP images confirms the above findings. IMPRESSION: No evidence of aortic aneurysm or dissection. Atherosclerotic calcifications in the infrarenal aorta. No acute cardiopulmonary disease. No acute findings in the abdomen or pelvis. Electronically Signed   By: Rolm Baptise M.D.   On: 07/02/2019 17:32   Disposition   Pt is being discharged home today in good condition.  Follow-up Plans & Appointments    Follow-up Information    [facility] Follow up on 07/31/2019.   Specialty: Cardiology Why: Please go to [facility] office for blood work  Contact information: [place] [phone]       Donato Heinz, MD Follow up on 07/26/2019.   Specialty: Cardiology Why: Please go to hospiral follow-up March 5th at 11:00 AM Contact information: [address] [place] [address] [phone]            Discharge Medications   Allergies as of 07/03/2019      Reactions   Actos [pioglitazone] Shortness Of Breath   Caused patient to be in CHF   Aricept [donepezil Hcl] Diarrhea      Medication List    TAKE these medications   Accu-Chek Softclix Lancets lancets USE TO CHECK BLOOD-SUGAR UP TO 4 TIMES DAILY (DX CODE E11.65)   albuterol 1.25 MG/3ML nebulizer solution Commonly known as: ACCUNEB Take 1 ampule by nebulization every 6 (six) hours as needed for wheezing.   Ventolin HFA 108 (90 Base) MCG/ACT inhaler Generic drug: albuterol INHALE 2 PUFFS EVERY 6 HOURS AS NEEDED   Anoro Ellipta 62.5-25 MCG/INH Aepb Generic drug: umeclidinium-vilanterol INHALE 1 PUFF INTO THE LUNGS DAILY.   bisoprolol 5 MG tablet Commonly known as: ZEBETA TAKE 1 TABLET TWICE DAILY   blood glucose meter  kit and supplies Kit Dispense based on patient and insurance preference. Use up to four times daily as directed. Dx Code: E11.65   citalopram 40 MG tablet Commonly known as: CELEXA TAKE 1 TABLET (40 MG TOTAL) DAILY.   clopidogrel 75 MG tablet Commonly known as: PLAVIX TAKE 1 TABLET EVERY DAY   cyclobenzaprine 10 MG tablet Commonly known as: FLEXERIL TAKE 1 TABLET THREE TIMES DAILY AS NEEDED FOR MUSCLE SPASMS   docusate sodium 100 MG capsule Commonly known as: COLACE Take 100 mg by mouth daily as needed for mild constipation.   fenofibrate 160 MG tablet Take 160 mg by mouth daily.   finasteride 5 MG tablet Commonly known as: PROSCAR Take 5 mg by mouth daily.   fluticasone 50 MCG/ACT nasal spray Commonly known as: FLONASE USE 2 SPRAYS IN EACH NOSTRIL ONE TIME DAILY AS NEEDED FOR ALLERGIES   furosemide 40 MG tablet Commonly known as: LASIX TAKE 1/2 TABLET TWICE DAILY (MUST MAKE APPOINTMENT)   gabapentin 600 MG tablet Commonly known as: NEURONTIN Take 2 tablets (1,200 mg total) by mouth 3 (three) times daily.   glimepiride 2 MG tablet Commonly known as: AMARYL TAKE 1 TABLET (2 MG TOTAL) DAILY.   glucose blood test strip Commonly known as: Accu-Chek Aviva Plus USE TO CHECK BLOOD-SUGAR UP TO 4 TIMES DAILY (DX CODE E11.65)  insulin detemir 100 UNIT/ML injection Commonly known as: LEVEMIR Inject 0.6 mLs (60 Units total) into the skin at bedtime. What changed: how much to take   Levemir FlexTouch 100 UNIT/ML Pen Generic drug: Insulin Detemir INJECT  60 UNITS SUBCUTANEOUSLY AT BEDTIME What changed: Another medication with the same name was changed. Make sure you understand how and when to take each.   irbesartan 300 MG tablet Commonly known as: AVAPRO TAKE 1 TABLET EVERY DAY   L-Lysine 1000 MG Tabs Take 1,000 mg by mouth daily as needed (for fever blisters).   liraglutide 18 MG/3ML Sopn Commonly known as: VICTOZA Inject 0.3 mLs (1.8 mg total) into the skin  daily.   LORazepam 1 MG tablet Commonly known as: ATIVAN TAKE 1 TABLET EVERY 8 HOURS AS NEEDED FOR ANXIETY   Lyrica 75 MG capsule Generic drug: pregabalin TAKE 1 CAPSULE TWICE DAILY   magnesium (amino acid chelate) 133 MG tablet Take 1 tablet by mouth 2 (two) times daily.   memantine 10 MG tablet Commonly known as: NAMENDA TAKE 1 TABLET TWICE DAILY   NovoLOG FlexPen 100 UNIT/ML FlexPen Generic drug: insulin aspart INJECT 20 TO 30 UNITS SUBCUTANEOUSLY FOUR TIMES DAILY   ondansetron 4 MG disintegrating tablet Commonly known as: Zofran ODT Take 1 tablet (4 mg total) by mouth every 8 (eight) hours as needed for nausea or vomiting.   OXYGEN 2lpm with sleep and occ as needed during the day [employer]   pantoprazole 40 MG tablet Commonly known as: PROTONIX TAKE 1 TABLET EVERY DAY   Potassium Gluconate 595 MG Caps Take 2 capsules by mouth daily.   promethazine 25 MG tablet Commonly known as: PHENERGAN Take 1 tablet (25 mg total) by mouth every 6 (six) hours as needed for nausea or vomiting.   sucralfate 1 g tablet Commonly known as: CARAFATE Take 1 g by mouth 4 (four) times daily -  with meals and at bedtime.   traMADol 50 MG tablet Commonly known as: ULTRAM TAKE 1 TO 2 TABLETS BY MOUTH EVERY 8 HOURS AS NEEDED FOR MODERATE PAIN          Outstanding Labs/Studies   LFTs in 1 month  Duration of Discharge Encounter   Greater than 30 minutes including physician time.  Signed, Buffie Herne Ninfa Meeker, PA-C 07/03/2019, 4:12 PM

## 2019-07-03 NOTE — Progress Notes (Signed)
RT set up patient's home cpap and placed on bedside table. Patient placed mask on himself and is tolerating CPAP well at this time. RT will monitor as needed.

## 2019-07-03 NOTE — Progress Notes (Signed)
  Echocardiogram 2D Echocardiogram has been performed.  Jennette Dubin 07/03/2019, 10:32 AM

## 2019-07-04 LAB — HEMOGLOBIN A1C
Hgb A1c MFr Bld: 9 % — ABNORMAL HIGH (ref 4.8–5.6)
Mean Plasma Glucose: 212 mg/dL

## 2019-07-17 ENCOUNTER — Ambulatory Visit: Payer: Medicare HMO | Admitting: Physician Assistant

## 2019-07-22 ENCOUNTER — Encounter (HOSPITAL_BASED_OUTPATIENT_CLINIC_OR_DEPARTMENT_OTHER): Payer: Self-pay

## 2019-07-22 ENCOUNTER — Other Ambulatory Visit: Payer: Self-pay

## 2019-07-22 ENCOUNTER — Inpatient Hospital Stay (HOSPITAL_BASED_OUTPATIENT_CLINIC_OR_DEPARTMENT_OTHER)
Admission: EM | Admit: 2019-07-22 | Discharge: 2019-07-27 | DRG: 177 | Disposition: A | Discharge status: Home Health | Payer: Medicare HMO | Attending: Internal Medicine | Admitting: Internal Medicine

## 2019-07-22 ENCOUNTER — Emergency Department (HOSPITAL_BASED_OUTPATIENT_CLINIC_OR_DEPARTMENT_OTHER): Payer: Medicare HMO

## 2019-07-22 DIAGNOSIS — E876 Hypokalemia: Secondary | ICD-10-CM | POA: Diagnosis present

## 2019-07-22 DIAGNOSIS — I110 Hypertensive heart disease with heart failure: Secondary | ICD-10-CM | POA: Diagnosis present

## 2019-07-22 DIAGNOSIS — K7581 Nonalcoholic steatohepatitis (NASH): Secondary | ICD-10-CM | POA: Diagnosis present

## 2019-07-22 DIAGNOSIS — E119 Type 2 diabetes mellitus without complications: Secondary | ICD-10-CM | POA: Diagnosis present

## 2019-07-22 DIAGNOSIS — J1282 Pneumonia due to coronavirus disease 2019: Secondary | ICD-10-CM | POA: Diagnosis present

## 2019-07-22 DIAGNOSIS — J440 Chronic obstructive pulmonary disease with acute lower respiratory infection: Secondary | ICD-10-CM | POA: Diagnosis present

## 2019-07-22 DIAGNOSIS — Z8673 Personal history of transient ischemic attack (TIA), and cerebral infarction without residual deficits: Secondary | ICD-10-CM | POA: Diagnosis not present

## 2019-07-22 DIAGNOSIS — A0839 Other viral enteritis: Secondary | ICD-10-CM | POA: Diagnosis present

## 2019-07-22 DIAGNOSIS — Z6838 Body mass index (BMI) 38.0-38.9, adult: Secondary | ICD-10-CM

## 2019-07-22 DIAGNOSIS — J9601 Acute respiratory failure with hypoxia: Secondary | ICD-10-CM

## 2019-07-22 DIAGNOSIS — Z79899 Other long term (current) drug therapy: Secondary | ICD-10-CM

## 2019-07-22 DIAGNOSIS — M797 Fibromyalgia: Secondary | ICD-10-CM | POA: Diagnosis present

## 2019-07-22 DIAGNOSIS — T380X5A Adverse effect of glucocorticoids and synthetic analogues, initial encounter: Secondary | ICD-10-CM | POA: Diagnosis present

## 2019-07-22 DIAGNOSIS — U071 COVID-19: Principal | ICD-10-CM | POA: Diagnosis present

## 2019-07-22 DIAGNOSIS — K7460 Unspecified cirrhosis of liver: Secondary | ICD-10-CM | POA: Diagnosis present

## 2019-07-22 DIAGNOSIS — Z9981 Dependence on supplemental oxygen: Secondary | ICD-10-CM

## 2019-07-22 DIAGNOSIS — E785 Hyperlipidemia, unspecified: Secondary | ICD-10-CM | POA: Diagnosis present

## 2019-07-22 DIAGNOSIS — J449 Chronic obstructive pulmonary disease, unspecified: Secondary | ICD-10-CM | POA: Diagnosis present

## 2019-07-22 DIAGNOSIS — I10 Essential (primary) hypertension: Secondary | ICD-10-CM | POA: Diagnosis not present

## 2019-07-22 DIAGNOSIS — E1140 Type 2 diabetes mellitus with diabetic neuropathy, unspecified: Secondary | ICD-10-CM | POA: Diagnosis present

## 2019-07-22 DIAGNOSIS — Z7902 Long term (current) use of antithrombotics/antiplatelets: Secondary | ICD-10-CM | POA: Diagnosis not present

## 2019-07-22 DIAGNOSIS — F419 Anxiety disorder, unspecified: Secondary | ICD-10-CM | POA: Diagnosis present

## 2019-07-22 DIAGNOSIS — F329 Major depressive disorder, single episode, unspecified: Secondary | ICD-10-CM | POA: Diagnosis present

## 2019-07-22 DIAGNOSIS — Z833 Family history of diabetes mellitus: Secondary | ICD-10-CM | POA: Diagnosis not present

## 2019-07-22 DIAGNOSIS — I252 Old myocardial infarction: Secondary | ICD-10-CM | POA: Diagnosis not present

## 2019-07-22 DIAGNOSIS — E1165 Type 2 diabetes mellitus with hyperglycemia: Secondary | ICD-10-CM | POA: Diagnosis present

## 2019-07-22 DIAGNOSIS — Z794 Long term (current) use of insulin: Secondary | ICD-10-CM | POA: Diagnosis not present

## 2019-07-22 DIAGNOSIS — K219 Gastro-esophageal reflux disease without esophagitis: Secondary | ICD-10-CM | POA: Diagnosis present

## 2019-07-22 DIAGNOSIS — I2510 Atherosclerotic heart disease of native coronary artery without angina pectoris: Secondary | ICD-10-CM | POA: Diagnosis present

## 2019-07-22 DIAGNOSIS — I5032 Chronic diastolic (congestive) heart failure: Secondary | ICD-10-CM | POA: Diagnosis present

## 2019-07-22 DIAGNOSIS — F0390 Unspecified dementia without behavioral disturbance: Secondary | ICD-10-CM | POA: Diagnosis present

## 2019-07-22 LAB — URINALYSIS, MICROSCOPIC (REFLEX)
Bacteria, UA: NONE SEEN
RBC / HPF: NONE SEEN RBC/hpf (ref 0–5)
WBC, UA: NONE SEEN WBC/hpf (ref 0–5)

## 2019-07-22 LAB — TROPONIN I (HIGH SENSITIVITY)
Troponin I (High Sensitivity): 4 ng/L (ref <18)
Troponin I (High Sensitivity): 4 ng/L (ref <18)

## 2019-07-22 LAB — CBC WITH DIFFERENTIAL/PLATELET
Abs Immature Granulocytes: 0.02 10*3/uL (ref 0.00–0.07)
Basophils Absolute: 0 10*3/uL (ref 0.0–0.1)
Basophils Relative: 0 %
Eosinophils Absolute: 0 10*3/uL (ref 0.0–0.5)
Eosinophils Relative: 0 %
HCT: 41.6 % (ref 39.0–52.0)
Hemoglobin: 13.6 g/dL (ref 13.0–17.0)
Immature Granulocytes: 0 %
Lymphocytes Relative: 23 %
Lymphs Abs: 1.4 10*3/uL (ref 0.7–4.0)
MCH: 29.5 pg (ref 26.0–34.0)
MCHC: 32.7 g/dL (ref 30.0–36.0)
MCV: 90.2 fL (ref 80.0–100.0)
Monocytes Absolute: 0.4 10*3/uL (ref 0.1–1.0)
Monocytes Relative: 6 %
Neutro Abs: 4.3 10*3/uL (ref 1.7–7.7)
Neutrophils Relative %: 71 %
Platelets: 163 10*3/uL (ref 150–400)
RBC: 4.61 MIL/uL (ref 4.22–5.81)
RDW: 13 % (ref 11.5–15.5)
WBC: 6.1 10*3/uL (ref 4.0–10.5)
nRBC: 0 % (ref 0.0–0.2)

## 2019-07-22 LAB — COMPREHENSIVE METABOLIC PANEL
ALT: 14 U/L (ref 0–44)
AST: 17 U/L (ref 15–41)
Albumin: 3.7 g/dL (ref 3.5–5.0)
Alkaline Phosphatase: 29 U/L — ABNORMAL LOW (ref 38–126)
Anion gap: 11 (ref 5–15)
BUN: 17 mg/dL (ref 6–20)
CO2: 26 mmol/L (ref 22–32)
Calcium: 9 mg/dL (ref 8.9–10.3)
Chloride: 101 mmol/L (ref 98–111)
Creatinine, Ser: 0.86 mg/dL (ref 0.61–1.24)
GFR calc Af Amer: >60 mL/min (ref >60)
GFR calc non Af Amer: >60 mL/min (ref >60)
Glucose, Bld: 106 mg/dL — ABNORMAL HIGH (ref 70–99)
Potassium: 2.8 mmol/L — ABNORMAL LOW (ref 3.5–5.1)
Sodium: 138 mmol/L (ref 135–145)
Total Bilirubin: 0.4 mg/dL (ref 0.3–1.2)
Total Protein: 6.8 g/dL (ref 6.5–8.1)

## 2019-07-22 LAB — C-REACTIVE PROTEIN: CRP: 3 mg/dL — ABNORMAL HIGH (ref <1.0)

## 2019-07-22 LAB — URINALYSIS, ROUTINE W REFLEX MICROSCOPIC
Bilirubin Urine: NEGATIVE
Glucose, UA: >=500 mg/dL — AB
Hgb urine dipstick: NEGATIVE
Ketones, ur: NEGATIVE mg/dL
Leukocytes,Ua: NEGATIVE
Nitrite: NEGATIVE
Protein, ur: NEGATIVE mg/dL
Specific Gravity, Urine: 1.015 (ref 1.005–1.030)
pH: 5.5 (ref 5.0–8.0)

## 2019-07-22 LAB — GLUCOSE, CAPILLARY: Glucose-Capillary: 119 mg/dL — ABNORMAL HIGH (ref 70–99)

## 2019-07-22 LAB — PROTIME-INR
INR: 1 (ref 0.8–1.2)
Prothrombin Time: 12.9 seconds (ref 11.4–15.2)

## 2019-07-22 LAB — TRIGLYCERIDES: Triglycerides: 413 mg/dL — ABNORMAL HIGH (ref <150)

## 2019-07-22 LAB — LACTIC ACID, PLASMA
Lactic Acid, Venous: 1.1 mmol/L (ref 0.5–1.9)
Lactic Acid, Venous: 1.3 mmol/L (ref 0.5–1.9)

## 2019-07-22 LAB — PROCALCITONIN: Procalcitonin: <0.1 ng/mL

## 2019-07-22 LAB — FERRITIN: Ferritin: 100 ng/mL (ref 24–336)

## 2019-07-22 LAB — D-DIMER, QUANTITATIVE: D-Dimer, Quant: 0.31 ug/mL-FEU (ref 0.00–0.50)

## 2019-07-22 LAB — BRAIN NATRIURETIC PEPTIDE: B Natriuretic Peptide: 35.3 pg/mL (ref 0.0–100.0)

## 2019-07-22 LAB — LACTATE DEHYDROGENASE: LDH: 145 U/L (ref 98–192)

## 2019-07-22 LAB — SARS CORONAVIRUS 2 AG (30 MIN TAT): SARS Coronavirus 2 Ag: POSITIVE — AB

## 2019-07-22 LAB — HEPATITIS B SURFACE ANTIGEN: Hepatitis B Surface Ag: NONREACTIVE

## 2019-07-22 LAB — FIBRINOGEN: Fibrinogen: 605 mg/dL — ABNORMAL HIGH (ref 210–475)

## 2019-07-22 MED ORDER — ALBUTEROL SULFATE HFA 108 (90 BASE) MCG/ACT IN AERS
1.0000 | INHALATION_SPRAY | Freq: Four times a day (QID) | RESPIRATORY_TRACT | Status: DC
  Administered 2019-07-22: 1 via RESPIRATORY_TRACT
  Administered 2019-07-22: 4 via RESPIRATORY_TRACT
  Filled 2019-07-22: qty 6.7

## 2019-07-22 MED ORDER — ENOXAPARIN SODIUM 40 MG/0.4ML ~~LOC~~ SOLN
40.0000 mg | Freq: Every day | SUBCUTANEOUS | Status: DC
  Administered 2019-07-23: 40 mg via SUBCUTANEOUS
  Filled 2019-07-22: qty 0.4

## 2019-07-22 MED ORDER — ACETAMINOPHEN 325 MG PO TABS
650.0000 mg | ORAL_TABLET | Freq: Four times a day (QID) | ORAL | Status: DC | PRN
  Administered 2019-07-26: 650 mg via ORAL
  Filled 2019-07-22: qty 2

## 2019-07-22 MED ORDER — ASCORBIC ACID 500 MG PO TABS
500.0000 mg | ORAL_TABLET | Freq: Every day | ORAL | Status: DC
  Administered 2019-07-23 – 2019-07-27 (×5): 500 mg via ORAL
  Filled 2019-07-22 (×5): qty 1

## 2019-07-22 MED ORDER — DEXAMETHASONE SODIUM PHOSPHATE 10 MG/ML IJ SOLN
6.0000 mg | Freq: Every day | INTRAMUSCULAR | Status: DC
  Administered 2019-07-23 – 2019-07-27 (×6): 6 mg via INTRAVENOUS
  Filled 2019-07-22 (×6): qty 1

## 2019-07-22 MED ORDER — GUAIFENESIN-DM 100-10 MG/5ML PO SYRP: 10.0000 mL | ORAL_SOLUTION | ORAL | Status: DC | PRN

## 2019-07-22 MED ORDER — ZINC SULFATE 220 (50 ZN) MG PO CAPS
220.0000 mg | ORAL_CAPSULE | Freq: Every day | ORAL | Status: DC
  Administered 2019-07-23 – 2019-07-27 (×5): 220 mg via ORAL
  Filled 2019-07-22 (×5): qty 1

## 2019-07-22 MED ORDER — SODIUM CHLORIDE 0.9 % IV SOLN
100.0000 mg | Freq: Every day | INTRAVENOUS | Status: AC
  Administered 2019-07-24 – 2019-07-27 (×4): 100 mg via INTRAVENOUS
  Filled 2019-07-22 (×4): qty 20

## 2019-07-22 MED ORDER — SODIUM CHLORIDE 0.9 % IV SOLN: 100.0000 mg | Freq: Every day | INTRAVENOUS | Status: DC

## 2019-07-22 MED ORDER — INSULIN ASPART 100 UNIT/ML ~~LOC~~ SOLN
0.0000 [IU] | Freq: Three times a day (TID) | SUBCUTANEOUS | Status: DC
  Administered 2019-07-23 (×2): 5 [IU] via SUBCUTANEOUS
  Administered 2019-07-23: 8 [IU] via SUBCUTANEOUS
  Administered 2019-07-24: 5 [IU] via SUBCUTANEOUS

## 2019-07-22 MED ORDER — IPRATROPIUM-ALBUTEROL 20-100 MCG/ACT IN AERS
1.0000 | INHALATION_SPRAY | Freq: Four times a day (QID) | RESPIRATORY_TRACT | Status: DC
  Administered 2019-07-23 (×3): 1 via RESPIRATORY_TRACT
  Filled 2019-07-22: qty 4

## 2019-07-22 MED ORDER — SODIUM CHLORIDE 0.9 % IV SOLN: 200.0000 mg | Freq: Once | INTRAVENOUS | Status: DC

## 2019-07-22 MED ORDER — POTASSIUM CHLORIDE CRYS ER 20 MEQ PO TBCR
40.0000 meq | EXTENDED_RELEASE_TABLET | Freq: Once | ORAL | Status: AC
  Administered 2019-07-23: 40 meq via ORAL
  Filled 2019-07-22: qty 2

## 2019-07-22 MED ORDER — POTASSIUM CHLORIDE CRYS ER 20 MEQ PO TBCR
40.0000 meq | EXTENDED_RELEASE_TABLET | Freq: Once | ORAL | Status: AC
  Administered 2019-07-22: 40 meq via ORAL
  Filled 2019-07-22: qty 2

## 2019-07-22 MED ORDER — HYDROCOD POLST-CPM POLST ER 10-8 MG/5ML PO SUER
5.0000 mL | Freq: Two times a day (BID) | ORAL | Status: DC | PRN
  Administered 2019-07-23 – 2019-07-26 (×8): 5 mL via ORAL
  Filled 2019-07-22 (×9): qty 5

## 2019-07-22 MED ORDER — VITAMIN D 25 MCG (1000 UNIT) PO TABS
1000.0000 [IU] | ORAL_TABLET | Freq: Every day | ORAL | Status: DC
  Administered 2019-07-23 – 2019-07-27 (×5): 1000 [IU] via ORAL
  Filled 2019-07-22 (×5): qty 1

## 2019-07-22 MED ORDER — SODIUM CHLORIDE 0.9 % IV SOLN
100.0000 mg | INTRAVENOUS | Status: AC
  Administered 2019-07-23 (×2): 100 mg via INTRAVENOUS
  Filled 2019-07-22 (×2): qty 20

## 2019-07-22 NOTE — H&P (Signed)
History and Physical    Bryan Becker ITG:[id number] DOB: 09-08-1960 DOA: 07/22/2019  PCP: Robert Bellow, PA-C Patient coming from: [facility] ED  Chief Complaint: Shortness of breath  HPI: Bryan Becker is a 59 y.o. male with medical history significant of early-onset dementia, asthma, COPD, chronic bronchitis, diastolic CHF, NASH, hypertension, hyperlipidemia, OSA, CVA, type 2 diabetes presenting to the ED with complaints of shortness of breath.  Patient states a week ago he was having ear ache and sore throat.  He went to an urgent care center and was diagnosed with COVID-19.  Since then he has had progressively worsening dyspnea, chest tightness, and a dry cough.  Also having body aches.  No fevers, nausea, vomiting, abdominal pain, diarrhea.  No other complaints.  ED Course: Afebrile.  Slightly tachycardic.  Oxygen saturation in the low 90s on room air and became very dyspneic in the ED with minimal ambulation.  SARS-CoV-2 rapid antigen test positive.  CBC unremarkable.  Lactic acid normal x2.  Potassium 2.8.  LFTs not elevated.  Hep B surface antigen nonreactive.  BNP normal.  D-dimer normal.  Procalcitonin <0.10.  Inflammatory markers elevated-triglycerides 413, fibrinogen 605, and CRP 3.0.  High-sensitivity troponin negative x2.  UA not suggestive of infection.  Blood culture x2 pending. Chest x-ray showing low lung volumes with bibasilar atelectasis and no airspace consolidation. Patient received an albuterol inhaler treatment and potassium supplementation.  Review of Systems:  All systems reviewed and apart from history of presenting illness, are negative.  Past Medical History:  Diagnosis Date  . Anxiety   . Arthritis    "joints"  . Asthma   . CHF (congestive heart failure) ([place])   . Chronic back pain    "mostly lower; have 2 bulging discs in my neck" (12/05/2013)  . Chronic bronchitis ([place])    "get it q yr"  . 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  Confusion   . COPD (chronic obstructive pulmonary disease) ([place])   . Dementia ([place])    onset of early  . Dementia ([place])   . Depression   . Fall at home 06/04/14   Pt stated he falls off the bed at nightly  . Family history of adverse reaction to anesthesia    Patients mother had a bad reaction to Anesthesia however pt is unaware of reaction  . Fibromyalgia   . Frequent urination   . Gastric ulcer   . GERD (gastroesophageal reflux disease)   . AXENMMHW(808.8)    "weekly" (12/04/2013)  . Hyperlipidemia   . Hypertension   . Kidney stone   . Myocardial infarction [facility])    unsure about MI history, but had normal cath that admission '07 ([facility])  . Nausea & vomiting   . OSA (obstructive sleep apnea) 04/24/2013   "use BiPAP"  . Pancreatitis   . Pneumonia    "several times"  . [facility] spotted fever) 11/22/2012  . Seasonal allergies   . Shortness of breath dyspnea    with ambulation  . Stroke [facility]) 03/2013   "still weak on the left side" (12/05/2013)  . Stroke [facility]) 12/04/2013   "came in today w/right sided weakness" (12/04/2013)  . Type II diabetes mellitus ([place])     Past Surgical History:  Procedure Laterality Date  . CARDIAC CATHETERIZATION     10/28/05: right dominant, normal LV systolic function, normal coronaries. (Dr. Baxter Hire, [facility])  . CHOLECYSTECTOMY    . COLONOSCOPY    .  COLONOSCOPY WITH PROPOFOL N/A 07/08/2016   Procedure: COLONOSCOPY WITH PROPOFOL;  Surgeon: Gatha Mayer, MD;  Location: [facility] ENDOSCOPY;  Service: Endoscopy;  Laterality: N/A;  . CYSTOSCOPY W/ STONE MANIPULATION    . LUMBAR LAMINECTOMY/DECOMPRESSION MICRODISCECTOMY Left 06/18/2014   Procedure: LUMBAR LAMINECTOMY/DECOMPRESSION MICRODISCECTOMY LEFT LUMBAR FIVE-SACRAL ONE;  Surgeon: Ashok Pall, MD;  Location: [place] NEURO ORS;  Service: Neurosurgery;  Laterality: Left;  left  . multiple cysts removal-hip,wrist    . TONSILLECTOMY  ~ 1985  . UPPER GASTROINTESTINAL ENDOSCOPY       reports that he quit  smoking about 17 years ago. His smoking use included cigarettes. He has a 42.00 pack-year smoking history. He has never used smokeless tobacco. He reports that he does not drink alcohol or use drugs.  Allergies  Allergen Reactions  . Actos [Pioglitazone] Shortness Of Breath    Caused patient to be in CHF  . Aricept [Donepezil Hcl] Diarrhea    Family History  Problem Relation Age of Onset  . Skin cancer Father   . Heart attack Father   . Stomach cancer Father   . Hypertension Father   . Aneurysm Father   . Lung cancer Paternal Grandfather   . Heart disease Paternal Grandfather   . Stomach cancer Maternal Grandfather   . Bone cancer Paternal Grandmother   . Heart attack Paternal Grandmother   . Diabetes Mother   . Hypertension Mother   . Arthritis Mother   . Other Mother        brain stem vertigo/disorder of pancreas  . Diabetes Maternal Grandmother   . Heart disease Maternal Grandmother   . Aneurysm Maternal Uncle   . Aneurysm Maternal Uncle   . Colon cancer Neg Hx   . Esophageal cancer Neg Hx   . Rectal cancer Neg Hx     Prior to Admission medications   Medication Sig Start Date End Date Taking? Authorizing Provider  ACCU-CHEK SOFTCLIX LANCETS lancets USE TO CHECK BLOOD-SUGAR UP TO 4 TIMES DAILY (DX CODE E11.65) 10/27/16   Golden Circle, FNP  albuterol (ACCUNEB) 1.25 MG/3ML nebulizer solution Take 1 ampule by nebulization every 6 (six) hours as needed for wheezing.    [provider]  ANORO ELLIPTA 62.5-25 MCG/INH AEPB INHALE 1 PUFF INTO THE LUNGS DAILY. 02/09/17   Tanda Rockers, MD  bisoprolol (ZEBETA) 5 MG tablet TAKE 1 TABLET TWICE DAILY 02/09/17   Tanda Rockers, MD  blood glucose meter kit and supplies KIT Dispense based on patient and insurance preference. Use up to four times daily as directed. Dx Code: E11.65 09/19/16   Golden Circle, FNP  citalopram (CELEXA) 40 MG tablet TAKE 1 TABLET (40 MG TOTAL) DAILY. 01/31/17   Golden Circle, FNP  clopidogrel  (PLAVIX) 75 MG tablet TAKE 1 TABLET EVERY DAY 02/09/17   Golden Circle, FNP  cyclobenzaprine (FLEXERIL) 10 MG tablet TAKE 1 TABLET THREE TIMES DAILY AS NEEDED FOR MUSCLE SPASMS 03/14/17   Lance Sell, NP  docusate sodium (COLACE) 100 MG capsule Take 100 mg by mouth daily as needed for mild constipation.    [provider]  fenofibrate 160 MG tablet Take 160 mg by mouth daily. 06/22/16   [provider]  finasteride (PROSCAR) 5 MG tablet Take 5 mg by mouth daily.    [provider]  fluticasone (FLONASE) 50 MCG/ACT nasal spray USE 2 SPRAYS IN EACH NOSTRIL ONE TIME DAILY AS NEEDED FOR ALLERGIES 12/09/16   Tanda Rockers, MD  furosemide (LASIX) 40 MG tablet TAKE 1/2 TABLET TWICE DAILY (MUST MAKE APPOINTMENT) 01/15/19   Croitoru, Dani Gobble, MD  gabapentin (NEURONTIN) 600 MG tablet Take 2 tablets (1,200 mg total) by mouth 3 (three) times daily. 09/14/16   Golden Circle, FNP  glimepiride (AMARYL) 2 MG tablet TAKE 1 TABLET (2 MG TOTAL) DAILY. 02/09/17   Golden Circle, FNP  glucose blood (ACCU-CHEK AVIVA PLUS) test strip USE TO CHECK BLOOD-SUGAR UP TO 4 TIMES DAILY (DX CODE E11.65) 02/15/17   Golden Circle, FNP  insulin detemir (LEVEMIR) 100 UNIT/ML injection Inject 0.6 mLs (60 Units total) into the skin at bedtime. Patient taking differently: Inject 132 Units into the skin at bedtime.  09/09/16   Golden Circle, FNP  irbesartan (AVAPRO) 300 MG tablet TAKE 1 TABLET EVERY DAY 12/09/16   Tanda Rockers, MD  L-Lysine 1000 MG TABS Take 1,000 mg by mouth daily as needed (for fever blisters).    [provider]  LEVEMIR FLEXTOUCH 100 UNIT/ML Pen INJECT  60 UNITS SUBCUTANEOUSLY AT BEDTIME 02/09/17   Golden Circle, FNP  liraglutide 18 MG/3ML SOPN Inject 0.3 mLs (1.8 mg total) into the skin daily. 08/15/16   Golden Circle, FNP  LORazepam (ATIVAN) 1 MG tablet TAKE 1 TABLET EVERY 8 HOURS AS NEEDED FOR ANXIETY 02/01/17   Golden Circle, FNP  LYRICA 75 MG  capsule TAKE 1 CAPSULE TWICE DAILY 02/21/17   Golden Circle, FNP  memantine (NAMENDA) 10 MG tablet TAKE 1 TABLET TWICE DAILY 01/31/17   Golden Circle, FNP  NOVOLOG FLEXPEN 100 UNIT/ML FlexPen INJECT 20 TO 30 UNITS SUBCUTANEOUSLY FOUR TIMES DAILY 12/09/16   Golden Circle, FNP  ondansetron (ZOFRAN ODT) 4 MG disintegrating tablet Take 1 tablet (4 mg total) by mouth every 8 (eight) hours as needed for nausea or vomiting. 11/22/16   Golden Circle, FNP  OXYGEN 2lpm with sleep and occ as needed during the day [employer]    [provider]  pantoprazole (PROTONIX) 40 MG tablet TAKE 1 TABLET EVERY DAY 01/31/17   Golden Circle, FNP  Potassium Gluconate 595 MG CAPS Take 2 capsules by mouth daily.    [provider]  promethazine (PHENERGAN) 25 MG tablet Take 1 tablet (25 mg total) by mouth every 6 (six) hours as needed for nausea or vomiting. 02/11/14   Ward, Delice Bison, DO  Specialty Vitamins Products (MAGNESIUM, AMINO ACID CHELATE,) 133 MG tablet Take 1 tablet by mouth 2 (two) times daily.    [provider]  sucralfate (CARAFATE) 1 g tablet Take 1 g by mouth 4 (four) times daily -  with meals and at bedtime.    [provider]  traMADol (ULTRAM) 50 MG tablet TAKE 1 TO 2 TABLETS BY MOUTH EVERY 8 HOURS AS NEEDED FOR MODERATE PAIN 02/21/17   Golden Circle, FNP  VENTOLIN HFA 108 [phone] Base) MCG/ACT inhaler INHALE 2 PUFFS EVERY 6 HOURS AS NEEDED 12/16/16   Melvenia Needles, NP    Physical Exam: Vitals:   07/22/19 1530 07/22/19 1600 07/22/19 1848 07/22/19 2140  BP: 132/72 (!) 160/88 137/77 (!) 154/81  Pulse: (!) 114 (!) 117 (!) 105 (!) 113  Resp: 19 (!) 23 18 20   Temp:    98.9 F (37.2 C)  TempSrc:    Oral  SpO2: 94% 94% 94% 94%  Weight:      Height:        Physical Exam  Constitutional: He is oriented to  person, place, and time. He appears well-developed and well-nourished. No distress.  HENT:  Head: Normocephalic.  Eyes: Right eye exhibits no  discharge. Left eye exhibits no discharge.  Cardiovascular: Normal rate, regular rhythm and intact distal pulses.  Pulmonary/Chest: Effort normal. No respiratory distress. He has no wheezes. He has rales.  Bibasilar rales  Abdominal: Soft. Bowel sounds are normal. He exhibits no distension. There is no abdominal tenderness. There is no guarding.  Musculoskeletal:        General: No edema.     Cervical back: Neck supple.  Neurological: He is alert and oriented to person, place, and time.  Skin: Skin is warm and dry. He is not diaphoretic.     Labs on Admission: I have personally reviewed following labs and imaging studies  CBC: Recent Labs  Lab 07/22/19 1008  WBC 6.1  NEUTROABS 4.3  HGB 13.6  HCT 41.6  MCV 90.2  PLT 546   Basic Metabolic Panel: Recent Labs  Lab 07/22/19 1008  NA 138  K 2.8*  CL 101  CO2 26  GLUCOSE 106*  BUN 17  CREATININE 0.86  CALCIUM 9.0   GFR: Estimated Creatinine Clearance: 115.1 mL/min (by C-G formula based on SCr of 0.86 mg/dL). Liver Function Tests: Recent Labs  Lab 07/22/19 1008  AST 17  ALT 14  ALKPHOS 29*  BILITOT 0.4  PROT 6.8  ALBUMIN 3.7   No results for input(s): LIPASE, AMYLASE in the last 168 hours. No results for input(s): AMMONIA in the last 168 hours. Coagulation Profile: Recent Labs  Lab 07/22/19 1008  INR 1.0   Cardiac Enzymes: No results for input(s): CKTOTAL, CKMB, CKMBINDEX, TROPONINI in the last 168 hours. BNP (last 3 results) No results for input(s): PROBNP in the last 8760 hours. HbA1C: No results for input(s): HGBA1C in the last 72 hours. CBG: Recent Labs  Lab 07/22/19 2208  GLUCAP 119*   Lipid Profile: Recent Labs    07/22/19 1008  TRIG 413*   Thyroid Function Tests: No results for input(s): TSH, T4TOTAL, FREET4, T3FREE, THYROIDAB in the last 72 hours. Anemia Panel: Recent Labs    07/22/19 1023  FERRITIN 100   Urine analysis:    Component Value Date/Time   COLORURINE YELLOW  07/22/2019 1115   APPEARANCEUR CLEAR 07/22/2019 1115   LABSPEC 1.015 07/22/2019 1115   PHURINE 5.5 07/22/2019 1115   GLUCOSEU >=500 (A) 07/22/2019 1115   HGBUR NEGATIVE 07/22/2019 1115   BILIRUBINUR NEGATIVE 07/22/2019 1115   KETONESUR NEGATIVE 07/22/2019 1115   PROTEINUR NEGATIVE 07/22/2019 1115   UROBILINOGEN 1.0 04/29/2014 1820   NITRITE NEGATIVE 07/22/2019 1115   LEUKOCYTESUR NEGATIVE 07/22/2019 1115    Radiological Exams on Admission: DG Chest Portable 1 View  Result Date: 07/22/2019 CLINICAL DATA:  Shortness of breath.  COVID positive. EXAM: PORTABLE CHEST 1 VIEW COMPARISON:  07/02/2019 FINDINGS: Heart size is normal. No pleural effusion or edema. Decreased lung volumes with bibasilar atelectasis. No airspace consolidation. IMPRESSION: 1. Low lung volumes with bibasilar atelectasis Electronically Signed   By: Kerby Moors M.D.   On: 07/22/2019 10:08    EKG: Independently reviewed.  Sinus rhythm, no significant change since prior tracing.  Assessment/Plan Principal Problem:   COVID-19 virus infection Active Problems:   Essential hypertension   COPD GOLD  III   Acute respiratory failure with hypoxia (HCC)   Hypokalemia   Acute hypoxic respiratory failure secondary to suspected COVID-19 viral pneumonia: Oxygen saturation in the low 90s on room air and becomes very  dyspneic with minimal ambulation.  SARS-CoV-2 rapid antigen test positive.  Although chest x-ray not showing airspace consolidation, suspicion for viral pneumonia is very high.  No signs of sepsis.   Inflammatory markers elevated-triglycerides 413, fibrinogen 605, and CRP 3.0.  Bacterial pneumonia less likely given procalcitonin <0.10. Acute PE less likely as D-dimer is normal.  No signs of volume overload on chest x-ray and BNP normal. -Chest CT for further evaluation -Remdesivir dosing per pharmacy -IV Decadron 6 mg daily -Vitamin C, zinc, vitamin D -Antitussives as needed -Tylenol as needed -Daily CBC with  differential, CMP, CRP, D-dimer, LDH -Airborne and contact precautions -Flutter valve, incentive spirometry -Continuous pulse ox -Supplemental oxygen as needed to keep oxygen saturation above 90% -Blood culture x2 pending  Mild hypokalemia: Suspect related to home diuretic use. Plan is to replete potassium, check magnesium level and replete if low, and continue to monitor electrolytes.  COPD: Stable.  No wheezing.  Combivent inhaler q6 hrs.  Chronic diastolic congestive heart failure: No signs of volume overload.  Hold diuretic at this time.  Hypertension: Most recent systolic in the 193X.  Order hydralazine PRN SBP >170.  Insulin-dependent type 2 diabetes: Most recent CBG 119.  Order sliding scale insulin with meals and CBG checks.  Pharmacy med rec pending.  DVT prophylaxis: Lovenox Code Status: Full code Family Communication: No family available at this time. Disposition Plan: Anticipate patient will stay in the hospital for greater than 2 midnights given acute hypoxic respiratory failure secondary to suspected COVID-19 viral pneumonia. Admission status: It is my clinical opinion that admission to INPATIENT is reasonable and necessary because of the expectation that this patient will require hospital care that crosses at least 2 midnights to treat this condition based on the medical complexity of the problems presented.  Given the aforementioned information, the predictability of an adverse outcome is felt to be significant.  The medical decision making on this patient was of high complexity and the patient is at high risk for clinical deterioration, therefore this is a level 3 visit.  Shela Leff MD [facility]  If 7PM-7AM, please contact night-coverage [email or web address]  07/22/2019, 10:38 PM

## 2019-07-22 NOTE — ED Provider Notes (Signed)
[facility] Emergency Department Provider Note MRN:  [id number]  Arrival date & time: 07/22/19     Chief Complaint   Shortness of Breath   History of Present Illness   Bryan Becker is a 59 y.o. year-old male with a history of CHF, COPD, cirrhosis presenting to the ED with chief complaint of shortness of breath.  Patient explains that he was recently diagnosed with congestive heart failure and was admitted to the hospital.  Has been taking Lasix.  Soon after discharge she was diagnosed with COVID-19.  Began with nasal congestion and ear pain 6 days ago, has progressed to cough, dyspnea exertion.  Now having great difficulty ambulating around the house.  Denies fever, no chest pain, no abdominal pain, no leg pain or swelling.  Feels general malaise, fatigue, low energy.  Review of Systems  A complete 10 system review of systems was obtained and all systems are negative except as noted in the HPI and PMH.   Patient's Health History    Past Medical History:  Diagnosis Date  . Anxiety   . Arthritis    "joints"  . Asthma   . CHF (congestive heart failure) ([place])   . Chronic back pain    "mostly lower; have 2 bulging discs in my neck" (12/05/2013)  . Chronic bronchitis ([place])    "get it q yr"  . 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 Confusion   . COPD (chronic obstructive pulmonary disease) ([place])   . Dementia ([place])    onset of early  . Dementia ([place])   . Depression   . Fall at home 06/04/14   Pt stated he falls off the bed at nightly  . Family history of adverse reaction to anesthesia    Patients mother had a bad reaction to Anesthesia however pt is unaware of reaction  . Fibromyalgia   . Frequent urination   . Gastric ulcer   . GERD (gastroesophageal reflux disease)   . YKDXIPJA(250.5)    "weekly" (12/04/2013)  . Hyperlipidemia   . Hypertension   . Kidney stone   . Myocardial infarction [facility])    unsure about MI  history, but had normal cath that admission '07 ([facility])  . Nausea & vomiting   . OSA (obstructive sleep apnea) 04/24/2013   "use BiPAP"  . Pancreatitis   . Pneumonia    "several times"  . RMSF [facility] spotted fever) 11/22/2012  . Seasonal allergies   . Shortness of breath dyspnea    with ambulation  . Stroke [facility]) 03/2013   "still weak on the left side" (12/05/2013)  . Stroke [facility]) 12/04/2013   "came in today w/right sided weakness" (12/04/2013)  . Type II diabetes mellitus ([place])     Past Surgical History:  Procedure Laterality Date  . CARDIAC CATHETERIZATION     10/28/05: right dominant, normal LV systolic function, normal coronaries. (Dr. Baxter Hire, [facility])  . CHOLECYSTECTOMY    . COLONOSCOPY    . COLONOSCOPY WITH PROPOFOL N/A 07/08/2016   Procedure: COLONOSCOPY WITH PROPOFOL;  Surgeon: Gatha Mayer, MD;  Location: [facility] ENDOSCOPY;  Service: Endoscopy;  Laterality: N/A;  . CYSTOSCOPY W/ STONE MANIPULATION    . LUMBAR LAMINECTOMY/DECOMPRESSION MICRODISCECTOMY Left 06/18/2014   Procedure: LUMBAR LAMINECTOMY/DECOMPRESSION MICRODISCECTOMY LEFT LUMBAR FIVE-SACRAL ONE;  Surgeon: Ashok Pall, MD;  Location: [place] [facility]URO ORS;  Service: Neurosurgery;  Laterality: Left;  left  . multiple cysts removal-hip,wrist    . TONSILLECTOMY  ~  1985  . UPPER GASTROINTESTINAL ENDOSCOPY      Family History  Problem Relation Age of Onset  . Skin cancer Father   . Heart attack Father   . Stomach cancer Father   . Hypertension Father   . Aneurysm Father   . Lung cancer Paternal Grandfather   . Heart disease Paternal Grandfather   . Stomach cancer Maternal Grandfather   . Bone cancer Paternal Grandmother   . Heart attack Paternal Grandmother   . Diabetes Mother   . Hypertension Mother   . Arthritis Mother   . Other Mother        brain stem vertigo/disorder of pancreas  . Diabetes Maternal Grandmother   . Heart disease Maternal Grandmother   . Aneurysm Maternal Uncle   . Aneurysm Maternal Uncle   .  Colon cancer Neg Hx   . Esophageal cancer Neg Hx   . Rectal cancer Neg Hx     Social History   Socioeconomic History  . Marital status: Married    Spouse name: Not on file  . Number of children: 2  . Years of education: 51  . Highest education level: Not on file  Occupational History  . Occupation: Disability    Employer: [employer]  Tobacco Use  . Smoking status: Former Smoker    Packs/day: 2.00    Years: 21.00    Pack years: 42.00    Types: Cigarettes    Quit date: 05/23/2002    Years since quitting: 17.1  . Smokeless tobacco: Never Used  Substance and Sexual Activity  . Alcohol use: No  . Drug use: No  . Sexual activity: Not Currently  Other Topics Concern  . Not on file  Social History Narrative  . Not on file   Social Determinants of Health   Financial Resource Strain:   . Difficulty of Paying Living Expenses: Not on file  Food Insecurity:   . Worried About Charity fundraiser in the Last Year: Not on file  . Ran Out of Food in the Last Year: Not on file  Transportation Needs:   . Lack of Transportation (Medical): Not on file  . Lack of Transportation (Non-Medical): Not on file  Physical Activity:   . Days of Exercise per Week: Not on file  . Minutes of Exercise per Session: Not on file  Stress:   . Feeling of Stress : Not on file  Social Connections:   . Frequency of Communication with Friends and Family: Not on file  . Frequency of Social Gatherings with Friends and Family: Not on file  . Attends Religious Services: Not on file  . Active Member of Clubs or Organizations: Not on file  . Attends Archivist Meetings: Not on file  . Marital Status: Not on file  Intimate Partner Violence:   . Fear of Current or Ex-Partner: Not on file  . Emotionally Abused: Not on file  . Physically Abused: Not on file  . Sexually Abused: Not on file     Physical Exam   Vitals:   07/22/19 1030 07/22/19 1130  BP: 124/82 (!) 118/94  Pulse: 97 100   Resp: 14 (!) 28  Temp:    SpO2: 92% 92%    CONSTITUTIONAL: Well-appearing, NAD NEURO:  Alert and oriented x 3, no focal deficits EYES:  eyes equal and reactive ENT/NECK:  no LAD, no JVD CARDIO: Regular rate, well-perfused, normal S1 and S2 PULM:  CTAB no wheezing or rhonchi GI/GU:  normal bowel sounds, non-distended, non-tender MSK/SPINE:  No gross deformities, no edema SKIN:  no rash, atraumatic PSYCH:  Appropriate speech and behavior  *Additional and/or pertinent findings included in MDM below  Diagnostic and Interventional Summary    EKG Interpretation  Date/Time:  Monday July 22 2019 09:50:20 EST Ventricular Rate:  98 PR Interval:    QRS Duration: 99 QT Interval:  330 QTC Calculation: 422 R Axis:   46 Text Interpretation: Sinus rhythm Probable left atrial enlargement Borderline repolarization abnormality Confirmed by Gerlene Fee [phone]) on 07/22/2019 11:42:47 AM      Cardiac Monitoring Interpretation:  Labs Reviewed  SARS CORONAVIRUS 2 AG (30 MIN TAT) - Abnormal; Notable for the following components:      Result Value   SARS Coronavirus 2 Ag POSITIVE (*)    All other components within normal limits  COMPREHENSIVE METABOLIC PANEL - Abnormal; Notable for the following components:   Potassium 2.8 (*)    Glucose, Bld 106 (*)    Alkaline Phosphatase 29 (*)    All other components within normal limits  URINALYSIS, ROUTINE W REFLEX MICROSCOPIC - Abnormal; Notable for the following components:   Glucose, UA >=500 (*)    All other components within normal limits  TRIGLYCERIDES - Abnormal; Notable for the following components:   Triglycerides 413 (*)    All other components within normal limits  FIBRINOGEN - Abnormal; Notable for the following components:   Fibrinogen 605 (*)    All other components within normal limits  CULTURE, BLOOD (ROUTINE X 2)  CULTURE, BLOOD (ROUTINE X 2)  LACTIC ACID, PLASMA  CBC WITH DIFFERENTIAL/PLATELET  PROTIME-INR  BRAIN NATRIURETIC  PEPTIDE  D-DIMER, QUANTITATIVE (NOT AT [facility])  LACTATE DEHYDROGENASE  URINALYSIS, MICROSCOPIC (REFLEX)  LACTIC ACID, PLASMA  PROCALCITONIN  FERRITIN  C-REACTIVE PROTEIN  HEPATITIS B SURFACE ANTIGEN  TROPONIN I (HIGH SENSITIVITY)    DG Chest Portable 1 View  Final Result      Medications  albuterol (VENTOLIN HFA) 108 (90 Base) MCG/ACT inhaler 1-2 puff (has no administration in time range)  potassium chloride SA (KLOR-CON) CR tablet 40 mEq (40 mEq Oral Given 07/22/19 1152)     Procedures  /  Critical Care Procedures  ED Course and Medical Decision Making  I have reviewed the triage vital signs, the nursing notes, and pertinent available records from the EMR.  Pertinent labs & imaging results that were available during my care of the patient were reviewed by me and considered in my medical decision making (see below for details).     Generally well-appearing and in no acute distress with normal vital signs at rest but was extremely symptomatic with any minimal ambulation.  Only day 6 of Covid illness, suspect that he will worsen.  Currently on room air with saturations in the low 90s.  Will obtain labs, monitor, consider admission.  Patient's oxygen saturations downtrending from mid 90s to low 90s, hypokalemia on labs, admitted to hospital service for further care.  Barth Kirks. Sedonia Small, MD [place] [email or web address]  Final Clinical Impressions(s) / ED Diagnoses     ICD-10-CM   1. COVID-19  U07.1   2. Hypokalemia  E87.6     ED Discharge Orders    None       Discharge Instructions Discussed with and Provided to Patient:   Discharge Instructions   None       Maudie Flakes, MD 07/22/19 1318

## 2019-07-22 NOTE — Plan of Care (Signed)
Transfer from [facility] discussed with Dr. Sedonia Small. Mr. Quirk is a 59 year old male with pmh diagnosed HTN, HLD, dCHF last EF 65 to 70%, CAD, COPD, CVA, and OSA.  He presented with progressively worsening shortness of breath and sore throat.  COVID-19 screening positive.  O2 saturations otherwise maintained currently on room air.  Labs significant for potassium 2.8 and BNP 35.3.  Other inflammatory markers are still pending.  Chest x-ray showing low lung volumes with bibasilar atelectasis.  Patient was given 40 mEq of potassium.  Blood cultures were obtained and patient was accepted to a telemetry bed here at [facility].

## 2019-07-22 NOTE — ED Notes (Signed)
Pt given crackers and drink

## 2019-07-22 NOTE — ED Triage Notes (Signed)
Pt arrives to ED with c/o increasing SOB and cough states that he was dx Covid positive on last Tuesday. Pt has O2 machine at home states it has been around 85% and 90 % at home so he has been wearing 2L [place] sometimes at home. Pt attempted to walk to room and started to stumble, given a wheelchair. Pt reports taking tylenol at home around 730 this morning.

## 2019-07-22 NOTE — ED Notes (Signed)
Pt c/o temp in room being too high, explained to pt importance of keeping door closed due to being COVID-19. Also complains of low CGB and is requesting food, currently @ 82 per pt's machine.

## 2019-07-23 ENCOUNTER — Inpatient Hospital Stay (HOSPITAL_COMMUNITY): Payer: Medicare HMO

## 2019-07-23 DIAGNOSIS — E876 Hypokalemia: Secondary | ICD-10-CM

## 2019-07-23 DIAGNOSIS — I10 Essential (primary) hypertension: Secondary | ICD-10-CM

## 2019-07-23 LAB — CBC WITH DIFFERENTIAL/PLATELET
Abs Immature Granulocytes: 0.02 10*3/uL (ref 0.00–0.07)
Basophils Absolute: 0 10*3/uL (ref 0.0–0.1)
Basophils Relative: 0 %
Eosinophils Absolute: 0 10*3/uL (ref 0.0–0.5)
Eosinophils Relative: 0 %
HCT: 42.2 % (ref 39.0–52.0)
Hemoglobin: 14 g/dL (ref 13.0–17.0)
Immature Granulocytes: 0 %
Lymphocytes Relative: 22 %
Lymphs Abs: 1.4 10*3/uL (ref 0.7–4.0)
MCH: 29.9 pg (ref 26.0–34.0)
MCHC: 33.2 g/dL (ref 30.0–36.0)
MCV: 90 fL (ref 80.0–100.0)
Monocytes Absolute: 0.3 10*3/uL (ref 0.1–1.0)
Monocytes Relative: 5 %
Neutro Abs: 4.7 10*3/uL (ref 1.7–7.7)
Neutrophils Relative %: 73 %
Platelets: 171 10*3/uL (ref 150–400)
RBC: 4.69 MIL/uL (ref 4.22–5.81)
RDW: 13.1 % (ref 11.5–15.5)
WBC: 6.5 10*3/uL (ref 4.0–10.5)
nRBC: 0 % (ref 0.0–0.2)

## 2019-07-23 LAB — COMPREHENSIVE METABOLIC PANEL
ALT: 14 U/L (ref 0–44)
AST: 19 U/L (ref 15–41)
Albumin: 3.6 g/dL (ref 3.5–5.0)
Alkaline Phosphatase: 28 U/L — ABNORMAL LOW (ref 38–126)
Anion gap: 13 (ref 5–15)
BUN: 12 mg/dL (ref 6–20)
CO2: 24 mmol/L (ref 22–32)
Calcium: 8.8 mg/dL — ABNORMAL LOW (ref 8.9–10.3)
Chloride: 101 mmol/L (ref 98–111)
Creatinine, Ser: 0.81 mg/dL (ref 0.61–1.24)
GFR calc Af Amer: >60 mL/min (ref >60)
GFR calc non Af Amer: >60 mL/min (ref >60)
Glucose, Bld: 94 mg/dL (ref 70–99)
Potassium: 3.3 mmol/L — ABNORMAL LOW (ref 3.5–5.1)
Sodium: 138 mmol/L (ref 135–145)
Total Bilirubin: 0.9 mg/dL (ref 0.3–1.2)
Total Protein: 6.7 g/dL (ref 6.5–8.1)

## 2019-07-23 LAB — C-REACTIVE PROTEIN: CRP: 5.9 mg/dL — ABNORMAL HIGH (ref <1.0)

## 2019-07-23 LAB — HEMOGLOBIN A1C
Hgb A1c MFr Bld: 8.6 % — ABNORMAL HIGH (ref 4.8–5.6)
Mean Plasma Glucose: 200.12 mg/dL

## 2019-07-23 LAB — GLUCOSE, CAPILLARY
Glucose-Capillary: 238 mg/dL — ABNORMAL HIGH (ref 70–99)
Glucose-Capillary: 206 mg/dL — ABNORMAL HIGH (ref 70–99)
Glucose-Capillary: 280 mg/dL — ABNORMAL HIGH (ref 70–99)

## 2019-07-23 LAB — ABO/RH: ABO/RH(D): A POS

## 2019-07-23 LAB — LACTATE DEHYDROGENASE: LDH: 198 U/L — ABNORMAL HIGH (ref 98–192)

## 2019-07-23 LAB — D-DIMER, QUANTITATIVE: D-Dimer, Quant: 0.38 ug/mL-FEU (ref 0.00–0.50)

## 2019-07-23 LAB — MAGNESIUM: Magnesium: 1.9 mg/dL (ref 1.7–2.4)

## 2019-07-23 MED ORDER — GABAPENTIN 300 MG PO CAPS
300.0000 mg | ORAL_CAPSULE | Freq: Three times a day (TID) | ORAL | Status: DC
  Administered 2019-07-23 – 2019-07-27 (×12): 300 mg via ORAL
  Filled 2019-07-23 (×13): qty 1

## 2019-07-23 MED ORDER — BISOPROLOL FUMARATE 5 MG PO TABS
5.0000 mg | ORAL_TABLET | Freq: Two times a day (BID) | ORAL | Status: DC
  Administered 2019-07-23 – 2019-07-27 (×8): 5 mg via ORAL
  Filled 2019-07-23 (×10): qty 1

## 2019-07-23 MED ORDER — FINASTERIDE 5 MG PO TABS
5.0000 mg | ORAL_TABLET | Freq: Every day | ORAL | Status: DC
  Administered 2019-07-24 – 2019-07-27 (×4): 5 mg via ORAL
  Filled 2019-07-23 (×5): qty 1

## 2019-07-23 MED ORDER — MEMANTINE HCL 10 MG PO TABS
10.0000 mg | ORAL_TABLET | Freq: Two times a day (BID) | ORAL | Status: DC
  Administered 2019-07-23 – 2019-07-27 (×8): 10 mg via ORAL
  Filled 2019-07-23 (×8): qty 1

## 2019-07-23 MED ORDER — FENOFIBRATE 160 MG PO TABS
160.0000 mg | ORAL_TABLET | Freq: Every day | ORAL | Status: DC
  Administered 2019-07-24 – 2019-07-27 (×4): 160 mg via ORAL
  Filled 2019-07-23 (×5): qty 1

## 2019-07-23 MED ORDER — INSULIN DETEMIR 100 UNIT/ML ~~LOC~~ SOLN
45.0000 [IU] | Freq: Every day | SUBCUTANEOUS | Status: DC
  Administered 2019-07-23 – 2019-07-24 (×2): 45 [IU] via SUBCUTANEOUS
  Filled 2019-07-23 (×2): qty 0.45

## 2019-07-23 MED ORDER — CITALOPRAM HYDROBROMIDE 40 MG PO TABS
40.0000 mg | ORAL_TABLET | Freq: Every day | ORAL | Status: DC
  Administered 2019-07-24 – 2019-07-27 (×4): 40 mg via ORAL
  Filled 2019-07-23 (×5): qty 1

## 2019-07-23 MED ORDER — GUAIFENESIN 100 MG/5ML PO SOLN
5.0000 mL | ORAL | Status: DC | PRN
  Administered 2019-07-23 – 2019-07-27 (×13): 100 mg via ORAL
  Filled 2019-07-23 (×13): qty 5

## 2019-07-23 MED ORDER — FAMOTIDINE 20 MG PO TABS
20.0000 mg | ORAL_TABLET | Freq: Two times a day (BID) | ORAL | Status: DC
  Administered 2019-07-23 – 2019-07-27 (×8): 20 mg via ORAL
  Filled 2019-07-23 (×8): qty 1

## 2019-07-23 MED ORDER — UMECLIDINIUM-VILANTEROL 62.5-25 MCG/INH IN AEPB
1.0000 | INHALATION_SPRAY | Freq: Every day | RESPIRATORY_TRACT | Status: DC
  Administered 2019-07-24 – 2019-07-27 (×3): 1 via RESPIRATORY_TRACT
  Filled 2019-07-23: qty 14

## 2019-07-23 MED ORDER — LORAZEPAM 0.5 MG PO TABS
0.5000 mg | ORAL_TABLET | Freq: Three times a day (TID) | ORAL | Status: DC | PRN
  Administered 2019-07-24 – 2019-07-26 (×3): 0.5 mg via ORAL
  Filled 2019-07-23 (×3): qty 1

## 2019-07-23 MED ORDER — LOPERAMIDE HCL 2 MG PO CAPS
2.0000 mg | ORAL_CAPSULE | ORAL | Status: DC | PRN
  Administered 2019-07-23: 2 mg via ORAL
  Filled 2019-07-23: qty 1

## 2019-07-23 MED ORDER — ENOXAPARIN SODIUM 60 MG/0.6ML ~~LOC~~ SOLN
60.0000 mg | Freq: Every day | SUBCUTANEOUS | Status: DC
  Administered 2019-07-24 – 2019-07-27 (×4): 60 mg via SUBCUTANEOUS
  Filled 2019-07-23 (×4): qty 0.6

## 2019-07-23 MED ORDER — IPRATROPIUM-ALBUTEROL 20-100 MCG/ACT IN AERS: 1.0000 | INHALATION_SPRAY | Freq: Four times a day (QID) | RESPIRATORY_TRACT | Status: DC | PRN

## 2019-07-23 NOTE — Plan of Care (Signed)
  Problem: Education: Goal: Knowledge of risk factors and measures for prevention of condition will improve Outcome: Progressing   Problem: Education: Goal: Knowledge of risk factors and measures for prevention of condition will improve Outcome: Progressing   Problem: Coping: Goal: Psychosocial and spiritual needs will be supported Outcome: Progressing   Problem: Respiratory: Goal: Will maintain a patent airway Outcome: Progressing Goal: Complications related to the disease process, condition or treatment will be avoided or minimized Outcome: Progressing   Problem: Respiratory: Goal: Complications related to the disease process, condition or treatment will be avoided or minimized Outcome: Progressing   Problem: Education: Goal: Knowledge of General Education information will improve Description: Including pain rating scale, medication(s)/side effects and non-pharmacologic comfort measures Outcome: Progressing   Problem: Respiratory: Goal: Will maintain a patent airway Outcome: Progressing Goal: Complications related to the disease process, condition or treatment will be avoided or minimized Outcome: Progressing   Problem: Respiratory: Goal: Complications related to the disease process, condition or treatment will be avoided or minimized Outcome: Progressing   Problem: Education: Goal: Knowledge of General Education information will improve Description: Including pain rating scale, medication(s)/side effects and non-pharmacologic comfort measures Outcome: Progressing   Problem: Health Behavior/Discharge Planning: Goal: Ability to manage health-related needs will improve Outcome: Progressing   Problem: Clinical Measurements: Goal: Ability to maintain clinical measurements within normal limits will improve Outcome: Progressing Goal: Will remain free from infection Outcome: Progressing Goal: Diagnostic test results will improve Outcome: Progressing Goal: Respiratory  complications will improve Outcome: Progressing Goal: Cardiovascular complication will be avoided Outcome: Progressing   Problem: Activity: Goal: Risk for activity intolerance will decrease Outcome: Progressing   Problem: Nutrition: Goal: Adequate nutrition will be maintained Outcome: Progressing   Problem: Coping: Goal: Level of anxiety will decrease Outcome: Progressing   Problem: Elimination: Goal: Will not experience complications related to bowel motility Outcome: Progressing Goal: Will not experience complications related to urinary retention Outcome: Progressing   Problem: Pain Managment: Goal: General experience of comfort will improve Outcome: Progressing   Problem: Safety: Goal: Ability to remain free from injury will improve Outcome: Progressing   Problem: Skin Integrity: Goal: Risk for impaired skin integrity will decrease Outcome: Progressing

## 2019-07-23 NOTE — Progress Notes (Signed)
Patient admitted as transfer from [facility].  Patient AOX4, in NAD, sats well on RA.  Admission V/S done.  Patient placed on telemetry.  2 person skin assessment done with Lottie Mussel.  Skin intact. Patient oriented to room and hospital.

## 2019-07-23 NOTE — Progress Notes (Addendum)
PROGRESS NOTE                                                                                                                                                                                                             Patient Demographics:    Brett Darko, is a 59 y.o. male, DOB - [birth year], YNW:[id number]  Admit date - 07/22/2019   Admitting Physician Norval Morton, MD  Outpatient Primary MD for the patient is Rip Harbour  LOS - 1   Chief Complaint  Patient presents with  . Shortness of Breath       Brief Narrative    59 y.o. male with medical history significant of early-onset dementia, asthma, COPD, chronic bronchitis, diastolic CHF, NASH, hypertension, hyperlipidemia, OSA, CVA, type 2 diabetes presenting to the ED with complaints of shortness of breath and sore throat.  He went to an urgent care center and was diagnosed with COVID-19.  Since then he has had progressively worsening dyspnea, chest tightness, and a dry cough.  Also having body aches.  He was admitted for COVID-19 pneumonia.    Subjective:    Jhonnie Aliano today ports dyspnea has improved, still reports some diarrhea, reports generalized weakness and body ache.   Assessment  & Plan :    Principal Problem:   COVID-19 virus infection Active Problems:   Essential hypertension   COPD GOLD  III   Acute respiratory failure with hypoxia (HCC)   Hypokalemia   COVID 19 pneumonia/COVID-19 gastroenteritis -Chest significant for bilateral opacity, he does report some dyspnea, he is on room air this morning. -Continue with steroids -Continue with IV remdesivir -He is on room air, see no indication for Actemra. -Continue to monitor inflammatory markers -He was encouraged use incentive spirometry, and flutter valve, and to get out of bed to chair. -We will start some Imodium for his significant diarrhea  COVID-19 Labs  Recent Labs    07/22/19 1008 07/22/19 1023  07/23/19 0025  DDIMER 0.31  --  0.38  FERRITIN  --  100  --   LDH 145  --  198*  CRP  --  3.0* 5.9*    Lab Results  Component Value Date   SARSCOV2NAA NEGATIVE 07/02/2019   Mild hypokalemia:  -Repleted, monitor closely  COPD:  - Stable.  No wheezing.  Combivent inhaler  q6 hrs. -Continue with home medications  Chronic diastolic congestive heart failure:   -No signs of volume overload.  Hold diuretic at this time.  Patient he remains with significant diarrhea  Hypertension:   -Resume home medications bisoprolol   Insulin-dependent type 2 diabetes:   -Continue sliding scale insulin with meals and CBG checks.  Uncontrolled, he is on significant dose Levemir at home, for now I will start at 45 units and adjust as needed.  Hyperlipidemia -Continue with home dose fenofibrate.  Anxiety/depression -Continue with home medication, but will lower his as needed Ativan.  Early dementia -Continue with Namenda.  Cirrhosis due to NASH:  -Monitor  Code Status : Full  Family Communication  : Discussed with wife by phone 3/2  Disposition Plan  : Home  Barriers For Discharge : Remains on IV remdesivir, weak, with significant diarrhea.  Consults  :  None  Procedures  : None  DVT Prophylaxis  :  Lovenox  Lab Results  Component Value Date   PLT 171 07/23/2019    Antibiotics  :    Anti-infectives (From admission, onward)   Start     Dose/Rate Route Frequency Ordered Stop   07/24/19 1000  remdesivir 100 mg in sodium chloride 0.9 % 100 mL IVPB     100 mg 200 mL/hr over 30 Minutes Intravenous Daily 07/22/19 2255 07/28/19 0959   07/23/19 1000  remdesivir 100 mg in sodium chloride 0.9 % 100 mL IVPB  Status:  Discontinued     100 mg 200 mL/hr over 30 Minutes Intravenous Daily 07/22/19 2233 07/22/19 2256   07/22/19 2359  remdesivir 100 mg in sodium chloride 0.9 % 100 mL IVPB     100 mg 200 mL/hr over 30 Minutes Intravenous Every 1 hr x 2 07/22/19 2255 07/23/19 0300    07/22/19 2245  remdesivir 200 mg in sodium chloride 0.9% 250 mL IVPB  Status:  Discontinued     200 mg 580 mL/hr over 30 Minutes Intravenous Once 07/22/19 2233 07/22/19 2256        Objective:   Vitals:   07/23/19 0000 07/23/19 0829 07/23/19 0849 07/23/19 1301  BP: (!) 141/80 (!) 96/56  130/61  Pulse: (!) 109   (!) 104  Resp: 20 18  16   Temp: 99 F (37.2 C) 97.9 F (36.6 C) 98.1 F (36.7 C) 98.3 F (36.8 C)  TempSrc: Oral Oral  Oral  SpO2: 93%   97%  Weight:      Height:        Wt Readings from Last 3 Encounters:  07/22/19 116.7 kg  07/03/19 116.5 kg  02/01/17 130.9 kg     Intake/Output Summary (Last 24 hours) at 07/23/2019 1351 Last data filed at 07/23/2019 1000 Gross per 24 hour  Intake 920 ml  Output 601 ml  Net 319 ml     Physical Exam  Awake Alert, No new F.N deficits, Normal affect Symmetrical Chest wall movement, Good air movement bilaterally, CTA RRR,No Gallops,Rubs or new Murmurs, No Parasternal Heave +ve B.Sounds, Abd Soft, No tenderness, No organomegaly appriciated, No rebound - guarding or rigidity. No Cyanosis, Clubbing or edema, No new Rash or bruise      Data Review:    CBC Recent Labs  Lab 07/22/19 1008 07/23/19 0025  WBC 6.1 6.5  HGB 13.6 14.0  HCT 41.6 42.2  PLT 163 171  MCV 90.2 90.0  MCH 29.5 29.9  MCHC 32.7 33.2  RDW 13.0 13.1  LYMPHSABS 1.4 1.4  MONOABS  0.4 0.3  EOSABS 0.0 0.0  BASOSABS 0.0 0.0    Chemistries  Recent Labs  Lab 07/22/19 1008 07/23/19 0025  NA 138 138  K 2.8* 3.3*  CL 101 101  CO2 26 24  GLUCOSE 106* 94  BUN 17 12  CREATININE 0.86 0.81  CALCIUM 9.0 8.8*  MG  --  1.9  AST 17 19  ALT 14 14  ALKPHOS 29* 28*  BILITOT 0.4 0.9   ------------------------------------------------------------------------------------------------------------------ Recent Labs    07/22/19 1008  TRIG 413*    Lab Results  Component Value Date   HGBA1C 8.6 (H) 07/23/2019    ------------------------------------------------------------------------------------------------------------------ No results for input(s): TSH, T4TOTAL, T3FREE, THYROIDAB in the last 72 hours.  Invalid input(s): FREET3 ------------------------------------------------------------------------------------------------------------------ Recent Labs    07/22/19 1023  FERRITIN 100    Coagulation profile Recent Labs  Lab 07/22/19 1008  INR 1.0    Recent Labs    07/22/19 1008 07/23/19 0025  DDIMER 0.31 0.38    Cardiac Enzymes No results for input(s): CKMB, TROPONINI, MYOGLOBIN in the last 168 hours.  Invalid input(s): CK ------------------------------------------------------------------------------------------------------------------    Component Value Date/Time   BNP 35.3 07/22/2019 1008   BNP 5.4 03/17/2016 1020    Inpatient Medications  Scheduled Meds: . vitamin C  500 mg Oral Daily  . cholecalciferol  1,000 Units Oral Daily  . dexamethasone (DECADRON) injection  6 mg Intravenous Daily  . [START ON 07/24/2019] enoxaparin (LOVENOX) injection  60 mg Subcutaneous Daily  . insulin aspart  0-15 Units Subcutaneous TID WC  . Ipratropium-Albuterol  1 puff Inhalation Q6H  . zinc sulfate  220 mg Oral Daily   Continuous Infusions: . [START ON 07/24/2019] remdesivir 100 mg in NS 100 mL     PRN Meds:.acetaminophen, chlorpheniramine-HYDROcodone, guaiFENesin, loperamide  Micro Results Recent Results (from the past 240 hour(s))  Culture, blood (Routine x 2)     Status: None (Preliminary result)   Collection Time: 07/22/19 10:08 AM   Specimen: BLOOD  Result Value Ref Range Status   Specimen Description   Final    BLOOD RIGHT ANTECUBITAL Performed at [facility], [address] [place], [place] [address]    Special Requests   Final    BOTTLES DRAWN AEROBIC AND ANAEROBIC Blood Culture adequate volume Performed at [facility], [address] [place], [place] [address]    Culture   Final    NO GROWTH < 24 HOURS Performed at [facility], [place] [address], [place], [place] [address]    Report Status PENDING  Incomplete  Culture, blood (Routine x 2)     Status: None (Preliminary result)   Collection Time: 07/22/19 10:15 AM   Specimen: BLOOD  Result Value Ref Range Status   Specimen Description   Final    BLOOD LEFT ANTECUBITAL Performed at [facility], [address] [place], [place] [address]    Special Requests   Final    BOTTLES DRAWN AEROBIC AND ANAEROBIC Blood Culture adequate volume Performed at [facility], [address] [place], [place] [address]    Culture   Final    NO GROWTH < 24 HOURS Performed at [facility], [place] [address] [place], [place] [address]    Report Status PENDING  Incomplete  SARS Coronavirus 2 Ag (30 min TAT) -     Status: Abnormal   Collection Time: 07/22/19 10:25 AM   Specimen: Nasal Swab  Result Value Ref Range Status   SARS Coronavirus 2 Ag POSITIVE (A) NEGATIVE Final    Comment: RESULT CALLED TO, READ BACK BY AND VERIFIED WITH: CALLED TO M.SIMS ON [id number] AT 1130 BY SROY (NOTE) SARS-CoV-2 antigen PRESENT. Positive results indicate the presence of viral antigens, but clinical correlation with patient history and other diagnostic information is necessary to determine patient infection status.  Positive results do not rule out bacterial infection or co-infection  with other viruses. False positive results are rare but can occur, and confirmatory RT-PCR testing may be appropriate in some circumstances. The expected result is Negative. Fact Sheet for Patients: PodPark.tn Fact Sheet for Providers: GiftContent.is  This test is not yet approved or cleared by the [place] FDA and  has been authorized for detection and/or diagnosis of SARS-CoV-2 by FDA under an Emergency Use Authorization (EUA).  This  EUA will remain in effect (meaning this test can be used) for the duration of  the COVID-19  declaration under Section 564(b)(1) of the Act, 21 U.S.C. section 360bbb-3(b)(1), unless the authorization is terminated or revoked sooner. Performed at [facility], [address], [place], [place] [address]     Radiology Reports CT CHEST WO CONTRAST  Result Date: 07/23/2019 CLINICAL DATA:  COVID-19 positive. Cough. EXAM: CT CHEST WITHOUT CONTRAST TECHNIQUE: Multidetector CT imaging of the chest was performed following the standard protocol without IV contrast. COMPARISON:  One-view chest x-ray 07/22/2019. CT heart 07/03/2019 FINDINGS: Cardiovascular: Atherosclerotic calcifications are noted in the LAD and right coronary artery. Heart size is normal. No significant pericardial effusion is present. Minimal vascular calcifications are present at the arch without aneurysm or definite stenosis. Mediastinum/Nodes: No significant mediastinal, hilar, or axillary adenopathy is present. The thoracic inlet is normal. Esophagus is unremarkable. Lungs/Pleura: Patchy bilateral airspace opacities are mostly peripheral and more prominent at the lung bases. No significant endobronchial disease is present. No significant pleural effusions or pneumothorax are present. Upper Abdomen: Cholecystectomy is noted. Limited imaging of the upper abdomen is otherwise unremarkable. Musculoskeletal: Fused anterior osteophytes are present in the thoracic spine. Vertebral body heights and alignment are maintained. No focal lytic or blastic lesions are present. The ribs are within normal limits bilaterally. IMPRESSION: 1. Patchy bilateral airspace opacities are mostly peripheral and more prominent at the lung bases. This is consistent with multi lobar COVID pneumonia. 2. Atherosclerosis including coronary artery disease. 3. Fused anterior osteophytes in the thoracic spine compatible with diffuse idiopathic skeletal hyperostosis.  Electronically Signed   By: San Morelle M.D.   On: 07/23/2019 07:03   CT CORONARY MORPH W/CTA COR W/SCORE W/CA W/CM &/OR WO/CM  Addendum Date: 07/03/2019   ADDENDUM REPORT: 07/03/2019 15:44 CLINICAL DATA:  38M with chest pain EXAM: Cardiac/Coronary CTA TECHNIQUE: The patient was scanned on a [employer]. FINDINGS: A 100 kV prospective scan was triggered in the descending thoracic aorta at 111 HU's. Axial non-contrast 3 mm slices were carried out through the heart. The data set was analyzed on a dedicated work station and scored using the [place]. Gantry rotation speed was 250 msecs and collimation was .6 mm. No beta blockade and 0.8 mg of sl NTG was given. The 3D data set was reconstructed in 5% intervals of the 67-82 % of the R-R cycle. Diastolic phases were analyzed on a dedicated work station using MPR, MIP and VRT modes. The patient received 80 cc of contrast. Coronary Arteries:  Normal coronary origin.  Right dominance. RCA is a large dominant  artery that gives rise to PDA and PLA. There is calcified plaque in the proximal and distal RCA causing minimal (0-24%) stenosis Left main is a large artery that gives rise to LAD and LCX arteries. There is calcified plaque in the distal left main causing minimal (0-24%) stenosis LAD is a large vessel. There is calcified plaque in the proximal and mid LAD causing minimal (0-24%) stenosis. 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 There is no plaque. Other findings: Left Ventricle: Normal size Left Atrium: Mild dilatation Pulmonary Veins: Normal configuration Right Ventricle: Normal size Right Atrium: Mild dilatation Cardiac valves: No calcifications Thoracic aorta: Normal size Pulmonary Arteries: Normal size Systemic Veins: Normal drainage Pericardium: Normal thickness IMPRESSION: 1. Coronary calcium score of 581. This was 97percentile for age and sex matched control. 2. Normal  coronary origin with right dominance. 3. Nonobstructive CAD. Calcified plaque throughout LAD and RCA causing minimal (0-24%) stenosis 4. 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 Minimal non-obstructive CAD (0-24%). Consider non-atherosclerotic causes of chest pain. Consider preventive therapy and risk factor modification. Electronically Signed   By: ChOswaldo MilianD   On: 07/03/2019 15:44   Result Date: 07/03/2019 EXAM: OVER-READ INTERPRETATION  CT CHEST The following report is an over-read performed by radiologist Dr. GeZetta Billsf [facility] [place] 07/03/2019. This over-read does not include interpretation of cardiac or coronary anatomy or pathology. The coronary calcium score/coronary CTA interpretation by the cardiologist is attached. COMPARISON:  07/02/2019 FINDINGS: Vascular: Ascending aortic caliber 3.5 cm. Descending thoracic aorta at 2.5 cm. Please see dedicated report regarding cardiac structures. Small pulmonary arterial to venous communication on images 112-140 tracking from lower lobe venous branches back to arterial branches in the pulmonary bed. No significant dilation of the vascular bed. Mediastinum/Nodes: Limited imaging of the mediastinum shows no signs of adenopathy. Limited imaging of the esophagus is normal. Lungs/Pleura: Basilar atelectasis. No signs of pleural effusion. Upper Abdomen: Incidental imaging of upper abdominal contents is normal. Musculoskeletal: No signs of acute bone finding or destructive bone process. Spinal degenerative changes. IMPRESSION: 1. Mild thoracic aortic dilation. Recommend annual imaging followup by CTA or MRA. This recommendation follows 2010 ACCF/AHA/AATS/ACR/ASA/SCA/SCAI/SIR/STS/SVM Guidelines for the Diagnosis and Management of Patients with Thoracic Aortic Disease. Circulation.2010; 121: : Y403-K742Aortic aneurysm NOS (ICD10-I71.9) 2. 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  Significance uncertain. Consider interventional radiology referral for further evaluation as clinically warranted. 3. Please see dedicated report regarding cardiac structures. Electronically Signed: By: GeZetta Bills.D. On: 07/03/2019 13:52   DG Chest Portable 1 View  Result Date: 07/22/2019 CLINICAL DATA:  Shortness of breath.  COVID positive. EXAM: PORTABLE CHEST 1 VIEW COMPARISON:  07/02/2019 FINDINGS: Heart size is normal. No pleural effusion or edema. Decreased lung volumes with bibasilar atelectasis. No airspace consolidation. IMPRESSION: 1. Low lung volumes with bibasilar atelectasis Electronically Signed   By: TaKerby Moors.D.   On: 07/22/2019 10:08   DG Chest Port 1 View  Result Date: 07/02/2019 CLINICAL DATA:  Short of breath, weakness, fatigue EXAM: PORTABLE CHEST 1 VIEW COMPARISON:  11/27/2016 FINDINGS: The heart size and mediastinal contours are within normal limits. Both lungs are clear. The visualized skeletal structures are unremarkable. IMPRESSION: No active disease. Electronically Signed   By: MiRanda Ngo.D.   On: 07/02/2019 15:00   ECHOCARDIOGRAM COMPLETE  Result Date: 07/03/2019    ECHOCARDIOGRAM  REPORT   Patient Name:   Bryan Becker Date of Exam: 07/03/2019 Medical Rec #:  [id number]       Height:       69.0 in Accession #:    [id number]      Weight:       256.8 lb Date of Birth:  [birth year]       BSA:          2.30 m Patient Age:    17 years        BP:           144/68 mmHg Patient Gender: M               HR:           78 bpm. Exam Location:  Inpatient Procedure: 2D Echo Indications:    Chest Pain R07.9  History:        Patient has prior history of Echocardiogram examinations, most                 recent 04/18/2016. CHF, COPD; Risk Factors:Hypertension,                 Diabetes and Dyslipidemia.  Sonographer:    Mikki Santee RDCS (AE) Referring Phys: [id number] [place]  1. Left ventricular ejection fraction, by estimation, is 65 to 70%. The left  ventricle has normal function. The left ventrical has no regional wall motion abnormalities. Left ventricular diastolic parameters are indeterminate. Elevated left ventricular end-diastolic pressure.  2. Right ventricular systolic function is normal. The right ventricular size is mildly enlarged. Tricuspid regurgitation signal is inadequate for assessing PA pressure.  3. The mitral valve is normal in structure and function. no evidence of mitral valve regurgitation. No evidence of mitral stenosis.  4. The aortic valve is normal in structure and function. Aortic valve regurgitation is not visualized. No aortic stenosis is present.  5. The inferior vena cava is dilated in size with >50% respiratory variability, suggesting right atrial pressure of 8 mmHg. FINDINGS  Left Ventricle: Left ventricular ejection fraction, by estimation, is 65 to 70%. The left ventricle has normal function. The left ventricle has no regional wall motion abnormalities. The left ventricular internal cavity size was normal in size. There is  no left ventricular hypertrophy. Left ventricular diastolic parameters are indeterminate. Right Ventricle: The right ventricular size is mildly enlarged. No increase in right ventricular wall thickness. Right ventricular systolic function is normal. Tricuspid regurgitation signal is inadequate for assessing PA pressure. Left Atrium: Left atrial size was normal in size. Right Atrium: Right atrial size was normal in size. Pericardium: There is no evidence of pericardial effusion. Mitral Valve: The mitral valve is normal in structure and function. Normal mobility of the mitral valve leaflets. No evidence of mitral valve regurgitation. No evidence of mitral valve stenosis. Tricuspid Valve: The tricuspid valve is normal in structure. Tricuspid valve regurgitation is not demonstrated. No evidence of tricuspid stenosis. Aortic Valve: The aortic valve is normal in structure and function. Aortic valve regurgitation  is not visualized. No aortic stenosis is present. Pulmonic Valve: The pulmonic valve was normal in structure. Pulmonic valve regurgitation is trivial. No evidence of pulmonic stenosis. Aorta: The aortic root is normal in size and structure. Venous: The inferior vena cava is dilated in size with greater than 50% respiratory variability, suggesting right atrial pressure of 8 mmHg. The inferior vena cava and the hepatic vein show a normal flow pattern. IAS/Shunts: No atrial level  shunt detected by color flow Doppler.  LEFT VENTRICLE PLAX 2D LVIDd:         5.10 cm  Diastology LVIDs:         3.00 cm  LV e' lateral:   7.18 cm/s LV PW:         1.00 cm  LV E/e' lateral: 12.6 LV IVS:        1.00 cm  LV e' medial:    6.53 cm/s LVOT diam:     2.20 cm  LV E/e' medial:  13.9 LV SV:         98.83 ml LV SV Index:   36.54 LVOT Area:     3.80 cm  RIGHT VENTRICLE TAPSE (M-mode): 2.0 cm LEFT ATRIUM             Index       RIGHT ATRIUM           Index LA diam:        3.70 cm 1.61 cm/m  RA Area:     18.40 cm LA Vol (A2C):   75.8 ml 32.99 ml/m RA Volume:   48.80 ml  21.24 ml/m LA Vol (A4C):   61.4 ml 26.72 ml/m LA Biplane Vol: 68.9 ml 29.99 ml/m  AORTIC VALVE LVOT Vmax:   109.00 cm/s LVOT Vmean:  75.800 cm/s LVOT VTI:    0.260 m  AORTA Ao Root diam: 3.30 cm MITRAL VALVE MV Area (PHT): 3.42 cm             SHUNTS MV Decel Time: 222 msec             Systemic VTI:  0.26 m MV E velocity: 90.80 cm/s 103 cm/s  Systemic Diam: 2.20 cm MV A velocity: 89.50 cm/s 70.3 cm/s MV E/A ratio:  1.01       1.5 Fransico Him MD Electronically signed by Fransico Him MD Signature Date/Time: 07/03/2019/10:40:08 AM    Final    CT Angio Chest/Abd/Pel for Dissection W and/or W/WO  Result Date: 07/02/2019 CLINICAL DATA:  Shortness of breath, weakness, fatigue.  Chest pain EXAM: CT ANGIOGRAPHY CHEST, ABDOMEN AND PELVIS TECHNIQUE: Multidetector CT imaging through the chest, abdomen and pelvis was performed using the standard protocol during bolus  administration of intravenous contrast. Multiplanar reconstructed images and MIPs were obtained and reviewed to evaluate the vascular anatomy. CONTRAST:  129m OMNIPAQUE IOHEXOL 350 MG/ML SOLN COMPARISON:  09/26/2018 FINDINGS: CTA CHEST FINDINGS Cardiovascular: Heart is normal size. Aorta normal caliber. No dissection. No filling defects in the pulmonary arteries to suggest pulmonary emboli. Mediastinum/Nodes: No mediastinal, hilar, or axillary adenopathy. Trachea and esophagus are unremarkable. Thyroid unremarkable. Lungs/Pleura: Lungs are clear. No focal airspace opacities or suspicious nodules. No effusions. Musculoskeletal: No acute bony abnormality. Chest wall soft tissues unremarkable. Review of the MIP images confirms the above findings. CTA ABDOMEN AND PELVIS FINDINGS VASCULAR Aorta: Normal caliber aorta without aneurysm, dissection, vasculitis or significant stenosis. Scattered calcifications in the infrarenal aorta. Celiac: Patent without evidence of aneurysm, dissection, vasculitis or significant stenosis. SMA: Patent without evidence of aneurysm, dissection, vasculitis or significant stenosis. Renals: Both renal arteries are patent without evidence of aneurysm, dissection, vasculitis, fibromuscular dysplasia or significant stenosis. IMA: Patent without evidence of aneurysm, dissection, vasculitis or significant stenosis. Inflow: Patent without evidence of aneurysm, dissection, vasculitis or significant stenosis. Veins: No obvious venous abnormality within the limitations of this arterial phase study. Review of the MIP images confirms the above findings. NON-VASCULAR Hepatobiliary: Prior cholecystectomy.  No focal  hepatic abnormality. Pancreas: No focal abnormality or ductal dilatation. Spleen: No focal abnormality.  Normal size. Adrenals/Urinary Tract: No adrenal abnormality. No focal renal abnormality. No stones or hydronephrosis. Urinary bladder is unremarkable. Stomach/Bowel: Normal appendix. Stomach,  large and small bowel grossly unremarkable. Lymphatic: No adenopathy Reproductive: Prostate enlargement. Brachy therapy seeds in the region of the prostate. Other: No free fluid or free air. Musculoskeletal: No acute bony abnormality. Review of the MIP images confirms the above findings. IMPRESSION: No evidence of aortic aneurysm or dissection. Atherosclerotic calcifications in the infrarenal aorta. No acute cardiopulmonary disease. No acute findings in the abdomen or pelvis. Electronically Signed   By: Rolm Baptise M.D.   On: 07/02/2019 17:32      Phillips Climes M.D on 07/23/2019 at 1:51 PM  Between 7am to 7pm - Pager - [phone]  After 7pm go to [email or web address] - password [facility]  [facility] -  Office  [phone]

## 2019-07-24 DIAGNOSIS — J449 Chronic obstructive pulmonary disease, unspecified: Secondary | ICD-10-CM

## 2019-07-24 LAB — GLUCOSE, CAPILLARY
Glucose-Capillary: 294 mg/dL — ABNORMAL HIGH (ref 70–99)
Glucose-Capillary: 234 mg/dL — ABNORMAL HIGH (ref 70–99)
Glucose-Capillary: 243 mg/dL — ABNORMAL HIGH (ref 70–99)
Glucose-Capillary: 280 mg/dL — ABNORMAL HIGH (ref 70–99)
Glucose-Capillary: 260 mg/dL — ABNORMAL HIGH (ref 70–99)

## 2019-07-24 LAB — COMPREHENSIVE METABOLIC PANEL
ALT: 14 U/L (ref 0–44)
AST: 15 U/L (ref 15–41)
Albumin: 3.3 g/dL — ABNORMAL LOW (ref 3.5–5.0)
Alkaline Phosphatase: 25 U/L — ABNORMAL LOW (ref 38–126)
Anion gap: 11 (ref 5–15)
BUN: 18 mg/dL (ref 6–20)
CO2: 25 mmol/L (ref 22–32)
Calcium: 9.2 mg/dL (ref 8.9–10.3)
Chloride: 103 mmol/L (ref 98–111)
Creatinine, Ser: 0.65 mg/dL (ref 0.61–1.24)
GFR calc Af Amer: >60 mL/min (ref >60)
GFR calc non Af Amer: >60 mL/min (ref >60)
Glucose, Bld: 251 mg/dL — ABNORMAL HIGH (ref 70–99)
Potassium: 4.2 mmol/L (ref 3.5–5.1)
Sodium: 139 mmol/L (ref 135–145)
Total Bilirubin: 0.6 mg/dL (ref 0.3–1.2)
Total Protein: 6.5 g/dL (ref 6.5–8.1)

## 2019-07-24 LAB — CBC WITH DIFFERENTIAL/PLATELET
Abs Immature Granulocytes: 0 10*3/uL (ref 0.00–0.07)
Basophils Absolute: 0 10*3/uL (ref 0.0–0.1)
Basophils Relative: 0 %
Eosinophils Absolute: 0 10*3/uL (ref 0.0–0.5)
Eosinophils Relative: 0 %
HCT: 41.8 % (ref 39.0–52.0)
Hemoglobin: 13.5 g/dL (ref 13.0–17.0)
Lymphocytes Relative: 20 %
Lymphs Abs: 1 10*3/uL (ref 0.7–4.0)
MCH: 29.1 pg (ref 26.0–34.0)
MCHC: 32.3 g/dL (ref 30.0–36.0)
MCV: 90.1 fL (ref 80.0–100.0)
Monocytes Absolute: 0.1 10*3/uL (ref 0.1–1.0)
Monocytes Relative: 2 %
Neutro Abs: 3.9 10*3/uL (ref 1.7–7.7)
Neutrophils Relative %: 78 %
Platelets: 192 10*3/uL (ref 150–400)
RBC: 4.64 MIL/uL (ref 4.22–5.81)
RDW: 12.9 % (ref 11.5–15.5)
WBC: 5 10*3/uL (ref 4.0–10.5)
nRBC: 0 % (ref 0.0–0.2)
nRBC: 0 /100 WBC

## 2019-07-24 LAB — LACTATE DEHYDROGENASE: LDH: 186 U/L (ref 98–192)

## 2019-07-24 LAB — D-DIMER, QUANTITATIVE: D-Dimer, Quant: 1.29 ug/mL-FEU — ABNORMAL HIGH (ref 0.00–0.50)

## 2019-07-24 LAB — C-REACTIVE PROTEIN: CRP: 5.4 mg/dL — ABNORMAL HIGH (ref <1.0)

## 2019-07-24 MED ORDER — SUCRALFATE 1 G PO TABS
1.0000 g | ORAL_TABLET | Freq: Three times a day (TID) | ORAL | Status: DC
  Administered 2019-07-24 – 2019-07-27 (×12): 1 g via ORAL
  Filled 2019-07-24 (×12): qty 1

## 2019-07-24 MED ORDER — INSULIN ASPART 100 UNIT/ML ~~LOC~~ SOLN
0.0000 [IU] | Freq: Three times a day (TID) | SUBCUTANEOUS | Status: DC
  Administered 2019-07-24: 11 [IU] via SUBCUTANEOUS
  Administered 2019-07-24: 7 [IU] via SUBCUTANEOUS
  Administered 2019-07-25: 3 [IU] via SUBCUTANEOUS
  Administered 2019-07-25: 11 [IU] via SUBCUTANEOUS
  Administered 2019-07-25: 15 [IU] via SUBCUTANEOUS
  Administered 2019-07-26: 4 [IU] via SUBCUTANEOUS
  Administered 2019-07-26: 11 [IU] via SUBCUTANEOUS
  Administered 2019-07-27: 4 [IU] via SUBCUTANEOUS

## 2019-07-24 MED ORDER — PREGABALIN 75 MG PO CAPS
75.0000 mg | ORAL_CAPSULE | Freq: Two times a day (BID) | ORAL | Status: DC
  Administered 2019-07-24 – 2019-07-27 (×7): 75 mg via ORAL
  Filled 2019-07-24 (×7): qty 1

## 2019-07-24 MED ORDER — FUROSEMIDE 40 MG PO TABS
40.0000 mg | ORAL_TABLET | Freq: Every day | ORAL | Status: DC
  Administered 2019-07-24 – 2019-07-27 (×4): 40 mg via ORAL
  Filled 2019-07-24 (×4): qty 1

## 2019-07-24 MED ORDER — CLOPIDOGREL BISULFATE 75 MG PO TABS
75.0000 mg | ORAL_TABLET | Freq: Every day | ORAL | Status: DC
  Administered 2019-07-24 – 2019-07-27 (×4): 75 mg via ORAL
  Filled 2019-07-24 (×4): qty 1

## 2019-07-24 MED ORDER — INSULIN ASPART 100 UNIT/ML ~~LOC~~ SOLN
4.0000 [IU] | Freq: Three times a day (TID) | SUBCUTANEOUS | Status: DC
  Administered 2019-07-24 – 2019-07-26 (×6): 4 [IU] via SUBCUTANEOUS

## 2019-07-24 MED ORDER — INSULIN DETEMIR 100 UNIT/ML ~~LOC~~ SOLN
52.0000 [IU] | Freq: Every day | SUBCUTANEOUS | Status: DC
  Administered 2019-07-25 – 2019-07-26 (×2): 52 [IU] via SUBCUTANEOUS
  Filled 2019-07-24 (×2): qty 0.52

## 2019-07-24 MED ORDER — PANTOPRAZOLE SODIUM 40 MG PO TBEC
40.0000 mg | DELAYED_RELEASE_TABLET | Freq: Every day | ORAL | Status: DC
  Administered 2019-07-24 – 2019-07-27 (×4): 40 mg via ORAL
  Filled 2019-07-24 (×4): qty 1

## 2019-07-24 NOTE — Progress Notes (Signed)
PROGRESS NOTE                                                                                                                                                                                                             Patient Demographics:    Bryan Becker, is a 59 y.o. male, DOB - 04-18-1961, WOE:[id number]  Outpatient Primary MD for the patient is Robert Bellow, PA-C   Admit date - 07/22/2019   LOS - 2  Chief Complaint  Patient presents with  . Shortness of Breath       Brief Narrative: Patient is a 59 y.o. male with PMHx of early onset dementia, COPD, chronic diastolic heart failure, NASH, HTN, HLD, CVA, DM-2 who presented with diarrhea, vomiting, shortness of breath-was found to have COVID-19 pneumonia.  Significant Events: 3/1>> admit to [facility]  Microbiology data: 3/1>> blood cultures: Negative    Subjective:    Ann Lions today    Assessment  & Plan :   Covid 19 Viral pneumonia and gastroenteritis: Stable on room air-feels better-GI symptoms have completely resolved.  Continue steroids/remdesivir.  Follow clinical trajectory and inflammatory markers.  Fever: afebrile  O2 requirements:  SpO2: 99 %   COVID-19 Labs: Recent Labs    07/22/19 1008 07/22/19 1023 07/23/19 0025 07/24/19 0427  DDIMER 0.31  --  0.38 1.29*  FERRITIN  --  100  --   --   LDH 145  --  198* 186  CRP  --  3.0* 5.9* 5.4*       Component Value Date/Time   BNP 35.3 07/22/2019 1008   BNP 5.4 03/17/2016 1020    Recent Labs  Lab 07/22/19 1008  PROCALCITON <0.10    Lab Results  Component Value Date   SARSCOV2NAA NEGATIVE 07/02/2019     COVID-19 Medications: Steroids: 3/1>> Remdesivir: 3/1>>  Prone/Incentive Spirometry: encouraged  incentive spirometry use 3-4/hour.  DVT Prophylaxis  :  Lovenox   COPD: Stable-continue bronchodilators  Chronic diastolic heart failure: Euvolemic on exam-since GI symptoms have  resolved-resume diuretics  Nonobstructive CAD: No anginal symptoms-recently evaluated by cardiology in February 21 during his most recent hospitalization.  Remains on Plavix.  History of [place]/cirrhosis: Stable for outpatient monitoring  HTN: Controlled-continue bisoprolol  DM-2 (A1c 8.6 on 07/23/2019) uncontrolled hyperglycemia due to steroids: CBGs still uncontrolled-increase Levemir to 52 units (next  dose on 3/4) add 4 units of NovoLog with meals.  Change SSI to resistant scale.  Hold oral hypoglycemics for now.  Recent Labs    07/23/19 1634 07/23/19 2117 07/24/19 0826  GLUCAP 280* 294* 234*   HLD: Continue fenofibrate  Neuropathy: Continue Neurontin.  GERD: Continue PPI  Anxiety/depression: Stable continue Celexa and as needed Ativan  Dementia: Appears to be early-continue Namenda.  Currently answers all my questions appropriately.  Obesity: Estimated body mass index is 38 kg/m as calculated from the following:   Height as of this encounter: 5' 9"  (1.753 m).   Weight as of this encounter: 116.7 kg.    Consults  :  None  Procedures  :  None ABG: No results found for: PHART, PCO2ART, PO2ART, HCO3, TCO2, ACIDBASEDEF, O2SAT  Vent Settings: N/A    Condition - Stable  Family Communication  : Left a voicemail for patient's spouse on 3/3  Code Status :  Full Code  Diet :  Diet Order            Diet heart healthy/carb modified Room service appropriate? Yes; Fluid consistency: Thin  Diet effective now               Disposition Plan  : Home when medically stable.  Barriers to discharge: Complete 5 days of IV Remdesivir (but if he does well over the next few days-we can consider early discharge and arrangement for remdesivir in the infusion center)  Antimicorbials  :    Anti-infectives (From admission, onward)   Start     Dose/Rate Route Frequency Ordered Stop   07/24/19 1000  remdesivir 100 mg in sodium chloride 0.9 % 100 mL IVPB     100 mg 200 mL/hr over 30  Minutes Intravenous Daily 07/22/19 2255 07/28/19 0959   07/23/19 1000  remdesivir 100 mg in sodium chloride 0.9 % 100 mL IVPB  Status:  Discontinued     100 mg 200 mL/hr over 30 Minutes Intravenous Daily 07/22/19 2233 07/22/19 2256   07/22/19 2359  remdesivir 100 mg in sodium chloride 0.9 % 100 mL IVPB     100 mg 200 mL/hr over 30 Minutes Intravenous Every 1 hr x 2 07/22/19 2255 07/23/19 0300   07/22/19 2245  remdesivir 200 mg in sodium chloride 0.9% 250 mL IVPB  Status:  Discontinued     200 mg 580 mL/hr over 30 Minutes Intravenous Once 07/22/19 2233 07/22/19 2256      Inpatient Medications  Scheduled Meds: . vitamin C  500 mg Oral Daily  . bisoprolol  5 mg Oral BID  . cholecalciferol  1,000 Units Oral Daily  . citalopram  40 mg Oral Daily  . dexamethasone (DECADRON) injection  6 mg Intravenous Daily  . enoxaparin (LOVENOX) injection  60 mg Subcutaneous Daily  . famotidine  20 mg Oral BID  . fenofibrate  160 mg Oral Daily  . finasteride  5 mg Oral Daily  . gabapentin  300 mg Oral TID  . insulin aspart  0-15 Units Subcutaneous TID WC  . insulin detemir  45 Units Subcutaneous Daily  . memantine  10 mg Oral BID  . umeclidinium-vilanterol  1 puff Inhalation Daily  . zinc sulfate  220 mg Oral Daily   Continuous Infusions: . remdesivir 100 mg in NS 100 mL 100 mg (07/24/19 0954)   PRN Meds:.acetaminophen, chlorpheniramine-HYDROcodone, guaiFENesin, Ipratropium-Albuterol, loperamide, LORazepam   Time Spent in minutes  25  See all Orders from today for further details  Oren Binet M.D on 07/24/2019 at 11:49 AM  To page go to [email or web address] - use universal password  [facility] -  Office  [phone]    Objective:   Vitals:   07/23/19 2216 07/24/19 0030 07/24/19 0554 07/24/19 1005  BP: 118/61 130/67  120/69  Pulse: 82 75  76  Resp: 18 16  16   Temp: 98.6 F (37 C) 98.3 F (36.8 C) 98 F (36.7 C) 98.1 F (36.7 C)  TempSrc: Oral Oral Oral Oral  SpO2: 95% 99%     Weight:      Height:        Wt Readings from Last 3 Encounters:  07/22/19 116.7 kg  07/03/19 116.5 kg  02/01/17 130.9 kg     Intake/Output Summary (Last 24 hours) at 07/24/2019 1149 Last data filed at 07/24/2019 0500 Gross per 24 hour  Intake 1080 ml  Output 2450 ml  Net -1370 ml     Physical Exam Gen Exam:Alert awake-not in any distress HEENT:atraumatic, normocephalic Chest: B/L clear to auscultation anteriorly CVS:S1S2 regular Abdomen:soft non tender, non distended Extremities:no edema Neurology: Non focal Skin: no rash   Data Review:    CBC Recent Labs  Lab 07/22/19 1008 07/23/19 0025 07/24/19 0427  WBC 6.1 6.5 5.0  HGB 13.6 14.0 13.5  HCT 41.6 42.2 41.8  PLT 163 171 192  MCV 90.2 90.0 90.1  MCH 29.5 29.9 29.1  MCHC 32.7 33.2 32.3  RDW 13.0 13.1 12.9  LYMPHSABS 1.4 1.4 1.0  MONOABS 0.4 0.3 0.1  EOSABS 0.0 0.0 0.0  BASOSABS 0.0 0.0 0.0    Chemistries  Recent Labs  Lab 07/22/19 1008 07/23/19 0025 07/24/19 0427  NA 138 138 139  K 2.8* 3.3* 4.2  CL 101 101 103  CO2 26 24 25   GLUCOSE 106* 94 251*  BUN 17 12 18   CREATININE 0.86 0.81 0.65  CALCIUM 9.0 8.8* 9.2  MG  --  1.9  --   AST 17 19 15   ALT 14 14 14   ALKPHOS 29* 28* 25*  BILITOT 0.4 0.9 0.6   ------------------------------------------------------------------------------------------------------------------ Recent Labs    07/22/19 1008  TRIG 413*    Lab Results  Component Value Date   HGBA1C 8.6 (H) 07/23/2019   ------------------------------------------------------------------------------------------------------------------ No results for input(s): TSH, T4TOTAL, T3FREE, THYROIDAB in the last 72 hours.  Invalid input(s): FREET3 ------------------------------------------------------------------------------------------------------------------ Recent Labs    07/22/19 1023  FERRITIN 100    Coagulation profile Recent Labs  Lab 07/22/19 1008  INR 1.0    Recent Labs     07/23/19 0025 07/24/19 0427  DDIMER 0.38 1.29*    Cardiac Enzymes No results for input(s): CKMB, TROPONINI, MYOGLOBIN in the last 168 hours.  Invalid input(s): CK ------------------------------------------------------------------------------------------------------------------    Component Value Date/Time   BNP 35.3 07/22/2019 1008   BNP 5.4 03/17/2016 1020    Micro Results Recent Results (from the past 240 hour(s))  Culture, blood (Routine x 2)     Status: None (Preliminary result)   Collection Time: 07/22/19 10:08 AM   Specimen: BLOOD  Result Value Ref Range Status   Specimen Description BLOOD RIGHT ANTECUBITAL  Final   Special Requests   Final    BOTTLES DRAWN AEROBIC AND ANAEROBIC Blood Culture adequate volume Performed at [facility], [address] [place], [place] [address]    Culture NO GROWTH 1 DAY  Final   Report Status PENDING  Incomplete  Culture, blood (Routine x 2)     Status: None (  Preliminary result)   Collection Time: 07/22/19 10:15 AM   Specimen: BLOOD  Result Value Ref Range Status   Specimen Description BLOOD LEFT ANTECUBITAL  Final   Special Requests   Final    BOTTLES DRAWN AEROBIC AND ANAEROBIC Blood Culture adequate volume Performed at [facility], [address] [place], [place] [address]    Culture NO GROWTH 1 DAY  Final   Report Status PENDING  Incomplete  SARS Coronavirus 2 Ag (30 min TAT) -     Status: Abnormal   Collection Time: 07/22/19 10:25 AM   Specimen: Nasal Swab  Result Value Ref Range Status   SARS Coronavirus 2 Ag POSITIVE (A) NEGATIVE Final    Comment: RESULT CALLED TO, READ BACK BY AND VERIFIED WITH: CALLED TO M.SIMS ON [id number] AT 1130 BY SROY (NOTE) SARS-CoV-2 antigen PRESENT. Positive results indicate the presence of viral antigens, but clinical correlation with patient history and other diagnostic information is necessary to determine patient infection status.  Positive results do not rule out  bacterial infection or co-infection  with other viruses. False positive results are rare but can occur, and confirmatory RT-PCR testing may be appropriate in some circumstances. The expected result is Negative. Fact Sheet for Patients: PodPark.tn Fact Sheet for Providers: GiftContent.is  This test is not yet approved or cleared by the [place] FDA and  has been authorized for detection and/or diagnosis of SARS-CoV-2 by FDA under an Emergency Use Authorization (EUA).  This EUA will remain in effect (meaning this test can be used) for the duration of  the COVID-19  declaration under Section 564(b)(1) of the Act, 21 U.S.C. section 360bbb-3(b)(1), unless the authorization is terminated or revoked sooner. Performed at [facility], [address] [place], [place] [address]     Radiology Reports CT CHEST WO CONTRAST  Result Date: 07/23/2019 CLINICAL DATA:  COVID-19 positive. Cough. EXAM: CT CHEST WITHOUT CONTRAST TECHNIQUE: Multidetector CT imaging of the chest was performed following the standard protocol without IV contrast. COMPARISON:  One-view chest x-ray 07/22/2019. CT heart 07/03/2019 FINDINGS: Cardiovascular: Atherosclerotic calcifications are noted in the LAD and right coronary artery. Heart size is normal. No significant pericardial effusion is present. Minimal vascular calcifications are present at the arch without aneurysm or definite stenosis. Mediastinum/Nodes: No significant mediastinal, hilar, or axillary adenopathy is present. The thoracic inlet is normal. Esophagus is unremarkable. Lungs/Pleura: Patchy bilateral airspace opacities are mostly peripheral and more prominent at the lung bases. No significant endobronchial disease is present. No significant pleural effusions or pneumothorax are present. Upper Abdomen: Cholecystectomy is noted. Limited imaging of the upper abdomen is otherwise unremarkable.  Musculoskeletal: Fused anterior osteophytes are present in the thoracic spine. Vertebral body heights and alignment are maintained. No focal lytic or blastic lesions are present. The ribs are within normal limits bilaterally. IMPRESSION: 1. Patchy bilateral airspace opacities are mostly peripheral and more prominent at the lung bases. This is consistent with multi lobar COVID pneumonia. 2. Atherosclerosis including coronary artery disease. 3. Fused anterior osteophytes in the thoracic spine compatible with diffuse idiopathic skeletal hyperostosis. Electronically Signed   By: San Morelle M.D.   On: 07/23/2019 07:03   CT CORONARY MORPH W/CTA COR W/SCORE W/CA W/CM &/OR WO/CM  Addendum Date: 07/03/2019   ADDENDUM REPORT: 07/03/2019 15:44 CLINICAL DATA:  16M with chest pain EXAM: Cardiac/Coronary CTA TECHNIQUE: The patient was scanned on a [employer]. FINDINGS: A 100 kV prospective scan was triggered in the descending thoracic  aorta at 111 HU's. Axial non-contrast 3 mm slices were carried out through the heart. The data set was analyzed on a dedicated work station and scored using the [place]. Gantry rotation speed was 250 msecs and collimation was .6 mm. No beta blockade and 0.8 mg of sl NTG was given. The 3D data set was reconstructed in 5% intervals of the 67-82 % of the R-R cycle. Diastolic phases were analyzed on a dedicated work station using MPR, MIP and VRT modes. The patient received 80 cc of contrast. Coronary Arteries:  Normal coronary origin.  Right dominance. RCA is a large dominant artery that gives rise to PDA and PLA. There is calcified plaque in the proximal and distal RCA causing minimal (0-24%) stenosis Left main is a large artery that gives rise to LAD and LCX arteries. There is calcified plaque in the distal left main causing minimal (0-24%) stenosis LAD is a large vessel. There is calcified plaque in the proximal and mid LAD causing minimal (0-24%) stenosis. 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 There is no plaque. Other findings: Left Ventricle: Normal size Left Atrium: Mild dilatation Pulmonary Veins: Normal configuration Right Ventricle: Normal size Right Atrium: Mild dilatation Cardiac valves: No calcifications Thoracic aorta: Normal size Pulmonary Arteries: Normal size Systemic Veins: Normal drainage Pericardium: Normal thickness IMPRESSION: 1. Coronary calcium score of 581. This was 92percentile for age and sex matched control. 2. Normal coronary origin with right dominance. 3. Nonobstructive CAD. Calcified plaque throughout LAD and RCA causing minimal (0-24%) stenosis 4. 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 Minimal non-obstructive CAD (0-24%). Consider non-atherosclerotic causes of chest pain. Consider preventive therapy and risk factor modification. Electronically Signed   By: ChOswaldo MilianD   On: 07/03/2019 15:44   Result Date: 07/03/2019 EXAM: OVER-READ INTERPRETATION  CT CHEST The following report is an over-read performed by radiologist Dr. [facility] [place] 07/03/2019. This over-read does not include interpretation of cardiac or coronary anatomy or pathology. The coronary calcium score/coronary CTA interpretation by the cardiologist is attached. COMPARISON:  07/02/2019 FINDINGS: Vascular: Ascending aortic caliber 3.5 cm. Descending thoracic aorta at 2.5 cm. Please see dedicated report regarding cardiac structures. Small pulmonary arterial to venous communication on images 112-140 tracking from lower lobe venous branches back to arterial branches in the pulmonary bed. No significant dilation of the vascular bed. Mediastinum/Nodes: Limited imaging of the mediastinum shows no signs of adenopathy. Limited imaging of the esophagus is normal. Lungs/Pleura: Basilar atelectasis. No signs of pleural effusion. Upper Abdomen: Incidental imaging of  upper abdominal contents is normal. Musculoskeletal: No signs of acute bone finding or destructive bone process. Spinal degenerative changes. IMPRESSION: 1. Mild thoracic aortic dilation. Recommend annual imaging followup by CTA or MRA. This recommendation follows 2010 ACCF/AHA/AATS/ACR/ASA/SCA/SCAI/SIR/STS/SVM Guidelines for the Diagnosis and Management of Patients with Thoracic Aortic Disease. Circulation.2010; 121: : J009-F818Aortic aneurysm NOS (ICD10-I71.9) 2. 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 Significance uncertain. Consider interventional radiology referral for further evaluation as clinically warranted. 3. Please see dedicated report regarding cardiac structures. Electronically Signed: By: GeZetta Bills.D. On: 07/03/2019 13:52   DG Chest Portable 1 View  Result Date: 07/22/2019 CLINICAL DATA:  Shortness of breath.  COVID positive. EXAM: PORTABLE CHEST 1 VIEW COMPARISON:  07/02/2019 FINDINGS: Heart size is normal. No pleural effusion or edema. Decreased lung volumes  with bibasilar atelectasis. No airspace consolidation. IMPRESSION: 1. Low lung volumes with bibasilar atelectasis Electronically Signed   By: Kerby Moors M.D.   On: 07/22/2019 10:08   DG Chest Port 1 View  Result Date: 07/02/2019 CLINICAL DATA:  Short of breath, weakness, fatigue EXAM: PORTABLE CHEST 1 VIEW COMPARISON:  11/27/2016 FINDINGS: The heart size and mediastinal contours are within normal limits. Both lungs are clear. The visualized skeletal structures are unremarkable. IMPRESSION: No active disease. Electronically Signed   By: Randa Ngo M.D.   On: 07/02/2019 15:00   ECHOCARDIOGRAM COMPLETE  Result Date: 07/03/2019    ECHOCARDIOGRAM REPORT   Patient Name:   ARLEY SALAMONE Date of Exam: 07/03/2019 Medical Rec #:  [id number]       Height:       69.0 in Accession #:    [id number]      Weight:       256.8 lb Date of Birth:  Aug 06, 1960       BSA:          2.30 m  Patient Age:    18 years        BP:           144/68 mmHg Patient Gender: M               HR:           78 bpm. Exam Location:  Inpatient Procedure: 2D Echo Indications:    Chest Pain R07.9  History:        Patient has prior history of Echocardiogram examinations, most                 recent 04/18/2016. CHF, COPD; Risk Factors:Hypertension,                 Diabetes and Dyslipidemia.  Sonographer:    Mikki Santee RDCS (AE) Referring Phys: [id number] [place]  1. Left ventricular ejection fraction, by estimation, is 65 to 70%. The left ventricle has normal function. The left ventrical has no regional wall motion abnormalities. Left ventricular diastolic parameters are indeterminate. Elevated left ventricular end-diastolic pressure.  2. Right ventricular systolic function is normal. The right ventricular size is mildly enlarged. Tricuspid regurgitation signal is inadequate for assessing PA pressure.  3. The mitral valve is normal in structure and function. no evidence of mitral valve regurgitation. No evidence of mitral stenosis.  4. The aortic valve is normal in structure and function. Aortic valve regurgitation is not visualized. No aortic stenosis is present.  5. The inferior vena cava is dilated in size with >50% respiratory variability, suggesting right atrial pressure of 8 mmHg. FINDINGS  Left Ventricle: Left ventricular ejection fraction, by estimation, is 65 to 70%. The left ventricle has normal function. The left ventricle has no regional wall motion abnormalities. The left ventricular internal cavity size was normal in size. There is  no left ventricular hypertrophy. Left ventricular diastolic parameters are indeterminate. Right Ventricle: The right ventricular size is mildly enlarged. No increase in right ventricular wall thickness. Right ventricular systolic function is normal. Tricuspid regurgitation signal is inadequate for assessing PA pressure. Left Atrium: Left atrial size was  normal in size. Right Atrium: Right atrial size was normal in size. Pericardium: There is no evidence of pericardial effusion. Mitral Valve: The mitral valve is normal in structure and function. Normal mobility of the mitral valve leaflets. No evidence of mitral valve regurgitation. No evidence of mitral valve stenosis. Tricuspid Valve: The tricuspid  valve is normal in structure. Tricuspid valve regurgitation is not demonstrated. No evidence of tricuspid stenosis. Aortic Valve: The aortic valve is normal in structure and function. Aortic valve regurgitation is not visualized. No aortic stenosis is present. Pulmonic Valve: The pulmonic valve was normal in structure. Pulmonic valve regurgitation is trivial. No evidence of pulmonic stenosis. Aorta: The aortic root is normal in size and structure. Venous: The inferior vena cava is dilated in size with greater than 50% respiratory variability, suggesting right atrial pressure of 8 mmHg. The inferior vena cava and the hepatic vein show a normal flow pattern. IAS/Shunts: No atrial level shunt detected by color flow Doppler.  LEFT VENTRICLE PLAX 2D LVIDd:         5.10 cm  Diastology LVIDs:         3.00 cm  LV e' lateral:   7.18 cm/s LV PW:         1.00 cm  LV E/e' lateral: 12.6 LV IVS:        1.00 cm  LV e' medial:    6.53 cm/s LVOT diam:     2.20 cm  LV E/e' medial:  13.9 LV SV:         98.83 ml LV SV Index:   36.54 LVOT Area:     3.80 cm  RIGHT VENTRICLE TAPSE (M-mode): 2.0 cm LEFT ATRIUM             Index       RIGHT ATRIUM           Index LA diam:        3.70 cm 1.61 cm/m  RA Area:     18.40 cm LA Vol (A2C):   75.8 ml 32.99 ml/m RA Volume:   48.80 ml  21.24 ml/m LA Vol (A4C):   61.4 ml 26.72 ml/m LA Biplane Vol: 68.9 ml 29.99 ml/m  AORTIC VALVE LVOT Vmax:   109.00 cm/s LVOT Vmean:  75.800 cm/s LVOT VTI:    0.260 m  AORTA Ao Root diam: 3.30 cm MITRAL VALVE MV Area (PHT): 3.42 cm             SHUNTS MV Decel Time: 222 msec             Systemic VTI:  0.26 m MV E  velocity: 90.80 cm/s 103 cm/s  Systemic Diam: 2.20 cm MV A velocity: 89.50 cm/s 70.3 cm/s MV E/A ratio:  1.01       1.5 Fransico Him MD Electronically signed by Fransico Him MD Signature Date/Time: 07/03/2019/10:40:08 AM    Final    CT Angio Chest/Abd/Pel for Dissection W and/or W/WO  Result Date: 07/02/2019 CLINICAL DATA:  Shortness of breath, weakness, fatigue.  Chest pain EXAM: CT ANGIOGRAPHY CHEST, ABDOMEN AND PELVIS TECHNIQUE: Multidetector CT imaging through the chest, abdomen and pelvis was performed using the standard protocol during bolus administration of intravenous contrast. Multiplanar reconstructed images and MIPs were obtained and reviewed to evaluate the vascular anatomy. CONTRAST:  160m OMNIPAQUE IOHEXOL 350 MG/ML SOLN COMPARISON:  09/26/2018 FINDINGS: CTA CHEST FINDINGS Cardiovascular: Heart is normal size. Aorta normal caliber. No dissection. No filling defects in the pulmonary arteries to suggest pulmonary emboli. Mediastinum/Nodes: No mediastinal, hilar, or axillary adenopathy. Trachea and esophagus are unremarkable. Thyroid unremarkable. Lungs/Pleura: Lungs are clear. No focal airspace opacities or suspicious nodules. No effusions. Musculoskeletal: No acute bony abnormality. Chest wall soft tissues unremarkable. Review of the MIP images confirms the above findings. CTA ABDOMEN AND PELVIS FINDINGS VASCULAR Aorta:  Normal caliber aorta without aneurysm, dissection, vasculitis or significant stenosis. Scattered calcifications in the infrarenal aorta. Celiac: Patent without evidence of aneurysm, dissection, vasculitis or significant stenosis. SMA: Patent without evidence of aneurysm, dissection, vasculitis or significant stenosis. Renals: Both renal arteries are patent without evidence of aneurysm, dissection, vasculitis, fibromuscular dysplasia or significant stenosis. IMA: Patent without evidence of aneurysm, dissection, vasculitis or significant stenosis. Inflow: Patent without evidence of  aneurysm, dissection, vasculitis or significant stenosis. Veins: No obvious venous abnormality within the limitations of this arterial phase study. Review of the MIP images confirms the above findings. NON-VASCULAR Hepatobiliary: Prior cholecystectomy.  No focal hepatic abnormality. Pancreas: No focal abnormality or ductal dilatation. Spleen: No focal abnormality.  Normal size. Adrenals/Urinary Tract: No adrenal abnormality. No focal renal abnormality. No stones or hydronephrosis. Urinary bladder is unremarkable. Stomach/Bowel: Normal appendix. Stomach, large and small bowel grossly unremarkable. Lymphatic: No adenopathy Reproductive: Prostate enlargement. Brachy therapy seeds in the region of the prostate. Other: No free fluid or free air. Musculoskeletal: No acute bony abnormality. Review of the MIP images confirms the above findings. IMPRESSION: No evidence of aortic aneurysm or dissection. Atherosclerotic calcifications in the infrarenal aorta. No acute cardiopulmonary disease. No acute findings in the abdomen or pelvis. Electronically Signed   By: Rolm Baptise M.D.   On: 07/02/2019 17:32

## 2019-07-24 NOTE — Progress Notes (Addendum)
Physical Therapy Evaluation  Patient presents with mild unsteadiness despite use of SPC. Recommend continued skilled PT services and continued daily mobility with nursing to progress patient back to his modified independent level. On room air during session, question if patient desats with ambulation as pleth not always good. Mild DOE noted. Oxygen saturation 89-90% at end of gait trial. Pending mobility progress while in the hospital, patient may need home PT upon discharge. Patient may need to get a [facility] or use his RW for mobility at time of discharge. He has a walking stick but not a single point cane.     07/24/19 0859  PT Visit Information  Last PT Received On 07/24/19  Assistance Needed +1  History of Present Illness 59 year old male admitted 07/22/19 with shortness of breath and sore throat.  He went to an urgent care center and was diagnosed with COVID-19 and since then has had progressively worsening dyspnea, chest tightness, dry cough, body aches. Dx: Covid 19 Viral pneumonia and gastroenteritis. Patient on steroids and IV Remdesivir. PMH: early-onset dementia, asthma, COPD, chronic bronchitis, diastolic CHF, NASH, hypertension, hyperlipidemia, OSA, CVA, type 2 diabetes   Precautions  Precautions Fall  Home Living  Family/patient expects to be discharged to: Private residence  Living Arrangements Spouse/significant other  Available Help at Discharge  (patient reports wife only had one day of symptomatic COVID)  Type of [place] to enter  Entrance Stairs-Number of Steps 1  Entrance Stairs-Rails Can reach both  Home Layout One level;Other (Comment) ("one step down to living room")  Print production planner Handicapped height  Bathroom Accessibility Yes  Home Equipment Shower seat - built in;Grab bars - tub/shower;Walker - 2 wheels (walking stick)  Additional Comments comfort height toilet, also owns RW that he has used in the past,  38f max ambulation distance at baseline (visits Mother a mile away 1-2 times/day)  Prior Function  Level of Independence Independent with assistive device(s)  Comments walking stick mostly used outside, he likes to be outside in the yard, inside the home furniture/wall walks, wife does cooking and cleaning, +drive, does not work due to disability  CEngineer, petroleumNo difficulties  Pain Assessment  Pain Assessment 0-10  Pain Score 5  Pain Location hips  Pain Descriptors / Indicators  (chronic pain)  Pain Intervention(s) Monitored during session;Limited activity within patient's tolerance (RN aware)  Cognition  Arousal/Alertness Awake/alert  Behavior During Therapy WFL for tasks assessed/performed  Overall Cognitive Status Within Functional Limits for tasks assessed  General Comments oriented to situation, place, time  Lower Extremity Assessment  Lower Extremity Assessment LLE deficits/detail;RLE deficits/detail  RLE Deficits / Details grossly 4/5 strength  RLE Sensation history of peripheral neuropathy (intact to light touch)  LLE Deficits / Details grossly 4/5 strength (notes prior stroke approx 5 years ago)  LLE Sensation history of peripheral neuropathy (intact to light touch)  Bed Mobility  Overal bed mobility Modified Independent  General bed mobility comments use of bedrail  Transfers  Overall transfer level Modified independent  Equipment used None;Straight cane  General transfer comment sit>stand from EOB without AD, sit<>stand [facility]iner chair [facility] Ambulation/Gait  Ambulation/Gait assistance Supervision;Min guard  Gait Distance (Feet) 60 Feet  Assistive device Straight cane ((intermittent use of railing in hallway))  Gait Pattern/deviations Step-through pattern  General Gait Details decreased  Gait velocity Patient reports feeling unsteady today.  Balance  Overall balance assessment Needs assistance  Sitting-balance support  No upper extremity  supported  Sitting balance-Leahy Scale Normal  Sitting balance - Comments sitting EOB to don socks independently  Standing balance support No upper extremity supported;Single extremity supported  Standing balance-Leahy Scale Good  Standing balance comment No overt LOB but intermittent use of railing in hallway along with SPC during mobility  General Comments  General comments (skin integrity, edema, etc.) On room air. Oxygen saturation at rest 97%. Appears to desat with ambulation down to mid 80s% (? pleth) 89%, back to 90s% at end of gait trial.   PT - End of Session  Activity Tolerance Patient limited by fatigue  Patient left in chair;with nursing/sitter in room  Nurse Communication Mobility status  PT Assessment  PT Recommendation/Assessment Patient needs continued PT services  PT Visit Diagnosis Unsteadiness on feet (R26.81);Other abnormalities of gait and mobility (R26.89)  PT Problem List Decreased strength;Decreased activity tolerance;Decreased balance;Decreased mobility;Cardiopulmonary status limiting activity  Barriers to Discharge Comments none anticipated  PT Plan  PT Frequency (ACUTE ONLY) Min 3X/week  PT Treatment/Interventions (ACUTE ONLY) DME instruction;Gait training;Stair training;Functional mobility training;Therapeutic activities;Therapeutic exercise;Balance training;Patient/family education  AM-PAC PT "6 Clicks" Mobility Outcome Measure (Version 2)  Help needed turning from your back to your side while in a flat bed without using bedrails? 4  Help needed moving from lying on your back to sitting on the side of a flat bed without using bedrails? 4  Help needed moving to and from a bed to a chair (including a wheelchair)? 4  Help needed standing up from a chair using your arms (e.g., wheelchair or bedside chair)? 4  Help needed to walk in hospital room? 3  Help needed climbing 3-5 steps with a railing?  3  6 Click Score 22  Consider Recommendation of Discharge To: Home  with no services  PT Recommendation  Follow Up Recommendations Home health PT ((pending mobility progression in the hospital))  PT equipment Cane;Rolling walker with 5" wheels (patient owns walking stick and RW if needed)  Individuals Consulted  Consulted and Agree with Results and Recommendations Patient  Acute Rehab PT Goals  Patient Stated Goal to be functional  PT Goal Formulation With patient  Time For Goal Achievement 08/06/19  Potential to Achieve Goals Good  PT Time Calculation  PT Start Time (ACUTE ONLY) 0859  PT Stop Time (ACUTE ONLY) 4401  PT Time Calculation (min) (ACUTE ONLY) 29 min  PT General Charges  $$ ACUTE PT VISIT 1 Visit  PT Evaluation  $PT Eval Moderate Complexity 1 Mod   Birdie Hopes, DPT, PT Acute Rehab [phone] office

## 2019-07-24 NOTE — TOC Initial Note (Addendum)
Transition of Care [facility]) - Initial/Assessment Note    Patient Details  Name: Bryan Becker MRN: [id number] Date of Birth: 08-01-1960  [facility] CM/SW Contact:    Maryclare Labrador, RN Phone Number: 07/24/2019, 4:44 PM  Clinical Narrative:  PTA independent from home.  Pt has PCP and denied barriers with paying for medications.  Pt interested in [facility] as recommended - CM provided medicare.gov HH list and pt did not have a preference.  Bryan Becker will accept pt pending orders - pt in agreement                 Expected Discharge Plan: [place] Barriers to Discharge: Barriers Resolved   Patient Goals and CMS Choice Patient states their goals for this hospitalization and ongoing recovery are:: Pt states she is ready to go home CMS Medicare.gov Compare Post Acute Care list provided to:: Patient Choice offered to / list presented to : Patient  Expected Discharge Plan and Services Expected Discharge Plan: [place]       Living arrangements for the past 2 months: Single Family Home Expected Discharge Date: 07/24/19                         HH Arranged: PT HH Agency: [place] Date [place]: 07/24/19 Time [place]: [id number] Representative spoke with at [place]: Tommi Rumps  Prior Living Arrangements/Services Living arrangements for the past 2 months: [place]   Patient language and need for interpreter reviewed:: Yes Do you feel safe going back to the place where you live?: Yes      Need for Family Participation in Patient Care: No (Comment) Care giver support system in place?: Yes (comment)   Criminal Activity/Legal Involvement Pertinent to Current Situation/Hospitalization: No - Comment as needed  Activities of Daily Living Home Assistive Devices/Equipment: Cane (specify quad or straight), CBG Meter, Eyeglasses, Oxygen, BIPAP ADL Screening (condition at time of admission) Patient's  cognitive ability adequate to safely complete daily activities?: Yes Is the patient deaf or have difficulty hearing?: No Does the patient have difficulty seeing, even when wearing glasses/contacts?: No Does the patient have difficulty concentrating, remembering, or making decisions?: No Patient able to express need for assistance with ADLs?: Yes Does the patient have difficulty dressing or bathing?: No Independently performs ADLs?: Yes (appropriate for developmental age) Does the patient have difficulty walking or climbing stairs?: No Weakness of Legs: None Weakness of Arms/Hands: None  Permission Sought/Granted   Permission granted to share information with : Yes, Verbal Permission Granted              Emotional Assessment   Attitude/Demeanor/Rapport: Gracious, Charismatic, Self-Confident, Engaged Affect (typically observed): Accepting, Adaptable Orientation: : Oriented to Self, Oriented to Place, Oriented to  Time, Oriented to Situation   Psych Involvement: No (comment)  Admission diagnosis:  Hypokalemia [E87.6] COVID-19 virus infection [U07.1] COVID-19 [U07.1] Patient Active Problem List   Diagnosis Date Noted  . COVID-19 virus infection 07/22/2019  . Acute respiratory failure with hypoxia ([place]) 07/22/2019  . Hypokalemia 07/22/2019  . Unstable angina ([place]) 07/02/2019  . Chest pain 07/02/2019  . Diastolic heart failure ([place]) 11/22/2016  . Memory difficulties 09/19/2016  . Polypharmacy 09/19/2016  . Pain of left upper extremity 08/15/2016  . Angiodysplasia of colon   . B12 deficiency 07/06/2016  . Depression, recurrent ([place]) 07/06/2016  . Chronic respiratory failure with hypoxia (HCC)/ noct  hypoxemia  05/28/2016  . Bloating 05/24/2016  . Dysphagia 05/24/2016  . Heme + stool 05/24/2016  . Rectal bleeding 05/24/2016  . History of ischemic stroke without residual deficits 04/21/2016  . Family history of abdominal aortic aneurysm (AAA) 04/21/2016  . Dyspnea,  unspecified 03/02/2016  . HNP (herniated nucleus pulposus), lumbar 06/18/2014  . Generalized abdominal pain 02/18/2014  . Nausea with vomiting 01/12/2014  . Diabetes mellitus type II, uncontrolled ([place]) 01/12/2014  . Ulcer of intestine 01/12/2014  . Weakness 12/04/2013  . Morbid (severe) obesity due to excess calories ([place]) 04/24/2013  . COPD GOLD  III 04/24/2013  . Fibromyalgia 04/24/2013  . Mixed hyperlipidemia 04/24/2013  . Headache(784.0) 04/24/2013  . OSA (obstructive sleep apnea) 04/24/2013  . Encounter for long-term (current) use of high-risk medication 04/24/2013  . NASH (nonalcoholic steatohepatitis) 11/20/2012  . Essential hypertension 11/20/2012   PCP:  Robert Bellow, PA-C Pharmacy:   [facility], [place] - [address] [place] [address] Phone: [phone] Fax: [phone]  [place], [place] [address] Phone: [phone] Fax: [phone]  [employer], [place] [address] Phone: [phone] Fax: [phone]     Social Determinants of Health (SDOH) Interventions    Readmission Risk Interventions No flowsheet data found.

## 2019-07-24 NOTE — Progress Notes (Signed)
Inpatient Diabetes Program Recommendations  AACE/ADA: New Consensus Statement on Inpatient Glycemic Control (2015)  Target Ranges:  Prepandial:   less than 140 mg/dL      Peak postprandial:   less than 180 mg/dL (1-2 hours)      Critically ill patients:  140 - 180 mg/dL   Lab Results  Component Value Date   GLUCAP 234 (H) 07/24/2019   HGBA1C 8.6 (H) 07/23/2019    Review of Glycemic Control Results for Bryan Becker, MINTZER (MRN [id number]) as of 07/24/2019 09:08  Ref. Range 07/23/2019 08:43 07/23/2019 11:00 07/23/2019 16:34 07/23/2019 21:17 07/24/2019 08:26  Glucose-Capillary Latest Ref Range: 70 - 99 mg/dL 238 (H) 206 (H) 280 (H) 294 (H) 234 (H)  Diabetes history: DM 2 Outpatient Diabetes medications: Jardiance 25 mg Daily, Amaryl 4 mg bid, Victoza 1.8 mg Daily (was taking Levemir 60 units, Novolog 20-30 units 4x/day at one time)  Current orders for Inpatient glycemic control:  Levemir 45 units Daily Novolog 0-15 units tid  Decadron 6 mg Daily  Inpatient Diabetes Program Recommendations:    Fasting glucose 234 mg/dl this am  -  Consider increasing Levemir to 52 units  -  Add Novolog bedtime correction scale   -  Add Novolog 6 units tid meal coverage if pt is eating at least 50% of meals.  Thanks,  Tama Headings RN, MSN, BC-ADM Inpatient Diabetes Coordinator Team Pager [phone] (8a-5p)

## 2019-07-25 LAB — COMPREHENSIVE METABOLIC PANEL
ALT: 14 U/L (ref 0–44)
AST: 14 U/L — ABNORMAL LOW (ref 15–41)
Albumin: 3 g/dL — ABNORMAL LOW (ref 3.5–5.0)
Alkaline Phosphatase: 26 U/L — ABNORMAL LOW (ref 38–126)
Anion gap: 11 (ref 5–15)
BUN: 18 mg/dL (ref 6–20)
CO2: 24 mmol/L (ref 22–32)
Calcium: 8.6 mg/dL — ABNORMAL LOW (ref 8.9–10.3)
Chloride: 105 mmol/L (ref 98–111)
Creatinine, Ser: 0.67 mg/dL (ref 0.61–1.24)
GFR calc Af Amer: >60 mL/min (ref >60)
GFR calc non Af Amer: >60 mL/min (ref >60)
Glucose, Bld: 201 mg/dL — ABNORMAL HIGH (ref 70–99)
Potassium: 3.8 mmol/L (ref 3.5–5.1)
Sodium: 140 mmol/L (ref 135–145)
Total Bilirubin: 0.5 mg/dL (ref 0.3–1.2)
Total Protein: 5.9 g/dL — ABNORMAL LOW (ref 6.5–8.1)

## 2019-07-25 LAB — CBC WITH DIFFERENTIAL/PLATELET
Abs Immature Granulocytes: 0.02 10*3/uL (ref 0.00–0.07)
Basophils Absolute: 0 10*3/uL (ref 0.0–0.1)
Basophils Relative: 0 %
Eosinophils Absolute: 0 10*3/uL (ref 0.0–0.5)
Eosinophils Relative: 0 %
HCT: 37.2 % — ABNORMAL LOW (ref 39.0–52.0)
Hemoglobin: 12.7 g/dL — ABNORMAL LOW (ref 13.0–17.0)
Immature Granulocytes: 0 %
Lymphocytes Relative: 26 %
Lymphs Abs: 1.5 10*3/uL (ref 0.7–4.0)
MCH: 30.2 pg (ref 26.0–34.0)
MCHC: 34.1 g/dL (ref 30.0–36.0)
MCV: 88.4 fL (ref 80.0–100.0)
Monocytes Absolute: 0.4 10*3/uL (ref 0.1–1.0)
Monocytes Relative: 6 %
Neutro Abs: 3.9 10*3/uL (ref 1.7–7.7)
Neutrophils Relative %: 68 %
Platelets: 193 10*3/uL (ref 150–400)
RBC: 4.21 MIL/uL — ABNORMAL LOW (ref 4.22–5.81)
RDW: 12.8 % (ref 11.5–15.5)
WBC: 5.9 10*3/uL (ref 4.0–10.5)
nRBC: 0 % (ref 0.0–0.2)

## 2019-07-25 LAB — LACTATE DEHYDROGENASE: LDH: 152 U/L (ref 98–192)

## 2019-07-25 LAB — D-DIMER, QUANTITATIVE: D-Dimer, Quant: 0.44 ug/mL-FEU (ref 0.00–0.50)

## 2019-07-25 LAB — GLUCOSE, CAPILLARY
Glucose-Capillary: 255 mg/dL — ABNORMAL HIGH (ref 70–99)
Glucose-Capillary: 331 mg/dL — ABNORMAL HIGH (ref 70–99)
Glucose-Capillary: 275 mg/dL — ABNORMAL HIGH (ref 70–99)

## 2019-07-25 LAB — C-REACTIVE PROTEIN: CRP: 1.9 mg/dL — ABNORMAL HIGH (ref <1.0)

## 2019-07-25 NOTE — Progress Notes (Signed)
PROGRESS NOTE                                                                                                                                                                                                             Patient Demographics:    Bryan Becker, is a 59 y.o. male, DOB - 08-Apr-1961, GXQ:[id number]  Outpatient Primary MD for the patient is Robert Bellow, PA-C   Admit date - 07/22/2019   LOS - 3  Chief Complaint  Patient presents with  . Shortness of Breath       Brief Narrative: Patient is a 59 y.o. male with PMHx of early onset dementia, COPD, chronic diastolic heart failure, NASH, HTN, HLD, CVA, DM-2 who presented with diarrhea, vomiting, shortness of breath-was found to have COVID-19 pneumonia.  Significant Events: 3/1>> admit to [facility]  Microbiology data: 3/1>> blood cultures: Negative    Subjective:    Keigen Caddell today feels much better-coughing is much less.  No nausea/vomiting-he feels much better.   Assessment  & Plan :   Covid 19 Viral pneumonia and gastroenteritis: Stable on room air-GI symptoms have resolved.  Coughing less this morning.  Inflammatory markers are downtrending.  Continue steroids/remdesivir.    Fever: afebrile  O2 requirements:  SpO2: 96 %   COVID-19 Labs: Recent Labs    07/23/19 0025 07/24/19 0427 07/25/19 0244  DDIMER 0.38 1.29* 0.44  LDH 198* 186 152  CRP 5.9* 5.4* 1.9*       Component Value Date/Time   BNP 35.3 07/22/2019 1008   BNP 5.4 03/17/2016 1020    Recent Labs  Lab 07/22/19 1008  PROCALCITON <0.10    Lab Results  Component Value Date   SARSCOV2NAA NEGATIVE 07/02/2019     COVID-19 Medications: Steroids: 3/1>> Remdesivir: 3/1>>  Prone/Incentive Spirometry: encouraged  incentive spirometry use 3-4/hour.  DVT Prophylaxis  :  Lovenox   COPD: Stable-continue bronchodilators  Chronic diastolic heart failure: Euvolemic on exam-since GI  symptoms have resolved-stable on Lasix.  Nonobstructive CAD: No anginal symptoms-recently evaluated by cardiology in February 21 during his most recent hospitalization.  Remains on Plavix.  History of [place]/cirrhosis: Stable for outpatient monitoring  HTN: Controlled-continue bisoprolol  DM-2 (A1c 8.6 on 07/23/2019) uncontrolled hyperglycemia due to steroids: CBGs still uncontrolled-Levemir increased to 52 units-we will get first dose today-remains on 4 units of  NovoLog with meals and on resistant SSI.  Follow and optimize.   Recent Labs    07/24/19 1208 07/24/19 1658 07/24/19 2101  GLUCAP 243* 280* 260*   HLD: Continue fenofibrate  Neuropathy: Continue Neurontin.  GERD: Continue PPI  Anxiety/depression: Stable continue Celexa and as needed Ativan  Dementia: Appears to be early dementia-continue Namenda.  Currently answers all my questions appropriately.  Obesity: Estimated body mass index is 38 kg/m as calculated from the following:   Height as of this encounter: 5' 9"  (1.753 m).   Weight as of this encounter: 116.7 kg.    Consults  :  None  Procedures  :  None ABG: No results found for: PHART, PCO2ART, PO2ART, HCO3, TCO2, ACIDBASEDEF, O2SAT  Vent Settings: N/A    Condition - Stable  Family Communication  : Spouse over the phone on 3/4  Code Status :  Full Code  Diet :  Diet Order            Diet heart healthy/carb modified Room service appropriate? Yes; Fluid consistency: Thin  Diet effective now               Disposition Plan  : Home with home health when medically stable-likely on 3/6.  Barriers to discharge: Complete 5 days of IV Remdesivir (not a candidate for outpatient remdesivir given dementia-and lack of reliable transportation to infusion center)  Antimicorbials  :    Anti-infectives (From admission, onward)   Start     Dose/Rate Route Frequency Ordered Stop   07/24/19 1000  remdesivir 100 mg in sodium chloride 0.9 % 100 mL IVPB     100  mg 200 mL/hr over 30 Minutes Intravenous Daily 07/22/19 2255 07/28/19 0959   07/23/19 1000  remdesivir 100 mg in sodium chloride 0.9 % 100 mL IVPB  Status:  Discontinued     100 mg 200 mL/hr over 30 Minutes Intravenous Daily 07/22/19 2233 07/22/19 2256   07/22/19 2359  remdesivir 100 mg in sodium chloride 0.9 % 100 mL IVPB     100 mg 200 mL/hr over 30 Minutes Intravenous Every 1 hr x 2 07/22/19 2255 07/23/19 0300   07/22/19 2245  remdesivir 200 mg in sodium chloride 0.9% 250 mL IVPB  Status:  Discontinued     200 mg 580 mL/hr over 30 Minutes Intravenous Once 07/22/19 2233 07/22/19 2256      Inpatient Medications  Scheduled Meds: . vitamin C  500 mg Oral Daily  . bisoprolol  5 mg Oral BID  . cholecalciferol  1,000 Units Oral Daily  . citalopram  40 mg Oral Daily  . clopidogrel  75 mg Oral Daily  . dexamethasone (DECADRON) injection  6 mg Intravenous Daily  . enoxaparin (LOVENOX) injection  60 mg Subcutaneous Daily  . famotidine  20 mg Oral BID  . fenofibrate  160 mg Oral Daily  . finasteride  5 mg Oral Daily  . furosemide  40 mg Oral Daily  . gabapentin  300 mg Oral TID  . insulin aspart  0-20 Units Subcutaneous TID WC  . insulin aspart  4 Units Subcutaneous TID WC  . insulin detemir  52 Units Subcutaneous Daily  . memantine  10 mg Oral BID  . pantoprazole  40 mg Oral Daily  . pregabalin  75 mg Oral BID  . sucralfate  1 g Oral TID WC & HS  . umeclidinium-vilanterol  1 puff Inhalation Daily  . zinc sulfate  220 mg Oral Daily   Continuous Infusions: .  remdesivir 100 mg in NS 100 mL Stopped (07/24/19 1200)   PRN Meds:.acetaminophen, chlorpheniramine-HYDROcodone, guaiFENesin, Ipratropium-Albuterol, loperamide, LORazepam   Time Spent in minutes  25  See all Orders from today for further details   Oren Binet M.D on 07/25/2019 at 11:32 AM  To page go to [email or web address] - use universal password  [facility] -  Office  [phone]    Objective:   Vitals:    07/24/19 1219 07/24/19 1946 07/24/19 2102 07/25/19 0421  BP: 113/75 129/65    Pulse: 73     Resp: 17 18    Temp: 98.6 F (37 C) 98 F (36.7 C) 98.3 F (36.8 C) 97.6 F (36.4 C)  TempSrc: Oral Oral Oral Oral  SpO2: 96%     Weight:      Height:        Wt Readings from Last 3 Encounters:  07/22/19 116.7 kg  07/03/19 116.5 kg  02/01/17 130.9 kg     Intake/Output Summary (Last 24 hours) at 07/25/2019 1132 Last data filed at 07/25/2019 0300 Gross per 24 hour  Intake 360 ml  Output 2400 ml  Net -2040 ml     Physical Exam Gen Exam:Alert awake-not in any distress HEENT:atraumatic, normocephalic Chest: B/L clear to auscultation anteriorly CVS:S1S2 regular Abdomen:soft non tender, non distended Extremities:no edema Neurology: Non focal Skin: no rash   Data Review:    CBC Recent Labs  Lab 07/22/19 1008 07/23/19 0025 07/24/19 0427 07/25/19 0244  WBC 6.1 6.5 5.0 5.9  HGB 13.6 14.0 13.5 12.7*  HCT 41.6 42.2 41.8 37.2*  PLT 163 171 192 193  MCV 90.2 90.0 90.1 88.4  MCH 29.5 29.9 29.1 30.2  MCHC 32.7 33.2 32.3 34.1  RDW 13.0 13.1 12.9 12.8  LYMPHSABS 1.4 1.4 1.0 1.5  MONOABS 0.4 0.3 0.1 0.4  EOSABS 0.0 0.0 0.0 0.0  BASOSABS 0.0 0.0 0.0 0.0    Chemistries  Recent Labs  Lab 07/22/19 1008 07/23/19 0025 07/24/19 0427 07/25/19 0244  NA 138 138 139 140  K 2.8* 3.3* 4.2 3.8  CL 101 101 103 105  CO2 26 24 25 24   GLUCOSE 106* 94 251* 201*  BUN 17 12 18 18   CREATININE 0.86 0.81 0.65 0.67  CALCIUM 9.0 8.8* 9.2 8.6*  MG  --  1.9  --   --   AST 17 19 15  14*  ALT 14 14 14 14   ALKPHOS 29* 28* 25* 26*  BILITOT 0.4 0.9 0.6 0.5   ------------------------------------------------------------------------------------------------------------------ No results for input(s): CHOL, HDL, LDLCALC, TRIG, CHOLHDL, LDLDIRECT in the last 72 hours.  Lab Results  Component Value Date   HGBA1C 8.6 (H) 07/23/2019    ------------------------------------------------------------------------------------------------------------------ No results for input(s): TSH, T4TOTAL, T3FREE, THYROIDAB in the last 72 hours.  Invalid input(s): FREET3 ------------------------------------------------------------------------------------------------------------------ No results for input(s): VITAMINB12, FOLATE, FERRITIN, TIBC, IRON, RETICCTPCT in the last 72 hours.  Coagulation profile Recent Labs  Lab 07/22/19 1008  INR 1.0    Recent Labs    07/24/19 0427 07/25/19 0244  DDIMER 1.29* 0.44    Cardiac Enzymes No results for input(s): CKMB, TROPONINI, MYOGLOBIN in the last 168 hours.  Invalid input(s): CK ------------------------------------------------------------------------------------------------------------------    Component Value Date/Time   BNP 35.3 07/22/2019 1008   BNP 5.4 03/17/2016 1020    Micro Results Recent Results (from the past 240 hour(s))  Culture, blood (Routine x 2)     Status: None (Preliminary result)   Collection Time: 07/22/19 10:08 AM   Specimen: BLOOD  Result  Value Ref Range Status   Specimen Description   Final    BLOOD RIGHT ANTECUBITAL Performed at [facility], [address] [place], [place] [address]    Special Requests   Final    BOTTLES DRAWN AEROBIC AND ANAEROBIC Blood Culture adequate volume Performed at [facility], [address] [place], [place] [address]    Culture   Final    NO GROWTH 3 DAYS Performed at [facility], [place] [address] [place], [place] [address]    Report Status PENDING  Incomplete  Culture, blood (Routine x 2)     Status: None (Preliminary result)   Collection Time: 07/22/19 10:15 AM   Specimen: BLOOD  Result Value Ref Range Status   Specimen Description   Final    BLOOD LEFT ANTECUBITAL Performed at [facility], [address] [place], [place] [address]    Special Requests   Final     BOTTLES DRAWN AEROBIC AND ANAEROBIC Blood Culture adequate volume Performed at [facility], [address] [place], [place] [address]    Culture   Final    NO GROWTH 3 DAYS Performed at [facility], [place] [address] [place], [place] [address]    Report Status PENDING  Incomplete  SARS Coronavirus 2 Ag (30 min TAT) -     Status: Abnormal   Collection Time: 07/22/19 10:25 AM   Specimen: Nasal Swab  Result Value Ref Range Status   SARS Coronavirus 2 Ag POSITIVE (A) NEGATIVE Final    Comment: RESULT CALLED TO, READ BACK BY AND VERIFIED WITH: CALLED TO M.SIMS ON [id number] AT 1130 BY SROY (NOTE) SARS-CoV-2 antigen PRESENT. Positive results indicate the presence of viral antigens, but clinical correlation with patient history and other diagnostic information is necessary to determine patient infection status.  Positive results do not rule out bacterial infection or co-infection  with other viruses. False positive results are rare but can occur, and confirmatory RT-PCR testing may be appropriate in some circumstances. The expected result is Negative. Fact Sheet for Patients: PodPark.tn Fact Sheet for Providers: GiftContent.is  This test is not yet approved or cleared by the [place] FDA and  has been authorized for detection and/or diagnosis of SARS-CoV-2 by FDA under an Emergency Use Authorization (EUA).  This EUA will remain in effect (meaning this test can be used) for the duration of  the COVID-19  declaration under Section 564(b)(1) of the Act, 21 U.S.C. section 360bbb-3(b)(1), unless the authorization is terminated or revoked sooner. Performed at [facility], [address], [place], [place] [address]     Radiology Reports CT CHEST WO CONTRAST  Result Date: 07/23/2019 CLINICAL DATA:  COVID-19 positive. Cough. EXAM: CT CHEST WITHOUT CONTRAST TECHNIQUE: Multidetector CT imaging of the chest  was performed following the standard protocol without IV contrast. COMPARISON:  One-view chest x-ray 07/22/2019. CT heart 07/03/2019 FINDINGS: Cardiovascular: Atherosclerotic calcifications are noted in the LAD and right coronary artery. Heart size is normal. No significant pericardial effusion is present. Minimal vascular calcifications are present at the arch without aneurysm or definite stenosis. Mediastinum/Nodes: No significant mediastinal, hilar, or axillary adenopathy is present. The thoracic inlet is normal. Esophagus is unremarkable. Lungs/Pleura: Patchy bilateral airspace opacities are mostly peripheral and more prominent at the lung bases. No significant endobronchial disease is present. No significant pleural effusions or pneumothorax are present. Upper Abdomen: Cholecystectomy is noted. Limited imaging of the  upper abdomen is otherwise unremarkable. Musculoskeletal: Fused anterior osteophytes are present in the thoracic spine. Vertebral body heights and alignment are maintained. No focal lytic or blastic lesions are present. The ribs are within normal limits bilaterally. IMPRESSION: 1. Patchy bilateral airspace opacities are mostly peripheral and more prominent at the lung bases. This is consistent with multi lobar COVID pneumonia. 2. Atherosclerosis including coronary artery disease. 3. Fused anterior osteophytes in the thoracic spine compatible with diffuse idiopathic skeletal hyperostosis. Electronically Signed   By: San Morelle M.D.   On: 07/23/2019 07:03   CT CORONARY MORPH W/CTA COR W/SCORE W/CA W/CM &/OR WO/CM  Addendum Date: 07/03/2019   ADDENDUM REPORT: 07/03/2019 15:44 CLINICAL DATA:  5M with chest pain EXAM: Cardiac/Coronary CTA TECHNIQUE: The patient was scanned on a [employer]. FINDINGS: A 100 kV prospective scan was triggered in the descending thoracic aorta at 111 HU's. Axial non-contrast 3 mm slices were carried out through the heart. The data set was analyzed  on a dedicated work station and scored using the Pierce. Gantry rotation speed was 250 msecs and collimation was .6 mm. No beta blockade and 0.8 mg of sl NTG was given. The 3D data set was reconstructed in 5% intervals of the 67-82 % of the R-R cycle. Diastolic phases were analyzed on a dedicated work station using MPR, MIP and VRT modes. The patient received 80 cc of contrast. Coronary Arteries:  Normal coronary origin.  Right dominance. RCA is a large dominant artery that gives rise to PDA and PLA. There is calcified plaque in the proximal and distal RCA causing minimal (0-24%) stenosis Left main is a large artery that gives rise to LAD and LCX arteries. There is calcified plaque in the distal left main causing minimal (0-24%) stenosis LAD is a large vessel. There is calcified plaque in the proximal and mid LAD causing minimal (0-24%) stenosis. 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 There is no plaque. Other findings: Left Ventricle: Normal size Left Atrium: Mild dilatation Pulmonary Veins: Normal configuration Right Ventricle: Normal size Right Atrium: Mild dilatation Cardiac valves: No calcifications Thoracic aorta: Normal size Pulmonary Arteries: Normal size Systemic Veins: Normal drainage Pericardium: Normal thickness IMPRESSION: 1. Coronary calcium score of 581. This was 982percentile for age and sex matched control. 2. Normal coronary origin with right dominance. 3. Nonobstructive CAD. Calcified plaque throughout LAD and RCA causing minimal (0-24%) stenosis 4. 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 Minimal non-obstructive CAD (0-24%). Consider non-atherosclerotic causes of chest pain. Consider preventive therapy and risk factor modification. Electronically Signed   By: ChOswaldo MilianD   On: 07/03/2019 15:44   Result Date: 07/03/2019 EXAM: OVER-READ INTERPRETATION  CT CHEST The following report is an  over-read performed by radiologist Dr. GeZetta Billsf [facility], PAVan Burenn 07/03/2019. This over-read does not include interpretation of cardiac or coronary anatomy or pathology. The coronary calcium score/coronary CTA interpretation by the cardiologist is attached. COMPARISON:  07/02/2019 FINDINGS: Vascular: Ascending aortic caliber 3.5 cm. Descending thoracic aorta at 2.5 cm. Please see dedicated report regarding cardiac structures. Small pulmonary arterial to venous communication on images 112-140 tracking from lower lobe venous branches back to arterial branches in the pulmonary bed. No significant dilation of the vascular bed. Mediastinum/Nodes: Limited imaging of the mediastinum shows no signs of adenopathy. Limited imaging of the esophagus is normal. Lungs/Pleura: Basilar atelectasis. No signs of pleural  effusion. Upper Abdomen: Incidental imaging of upper abdominal contents is normal. Musculoskeletal: No signs of acute bone finding or destructive bone process. Spinal degenerative changes. IMPRESSION: 1. Mild thoracic aortic dilation. Recommend annual imaging followup by CTA or MRA. This recommendation follows 2010 ACCF/AHA/AATS/ACR/ASA/SCA/SCAI/SIR/STS/SVM Guidelines for the Diagnosis and Management of Patients with Thoracic Aortic Disease. Circulation.2010; 121: Q734-L937. Aortic aneurysm NOS (ICD10-I71.9) 2. 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 Significance uncertain. Consider interventional radiology referral for further evaluation as clinically warranted. 3. Please see dedicated report regarding cardiac structures. Electronically Signed: By: Zetta Bills M.D. On: 07/03/2019 13:52   DG Chest Portable 1 View  Result Date: 07/22/2019 CLINICAL DATA:  Shortness of breath.  COVID positive. EXAM: PORTABLE CHEST 1 VIEW COMPARISON:  07/02/2019 FINDINGS: Heart size is normal. No pleural effusion or edema. Decreased lung volumes with bibasilar  atelectasis. No airspace consolidation. IMPRESSION: 1. Low lung volumes with bibasilar atelectasis Electronically Signed   By: Kerby Moors M.D.   On: 07/22/2019 10:08   DG Chest Port 1 View  Result Date: 07/02/2019 CLINICAL DATA:  Short of breath, weakness, fatigue EXAM: PORTABLE CHEST 1 VIEW COMPARISON:  11/27/2016 FINDINGS: The heart size and mediastinal contours are within normal limits. Both lungs are clear. The visualized skeletal structures are unremarkable. IMPRESSION: No active disease. Electronically Signed   By: Randa Ngo M.D.   On: 07/02/2019 15:00   ECHOCARDIOGRAM COMPLETE  Result Date: 07/03/2019    ECHOCARDIOGRAM REPORT   Patient Name:   Bryan Becker Date of Exam: 07/03/2019 Medical Rec #:  [id number]       Height:       69.0 in Accession #:    [id number]      Weight:       256.8 lb Date of Birth:  [birth year]       BSA:          2.30 m Patient Age:    13 years        BP:           144/68 mmHg Patient Gender: M               HR:           78 bpm. Exam Location:  Inpatient Procedure: 2D Echo Indications:    Chest Pain R07.9  History:        Patient has prior history of Echocardiogram examinations, most                 recent 04/18/2016. CHF, COPD; Risk Factors:Hypertension,                 Diabetes and Dyslipidemia.  Sonographer:    Mikki Santee RDCS (AE) Referring Phys: [id number] [place]  1. Left ventricular ejection fraction, by estimation, is 65 to 70%. The left ventricle has normal function. The left ventrical has no regional wall motion abnormalities. Left ventricular diastolic parameters are indeterminate. Elevated left ventricular end-diastolic pressure.  2. Right ventricular systolic function is normal. The right ventricular size is mildly enlarged. Tricuspid regurgitation signal is inadequate for assessing PA pressure.  3. The mitral valve is normal in structure and function. no evidence of mitral valve regurgitation. No evidence of mitral stenosis.  4. The  aortic valve is normal in structure and function. Aortic valve regurgitation is not visualized. No aortic stenosis is present.  5. The inferior vena cava is dilated in size with >50% respiratory variability, suggesting  right atrial pressure of 8 mmHg. FINDINGS  Left Ventricle: Left ventricular ejection fraction, by estimation, is 65 to 70%. The left ventricle has normal function. The left ventricle has no regional wall motion abnormalities. The left ventricular internal cavity size was normal in size. There is  no left ventricular hypertrophy. Left ventricular diastolic parameters are indeterminate. Right Ventricle: The right ventricular size is mildly enlarged. No increase in right ventricular wall thickness. Right ventricular systolic function is normal. Tricuspid regurgitation signal is inadequate for assessing PA pressure. Left Atrium: Left atrial size was normal in size. Right Atrium: Right atrial size was normal in size. Pericardium: There is no evidence of pericardial effusion. Mitral Valve: The mitral valve is normal in structure and function. Normal mobility of the mitral valve leaflets. No evidence of mitral valve regurgitation. No evidence of mitral valve stenosis. Tricuspid Valve: The tricuspid valve is normal in structure. Tricuspid valve regurgitation is not demonstrated. No evidence of tricuspid stenosis. Aortic Valve: The aortic valve is normal in structure and function. Aortic valve regurgitation is not visualized. No aortic stenosis is present. Pulmonic Valve: The pulmonic valve was normal in structure. Pulmonic valve regurgitation is trivial. No evidence of pulmonic stenosis. Aorta: The aortic root is normal in size and structure. Venous: The inferior vena cava is dilated in size with greater than 50% respiratory variability, suggesting right atrial pressure of 8 mmHg. The inferior vena cava and the hepatic vein show a normal flow pattern. IAS/Shunts: No atrial level shunt detected by color flow  Doppler.  LEFT VENTRICLE PLAX 2D LVIDd:         5.10 cm  Diastology LVIDs:         3.00 cm  LV e' lateral:   7.18 cm/s LV PW:         1.00 cm  LV E/e' lateral: 12.6 LV IVS:        1.00 cm  LV e' medial:    6.53 cm/s LVOT diam:     2.20 cm  LV E/e' medial:  13.9 LV SV:         98.83 ml LV SV Index:   36.54 LVOT Area:     3.80 cm  RIGHT VENTRICLE TAPSE (M-mode): 2.0 cm LEFT ATRIUM             Index       RIGHT ATRIUM           Index LA diam:        3.70 cm 1.61 cm/m  RA Area:     18.40 cm LA Vol (A2C):   75.8 ml 32.99 ml/m RA Volume:   48.80 ml  21.24 ml/m LA Vol (A4C):   61.4 ml 26.72 ml/m LA Biplane Vol: 68.9 ml 29.99 ml/m  AORTIC VALVE LVOT Vmax:   109.00 cm/s LVOT Vmean:  75.800 cm/s LVOT VTI:    0.260 m  AORTA Ao Root diam: 3.30 cm MITRAL VALVE MV Area (PHT): 3.42 cm             SHUNTS MV Decel Time: 222 msec             Systemic VTI:  0.26 m MV E velocity: 90.80 cm/s 103 cm/s  Systemic Diam: 2.20 cm MV A velocity: 89.50 cm/s 70.3 cm/s MV E/A ratio:  1.01       1.5 Fransico Him MD Electronically signed by Fransico Him MD Signature Date/Time: 07/03/2019/10:40:08 AM    Final    CT Angio Chest/Abd/Pel for Dissection W  and/or W/WO  Result Date: 07/02/2019 CLINICAL DATA:  Shortness of breath, weakness, fatigue.  Chest pain EXAM: CT ANGIOGRAPHY CHEST, ABDOMEN AND PELVIS TECHNIQUE: Multidetector CT imaging through the chest, abdomen and pelvis was performed using the standard protocol during bolus administration of intravenous contrast. Multiplanar reconstructed images and MIPs were obtained and reviewed to evaluate the vascular anatomy. CONTRAST:  154m OMNIPAQUE IOHEXOL 350 MG/ML SOLN COMPARISON:  09/26/2018 FINDINGS: CTA CHEST FINDINGS Cardiovascular: Heart is normal size. Aorta normal caliber. No dissection. No filling defects in the pulmonary arteries to suggest pulmonary emboli. Mediastinum/Nodes: No mediastinal, hilar, or axillary adenopathy. Trachea and esophagus are unremarkable. Thyroid unremarkable.  Lungs/Pleura: Lungs are clear. No focal airspace opacities or suspicious nodules. No effusions. Musculoskeletal: No acute bony abnormality. Chest wall soft tissues unremarkable. Review of the MIP images confirms the above findings. CTA ABDOMEN AND PELVIS FINDINGS VASCULAR Aorta: Normal caliber aorta without aneurysm, dissection, vasculitis or significant stenosis. Scattered calcifications in the infrarenal aorta. Celiac: Patent without evidence of aneurysm, dissection, vasculitis or significant stenosis. SMA: Patent without evidence of aneurysm, dissection, vasculitis or significant stenosis. Renals: Both renal arteries are patent without evidence of aneurysm, dissection, vasculitis, fibromuscular dysplasia or significant stenosis. IMA: Patent without evidence of aneurysm, dissection, vasculitis or significant stenosis. Inflow: Patent without evidence of aneurysm, dissection, vasculitis or significant stenosis. Veins: No obvious venous abnormality within the limitations of this arterial phase study. Review of the MIP images confirms the above findings. NON-VASCULAR Hepatobiliary: Prior cholecystectomy.  No focal hepatic abnormality. Pancreas: No focal abnormality or ductal dilatation. Spleen: No focal abnormality.  Normal size. Adrenals/Urinary Tract: No adrenal abnormality. No focal renal abnormality. No stones or hydronephrosis. Urinary bladder is unremarkable. Stomach/Bowel: Normal appendix. Stomach, large and small bowel grossly unremarkable. Lymphatic: No adenopathy Reproductive: Prostate enlargement. Brachy therapy seeds in the region of the prostate. Other: No free fluid or free air. Musculoskeletal: No acute bony abnormality. Review of the MIP images confirms the above findings. IMPRESSION: No evidence of aortic aneurysm or dissection. Atherosclerotic calcifications in the infrarenal aorta. No acute cardiopulmonary disease. No acute findings in the abdomen or pelvis. Electronically Signed   By: KRolm Baptise M.D.   On: 07/02/2019 17:32

## 2019-07-25 NOTE — Progress Notes (Signed)
Physical Therapy Treatment Patient Details Name: Bryan Becker MRN: [id number] DOB: 11-Oct-1960 Today's Date: 07/25/2019    History of Present Illness 59 year old male admitted 07/22/19 with shortness of breath and sore throat.  He went to an urgent care center and was diagnosed with COVID-19 and since then has had progressively worsening dyspnea, chest tightness, dry cough, body aches. Dx: Covid 19 Viral pneumonia and gastroenteritis. Patient on steroids and IV Remdesivir. PMH: early-onset dementia, asthma, COPD, chronic bronchitis, diastolic CHF, NASH, hypertension, hyperlipidemia, OSA, CVA, type 2 diabetes    PT Comments    Patient with improved mobility today tolerating great distance ambulation with light HHA. SPC unavailable at this time for gait trial (it was being used by another patient). Unable to initiate step training as step also being used by another patient at this time. Patient does have rail(s) at both single steps that he has to negotiate. Anticipate patient will be able to negotiate step with supervision and UE support.    Follow Up Recommendations  Home health PT     Equipment Recommendations  Cane       Precautions / Restrictions Precautions Precautions: Fall    Mobility  Bed Mobility    General bed mobility comments: OOB in chair upon PT arrival  Transfers Overall transfer level: Independent Equipment used: None General transfer comment: sit<>stand from recliner chair x 2 trials  Ambulation/Gait Ambulation/Gait assistance: Min guard Gait Distance (Feet): 75 Feet Assistive device: 1 person hand held assist((SPC not available at this time for ambulation)) Gait Pattern/deviations: Step-through pattern Gait velocity: decreased   General Gait Details: Improved balance compared to yesterday. SPC unavailable at this time for gait trial. Light HHA provided instead. No overt LOB. Oxygen and HR stable.   Stairs Stairs: (step being used by another therapist at  this time)     Balance   Sitting-balance support: No upper extremity supported Sitting balance-Leahy Scale: Normal Sitting balance - Comments: able to don socks sitting in chair   Standing balance support: No upper extremity supported;Single extremity supported Standing balance-Leahy Scale: Good Standing balance comment: Improved standing balance compared to yesterday but still requires light unilat UE support intermittently.     Cognition Arousal/Alertness: Awake/alert Behavior During Therapy: WFL for tasks assessed/performed Overall Cognitive Status: Within Functional Limits for tasks assessed     Exercises Other Exercises Other Exercises: incentive spirometer 2x 3 trials, max 600    General Comments General comments (skin integrity, edema, etc.): On room air, oxygen and HR stable during session. Education on discharge plan. Eduation on energy conservation techniques. Packet provided. Seated therex packet provided as well. Patient familiar with many of these techniques and exercises from his rehab after his stroke a few years ago.      Pertinent Vitals/Pain Pain Assessment: (no complaints of pain during session, has chronic hip pain)           PT Goals (current goals can now be found in the care plan section) Progress towards PT goals: Progressing toward goals    Frequency    Min 3X/week      PT Plan Current plan remains appropriate       AM-PAC PT "6 Clicks" Mobility   Outcome Measure  Help needed turning from your back to your side while in a flat bed without using bedrails?: None Help needed moving from lying on your back to sitting on the side of a flat bed without using bedrails?: None Help needed moving to and from a  bed to a chair (including a wheelchair)?: None Help needed standing up from a chair using your arms (e.g., wheelchair or bedside chair)?: None Help needed to walk in hospital room?: A Little Help needed climbing 3-5 steps with a railing? :  A Little 6 Click Score: 22    End of Session   Activity Tolerance: Patient tolerated treatment well Patient left: in chair;with call bell/phone within reach Nurse Communication: Mobility status PT Visit Diagnosis: Unsteadiness on feet (R26.81);Other abnormalities of gait and mobility (R26.89)     Time: 0740-9796 PT Time Calculation (min) (ACUTE ONLY): 25 min  Charges:  $Gait Training: 8-22 mins $Therapeutic Activity: 8-22 mins                     Birdie Hopes, DPT, PT Acute Rehab [phone] office     Birdie Hopes 07/25/2019, 10:24 AM

## 2019-07-25 NOTE — Progress Notes (Signed)
Inpatient Diabetes Program Recommendations  AACE/ADA: New Consensus Statement on Inpatient Glycemic Control (2015)  Target Ranges:  Prepandial:   less than 140 mg/dL      Peak postprandial:   less than 180 mg/dL (1-2 hours)      Critically ill patients:  140 - 180 mg/dL   Lab Results  Component Value Date   GLUCAP 255 (H) 07/25/2019   HGBA1C 8.6 (H) 07/23/2019    Review of Glycemic Control Results for GURTAJ, RUZ (MRN [id number]) as of 07/25/2019 12:47  Ref. Range 07/24/2019 08:26 07/24/2019 12:08 07/24/2019 16:58 07/24/2019 21:01 07/25/2019 12:13  Glucose-Capillary Latest Ref Range: 70 - 99 mg/dL 234 (H) 243 (H) 280 (H) 260 (H) 255 (H)  Results for CLEVESTER, HELZER (MRN [id number]) as of 07/25/2019 12:47  Ref. Range 07/25/2019 02:44  Glucose Latest Ref Range: 70 - 99 mg/dL 201 (H)  Diabetes history: DM 2 Outpatient Diabetes medications: Jardiance 25 mg Daily, Amaryl 4 mg bid, Victoza 1.8 mg Daily (was taking Levemir 60 units, Novolog 20-30 units 4x/day at one time)  Current orders for Inpatient glycemic control:  Levemir 52 units Daily Novolog 0-15 units tid Novolog 4 units tid meal coverage  Decadron 6 mg Daily  Inpatient Diabetes Program Recommendations:    Fasting glucose 201 mg/dl this am in labs  -  Consider increasing Levemir to 55 units  -  Add Novolog bedtime correction scale   -  Increase meal coverage to Novolog 6 units tid if pt is eating at least 50% of meals.  Thanks,  Tama Headings RN, MSN, BC-ADM Inpatient Diabetes Coordinator Team Pager [phone] (8a-5p)

## 2019-07-26 ENCOUNTER — Ambulatory Visit: Payer: Medicare HMO | Admitting: Cardiology

## 2019-07-26 LAB — CBC WITH DIFFERENTIAL/PLATELET
Abs Immature Granulocytes: 0.04 10*3/uL (ref 0.00–0.07)
Basophils Absolute: 0 10*3/uL (ref 0.0–0.1)
Basophils Relative: 0 %
Eosinophils Absolute: 0 10*3/uL (ref 0.0–0.5)
Eosinophils Relative: 1 %
HCT: 40.8 % (ref 39.0–52.0)
Hemoglobin: 13.5 g/dL (ref 13.0–17.0)
Immature Granulocytes: 1 %
Lymphocytes Relative: 35 %
Lymphs Abs: 2 10*3/uL (ref 0.7–4.0)
MCH: 29.3 pg (ref 26.0–34.0)
MCHC: 33.1 g/dL (ref 30.0–36.0)
MCV: 88.7 fL (ref 80.0–100.0)
Monocytes Absolute: 0.5 10*3/uL (ref 0.1–1.0)
Monocytes Relative: 9 %
Neutro Abs: 3.2 10*3/uL (ref 1.7–7.7)
Neutrophils Relative %: 54 %
Platelets: 207 10*3/uL (ref 150–400)
RBC: 4.6 MIL/uL (ref 4.22–5.81)
RDW: 12.7 % (ref 11.5–15.5)
WBC: 5.8 10*3/uL (ref 4.0–10.5)
nRBC: 0 % (ref 0.0–0.2)

## 2019-07-26 LAB — COMPREHENSIVE METABOLIC PANEL
ALT: 20 U/L (ref 0–44)
AST: 16 U/L (ref 15–41)
Albumin: 3.1 g/dL — ABNORMAL LOW (ref 3.5–5.0)
Alkaline Phosphatase: 30 U/L — ABNORMAL LOW (ref 38–126)
Anion gap: 11 (ref 5–15)
BUN: 15 mg/dL (ref 6–20)
CO2: 25 mmol/L (ref 22–32)
Calcium: 8.5 mg/dL — ABNORMAL LOW (ref 8.9–10.3)
Chloride: 104 mmol/L (ref 98–111)
Creatinine, Ser: 0.61 mg/dL (ref 0.61–1.24)
GFR calc Af Amer: >60 mL/min (ref >60)
GFR calc non Af Amer: >60 mL/min (ref >60)
Glucose, Bld: 200 mg/dL — ABNORMAL HIGH (ref 70–99)
Potassium: 3.6 mmol/L (ref 3.5–5.1)
Sodium: 140 mmol/L (ref 135–145)
Total Bilirubin: 0.5 mg/dL (ref 0.3–1.2)
Total Protein: 6.1 g/dL — ABNORMAL LOW (ref 6.5–8.1)

## 2019-07-26 LAB — GLUCOSE, CAPILLARY
Glucose-Capillary: 152 mg/dL — ABNORMAL HIGH (ref 70–99)
Glucose-Capillary: 192 mg/dL — ABNORMAL HIGH (ref 70–99)
Glucose-Capillary: 226 mg/dL — ABNORMAL HIGH (ref 70–99)
Glucose-Capillary: 262 mg/dL — ABNORMAL HIGH (ref 70–99)
Glucose-Capillary: 276 mg/dL — ABNORMAL HIGH (ref 70–99)

## 2019-07-26 LAB — LACTATE DEHYDROGENASE: LDH: 151 U/L (ref 98–192)

## 2019-07-26 LAB — D-DIMER, QUANTITATIVE: D-Dimer, Quant: <0.27 ug/mL-FEU (ref 0.00–0.50)

## 2019-07-26 LAB — C-REACTIVE PROTEIN: CRP: 1.1 mg/dL — ABNORMAL HIGH (ref <1.0)

## 2019-07-26 MED ORDER — INSULIN ASPART 100 UNIT/ML ~~LOC~~ SOLN
8.0000 [IU] | Freq: Three times a day (TID) | SUBCUTANEOUS | Status: DC
  Administered 2019-07-26 – 2019-07-27 (×2): 8 [IU] via SUBCUTANEOUS

## 2019-07-26 MED ORDER — INSULIN DETEMIR 100 UNIT/ML ~~LOC~~ SOLN
56.0000 [IU] | Freq: Every day | SUBCUTANEOUS | Status: DC
  Administered 2019-07-27: 56 [IU] via SUBCUTANEOUS
  Filled 2019-07-26: qty 0.56

## 2019-07-26 NOTE — Progress Notes (Signed)
Physical Therapy Treatment Patient Details Name: Bryan Becker MRN: [id number] DOB: 1961-05-13 Today's Date: 07/26/2019    History of Present Illness 59 year old male admitted 07/22/19 with shortness of breath and sore throat.  He went to an urgent care center and was diagnosed with COVID-19 and since then has had progressively worsening dyspnea, chest tightness, dry cough, body aches. Dx: Covid 19 Viral pneumonia and gastroenteritis. Patient on steroids and IV Remdesivir. PMH: early-onset dementia, asthma, COPD, chronic bronchitis, diastolic CHF, NASH, hypertension, hyperlipidemia, OSA, CVA, type 2 diabetes    PT Comments    Patient progressing to modI with SPC for ambulation, slow steady gait. On room air during session, oxygen saturation 95% during ambulation, HR 69 bpm. Patient denies having any concerns about his mobility upon discharge home. He is agreeable to use of SPC and home PT. He is aware to take his time with activities.    Follow Up Recommendations  Home health PT     Equipment Recommendations  Cane       Precautions / Restrictions Precautions Precautions: Fall    Mobility  Bed Mobility    General bed mobility comments: OOB in chair upon PT arrival  Transfers Overall transfer level: Modified independent Equipment used: None;Straight cane  General transfer comment: sit<>stand from recliner chair   Ambulation/Gait Ambulation/Gait assistance: Modified independent (Device/Increase time);Supervision Gait Distance (Feet): 180 Feet Assistive device: Straight cane Gait Pattern/deviations: Decreased step length - right;Decreased step length - left;Step-through pattern Gait velocity: decreased   General Gait Details: Slow, steady gait with use of SPC, no LOB. Oxygen saturation 95%, HR 69 bpm during ambulation.   Stairs Stairs: Yes Stairs assistance: Supervision;Modified independent (Device/Increase time) Stair Management: (R rail then L rail) Number of Stairs:  2 General stair comments: Patient negotiated single step in room with use of footrail of bed as railing, cues for sequencing trial 1 with patient return demonstrating on trial 2.     Balance Overall balance assessment: Modified Independent         Standing balance support: Single extremity supported      Cognition Arousal/Alertness: Awake/alert Behavior During Therapy: WFL for tasks assessed/performed Overall Cognitive Status: Within Functional Limits for tasks assessed         General Comments General comments (skin integrity, edema, etc.): On room air, oxygen saturation 95% during ambulation, HR 69 bpm. Patient denies having any concerns about his mobility upon discharge home. He is agreeable to use of SPC and home PT. He is aware to take his time with activities.      Pertinent Vitals/Pain Pain Assessment: (no complaints of pain during session, chronic hip pain)           PT Goals (current goals can now be found in the care plan section) Progress towards PT goals: Progressing toward goals    Frequency    Min 3X/week      PT Plan Current plan remains appropriate       AM-PAC PT "6 Clicks" Mobility   Outcome Measure  Help needed turning from your back to your side while in a flat bed without using bedrails?: None Help needed moving from lying on your back to sitting on the side of a flat bed without using bedrails?: None Help needed moving to and from a bed to a chair (including a wheelchair)?: None Help needed standing up from a chair using your arms (e.g., wheelchair or bedside chair)?: None Help needed to walk in hospital room?: None Help needed  climbing 3-5 steps with a railing? : A Little 6 Click Score: 23    End of Session   Activity Tolerance: Patient tolerated treatment well Patient left: in chair;with call bell/phone within reach;with nursing/sitter in room Nurse Communication: Mobility status PT Visit Diagnosis: Unsteadiness on feet  (R26.81);Other abnormalities of gait and mobility (R26.89)     Time: 9689-5702 PT Time Calculation (min) (ACUTE ONLY): 13 min  Charges:  $Gait Training: 8-22 mins                    Birdie Hopes, DPT, PT Acute Rehab [phone] office   Birdie Hopes 07/26/2019, 2:25 PM

## 2019-07-26 NOTE — Progress Notes (Signed)
PROGRESS NOTE                                                                                                                                                                                                             Patient Demographics:    Bryan Becker, is a 59 y.o. male, DOB - 1961/05/02, NHA:[id number]  Outpatient Primary MD for the patient is Robert Bellow, PA-C   Admit date - 07/22/2019   LOS - 4  Chief Complaint  Patient presents with  . Shortness of Breath       Brief Narrative: Patient is a 59 y.o. male with PMHx of early onset dementia, COPD, chronic diastolic heart failure, NASH, HTN, HLD, CVA, DM-2 who presented with diarrhea, vomiting, shortness of breath-was found to have COVID-19 pneumonia.  Significant Events: 3/1>> admit to [facility]  Microbiology data: 3/1>> blood cultures: Negative    Subjective:   He continues to improve-apart from some improving cough-he really does not have any complaints.   Assessment  & Plan :   Covid 19 Viral pneumonia and gastroenteritis: Stable on room air-coughing episodes have improved.  GI symptoms have resolved.  CRP has almost normalized.  Will complete steroid/remdesivir on 3/6.  Fever: afebrile  O2 requirements:  SpO2: 95 %   COVID-19 Labs: Recent Labs    07/24/19 0427 07/25/19 0244 07/26/19 0758  DDIMER 1.29* 0.44 <0.27  LDH 186 152 151  CRP 5.4* 1.9* 1.1*       Component Value Date/Time   BNP 35.3 07/22/2019 1008   BNP 5.4 03/17/2016 1020    Recent Labs  Lab 07/22/19 1008  PROCALCITON <0.10    Lab Results  Component Value Date   SARSCOV2NAA NEGATIVE 07/02/2019     COVID-19 Medications: Steroids: 3/1>> Remdesivir: 3/1>>  Prone/Incentive Spirometry: encouraged  incentive spirometry use 3-4/hour.  DVT Prophylaxis  :  Lovenox   COPD: Stable-continue bronchodilators  Chronic diastolic heart failure: Euvolemic on exam--stable on  Lasix.  Nonobstructive CAD: No anginal symptoms-recently evaluated by cardiology in February 21 during his most recent hospitalization.  Remains on Plavix.  History of [place]/cirrhosis: Stable for outpatient monitoring  HTN: Controlled-continue bisoprolol  DM-2 (A1c 8.6 on 07/23/2019) uncontrolled hyperglycemia due to steroids: CBGs still elevated but overall better controlled-increase Levemir to 56 units, increase Premeal NovoLog to 8 units-continue resistant SSI.  Follow  and optimize.   Recent Labs    07/25/19 2106 07/26/19 0756 07/26/19 1208  GLUCAP 275* 192* 226*   HLD: Continue fenofibrate  Neuropathy: Continue Neurontin.  GERD: Continue PPI  Anxiety/depression: Stable continue Celexa and as needed Ativan  Dementia: Appears to be early dementia-continue Namenda.  Currently answers all my questions appropriately.  Obesity: Estimated body mass index is 38 kg/m as calculated from the following:   Height as of this encounter: 5' 9"  (1.753 m).   Weight as of this encounter: 116.7 kg.    Consults  :  None  Procedures  :  None ABG: No results found for: PHART, PCO2ART, PO2ART, HCO3, TCO2, ACIDBASEDEF, O2SAT  Vent Settings: N/A  Condition - Stable  Family Communication  : Left voicemail for spouse on 3/5  Code Status :  Full Code  Diet :  Diet Order            Diet heart healthy/carb modified Room service appropriate? Yes; Fluid consistency: Thin  Diet effective now               Disposition Plan  : Home with home health when medically stable-likely on 3/6.  Barriers to discharge: Complete 5 days of IV Remdesivir   Antimicorbials  :    Anti-infectives (From admission, onward)   Start     Dose/Rate Route Frequency Ordered Stop   07/24/19 1000  remdesivir 100 mg in sodium chloride 0.9 % 100 mL IVPB     100 mg 200 mL/hr over 30 Minutes Intravenous Daily 07/22/19 2255 07/28/19 0959   07/23/19 1000  remdesivir 100 mg in sodium chloride 0.9 % 100 mL IVPB   Status:  Discontinued     100 mg 200 mL/hr over 30 Minutes Intravenous Daily 07/22/19 2233 07/22/19 2256   07/22/19 2359  remdesivir 100 mg in sodium chloride 0.9 % 100 mL IVPB     100 mg 200 mL/hr over 30 Minutes Intravenous Every 1 hr x 2 07/22/19 2255 07/23/19 0300   07/22/19 2245  remdesivir 200 mg in sodium chloride 0.9% 250 mL IVPB  Status:  Discontinued     200 mg 580 mL/hr over 30 Minutes Intravenous Once 07/22/19 2233 07/22/19 2256      Inpatient Medications  Scheduled Meds: . vitamin C  500 mg Oral Daily  . bisoprolol  5 mg Oral BID  . cholecalciferol  1,000 Units Oral Daily  . citalopram  40 mg Oral Daily  . clopidogrel  75 mg Oral Daily  . dexamethasone (DECADRON) injection  6 mg Intravenous Daily  . enoxaparin (LOVENOX) injection  60 mg Subcutaneous Daily  . famotidine  20 mg Oral BID  . fenofibrate  160 mg Oral Daily  . finasteride  5 mg Oral Daily  . furosemide  40 mg Oral Daily  . gabapentin  300 mg Oral TID  . insulin aspart  0-20 Units Subcutaneous TID WC  . insulin aspart  8 Units Subcutaneous TID WC  . [START ON 07/27/2019] insulin detemir  56 Units Subcutaneous Daily  . memantine  10 mg Oral BID  . pantoprazole  40 mg Oral Daily  . pregabalin  75 mg Oral BID  . sucralfate  1 g Oral TID WC & HS  . umeclidinium-vilanterol  1 puff Inhalation Daily  . zinc sulfate  220 mg Oral Daily   Continuous Infusions: . remdesivir 100 mg in NS 100 mL 100 mg (07/26/19 1124)   PRN Meds:.acetaminophen, chlorpheniramine-HYDROcodone, guaiFENesin, Ipratropium-Albuterol, loperamide, LORazepam  Time Spent in minutes  25  See all Orders from today for further details   Oren Binet M.D on 07/26/2019 at 2:14 PM  To page go to [email or web address] - use universal password  [facility] -  Office  [phone]    Objective:   Vitals:   07/25/19 0421 07/25/19 1551 07/25/19 2000 07/26/19 0620  BP:  119/61 130/65 117/80  Pulse:  74 62 (!) 57  Resp:   17 18  Temp:  97.6 F (36.4 C) 97.9 F (36.6 C) 99.1 F (37.3 C) 99 F (37.2 C)  TempSrc: Oral Oral Oral Oral  SpO2:  94% 96% 95%  Weight:      Height:        Wt Readings from Last 3 Encounters:  07/22/19 116.7 kg  07/03/19 116.5 kg  02/01/17 130.9 kg     Intake/Output Summary (Last 24 hours) at 07/26/2019 1414 Last data filed at 07/26/2019 1124 Gross per 24 hour  Intake 820 ml  Output 1900 ml  Net -1080 ml     Physical Exam Gen Exam:Alert awake-not in any distress HEENT:atraumatic, normocephalic Chest: B/L clear to auscultation anteriorly CVS:S1S2 regular Abdomen:soft non tender, non distended Extremities:no edema Neurology: Non focal Skin: no rash   Data Review:    CBC Recent Labs  Lab 07/22/19 1008 07/23/19 0025 07/24/19 0427 07/25/19 0244 07/26/19 0758  WBC 6.1 6.5 5.0 5.9 5.8  HGB 13.6 14.0 13.5 12.7* 13.5  HCT 41.6 42.2 41.8 37.2* 40.8  PLT 163 171 192 193 207  MCV 90.2 90.0 90.1 88.4 88.7  MCH 29.5 29.9 29.1 30.2 29.3  MCHC 32.7 33.2 32.3 34.1 33.1  RDW 13.0 13.1 12.9 12.8 12.7  LYMPHSABS 1.4 1.4 1.0 1.5 2.0  MONOABS 0.4 0.3 0.1 0.4 0.5  EOSABS 0.0 0.0 0.0 0.0 0.0  BASOSABS 0.0 0.0 0.0 0.0 0.0    Chemistries  Recent Labs  Lab 07/22/19 1008 07/23/19 0025 07/24/19 0427 07/25/19 0244 07/26/19 0758  NA 138 138 139 140 140  K 2.8* 3.3* 4.2 3.8 3.6  CL 101 101 103 105 104  CO2 26 24 25 24 25   GLUCOSE 106* 94 251* 201* 200*  BUN 17 12 18 18 15   CREATININE 0.86 0.81 0.65 0.67 0.61  CALCIUM 9.0 8.8* 9.2 8.6* 8.5*  MG  --  1.9  --   --   --   AST 17 19 15  14* 16  ALT 14 14 14 14 20   ALKPHOS 29* 28* 25* 26* 30*  BILITOT 0.4 0.9 0.6 0.5 0.5   ------------------------------------------------------------------------------------------------------------------ No results for input(s): CHOL, HDL, LDLCALC, TRIG, CHOLHDL, LDLDIRECT in the last 72 hours.  Lab Results  Component Value Date   HGBA1C 8.6 (H) 07/23/2019    ------------------------------------------------------------------------------------------------------------------ No results for input(s): TSH, T4TOTAL, T3FREE, THYROIDAB in the last 72 hours.  Invalid input(s): FREET3 ------------------------------------------------------------------------------------------------------------------ No results for input(s): VITAMINB12, FOLATE, FERRITIN, TIBC, IRON, RETICCTPCT in the last 72 hours.  Coagulation profile Recent Labs  Lab 07/22/19 1008  INR 1.0    Recent Labs    07/25/19 0244 07/26/19 0758  DDIMER 0.44 <0.27    Cardiac Enzymes No results for input(s): CKMB, TROPONINI, MYOGLOBIN in the last 168 hours.  Invalid input(s): CK ------------------------------------------------------------------------------------------------------------------    Component Value Date/Time   BNP 35.3 07/22/2019 1008   BNP 5.4 03/17/2016 1020    Micro Results Recent Results (from the past 240 hour(s))  Culture, blood (Routine x 2)     Status: None (Preliminary result)  Collection Time: 07/22/19 10:08 AM   Specimen: BLOOD  Result Value Ref Range Status   Specimen Description   Final    BLOOD RIGHT ANTECUBITAL Performed at [facility], [address] [place], [place] [address]    Special Requests   Final    BOTTLES DRAWN AEROBIC AND ANAEROBIC Blood Culture adequate volume Performed at [facility], [address] [place], [place] [address]    Culture   Final    NO GROWTH 4 DAYS Performed at [facility], [place] [address], [place], [place] [address]    Report Status PENDING  Incomplete  Culture, blood (Routine x 2)     Status: None (Preliminary result)   Collection Time: 07/22/19 10:15 AM   Specimen: BLOOD  Result Value Ref Range Status   Specimen Description   Final    BLOOD LEFT ANTECUBITAL Performed at [facility], [address] [place], [place] [address]    Special Requests   Final     BOTTLES DRAWN AEROBIC AND ANAEROBIC Blood Culture adequate volume Performed at [facility], [address] [place], [place] [address]    Culture   Final    NO GROWTH 4 DAYS Performed at [facility], [place] [address], [place], [place] [address]    Report Status PENDING  Incomplete  SARS Coronavirus 2 Ag (30 min TAT) -     Status: Abnormal   Collection Time: 07/22/19 10:25 AM   Specimen: Nasal Swab  Result Value Ref Range Status   SARS Coronavirus 2 Ag POSITIVE (A) NEGATIVE Final    Comment: RESULT CALLED TO, READ BACK BY AND VERIFIED WITH: CALLED TO M.SIMS ON [id number] AT 1130 BY SROY (NOTE) SARS-CoV-2 antigen PRESENT. Positive results indicate the presence of viral antigens, but clinical correlation with patient history and other diagnostic information is necessary to determine patient infection status.  Positive results do not rule out bacterial infection or co-infection  with other viruses. False positive results are rare but can occur, and confirmatory RT-PCR testing may be appropriate in some circumstances. The expected result is Negative. Fact Sheet for Patients: PodPark.tn Fact Sheet for Providers: GiftContent.is  This test is not yet approved or cleared by the [place] FDA and  has been authorized for detection and/or diagnosis of SARS-CoV-2 by FDA under an Emergency Use Authorization (EUA).  This EUA will remain in effect (meaning this test can be used) for the duration of  the COVID-19  declaration under Section 564(b)(1) of the Act, 21 U.S.C. section 360bbb-3(b)(1), unless the authorization is terminated or revoked sooner. Performed at [facility], [address], [place], [place] [address]     Radiology Reports CT CHEST WO CONTRAST  Result Date: 07/23/2019 CLINICAL DATA:  COVID-19 positive. Cough. EXAM: CT CHEST WITHOUT CONTRAST TECHNIQUE: Multidetector CT imaging of the chest  was performed following the standard protocol without IV contrast. COMPARISON:  One-view chest x-ray 07/22/2019. CT heart 07/03/2019 FINDINGS: Cardiovascular: Atherosclerotic calcifications are noted in the LAD and right coronary artery. Heart size is normal. No significant pericardial effusion is present. Minimal vascular calcifications are present at the arch without aneurysm or definite stenosis. Mediastinum/Nodes: No significant mediastinal, hilar, or axillary adenopathy is present. The thoracic inlet is normal. Esophagus is unremarkable. Lungs/Pleura: Patchy bilateral airspace opacities are mostly peripheral and more prominent at the lung bases. No significant endobronchial disease is present. No significant pleural effusions or pneumothorax  are present. Upper Abdomen: Cholecystectomy is noted. Limited imaging of the upper abdomen is otherwise unremarkable. Musculoskeletal: Fused anterior osteophytes are present in the thoracic spine. Vertebral body heights and alignment are maintained. No focal lytic or blastic lesions are present. The ribs are within normal limits bilaterally. IMPRESSION: 1. Patchy bilateral airspace opacities are mostly peripheral and more prominent at the lung bases. This is consistent with multi lobar COVID pneumonia. 2. Atherosclerosis including coronary artery disease. 3. Fused anterior osteophytes in the thoracic spine compatible with diffuse idiopathic skeletal hyperostosis. Electronically Signed   By: San Morelle M.D.   On: 07/23/2019 07:03   CT CORONARY MORPH W/CTA COR W/SCORE W/CA W/CM &/OR WO/CM  Addendum Date: 07/03/2019   ADDENDUM REPORT: 07/03/2019 15:44 CLINICAL DATA:  3M with chest pain EXAM: Cardiac/Coronary CTA TECHNIQUE: The patient was scanned on a [employer]. FINDINGS: A 100 kV prospective scan was triggered in the descending thoracic aorta at 111 HU's. Axial non-contrast 3 mm slices were carried out through the heart. The data set was analyzed  on a dedicated work station and scored using the Anderson. Gantry rotation speed was 250 msecs and collimation was .6 mm. No beta blockade and 0.8 mg of sl NTG was given. The 3D data set was reconstructed in 5% intervals of the 67-82 % of the R-R cycle. Diastolic phases were analyzed on a dedicated work station using MPR, MIP and VRT modes. The patient received 80 cc of contrast. Coronary Arteries:  Normal coronary origin.  Right dominance. RCA is a large dominant artery that gives rise to PDA and PLA. There is calcified plaque in the proximal and distal RCA causing minimal (0-24%) stenosis Left main is a large artery that gives rise to LAD and LCX arteries. There is calcified plaque in the distal left main causing minimal (0-24%) stenosis LAD is a large vessel. There is calcified plaque in the proximal and mid LAD causing minimal (0-24%) stenosis. 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 There is no plaque. Other findings: Left Ventricle: Normal size Left Atrium: Mild dilatation Pulmonary Veins: Normal configuration Right Ventricle: Normal size Right Atrium: Mild dilatation Cardiac valves: No calcifications Thoracic aorta: Normal size Pulmonary Arteries: Normal size Systemic Veins: Normal drainage Pericardium: Normal thickness IMPRESSION: 1. Coronary calcium score of 581. This was 958percentile for age and sex matched control. 2. Normal coronary origin with right dominance. 3. Nonobstructive CAD. Calcified plaque throughout LAD and RCA causing minimal (0-24%) stenosis 4. 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 Minimal non-obstructive CAD (0-24%). Consider non-atherosclerotic causes of chest pain. Consider preventive therapy and risk factor modification. Electronically Signed   By: ChOswaldo MilianD   On: 07/03/2019 15:44   Result Date: 07/03/2019 EXAM: OVER-READ INTERPRETATION  CT CHEST The following report is an  over-read performed by radiologist Dr. [facility]etta Billsf [facility] [place] 07/03/2019. This over-read does not include interpretation of cardiac or coronary anatomy or pathology. The coronary calcium score/coronary CTA interpretation by the cardiologist is attached. COMPARISON:  07/02/2019 FINDINGS: Vascular: Ascending aortic caliber 3.5 cm. Descending thoracic aorta at 2.5 cm. Please see dedicated report regarding cardiac structures. Small pulmonary arterial to venous communication on images 112-140 tracking from lower lobe venous branches back to arterial branches in the pulmonary bed. No significant dilation of the vascular bed. Mediastinum/Nodes: Limited imaging of the mediastinum shows no signs of adenopathy. Limited imaging of  the esophagus is normal. Lungs/Pleura: Basilar atelectasis. No signs of pleural effusion. Upper Abdomen: Incidental imaging of upper abdominal contents is normal. Musculoskeletal: No signs of acute bone finding or destructive bone process. Spinal degenerative changes. IMPRESSION: 1. Mild thoracic aortic dilation. Recommend annual imaging followup by CTA or MRA. This recommendation follows 2010 ACCF/AHA/AATS/ACR/ASA/SCA/SCAI/SIR/STS/SVM Guidelines for the Diagnosis and Management of Patients with Thoracic Aortic Disease. Circulation.2010; 121: E366-Q947. Aortic aneurysm NOS (ICD10-I71.9) 2. 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 Significance uncertain. Consider interventional radiology referral for further evaluation as clinically warranted. 3. Please see dedicated report regarding cardiac structures. Electronically Signed: By: Zetta Bills M.D. On: 07/03/2019 13:52   DG Chest Portable 1 View  Result Date: 07/22/2019 CLINICAL DATA:  Shortness of breath.  COVID positive. EXAM: PORTABLE CHEST 1 VIEW COMPARISON:  07/02/2019 FINDINGS: Heart size is normal. No pleural effusion or edema. Decreased lung volumes with bibasilar  atelectasis. No airspace consolidation. IMPRESSION: 1. Low lung volumes with bibasilar atelectasis Electronically Signed   By: Kerby Moors M.D.   On: 07/22/2019 10:08   DG Chest Port 1 View  Result Date: 07/02/2019 CLINICAL DATA:  Short of breath, weakness, fatigue EXAM: PORTABLE CHEST 1 VIEW COMPARISON:  11/27/2016 FINDINGS: The heart size and mediastinal contours are within normal limits. Both lungs are clear. The visualized skeletal structures are unremarkable. IMPRESSION: No active disease. Electronically Signed   By: Randa Ngo M.D.   On: 07/02/2019 15:00   ECHOCARDIOGRAM COMPLETE  Result Date: 07/03/2019    ECHOCARDIOGRAM REPORT   Patient Name:   BESNIK FEBUS Date of Exam: 07/03/2019 Medical Rec #:  [id number]       Height:       69.0 in Accession #:    [id number]      Weight:       256.8 lb Date of Birth:  01-06-1961       BSA:          2.30 m Patient Age:    71 years        BP:           144/68 mmHg Patient Gender: M               HR:           78 bpm. Exam Location:  Inpatient Procedure: 2D Echo Indications:    Chest Pain R07.9  History:        Patient has prior history of Echocardiogram examinations, most                 recent 04/18/2016. CHF, COPD; Risk Factors:Hypertension,                 Diabetes and Dyslipidemia.  Sonographer:    Mikki Santee RDCS (AE) Referring Phys: [id number] [place]  1. Left ventricular ejection fraction, by estimation, is 65 to 70%. The left ventricle has normal function. The left ventrical has no regional wall motion abnormalities. Left ventricular diastolic parameters are indeterminate. Elevated left ventricular end-diastolic pressure.  2. Right ventricular systolic function is normal. The right ventricular size is mildly enlarged. Tricuspid regurgitation signal is inadequate for assessing PA pressure.  3. The mitral valve is normal in structure and function. no evidence of mitral valve regurgitation. No evidence of mitral stenosis.  4. The  aortic valve is normal in structure and function. Aortic valve regurgitation is not visualized. No aortic stenosis is present.  5. The inferior  vena cava is dilated in size with >50% respiratory variability, suggesting right atrial pressure of 8 mmHg. FINDINGS  Left Ventricle: Left ventricular ejection fraction, by estimation, is 65 to 70%. The left ventricle has normal function. The left ventricle has no regional wall motion abnormalities. The left ventricular internal cavity size was normal in size. There is  no left ventricular hypertrophy. Left ventricular diastolic parameters are indeterminate. Right Ventricle: The right ventricular size is mildly enlarged. No increase in right ventricular wall thickness. Right ventricular systolic function is normal. Tricuspid regurgitation signal is inadequate for assessing PA pressure. Left Atrium: Left atrial size was normal in size. Right Atrium: Right atrial size was normal in size. Pericardium: There is no evidence of pericardial effusion. Mitral Valve: The mitral valve is normal in structure and function. Normal mobility of the mitral valve leaflets. No evidence of mitral valve regurgitation. No evidence of mitral valve stenosis. Tricuspid Valve: The tricuspid valve is normal in structure. Tricuspid valve regurgitation is not demonstrated. No evidence of tricuspid stenosis. Aortic Valve: The aortic valve is normal in structure and function. Aortic valve regurgitation is not visualized. No aortic stenosis is present. Pulmonic Valve: The pulmonic valve was normal in structure. Pulmonic valve regurgitation is trivial. No evidence of pulmonic stenosis. Aorta: The aortic root is normal in size and structure. Venous: The inferior vena cava is dilated in size with greater than 50% respiratory variability, suggesting right atrial pressure of 8 mmHg. The inferior vena cava and the hepatic vein show a normal flow pattern. IAS/Shunts: No atrial level shunt detected by color flow  Doppler.  LEFT VENTRICLE PLAX 2D LVIDd:         5.10 cm  Diastology LVIDs:         3.00 cm  LV e' lateral:   7.18 cm/s LV PW:         1.00 cm  LV E/e' lateral: 12.6 LV IVS:        1.00 cm  LV e' medial:    6.53 cm/s LVOT diam:     2.20 cm  LV E/e' medial:  13.9 LV SV:         98.83 ml LV SV Index:   36.54 LVOT Area:     3.80 cm  RIGHT VENTRICLE TAPSE (M-mode): 2.0 cm LEFT ATRIUM             Index       RIGHT ATRIUM           Index LA diam:        3.70 cm 1.61 cm/m  RA Area:     18.40 cm LA Vol (A2C):   75.8 ml 32.99 ml/m RA Volume:   48.80 ml  21.24 ml/m LA Vol (A4C):   61.4 ml 26.72 ml/m LA Biplane Vol: 68.9 ml 29.99 ml/m  AORTIC VALVE LVOT Vmax:   109.00 cm/s LVOT Vmean:  75.800 cm/s LVOT VTI:    0.260 m  AORTA Ao Root diam: 3.30 cm MITRAL VALVE MV Area (PHT): 3.42 cm             SHUNTS MV Decel Time: 222 msec             Systemic VTI:  0.26 m MV E velocity: 90.80 cm/s 103 cm/s  Systemic Diam: 2.20 cm MV A velocity: 89.50 cm/s 70.3 cm/s MV E/A ratio:  1.01       1.5 Fransico Him MD Electronically signed by Fransico Him MD Signature Date/Time: 07/03/2019/10:40:08 AM  Final    CT Angio Chest/Abd/Pel for Dissection W and/or W/WO  Result Date: 07/02/2019 CLINICAL DATA:  Shortness of breath, weakness, fatigue.  Chest pain EXAM: CT ANGIOGRAPHY CHEST, ABDOMEN AND PELVIS TECHNIQUE: Multidetector CT imaging through the chest, abdomen and pelvis was performed using the standard protocol during bolus administration of intravenous contrast. Multiplanar reconstructed images and MIPs were obtained and reviewed to evaluate the vascular anatomy. CONTRAST:  142m OMNIPAQUE IOHEXOL 350 MG/ML SOLN COMPARISON:  09/26/2018 FINDINGS: CTA CHEST FINDINGS Cardiovascular: Heart is normal size. Aorta normal caliber. No dissection. No filling defects in the pulmonary arteries to suggest pulmonary emboli. Mediastinum/Nodes: No mediastinal, hilar, or axillary adenopathy. Trachea and esophagus are unremarkable. Thyroid unremarkable.  Lungs/Pleura: Lungs are clear. No focal airspace opacities or suspicious nodules. No effusions. Musculoskeletal: No acute bony abnormality. Chest wall soft tissues unremarkable. Review of the MIP images confirms the above findings. CTA ABDOMEN AND PELVIS FINDINGS VASCULAR Aorta: Normal caliber aorta without aneurysm, dissection, vasculitis or significant stenosis. Scattered calcifications in the infrarenal aorta. Celiac: Patent without evidence of aneurysm, dissection, vasculitis or significant stenosis. SMA: Patent without evidence of aneurysm, dissection, vasculitis or significant stenosis. Renals: Both renal arteries are patent without evidence of aneurysm, dissection, vasculitis, fibromuscular dysplasia or significant stenosis. IMA: Patent without evidence of aneurysm, dissection, vasculitis or significant stenosis. Inflow: Patent without evidence of aneurysm, dissection, vasculitis or significant stenosis. Veins: No obvious venous abnormality within the limitations of this arterial phase study. Review of the MIP images confirms the above findings. NON-VASCULAR Hepatobiliary: Prior cholecystectomy.  No focal hepatic abnormality. Pancreas: No focal abnormality or ductal dilatation. Spleen: No focal abnormality.  Normal size. Adrenals/Urinary Tract: No adrenal abnormality. No focal renal abnormality. No stones or hydronephrosis. Urinary bladder is unremarkable. Stomach/Bowel: Normal appendix. Stomach, large and small bowel grossly unremarkable. Lymphatic: No adenopathy Reproductive: Prostate enlargement. Brachy therapy seeds in the region of the prostate. Other: No free fluid or free air. Musculoskeletal: No acute bony abnormality. Review of the MIP images confirms the above findings. IMPRESSION: No evidence of aortic aneurysm or dissection. Atherosclerotic calcifications in the infrarenal aorta. No acute cardiopulmonary disease. No acute findings in the abdomen or pelvis. Electronically Signed   By: KRolm Baptise M.D.   On: 07/02/2019 17:32

## 2019-07-26 NOTE — Progress Notes (Signed)
Inpatient Diabetes Program Recommendations  AACE/ADA: New Consensus Statement on Inpatient Glycemic Control (2015)  Target Ranges:  Prepandial:   less than 140 mg/dL      Peak postprandial:   less than 180 mg/dL (1-2 hours)      Critically ill patients:  140 - 180 mg/dL   Lab Results  Component Value Date   GLUCAP 192 (H) 07/26/2019   HGBA1C 8.6 (H) 07/23/2019    Review of Glycemic Control  Diabetes history: DM 2 Outpatient Diabetes medications: Jardiance 25 mg Daily, Amaryl 4 mg bid, Victoza 1.8 mg Daily (was taking Levemir 60 units, Novolog 20-30 units 4x/day at one time)  Current orders for Inpatient glycemic control:  Levemir 52 units Daily Novolog 0-15 units tid Novolog 4 units tid meal coverage  Decadron 6 mg Daily  Inpatient Diabetes Program Recommendations:    -  Add Novolog bedtime correction scale   -  Increase meal coverage to Novolog 6 units tid if pt is eating at least 50% of meals.  Thank you, Nani Gasser. Eunice Oldaker, RN, MSN, CDE  Diabetes Coordinator Inpatient Glycemic Control Team Team Pager [phone] (8am-5pm) 07/26/2019 11:32 AM

## 2019-07-27 DIAGNOSIS — J9601 Acute respiratory failure with hypoxia: Secondary | ICD-10-CM

## 2019-07-27 LAB — CBC WITH DIFFERENTIAL/PLATELET
Abs Immature Granulocytes: 0.06 10*3/uL (ref 0.00–0.07)
Basophils Absolute: 0 10*3/uL (ref 0.0–0.1)
Basophils Relative: 0 %
Eosinophils Absolute: 0 10*3/uL (ref 0.0–0.5)
Eosinophils Relative: 0 %
HCT: 39.8 % (ref 39.0–52.0)
Hemoglobin: 13.3 g/dL (ref 13.0–17.0)
Immature Granulocytes: 1 %
Lymphocytes Relative: 26 %
Lymphs Abs: 1.8 10*3/uL (ref 0.7–4.0)
MCH: 29.4 pg (ref 26.0–34.0)
MCHC: 33.4 g/dL (ref 30.0–36.0)
MCV: 88.1 fL (ref 80.0–100.0)
Monocytes Absolute: 0.5 10*3/uL (ref 0.1–1.0)
Monocytes Relative: 7 %
Neutro Abs: 4.5 10*3/uL (ref 1.7–7.7)
Neutrophils Relative %: 66 %
Platelets: 250 10*3/uL (ref 150–400)
RBC: 4.52 MIL/uL (ref 4.22–5.81)
RDW: 12.6 % (ref 11.5–15.5)
WBC: 6.9 10*3/uL (ref 4.0–10.5)
nRBC: 0 % (ref 0.0–0.2)

## 2019-07-27 LAB — GLUCOSE, CAPILLARY
Glucose-Capillary: 188 mg/dL — ABNORMAL HIGH (ref 70–99)
Glucose-Capillary: 251 mg/dL — ABNORMAL HIGH (ref 70–99)

## 2019-07-27 LAB — C-REACTIVE PROTEIN: CRP: 0.9 mg/dL (ref <1.0)

## 2019-07-27 LAB — COMPREHENSIVE METABOLIC PANEL
ALT: 20 U/L (ref 0–44)
AST: 15 U/L (ref 15–41)
Albumin: 3.1 g/dL — ABNORMAL LOW (ref 3.5–5.0)
Alkaline Phosphatase: 31 U/L — ABNORMAL LOW (ref 38–126)
Anion gap: 10 (ref 5–15)
BUN: 15 mg/dL (ref 6–20)
CO2: 27 mmol/L (ref 22–32)
Calcium: 8.8 mg/dL — ABNORMAL LOW (ref 8.9–10.3)
Chloride: 102 mmol/L (ref 98–111)
Creatinine, Ser: 0.68 mg/dL (ref 0.61–1.24)
GFR calc Af Amer: >60 mL/min (ref >60)
GFR calc non Af Amer: >60 mL/min (ref >60)
Glucose, Bld: 212 mg/dL — ABNORMAL HIGH (ref 70–99)
Potassium: 3.8 mmol/L (ref 3.5–5.1)
Sodium: 139 mmol/L (ref 135–145)
Total Bilirubin: 0.4 mg/dL (ref 0.3–1.2)
Total Protein: 6.1 g/dL — ABNORMAL LOW (ref 6.5–8.1)

## 2019-07-27 LAB — CULTURE, BLOOD (ROUTINE X 2)
Culture: NO GROWTH
Culture: NO GROWTH
Special Requests: ADEQUATE
Special Requests: ADEQUATE

## 2019-07-27 MED ORDER — GUAIFENESIN 100 MG/5ML PO SOLN: 5.0000 mL | ORAL | 0 refills | Status: AC | PRN

## 2019-07-27 MED ORDER — BENZONATATE 100 MG PO CAPS: 100.0000 mg | ORAL_CAPSULE | Freq: Four times a day (QID) | ORAL | 0 refills | Status: AC | PRN

## 2019-07-27 NOTE — Progress Notes (Signed)
Patient was discharged home by MD order; discharged instructions  review and give to patient with care notes; IV DIC; skin intact; patient will be escorted to the car by nurse tech via wheelchair.

## 2019-07-27 NOTE — Care Management (Signed)
Notified [name] that patient will DC today.

## 2019-07-27 NOTE — Discharge Summary (Signed)
PATIENT DETAILS Name: Bryan Becker Age: 59 y.o. Sex: male Date of Birth: 06-Sep-1960 MRN: [id number]. Admitting Physician: Norval Morton, MD [id number], Eulogio Ditch, PA-C  Admit Date: 07/22/2019 Discharge date: 07/27/2019  Recommendations for Outpatient Follow-up:  1. Follow up with PCP in 1-2 weeks 2. Please obtain CMP/CBC in one week 3. Repeat Chest Xray in 4-6 week   Admitted From:  Home  Disposition: Home with home health [place]: No  Equipment/Devices: None  Discharge Condition: Stable  CODE STATUS: FULL CODE  Diet recommendation:  Diet Order            Diet - low sodium heart healthy        Diet Carb Modified        Diet heart healthy/carb modified Room service appropriate? Yes; Fluid consistency: Thin  Diet effective now               Brief Summary: Patient is a 59 y.o. male with PMHx of early onset dementia, COPD, chronic diastolic heart failure, NASH, HTN, HLD, CVA, DM-2 who presented with diarrhea, vomiting, shortness of breath-was found to have COVID-19 pneumonia.  Significant Events: 3/1>> admit to [facility]  Microbiology data: 3/1>> blood cultures: Negative  Brief Hospital Course: Covid 19 Viral pneumonia and gastroenteritis: Stable on room air-coughing episodes have improved.  GI symptoms have resolved.  CRP has almost normalized.  Will complete steroid/remdesivir on 3/6.  COVID-19 Labs:  Recent Labs    07/25/19 0244 07/26/19 0758 07/27/19 0258  DDIMER 0.44 <0.27  --   LDH 152 151  --   CRP 1.9* 1.1* 0.9    Lab Results  Component Value Date   SARSCOV2NAA NEGATIVE 07/02/2019     COVID-19 Medications: Steroids: 3/1>>3/6 Remdesivir: 3/1>>3/6   COPD: Stable-continue bronchodilators  Chronic diastolic heart failure: Euvolemic on exam--stable on Lasix.  Nonobstructive CAD: No anginal symptoms-recently evaluated by cardiology in February 21 during his most recent hospitalization.  Remains on Plavix.  History  of [place]/cirrhosis: Stable for outpatient monitoring  HTN: Controlled-continue bisoprolol  DM-2 (A1c 8.6 on 07/23/2019) uncontrolled hyperglycemia due to steroids: CBGs  elevated while in the hospital due to steroids-was on Levemir and premeal NovoLog along with SSI.  Since steroids are not indicated on discharge-suspect patient can be just placed back on his usual home regimen.  He will follow with his primary care practitioner for further optimization of his diabetic regimen.    HLD: Continue fenofibrate  Neuropathy: Continue Neurontin.  GERD: Continue PPI  Anxiety/depression: Stable continue Celexa and as needed Ativan  Dementia: Appears to be early dementia-continue Namenda.  Currently answers all my questions appropriately.  Morbid Obesity: Estimated body mass index is 38 kg/m as calculated from the following:   Height as of this encounter: 5' 9"  (1.753 m).   Weight as of this encounter: 116.7 kg.       Procedures/Studies: None  Discharge Diagnoses:  Principal Problem:   COVID-19 virus infection Active Problems:   Essential hypertension   COPD GOLD  III   Acute respiratory failure with hypoxia (HCC)   Hypokalemia   Discharge Instructions:    Person Under Monitoring Name: Bryan Becker  Location: [place] [address]   Infection Prevention Recommendations for Individuals Confirmed to have, or Being Evaluated for, 2019 Novel Coronavirus (COVID-19) Infection Who Receive Care at Home  Individuals who are confirmed to have, or are being evaluated for, COVID-19 should follow the prevention steps below until a healthcare  provider or local or state health department says they can return to normal activities.  Stay home except to get medical care You should restrict activities outside your home, except for getting medical care. Do not go to work, school, or public areas, and do not use public transportation or taxis.  Call ahead before  visiting your doctor Before your medical appointment, call the healthcare provider and tell them that you have, or are being evaluated for, COVID-19 infection. This will help the healthcare provider's office take steps to keep other people from getting infected. Ask your healthcare provider to call the local or state health department.  Monitor your symptoms Seek prompt medical attention if your illness is worsening (e.g., difficulty breathing). Before going to your medical appointment, call the healthcare provider and tell them that you have, or are being evaluated for, COVID-19 infection. Ask your healthcare provider to call the local or state health department.  Wear a facemask You should wear a facemask that covers your nose and mouth when you are in the same room with other people and when you visit a healthcare provider. People who live with or visit you should also wear a facemask while they are in the same room with you.  Separate yourself from other people in your home As much as possible, you should stay in a different room from other people in your home. Also, you should use a separate bathroom, if available.  Avoid sharing household items You should not share dishes, drinking glasses, cups, eating utensils, towels, bedding, or other items with other people in your home. After using these items, you should wash them thoroughly with soap and water.  Cover your coughs and sneezes Cover your mouth and nose with a tissue when you cough or sneeze, or you can cough or sneeze into your sleeve. Throw used tissues in a lined trash can, and immediately wash your hands with soap and water for at least 20 seconds or use an alcohol-based hand rub.  Wash your [employer] your hands often and thoroughly with soap and water for at least 20 seconds. You can use an alcohol-based hand sanitizer if soap and water are not available and if your hands are not visibly dirty. Avoid touching your eyes,  nose, and mouth with unwashed hands.   Prevention Steps for Caregivers and Household Members of Individuals Confirmed to have, or Being Evaluated for, COVID-19 Infection Being Cared for in the Home  If you live with, or provide care at home for, a person confirmed to have, or being evaluated for, COVID-19 infection please follow these guidelines to prevent infection:  Follow healthcare provider's instructions Make sure that you understand and can help the patient follow any healthcare provider instructions for all care.  Provide for the patient's basic needs You should help the patient with basic needs in the home and provide support for getting groceries, prescriptions, and other personal needs.  Monitor the patient's symptoms If they are getting sicker, call his or her medical provider and tell them that the patient has, or is being evaluated for, COVID-19 infection. This will help the healthcare provider's office take steps to keep other people from getting infected. Ask the healthcare provider to call the local or state health department.  Limit the number of people who have contact with the patient  If possible, have only one caregiver for the patient.  Other household members should stay in another home or place of residence. If this is not possible, they  should stay  in another room, or be separated from the patient as much as possible. Use a separate bathroom, if available.  Restrict visitors who do not have an essential need to be in the home.  Keep older adults, very young children, and other sick people away from the patient Keep older adults, very young children, and those who have compromised immune systems or chronic health conditions away from the patient. This includes people with chronic heart, lung, or kidney conditions, diabetes, and cancer.  Ensure good ventilation Make sure that shared spaces in the home have good air flow, such as from an air conditioner or  an opened window, weather permitting.  Wash your hands often  Wash your hands often and thoroughly with soap and water for at least 20 seconds. You can use an alcohol based hand sanitizer if soap and water are not available and if your hands are not visibly dirty.  Avoid touching your eyes, nose, and mouth with unwashed hands.  Use disposable paper towels to dry your hands. If not available, use dedicated cloth towels and replace them when they become wet.  Wear a facemask and gloves  Wear a disposable facemask at all times in the room and gloves when you touch or have contact with the patient's blood, body fluids, and/or secretions or excretions, such as sweat, saliva, sputum, nasal mucus, vomit, urine, or feces.  Ensure the mask fits over your nose and mouth tightly, and do not touch it during use.  Throw out disposable facemasks and gloves after using them. Do not reuse.  Wash your hands immediately after removing your facemask and gloves.  If your personal clothing becomes contaminated, carefully remove clothing and launder. Wash your hands after handling contaminated clothing.  Place all used disposable facemasks, gloves, and other waste in a lined container before disposing them with other household waste.  Remove gloves and wash your hands immediately after handling these items.  Do not share dishes, glasses, or other household items with the patient  Avoid sharing household items. You should not share dishes, drinking glasses, cups, eating utensils, towels, bedding, or other items with a patient who is confirmed to have, or being evaluated for, COVID-19 infection.  After the person uses these items, you should wash them thoroughly with soap and water.  Wash laundry thoroughly  Immediately remove and wash clothes or bedding that have blood, body fluids, and/or secretions or excretions, such as sweat, saliva, sputum, nasal mucus, vomit, urine, or feces, on them.  Wear gloves  when handling laundry from the patient.  Read and follow directions on labels of laundry or clothing items and detergent. In general, wash and dry with the warmest temperatures recommended on the label.  Clean all areas the individual has used often  Clean all touchable surfaces, such as counters, tabletops, doorknobs, bathroom fixtures, toilets, phones, keyboards, tablets, and bedside tables, every day. Also, clean any surfaces that may have blood, body fluids, and/or secretions or excretions on them.  Wear gloves when cleaning surfaces the patient has come in contact with.  Use a diluted bleach solution (e.g., dilute bleach with 1 part bleach and 10 parts water) or a household disinfectant with a label that says EPA-registered for coronaviruses. To make a bleach solution at home, add 1 tablespoon of bleach to 1 quart (4 cups) of water. For a larger supply, add  cup of bleach to 1 gallon (16 cups) of water.  Read labels of cleaning products and follow recommendations provided  on product labels. Labels contain instructions for safe and effective use of the cleaning product including precautions you should take when applying the product, such as wearing gloves or eye protection and making sure you have good ventilation during use of the product.  Remove gloves and wash hands immediately after cleaning.  Monitor yourself for signs and symptoms of illness Caregivers and household members are considered close contacts, should monitor their health, and will be asked to limit movement outside of the home to the extent possible. Follow the monitoring steps for close contacts listed on the symptom monitoring form.   ? If you have additional questions, contact your local health department or call the epidemiologist on call at [phone] (available 24/7). ? This guidance is subject to change. For the most up-to-date guidance from CDC, please refer to their  website: YouBlogs.pl    Activity:  As tolerated    Discharge Instructions    Call MD for:  difficulty breathing, headache or visual disturbances   Complete by: As directed    Call MD for:  extreme fatigue   Complete by: As directed    Call MD for:  persistant dizziness or light-headedness   Complete by: As directed    Call MD for:  persistant nausea and vomiting   Complete by: As directed    Diet - low sodium heart healthy   Complete by: As directed    Diet Carb Modified   Complete by: As directed    Discharge instructions   Complete by: As directed    1.)  3 weeks of isolation from your first positive Covid test.   Follow with Primary MD  Robert Bellow, PA-C in 1-2 weeks  Please get a complete blood count and chemistry panel checked by your Primary MD at your next visit, and again as instructed by your Primary MD.  Get Medicines reviewed and adjusted: Please take all your medications with you for your next visit with your Primary MD  Laboratory/radiological data: Please request your Primary MD to go over all hospital tests and procedure/radiological results at the follow up, please ask your Primary MD to get all Hospital records sent to his/her office.  In some cases, they will be blood work, cultures and biopsy results pending at the time of your discharge. Please request that your primary care M.D. follows up on these results.  Also Note the following: If you experience worsening of your admission symptoms, develop shortness of breath, life threatening emergency, suicidal or homicidal thoughts you must seek medical attention immediately by calling 911 or calling your MD immediately  if symptoms less severe.  You must read complete instructions/literature along with all the possible adverse reactions/side effects for all the Medicines you take and that have been prescribed to you. Take any new Medicines after  you have completely understood and accpet all the possible adverse reactions/side effects.   Do not drive when taking Pain medications or sleeping medications (Benzodaizepines)  Do not take more than prescribed Pain, Sleep and Anxiety Medications. It is not advisable to combine anxiety,sleep and pain medications without talking with your primary care practitioner  Special Instructions: If you have smoked or chewed Tobacco  in the last 2 yrs please stop smoking, stop any regular Alcohol  and or any Recreational drug use.  Wear Seat belts while driving.  Please note: You were cared for by a hospitalist during your hospital stay. Once you are discharged, your primary care physician will handle any further medical  issues. Please note that NO REFILLS for any discharge medications will be authorized once you are discharged, as it is imperative that you return to your primary care physician (or establish a relationship with a primary care physician if you do not have one) for your post hospital discharge needs so that they can reassess your need for medications and monitor your lab values.   Increase activity slowly   Complete by: As directed      Allergies as of 07/27/2019      Reactions   Actos [pioglitazone] Shortness Of Breath   Caused patient to be in CHF   Aricept [donepezil Hcl] Diarrhea      Medication List    STOP taking these medications   cyclobenzaprine 10 MG tablet Commonly known as: FLEXERIL   insulin detemir 100 UNIT/ML injection Commonly known as: LEVEMIR   Levemir FlexTouch 100 UNIT/ML FlexPen Generic drug: insulin detemir   NovoLOG FlexPen 100 UNIT/ML FlexPen Generic drug: insulin aspart   traMADol 50 MG tablet Commonly known as: ULTRAM     TAKE these medications   Accu-Chek Softclix Lancets lancets USE TO CHECK BLOOD-SUGAR UP TO 4 TIMES DAILY (DX CODE E11.65)   albuterol 1.25 MG/3ML nebulizer solution Commonly known as: ACCUNEB Take 1 ampule by nebulization  every 6 (six) hours as needed for wheezing. What changed: Another medication with the same name was changed. Make sure you understand how and when to take each.   Ventolin HFA 108 (90 Base) MCG/ACT inhaler Generic drug: albuterol INHALE 2 PUFFS EVERY 6 HOURS AS NEEDED What changed: reasons to take this   Anoro Ellipta 62.5-25 MCG/INH Aepb Generic drug: umeclidinium-vilanterol INHALE 1 PUFF INTO THE LUNGS DAILY.   benzonatate 100 MG capsule Commonly known as: Tessalon Perles Take 1 capsule (100 mg total) by mouth every 6 (six) hours as needed for cough.   bisoprolol 5 MG tablet Commonly known as: ZEBETA TAKE 1 TABLET TWICE DAILY What changed: when to take this   blood glucose meter kit and supplies Kit Dispense based on patient and insurance preference. Use up to four times daily as directed. Dx Code: E11.65   citalopram 40 MG tablet Commonly known as: CELEXA TAKE 1 TABLET (40 MG TOTAL) DAILY. What changed: See the new instructions.   clopidogrel 75 MG tablet Commonly known as: PLAVIX TAKE 1 TABLET EVERY DAY   docusate sodium 100 MG capsule Commonly known as: COLACE Take 100 mg by mouth daily as needed for mild constipation.   fenofibrate 160 MG tablet Take 160 mg by mouth daily.   finasteride 5 MG tablet Commonly known as: PROSCAR Take 5 mg by mouth daily.   fluticasone 50 MCG/ACT nasal spray Commonly known as: FLONASE USE 2 SPRAYS IN EACH NOSTRIL ONE TIME DAILY AS NEEDED FOR ALLERGIES What changed: See the new instructions.   furosemide 40 MG tablet Commonly known as: LASIX TAKE 1/2 TABLET TWICE DAILY (MUST MAKE APPOINTMENT) What changed: See the new instructions.   gabapentin 800 MG tablet Commonly known as: NEURONTIN Take 800 mg by mouth 3 (three) times daily. What changed: Another medication with the same name was removed. Continue taking this medication, and follow the directions you see here.   glimepiride 4 MG tablet Commonly known as: AMARYL Take 4  mg by mouth in the morning and at bedtime. What changed: Another medication with the same name was removed. Continue taking this medication, and follow the directions you see here.   glucose blood test strip Commonly known as:  Accu-Chek Aviva Plus USE TO CHECK BLOOD-SUGAR UP TO 4 TIMES DAILY (DX CODE E11.65)   guaiFENesin 100 MG/5ML Soln Commonly known as: ROBITUSSIN Take 5 mLs (100 mg total) by mouth every 4 (four) hours as needed for up to 14 days for cough or to loosen phlegm.   irbesartan 300 MG tablet Commonly known as: AVAPRO TAKE 1 TABLET EVERY DAY   Jardiance 25 MG Tabs tablet Generic drug: empagliflozin Take 25 mg by mouth daily.   L-Lysine 1000 MG Tabs Take 1,000 mg by mouth daily as needed (for fever blisters).   liraglutide 18 MG/3ML Sopn Commonly known as: VICTOZA Inject 0.3 mLs (1.8 mg total) into the skin daily.   LORazepam 1 MG tablet Commonly known as: ATIVAN TAKE 1 TABLET EVERY 8 HOURS AS NEEDED FOR ANXIETY What changed: See the new instructions.   Lyrica 75 MG capsule Generic drug: pregabalin TAKE 1 CAPSULE TWICE DAILY What changed: how much to take   magnesium (amino acid chelate) 133 MG tablet Take 1 tablet by mouth 2 (two) times daily.   memantine 10 MG tablet Commonly known as: NAMENDA TAKE 1 TABLET TWICE DAILY   methocarbamol 750 MG tablet Commonly known as: ROBAXIN Take 750 mg by mouth in the morning and at bedtime.   ondansetron 4 MG disintegrating tablet Commonly known as: Zofran ODT Take 1 tablet (4 mg total) by mouth every 8 (eight) hours as needed for nausea or vomiting.   OXYGEN 2lpm with sleep and occ as needed during the day [employer]   pantoprazole 40 MG tablet Commonly known as: PROTONIX TAKE 1 TABLET EVERY DAY   Potassium Gluconate 595 MG Caps Take 2 capsules by mouth daily.   promethazine 25 MG tablet Commonly known as: PHENERGAN Take 1 tablet (25 mg total) by mouth every 6 (six) hours as needed for nausea or  vomiting.   sucralfate 1 g tablet Commonly known as: CARAFATE Take 1 g by mouth 4 (four) times daily -  with meals and at bedtime.      Follow-up Information    Robert Bellow, PA-C. Schedule an appointment as soon as possible for a visit in 1 week(s).   Specialty: Internal Medicine Contact information: [place] [address] [place] [address] [phone]        Croitoru, Dani Gobble, MD. Schedule an appointment as soon as possible for a visit in 1 month(s).   Specialty: Cardiology Contact information: [address] [place] [address] [phone]          Allergies  Allergen Reactions  . Actos [Pioglitazone] Shortness Of Breath    Caused patient to be in CHF  . Aricept [Donepezil Hcl] Diarrhea    Consultations:   None  Other Procedures/Studies: CT CHEST WO CONTRAST  Result Date: 07/23/2019 CLINICAL DATA:  COVID-19 positive. Cough. EXAM: CT CHEST WITHOUT CONTRAST TECHNIQUE: Multidetector CT imaging of the chest was performed following the standard protocol without IV contrast. COMPARISON:  One-view chest x-ray 07/22/2019. CT heart 07/03/2019 FINDINGS: Cardiovascular: Atherosclerotic calcifications are noted in the LAD and right coronary artery. Heart size is normal. No significant pericardial effusion is present. Minimal vascular calcifications are present at the arch without aneurysm or definite stenosis. Mediastinum/Nodes: No significant mediastinal, hilar, or axillary adenopathy is present. The thoracic inlet is normal. Esophagus is unremarkable. Lungs/Pleura: Patchy bilateral airspace opacities are mostly peripheral and more prominent at the lung bases. No significant endobronchial disease is present. No significant pleural effusions or pneumothorax are present. Upper  Abdomen: Cholecystectomy is noted. Limited imaging of the upper abdomen is otherwise unremarkable. Musculoskeletal: Fused anterior osteophytes are present in the thoracic spine.  Vertebral body heights and alignment are maintained. No focal lytic or blastic lesions are present. The ribs are within normal limits bilaterally. IMPRESSION: 1. Patchy bilateral airspace opacities are mostly peripheral and more prominent at the lung bases. This is consistent with multi lobar COVID pneumonia. 2. Atherosclerosis including coronary artery disease. 3. Fused anterior osteophytes in the thoracic spine compatible with diffuse idiopathic skeletal hyperostosis. Electronically Signed   By: San Morelle M.D.   On: 07/23/2019 07:03   CT CORONARY MORPH W/CTA COR W/SCORE W/CA W/CM &/OR WO/CM  Addendum Date: 07/03/2019   ADDENDUM REPORT: 07/03/2019 15:44 CLINICAL DATA:  81M with chest pain EXAM: Cardiac/Coronary CTA TECHNIQUE: The patient was scanned on a [employer]. FINDINGS: A 100 kV prospective scan was triggered in the descending thoracic aorta at 111 HU's. Axial non-contrast 3 mm slices were carried out through the heart. The data set was analyzed on a dedicated work station and scored using the [place]. Gantry rotation speed was 250 msecs and collimation was .6 mm. No beta blockade and 0.8 mg of sl NTG was given. The 3D data set was reconstructed in 5% intervals of the 67-82 % of the R-R cycle. Diastolic phases were analyzed on a dedicated work station using MPR, MIP and VRT modes. The patient received 80 cc of contrast. Coronary Arteries:  Normal coronary origin.  Right dominance. RCA is a large dominant artery that gives rise to PDA and PLA. There is calcified plaque in the proximal and distal RCA causing minimal (0-24%) stenosis Left main is a large artery that gives rise to LAD and LCX arteries. There is calcified plaque in the distal left main causing minimal (0-24%) stenosis LAD is a large vessel. There is calcified plaque in the proximal and mid LAD causing minimal (0-24%) stenosis. 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 There is no plaque. Other findings: Left Ventricle: Normal size Left Atrium: Mild dilatation Pulmonary Veins: Normal configuration Right Ventricle: Normal size Right Atrium: Mild dilatation Cardiac valves: No calcifications Thoracic aorta: Normal size Pulmonary Arteries: Normal size Systemic Veins: Normal drainage Pericardium: Normal thickness IMPRESSION: 1. Coronary calcium score of 581. This was 965percentile for age and sex matched control. 2. Normal coronary origin with right dominance. 3. Nonobstructive CAD. Calcified plaque throughout LAD and RCA causing minimal (0-24%) stenosis 4. 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 Minimal non-obstructive CAD (0-24%). Consider non-atherosclerotic causes of chest pain. Consider preventive therapy and risk factor modification. Electronically Signed   By: ChOswaldo MilianD   On: 07/03/2019 15:44   Result Date: 07/03/2019 EXAM: OVER-READ INTERPRETATION  CT CHEST The following report is an over-read performed by radiologist Dr. GeZetta Billsf [facility] 07/03/2019. This over-read does not include interpretation of cardiac or coronary anatomy or pathology. The coronary calcium score/coronary CTA interpretation by the cardiologist is attached. COMPARISON:  07/02/2019 FINDINGS: Vascular: Ascending aortic caliber 3.5 cm. Descending thoracic aorta at 2.5 cm. Please see dedicated report regarding cardiac structures. Small pulmonary arterial to venous communication on images 112-140 tracking from lower lobe venous branches back to arterial branches in the pulmonary bed. No significant dilation of the vascular bed. Mediastinum/Nodes: Limited imaging of the mediastinum shows no signs of adenopathy. Limited imaging of the esophagus is  normal. Lungs/Pleura: Basilar atelectasis. No signs of pleural effusion. Upper Abdomen: Incidental imaging of upper abdominal contents is normal. Musculoskeletal: No signs of acute bone finding  or destructive bone process. Spinal degenerative changes. IMPRESSION: 1. Mild thoracic aortic dilation. Recommend annual imaging followup by CTA or MRA. This recommendation follows 2010 ACCF/AHA/AATS/ACR/ASA/SCA/SCAI/SIR/STS/SVM Guidelines for the Diagnosis and Management of Patients with Thoracic Aortic Disease. Circulation.2010; 121: X902-I097. Aortic aneurysm NOS (ICD10-I71.9) 2. 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 Significance uncertain. Consider interventional radiology referral for further evaluation as clinically warranted. 3. Please see dedicated report regarding cardiac structures. Electronically Signed: By: Zetta Bills M.D. On: 07/03/2019 13:52   DG Chest Portable 1 View  Result Date: 07/22/2019 CLINICAL DATA:  Shortness of breath.  COVID positive. EXAM: PORTABLE CHEST 1 VIEW COMPARISON:  07/02/2019 FINDINGS: Heart size is normal. No pleural effusion or edema. Decreased lung volumes with bibasilar atelectasis. No airspace consolidation. IMPRESSION: 1. Low lung volumes with bibasilar atelectasis Electronically Signed   By: Kerby Moors M.D.   On: 07/22/2019 10:08   DG Chest Port 1 View  Result Date: 07/02/2019 CLINICAL DATA:  Short of breath, weakness, fatigue EXAM: PORTABLE CHEST 1 VIEW COMPARISON:  11/27/2016 FINDINGS: The heart size and mediastinal contours are within normal limits. Both lungs are clear. The visualized skeletal structures are unremarkable. IMPRESSION: No active disease. Electronically Signed   By: Randa Ngo M.D.   On: 07/02/2019 15:00   ECHOCARDIOGRAM COMPLETE  Result Date: 07/03/2019    ECHOCARDIOGRAM REPORT   Patient Name:   Bryan Becker Date of Exam: 07/03/2019 Medical Rec #:  [id number]       Height:       69.0 in Accession #:    [id number]      Weight:       256.8 lb Date of Birth:  [birth year]       BSA:          2.30 m Patient Age:    65 years        BP:           144/68 mmHg Patient Gender: M                HR:           78 bpm. Exam Location:  Inpatient Procedure: 2D Echo Indications:    Chest Pain R07.9  History:        Patient has prior history of Echocardiogram examinations, most                 recent 04/18/2016. CHF, COPD; Risk Factors:Hypertension,                 Diabetes and Dyslipidemia.  Sonographer:    Mikki Santee RDCS (AE) Referring Phys: [id number] [place]  1. Left ventricular ejection fraction, by estimation, is 65 to 70%. The left ventricle has normal function. The left ventrical has no regional wall motion abnormalities. Left ventricular diastolic parameters are indeterminate. Elevated left ventricular end-diastolic pressure.  2. Right ventricular systolic function is normal. The right ventricular size is mildly enlarged. Tricuspid regurgitation signal is inadequate for assessing PA pressure.  3. The mitral valve is normal in structure and function. no evidence of mitral valve regurgitation. No evidence of mitral stenosis.  4. The aortic valve is normal in structure and function. Aortic valve regurgitation is not visualized. No aortic stenosis is present.  5. The inferior vena cava is  dilated in size with >50% respiratory variability, suggesting right atrial pressure of 8 mmHg. FINDINGS  Left Ventricle: Left ventricular ejection fraction, by estimation, is 65 to 70%. The left ventricle has normal function. The left ventricle has no regional wall motion abnormalities. The left ventricular internal cavity size was normal in size. There is  no left ventricular hypertrophy. Left ventricular diastolic parameters are indeterminate. Right Ventricle: The right ventricular size is mildly enlarged. No increase in right ventricular wall thickness. Right ventricular systolic function is normal. Tricuspid regurgitation signal is inadequate for assessing PA pressure. Left Atrium: Left atrial size was normal in size. Right Atrium: Right atrial size was normal in size. Pericardium: There is no  evidence of pericardial effusion. Mitral Valve: The mitral valve is normal in structure and function. Normal mobility of the mitral valve leaflets. No evidence of mitral valve regurgitation. No evidence of mitral valve stenosis. Tricuspid Valve: The tricuspid valve is normal in structure. Tricuspid valve regurgitation is not demonstrated. No evidence of tricuspid stenosis. Aortic Valve: The aortic valve is normal in structure and function. Aortic valve regurgitation is not visualized. No aortic stenosis is present. Pulmonic Valve: The pulmonic valve was normal in structure. Pulmonic valve regurgitation is trivial. No evidence of pulmonic stenosis. Aorta: The aortic root is normal in size and structure. Venous: The inferior vena cava is dilated in size with greater than 50% respiratory variability, suggesting right atrial pressure of 8 mmHg. The inferior vena cava and the hepatic vein show a normal flow pattern. IAS/Shunts: No atrial level shunt detected by color flow Doppler.  LEFT VENTRICLE PLAX 2D LVIDd:         5.10 cm  Diastology LVIDs:         3.00 cm  LV e' lateral:   7.18 cm/s LV PW:         1.00 cm  LV E/e' lateral: 12.6 LV IVS:        1.00 cm  LV e' medial:    6.53 cm/s LVOT diam:     2.20 cm  LV E/e' medial:  13.9 LV SV:         98.83 ml LV SV Index:   36.54 LVOT Area:     3.80 cm  RIGHT VENTRICLE TAPSE (M-mode): 2.0 cm LEFT ATRIUM             Index       RIGHT ATRIUM           Index LA diam:        3.70 cm 1.61 cm/m  RA Area:     18.40 cm LA Vol (A2C):   75.8 ml 32.99 ml/m RA Volume:   48.80 ml  21.24 ml/m LA Vol (A4C):   61.4 ml 26.72 ml/m LA Biplane Vol: 68.9 ml 29.99 ml/m  AORTIC VALVE LVOT Vmax:   109.00 cm/s LVOT Vmean:  75.800 cm/s LVOT VTI:    0.260 m  AORTA Ao Root diam: 3.30 cm MITRAL VALVE MV Area (PHT): 3.42 cm             SHUNTS MV Decel Time: 222 msec             Systemic VTI:  0.26 m MV E velocity: 90.80 cm/s 103 cm/s  Systemic Diam: 2.20 cm MV A velocity: 89.50 cm/s 70.3 cm/s MV E/A  ratio:  1.01       1.5 Fransico Him MD Electronically signed by Fransico Him MD Signature Date/Time: 07/03/2019/10:40:08 AM    Final  CT Angio Chest/Abd/Pel for Dissection W and/or W/WO  Result Date: 07/02/2019 CLINICAL DATA:  Shortness of breath, weakness, fatigue.  Chest pain EXAM: CT ANGIOGRAPHY CHEST, ABDOMEN AND PELVIS TECHNIQUE: Multidetector CT imaging through the chest, abdomen and pelvis was performed using the standard protocol during bolus administration of intravenous contrast. Multiplanar reconstructed images and MIPs were obtained and reviewed to evaluate the vascular anatomy. CONTRAST:  173m OMNIPAQUE IOHEXOL 350 MG/ML SOLN COMPARISON:  09/26/2018 FINDINGS: CTA CHEST FINDINGS Cardiovascular: Heart is normal size. Aorta normal caliber. No dissection. No filling defects in the pulmonary arteries to suggest pulmonary emboli. Mediastinum/Nodes: No mediastinal, hilar, or axillary adenopathy. Trachea and esophagus are unremarkable. Thyroid unremarkable. Lungs/Pleura: Lungs are clear. No focal airspace opacities or suspicious nodules. No effusions. Musculoskeletal: No acute bony abnormality. Chest wall soft tissues unremarkable. Review of the MIP images confirms the above findings. CTA ABDOMEN AND PELVIS FINDINGS VASCULAR Aorta: Normal caliber aorta without aneurysm, dissection, vasculitis or significant stenosis. Scattered calcifications in the infrarenal aorta. Celiac: Patent without evidence of aneurysm, dissection, vasculitis or significant stenosis. SMA: Patent without evidence of aneurysm, dissection, vasculitis or significant stenosis. Renals: Both renal arteries are patent without evidence of aneurysm, dissection, vasculitis, fibromuscular dysplasia or significant stenosis. IMA: Patent without evidence of aneurysm, dissection, vasculitis or significant stenosis. Inflow: Patent without evidence of aneurysm, dissection, vasculitis or significant stenosis. Veins: No obvious venous abnormality  within the limitations of this arterial phase study. Review of the MIP images confirms the above findings. NON-VASCULAR Hepatobiliary: Prior cholecystectomy.  No focal hepatic abnormality. Pancreas: No focal abnormality or ductal dilatation. Spleen: No focal abnormality.  Normal size. Adrenals/Urinary Tract: No adrenal abnormality. No focal renal abnormality. No stones or hydronephrosis. Urinary bladder is unremarkable. Stomach/Bowel: Normal appendix. Stomach, large and small bowel grossly unremarkable. Lymphatic: No adenopathy Reproductive: Prostate enlargement. Brachy therapy seeds in the region of the prostate. Other: No free fluid or free air. Musculoskeletal: No acute bony abnormality. Review of the MIP images confirms the above findings. IMPRESSION: No evidence of aortic aneurysm or dissection. Atherosclerotic calcifications in the infrarenal aorta. No acute cardiopulmonary disease. No acute findings in the abdomen or pelvis. Electronically Signed   By: KRolm BaptiseM.D.   On: 07/02/2019 17:32     TODAY-DAY OF DISCHARGE:  Subjective:   JSadik Piasciktoday has no headache,no chest abdominal pain,no new weakness tingling or numbness, feels much better wants to go home today.   Objective:   Blood pressure 127/77, pulse 72, temperature 98.1 F (36.7 C), temperature source Oral, resp. rate 19, height 5' 9"  (1.753 m), weight 116.7 kg, SpO2 96 %.  Intake/Output Summary (Last 24 hours) at 07/27/2019 1103 Last data filed at 07/27/2019 0908 Gross per 24 hour  Intake 1060 ml  Output 1820 ml  Net -760 ml   Filed Weights   07/22/19 0954  Weight: 116.7 kg    Exam: Awake Alert, Oriented *3, No new F.N deficits, Normal affect [place].AT,PERRAL Supple Neck,No JVD, No cervical lymphadenopathy appriciated.  Symmetrical Chest wall movement, Good air movement bilaterally, CTAB RRR,No Gallops,Rubs or new Murmurs, No Parasternal Heave +ve B.Sounds, Abd Soft, Non tender, No organomegaly appriciated, No rebound  -guarding or rigidity. No Cyanosis, Clubbing or edema, No new Rash or bruise   PERTINENT RADIOLOGIC STUDIES: CT CHEST WO CONTRAST  Result Date: 07/23/2019 CLINICAL DATA:  COVID-19 positive. Cough. EXAM: CT CHEST WITHOUT CONTRAST TECHNIQUE: Multidetector CT imaging of the chest was performed following the standard protocol without IV contrast. COMPARISON:  One-view chest x-ray 07/22/2019.  CT heart 07/03/2019 FINDINGS: Cardiovascular: Atherosclerotic calcifications are noted in the LAD and right coronary artery. Heart size is normal. No significant pericardial effusion is present. Minimal vascular calcifications are present at the arch without aneurysm or definite stenosis. Mediastinum/Nodes: No significant mediastinal, hilar, or axillary adenopathy is present. The thoracic inlet is normal. Esophagus is unremarkable. Lungs/Pleura: Patchy bilateral airspace opacities are mostly peripheral and more prominent at the lung bases. No significant endobronchial disease is present. No significant pleural effusions or pneumothorax are present. Upper Abdomen: Cholecystectomy is noted. Limited imaging of the upper abdomen is otherwise unremarkable. Musculoskeletal: Fused anterior osteophytes are present in the thoracic spine. Vertebral body heights and alignment are maintained. No focal lytic or blastic lesions are present. The ribs are within normal limits bilaterally. IMPRESSION: 1. Patchy bilateral airspace opacities are mostly peripheral and more prominent at the lung bases. This is consistent with multi lobar COVID pneumonia. 2. Atherosclerosis including coronary artery disease. 3. Fused anterior osteophytes in the thoracic spine compatible with diffuse idiopathic skeletal hyperostosis. Electronically Signed   By: San Morelle M.D.   On: 07/23/2019 07:03   CT CORONARY MORPH W/CTA COR W/SCORE W/CA W/CM &/OR WO/CM  Addendum Date: 07/03/2019   ADDENDUM REPORT: 07/03/2019 15:44 CLINICAL DATA:  25M with chest  pain EXAM: Cardiac/Coronary CTA TECHNIQUE: The patient was scanned on a [employer]. FINDINGS: A 100 kV prospective scan was triggered in the descending thoracic aorta at 111 HU's. Axial non-contrast 3 mm slices were carried out through the heart. The data set was analyzed on a dedicated work station and scored using the [place]. Gantry rotation speed was 250 msecs and collimation was .6 mm. No beta blockade and 0.8 mg of sl NTG was given. The 3D data set was reconstructed in 5% intervals of the 67-82 % of the R-R cycle. Diastolic phases were analyzed on a dedicated work station using MPR, MIP and VRT modes. The patient received 80 cc of contrast. Coronary Arteries:  Normal coronary origin.  Right dominance. RCA is a large dominant artery that gives rise to PDA and PLA. There is calcified plaque in the proximal and distal RCA causing minimal (0-24%) stenosis Left main is a large artery that gives rise to LAD and LCX arteries. There is calcified plaque in the distal left main causing minimal (0-24%) stenosis LAD is a large vessel. There is calcified plaque in the proximal and mid LAD causing minimal (0-24%) stenosis. 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 There is no plaque. Other findings: Left Ventricle: Normal size Left Atrium: Mild dilatation Pulmonary Veins: Normal configuration Right Ventricle: Normal size Right Atrium: Mild dilatation Cardiac valves: No calcifications Thoracic aorta: Normal size Pulmonary Arteries: Normal size Systemic Veins: Normal drainage Pericardium: Normal thickness IMPRESSION: 1. Coronary calcium score of 581. This was 934percentile for age and sex matched control. 2. Normal coronary origin with right dominance. 3. Nonobstructive CAD. Calcified plaque throughout LAD and RCA causing minimal (0-24%) stenosis 4. 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 Minimal non-obstructive CAD (0-24%).  Consider non-atherosclerotic causes of chest pain. Consider preventive therapy and risk factor modification. Electronically Signed   By: ChOswaldo MilianD   On: 07/03/2019 15:44   Result Date: 07/03/2019 EXAM: OVER-READ INTERPRETATION  CT CHEST The following report is an over-read performed by radiologist Dr. GeZetta Billsf GrNorth [facility], [place] 07/03/2019. This over-read does not include interpretation of  cardiac or coronary anatomy or pathology. The coronary calcium score/coronary CTA interpretation by the cardiologist is attached. COMPARISON:  07/02/2019 FINDINGS: Vascular: Ascending aortic caliber 3.5 cm. Descending thoracic aorta at 2.5 cm. Please see dedicated report regarding cardiac structures. Small pulmonary arterial to venous communication on images 112-140 tracking from lower lobe venous branches back to arterial branches in the pulmonary bed. No significant dilation of the vascular bed. Mediastinum/Nodes: Limited imaging of the mediastinum shows no signs of adenopathy. Limited imaging of the esophagus is normal. Lungs/Pleura: Basilar atelectasis. No signs of pleural effusion. Upper Abdomen: Incidental imaging of upper abdominal contents is normal. Musculoskeletal: No signs of acute bone finding or destructive bone process. Spinal degenerative changes. IMPRESSION: 1. Mild thoracic aortic dilation. Recommend annual imaging followup by CTA or MRA. This recommendation follows 2010 ACCF/AHA/AATS/ACR/ASA/SCA/SCAI/SIR/STS/SVM Guidelines for the Diagnosis and Management of Patients with Thoracic Aortic Disease. Circulation.2010; 121: B096-G836. Aortic aneurysm NOS (ICD10-I71.9) 2. 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 Significance uncertain. Consider interventional radiology referral for further evaluation as clinically warranted. 3. Please see dedicated report regarding cardiac structures. Electronically Signed: By: Zetta Bills M.D. On:  07/03/2019 13:52   DG Chest Portable 1 View  Result Date: 07/22/2019 CLINICAL DATA:  Shortness of breath.  COVID positive. EXAM: PORTABLE CHEST 1 VIEW COMPARISON:  07/02/2019 FINDINGS: Heart size is normal. No pleural effusion or edema. Decreased lung volumes with bibasilar atelectasis. No airspace consolidation. IMPRESSION: 1. Low lung volumes with bibasilar atelectasis Electronically Signed   By: Kerby Moors M.D.   On: 07/22/2019 10:08   DG Chest Port 1 View  Result Date: 07/02/2019 CLINICAL DATA:  Short of breath, weakness, fatigue EXAM: PORTABLE CHEST 1 VIEW COMPARISON:  11/27/2016 FINDINGS: The heart size and mediastinal contours are within normal limits. Both lungs are clear. The visualized skeletal structures are unremarkable. IMPRESSION: No active disease. Electronically Signed   By: Randa Ngo M.D.   On: 07/02/2019 15:00   ECHOCARDIOGRAM COMPLETE  Result Date: 07/03/2019    ECHOCARDIOGRAM REPORT   Patient Name:   NIKI COSMAN Date of Exam: 07/03/2019 Medical Rec #:  [id number]       Height:       69.0 in Accession #:    [id number]      Weight:       256.8 lb Date of Birth:  April 28, 1961       BSA:          2.30 m Patient Age:    86 years        BP:           144/68 mmHg Patient Gender: M               HR:           78 bpm. Exam Location:  Inpatient Procedure: 2D Echo Indications:    Chest Pain R07.9  History:        Patient has prior history of Echocardiogram examinations, most                 recent 04/18/2016. CHF, COPD; Risk Factors:Hypertension,                 Diabetes and Dyslipidemia.  Sonographer:    Mikki Santee RDCS (AE) Referring Phys: [id number] [place]  1. Left ventricular ejection fraction, by estimation, is 65 to 70%. The left ventricle has normal function. The left ventrical has no regional wall motion abnormalities.  Left ventricular diastolic parameters are indeterminate. Elevated left ventricular end-diastolic pressure.  2. Right ventricular systolic  function is normal. The right ventricular size is mildly enlarged. Tricuspid regurgitation signal is inadequate for assessing PA pressure.  3. The mitral valve is normal in structure and function. no evidence of mitral valve regurgitation. No evidence of mitral stenosis.  4. The aortic valve is normal in structure and function. Aortic valve regurgitation is not visualized. No aortic stenosis is present.  5. The inferior vena cava is dilated in size with >50% respiratory variability, suggesting right atrial pressure of 8 mmHg. FINDINGS  Left Ventricle: Left ventricular ejection fraction, by estimation, is 65 to 70%. The left ventricle has normal function. The left ventricle has no regional wall motion abnormalities. The left ventricular internal cavity size was normal in size. There is  no left ventricular hypertrophy. Left ventricular diastolic parameters are indeterminate. Right Ventricle: The right ventricular size is mildly enlarged. No increase in right ventricular wall thickness. Right ventricular systolic function is normal. Tricuspid regurgitation signal is inadequate for assessing PA pressure. Left Atrium: Left atrial size was normal in size. Right Atrium: Right atrial size was normal in size. Pericardium: There is no evidence of pericardial effusion. Mitral Valve: The mitral valve is normal in structure and function. Normal mobility of the mitral valve leaflets. No evidence of mitral valve regurgitation. No evidence of mitral valve stenosis. Tricuspid Valve: The tricuspid valve is normal in structure. Tricuspid valve regurgitation is not demonstrated. No evidence of tricuspid stenosis. Aortic Valve: The aortic valve is normal in structure and function. Aortic valve regurgitation is not visualized. No aortic stenosis is present. Pulmonic Valve: The pulmonic valve was normal in structure. Pulmonic valve regurgitation is trivial. No evidence of pulmonic stenosis. Aorta: The aortic root is normal in size and  structure. Venous: The inferior vena cava is dilated in size with greater than 50% respiratory variability, suggesting right atrial pressure of 8 mmHg. The inferior vena cava and the hepatic vein show a normal flow pattern. IAS/Shunts: No atrial level shunt detected by color flow Doppler.  LEFT VENTRICLE PLAX 2D LVIDd:         5.10 cm  Diastology LVIDs:         3.00 cm  LV e' lateral:   7.18 cm/s LV PW:         1.00 cm  LV E/e' lateral: 12.6 LV IVS:        1.00 cm  LV e' medial:    6.53 cm/s LVOT diam:     2.20 cm  LV E/e' medial:  13.9 LV SV:         98.83 ml LV SV Index:   36.54 LVOT Area:     3.80 cm  RIGHT VENTRICLE TAPSE (M-mode): 2.0 cm LEFT ATRIUM             Index       RIGHT ATRIUM           Index LA diam:        3.70 cm 1.61 cm/m  RA Area:     18.40 cm LA Vol (A2C):   75.8 ml 32.99 ml/m RA Volume:   48.80 ml  21.24 ml/m LA Vol (A4C):   61.4 ml 26.72 ml/m LA Biplane Vol: 68.9 ml 29.99 ml/m  AORTIC VALVE LVOT Vmax:   109.00 cm/s LVOT Vmean:  75.800 cm/s LVOT VTI:    0.260 m  AORTA Ao Root diam: 3.30 cm MITRAL VALVE MV Area (  PHT): 3.42 cm             SHUNTS MV Decel Time: 222 msec             Systemic VTI:  0.26 m MV E velocity: 90.80 cm/s 103 cm/s  Systemic Diam: 2.20 cm MV A velocity: 89.50 cm/s 70.3 cm/s MV E/A ratio:  1.01       1.5 Fransico Him MD Electronically signed by Fransico Him MD Signature Date/Time: 07/03/2019/10:40:08 AM    Final    CT Angio Chest/Abd/Pel for Dissection W and/or W/WO  Result Date: 07/02/2019 CLINICAL DATA:  Shortness of breath, weakness, fatigue.  Chest pain EXAM: CT ANGIOGRAPHY CHEST, ABDOMEN AND PELVIS TECHNIQUE: Multidetector CT imaging through the chest, abdomen and pelvis was performed using the standard protocol during bolus administration of intravenous contrast. Multiplanar reconstructed images and MIPs were obtained and reviewed to evaluate the vascular anatomy. CONTRAST:  129m OMNIPAQUE IOHEXOL 350 MG/ML SOLN COMPARISON:  09/26/2018 FINDINGS: CTA CHEST  FINDINGS Cardiovascular: Heart is normal size. Aorta normal caliber. No dissection. No filling defects in the pulmonary arteries to suggest pulmonary emboli. Mediastinum/Nodes: No mediastinal, hilar, or axillary adenopathy. Trachea and esophagus are unremarkable. Thyroid unremarkable. Lungs/Pleura: Lungs are clear. No focal airspace opacities or suspicious nodules. No effusions. Musculoskeletal: No acute bony abnormality. Chest wall soft tissues unremarkable. Review of the MIP images confirms the above findings. CTA ABDOMEN AND PELVIS FINDINGS VASCULAR Aorta: Normal caliber aorta without aneurysm, dissection, vasculitis or significant stenosis. Scattered calcifications in the infrarenal aorta. Celiac: Patent without evidence of aneurysm, dissection, vasculitis or significant stenosis. SMA: Patent without evidence of aneurysm, dissection, vasculitis or significant stenosis. Renals: Both renal arteries are patent without evidence of aneurysm, dissection, vasculitis, fibromuscular dysplasia or significant stenosis. IMA: Patent without evidence of aneurysm, dissection, vasculitis or significant stenosis. Inflow: Patent without evidence of aneurysm, dissection, vasculitis or significant stenosis. Veins: No obvious venous abnormality within the limitations of this arterial phase study. Review of the MIP images confirms the above findings. NON-VASCULAR Hepatobiliary: Prior cholecystectomy.  No focal hepatic abnormality. Pancreas: No focal abnormality or ductal dilatation. Spleen: No focal abnormality.  Normal size. Adrenals/Urinary Tract: No adrenal abnormality. No focal renal abnormality. No stones or hydronephrosis. Urinary bladder is unremarkable. Stomach/Bowel: Normal appendix. Stomach, large and small bowel grossly unremarkable. Lymphatic: No adenopathy Reproductive: Prostate enlargement. Brachy therapy seeds in the region of the prostate. Other: No free fluid or free air. Musculoskeletal: No acute bony abnormality.  Review of the MIP images confirms the above findings. IMPRESSION: No evidence of aortic aneurysm or dissection. Atherosclerotic calcifications in the infrarenal aorta. No acute cardiopulmonary disease. No acute findings in the abdomen or pelvis. Electronically Signed   By: KRolm BaptiseM.D.   On: 07/02/2019 17:32     PERTINENT LAB RESULTS: CBC: Recent Labs    07/26/19 0758 07/27/19 0258  WBC 5.8 6.9  HGB 13.5 13.3  HCT 40.8 39.8  PLT 207 250   CMET CMP     Component Value Date/Time   NA 139 07/27/2019 0258   K 3.8 07/27/2019 0258   CL 102 07/27/2019 0258   CO2 27 07/27/2019 0258   GLUCOSE 212 (H) 07/27/2019 0258   BUN 15 07/27/2019 0258   CREATININE 0.68 07/27/2019 0258   CREATININE 0.77 03/17/2016 1020   CALCIUM 8.8 (L) 07/27/2019 0258   PROT 6.1 (L) 07/27/2019 0258   ALBUMIN 3.1 (L) 07/27/2019 0258   AST 15 07/27/2019 0258   ALT 20 07/27/2019 0258   ALKPHOS  31 (L) 07/27/2019 0258   BILITOT 0.4 07/27/2019 0258   GFRNONAA >60 07/27/2019 0258   GFRAA >60 07/27/2019 0258    GFR Estimated Creatinine Clearance: 123.8 mL/min (by C-G formula based on SCr of 0.68 mg/dL). No results for input(s): LIPASE, AMYLASE in the last 72 hours. No results for input(s): CKTOTAL, CKMB, CKMBINDEX, TROPONINI in the last 72 hours. Invalid input(s): POCBNP Recent Labs    07/25/19 0244 07/26/19 0758  DDIMER 0.44 <0.27   No results for input(s): HGBA1C in the last 72 hours. No results for input(s): CHOL, HDL, LDLCALC, TRIG, CHOLHDL, LDLDIRECT in the last 72 hours. No results for input(s): TSH, T4TOTAL, T3FREE, THYROIDAB in the last 72 hours.  Invalid input(s): FREET3 No results for input(s): VITAMINB12, FOLATE, FERRITIN, TIBC, IRON, RETICCTPCT in the last 72 hours. Coags: No results for input(s): INR in the last 72 hours.  Invalid input(s): PT Microbiology: Recent Results (from the past 240 hour(s))  Culture, blood (Routine x 2)     Status: None (Preliminary result)   Collection  Time: 07/22/19 10:08 AM   Specimen: BLOOD  Result Value Ref Range Status   Specimen Description   Final    BLOOD RIGHT ANTECUBITAL Performed at [facility], [address] [place], [place] [address]    Special Requests   Final    BOTTLES DRAWN AEROBIC AND ANAEROBIC Blood Culture adequate volume Performed at [facility], [address] [place], [place] [address]    Culture   Final    NO GROWTH 4 DAYS Performed at [facility], [name] [address], [place], [place] [address]    Report Status PENDING  Incomplete  Culture, blood (Routine x 2)     Status: None (Preliminary result)   Collection Time: 07/22/19 10:15 AM   Specimen: BLOOD  Result Value Ref Range Status   Specimen Description   Final    BLOOD LEFT ANTECUBITAL Performed at [facility], [address] [place], [place] [address]    Special Requests   Final    BOTTLES DRAWN AEROBIC AND ANAEROBIC Blood Culture adequate volume Performed at [facility], [address] [place], [place] [address]    Culture   Final    NO GROWTH 4 DAYS Performed at [facility], [place] [address] [place], [place] [address]    Report Status PENDING  Incomplete  SARS Coronavirus 2 Ag (30 min TAT) -     Status: Abnormal   Collection Time: 07/22/19 10:25 AM   Specimen: Nasal Swab  Result Value Ref Range Status   SARS Coronavirus 2 Ag POSITIVE (A) NEGATIVE Final    Comment: RESULT CALLED TO, READ BACK BY AND VERIFIED WITH: CALLED TO M.SIMS ON [id number] AT 1130 BY SROY (NOTE) SARS-CoV-2 antigen PRESENT. Positive results indicate the presence of viral antigens, but clinical correlation with patient history and other diagnostic information is necessary to determine patient infection status.  Positive results do not rule out bacterial infection or co-infection  with other viruses. False positive results are rare but can occur, and confirmatory RT-PCR testing may be appropriate in  some circumstances. The expected result is Negative. Fact Sheet for Patients: PodPark.tn Fact Sheet for Providers: GiftContent.is  This test is not yet approved or cleared by the [place] FDA and  has been authorized for detection and/or diagnosis of SARS-CoV-2 by FDA under an Emergency Use Authorization (EUA).  This EUA will remain in effect (  meaning this test can be used) for the duration of  the COVID-19  declaration under Section 564(b)(1) of the Act, 21 U.S.C. section 360bbb-3(b)(1), unless the authorization is terminated or revoked sooner. Performed at [facility], [address] [place], [place] [address]     FURTHER DISCHARGE INSTRUCTIONS:  Get Medicines reviewed and adjusted: Please take all your medications with you for your next visit with your Primary MD  Laboratory/radiological data: Please request your Primary MD to go over all hospital tests and procedure/radiological results at the follow up, please ask your Primary MD to get all Hospital records sent to his/her office.  In some cases, they will be blood work, cultures and biopsy results pending at the time of your discharge. Please request that your primary care M.D. goes through all the records of your hospital data and follows up on these results.  Also Note the following: If you experience worsening of your admission symptoms, develop shortness of breath, life threatening emergency, suicidal or homicidal thoughts you must seek medical attention immediately by calling 911 or calling your MD immediately  if symptoms less severe.  You must read complete instructions/literature along with all the possible adverse reactions/side effects for all the Medicines you take and that have been prescribed to you. Take any new Medicines after you have completely understood and accpet all the possible adverse reactions/side effects.   Do not drive when  taking Pain medications or sleeping medications (Benzodaizepines)  Do not take more than prescribed Pain, Sleep and Anxiety Medications. It is not advisable to combine anxiety,sleep and pain medications without talking with your primary care practitioner  Special Instructions: If you have smoked or chewed Tobacco  in the last 2 yrs please stop smoking, stop any regular Alcohol  and or any Recreational drug use.  Wear Seat belts while driving.  Please note: You were cared for by a hospitalist during your hospital stay. Once you are discharged, your primary care physician will handle any further medical issues. Please note that NO REFILLS for any discharge medications will be authorized once you are discharged, as it is imperative that you return to your primary care physician (or establish a relationship with a primary care physician if you do not have one) for your post hospital discharge needs so that they can reassess your need for medications and monitor your lab values.  Total Time spent coordinating discharge including counseling, education and face to face time equals 35 minutes.  SignedOren Binet 07/27/2019 11:03 AM

## 2019-12-26 ENCOUNTER — Other Ambulatory Visit: Payer: Self-pay | Admitting: Cardiovascular Disease

## 2019-12-27 ENCOUNTER — Telehealth: Payer: Self-pay | Admitting: Cardiology

## 2019-12-27 ENCOUNTER — Telehealth: Payer: Self-pay

## 2019-12-27 NOTE — Telephone Encounter (Signed)
Message from [place] PA sent to Triage:  Furth, Cadence H, PA-C  P Cv Div Nl Triage Good morning, this patient was seen way back in February in the hospital and appears he missed his hspital follow-up/labs. Please call and touch base with the patient and schedule for apt in the next month or two with Dr. Loletha Grayer or APP.   Thanks, Cadence   Will send message to schedulers to contact pt for follow up with Dr.C or APP

## 2019-12-27 NOTE — Telephone Encounter (Signed)
LVM for patient to return call to get follow up scheduled with [place] from recall list

## 2020-04-29 ENCOUNTER — Emergency Department (HOSPITAL_BASED_OUTPATIENT_CLINIC_OR_DEPARTMENT_OTHER): Payer: Medicare HMO

## 2020-04-29 ENCOUNTER — Emergency Department (HOSPITAL_BASED_OUTPATIENT_CLINIC_OR_DEPARTMENT_OTHER)
Admission: EM | Admit: 2020-04-29 | Discharge: 2020-04-29 | Disposition: A | Discharge status: Home / Self Care | Payer: Medicare HMO | Attending: Emergency Medicine | Admitting: Emergency Medicine

## 2020-04-29 ENCOUNTER — Other Ambulatory Visit: Payer: Self-pay

## 2020-04-29 ENCOUNTER — Encounter (HOSPITAL_BASED_OUTPATIENT_CLINIC_OR_DEPARTMENT_OTHER): Payer: Self-pay | Admitting: *Deleted

## 2020-04-29 DIAGNOSIS — J449 Chronic obstructive pulmonary disease, unspecified: Secondary | ICD-10-CM | POA: Insufficient documentation

## 2020-04-29 DIAGNOSIS — F0390 Unspecified dementia without behavioral disturbance: Secondary | ICD-10-CM | POA: Diagnosis not present

## 2020-04-29 DIAGNOSIS — Z7984 Long term (current) use of oral hypoglycemic drugs: Secondary | ICD-10-CM | POA: Diagnosis not present

## 2020-04-29 DIAGNOSIS — R0789 Other chest pain: Secondary | ICD-10-CM | POA: Diagnosis not present

## 2020-04-29 DIAGNOSIS — Z79899 Other long term (current) drug therapy: Secondary | ICD-10-CM | POA: Insufficient documentation

## 2020-04-29 DIAGNOSIS — I2510 Atherosclerotic heart disease of native coronary artery without angina pectoris: Secondary | ICD-10-CM | POA: Insufficient documentation

## 2020-04-29 DIAGNOSIS — I110 Hypertensive heart disease with heart failure: Secondary | ICD-10-CM | POA: Insufficient documentation

## 2020-04-29 DIAGNOSIS — Z7951 Long term (current) use of inhaled steroids: Secondary | ICD-10-CM | POA: Insufficient documentation

## 2020-04-29 DIAGNOSIS — Z87891 Personal history of nicotine dependence: Secondary | ICD-10-CM | POA: Diagnosis not present

## 2020-04-29 DIAGNOSIS — R079 Chest pain, unspecified: Secondary | ICD-10-CM | POA: Diagnosis present

## 2020-04-29 DIAGNOSIS — E119 Type 2 diabetes mellitus without complications: Secondary | ICD-10-CM | POA: Insufficient documentation

## 2020-04-29 DIAGNOSIS — Z794 Long term (current) use of insulin: Secondary | ICD-10-CM | POA: Insufficient documentation

## 2020-04-29 DIAGNOSIS — I509 Heart failure, unspecified: Secondary | ICD-10-CM | POA: Diagnosis not present

## 2020-04-29 LAB — CBC WITH DIFFERENTIAL/PLATELET
Abs Immature Granulocytes: 0.01 10*3/uL (ref 0.00–0.07)
Basophils Absolute: 0.1 10*3/uL (ref 0.0–0.1)
Basophils Relative: 1 %
Eosinophils Absolute: 0.2 10*3/uL (ref 0.0–0.5)
Eosinophils Relative: 6 %
HCT: 43.3 % (ref 39.0–52.0)
Hemoglobin: 14.2 g/dL (ref 13.0–17.0)
Immature Granulocytes: 0 %
Lymphocytes Relative: 44 %
Lymphs Abs: 1.7 10*3/uL (ref 0.7–4.0)
MCH: 29.9 pg (ref 26.0–34.0)
MCHC: 32.8 g/dL (ref 30.0–36.0)
MCV: 91.2 fL (ref 80.0–100.0)
Monocytes Absolute: 0.3 10*3/uL (ref 0.1–1.0)
Monocytes Relative: 8 %
Neutro Abs: 1.6 10*3/uL — ABNORMAL LOW (ref 1.7–7.7)
Neutrophils Relative %: 41 %
Platelets: 164 10*3/uL (ref 150–400)
RBC: 4.75 MIL/uL (ref 4.22–5.81)
RDW: 14.1 % (ref 11.5–15.5)
WBC: 4 10*3/uL (ref 4.0–10.5)
nRBC: 0 % (ref 0.0–0.2)

## 2020-04-29 LAB — COMPREHENSIVE METABOLIC PANEL
ALT: 19 U/L (ref 0–44)
AST: 22 U/L (ref 15–41)
Albumin: 4.5 g/dL (ref 3.5–5.0)
Alkaline Phosphatase: 34 U/L — ABNORMAL LOW (ref 38–126)
Anion gap: 10 (ref 5–15)
BUN: 11 mg/dL (ref 6–20)
CO2: 23 mmol/L (ref 22–32)
Calcium: 8.8 mg/dL — ABNORMAL LOW (ref 8.9–10.3)
Chloride: 107 mmol/L (ref 98–111)
Creatinine, Ser: 0.72 mg/dL (ref 0.61–1.24)
GFR, Estimated: >60 mL/min (ref >60)
Glucose, Bld: 186 mg/dL — ABNORMAL HIGH (ref 70–99)
Potassium: 3.5 mmol/L (ref 3.5–5.1)
Sodium: 140 mmol/L (ref 135–145)
Total Bilirubin: 0.1 mg/dL — ABNORMAL LOW (ref 0.3–1.2)
Total Protein: 7.2 g/dL (ref 6.5–8.1)

## 2020-04-29 LAB — TROPONIN I (HIGH SENSITIVITY): Troponin I (High Sensitivity): 3 ng/L (ref <18)

## 2020-04-29 MED ORDER — IOHEXOL 350 MG/ML SOLN
100.0000 mL | Freq: Once | INTRAVENOUS | Status: AC | PRN
  Administered 2020-04-29: 100 mL via INTRAVENOUS

## 2020-04-29 MED ORDER — KETOROLAC TROMETHAMINE 30 MG/ML IJ SOLN
30.0000 mg | Freq: Once | INTRAMUSCULAR | Status: AC
  Administered 2020-04-29: 30 mg via INTRAVENOUS
  Filled 2020-04-29: qty 1

## 2020-04-29 NOTE — ED Triage Notes (Signed)
C/o right  sided chest pain x 5 days

## 2020-04-29 NOTE — ED Provider Notes (Signed)
[facility] EMERGENCY DEPARTMENT Provider Note  CSN: [id number] Arrival date & time: 04/29/20 1759    History Chief Complaint  Patient presents with  . Chest Pain    HPI  Bryan Becker is a 59 y.o. male with history of CHF but no known CAD reports 5 days of persistent R sided chest pain radiating around to his upper back, worse with certain movements and deep breath, associated with mild SOB. No associated cough or fever. No recent heavy lifting or falls. He is on pain medications including a buprenorphine patch for chronic low back pain and muscle relaxers without relief of this pain. He was recently admitted to [facility] for shigella colitis sepsis, recovered after a few days and completed antibiotics. Discharged about 2 weeks ago. He has chronic LE edema, denies any worsening.    Past Medical History:  Diagnosis Date  . Anxiety   . Arthritis    "joints"  . Asthma   . CHF (congestive heart failure) ([place])   . Chronic back pain    "mostly lower; have 2 bulging discs in my neck" (12/05/2013)  . Chronic bronchitis ([place])    "get it q yr"  . 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 Confusion   . COPD (chronic obstructive pulmonary disease) ([place])   . Dementia ([place])    onset of early  . Dementia ([place])   . Depression   . Fall at home 06/04/14   Pt stated he falls off the bed at nightly  . Family history of adverse reaction to anesthesia    Patients mother had a bad reaction to Anesthesia however pt is unaware of reaction  . Fibromyalgia   . Frequent urination   . Gastric ulcer   . GERD (gastroesophageal reflux disease)   . WUJWJXBJ(478.2)    "weekly" (12/04/2013)  . Hyperlipidemia   . Hypertension   . Kidney stone   . Myocardial infarction [facility])    unsure about MI history, but had normal cath that admission '07 ([facility])  . Nausea & vomiting   . OSA (obstructive sleep apnea) 04/24/2013   "use BiPAP"  . Pancreatitis   . Pneumonia    "several  times"  . RMSF [facility] spotted fever) 11/22/2012  . Seasonal allergies   . Shortness of breath dyspnea    with ambulation  . Stroke [facility]) 03/2013   "still weak on the left side" (12/05/2013)  . Stroke [facility]) 12/04/2013   "came in today w/right sided weakness" (12/04/2013)  . Type II diabetes mellitus ([facility])     Past Surgical History:  Procedure Laterality Date  . CARDIAC CATHETERIZATION     10/28/05: right dominant, normal LV systolic function, normal coronaries. (Dr. Baxter Hire, [facility])  . CHOLECYSTECTOMY    . COLONOSCOPY    . COLONOSCOPY WITH PROPOFOL N/A 07/08/2016   Procedure: COLONOSCOPY WITH PROPOFOL;  Surgeon: Gatha Mayer, MD;  Location: [facility] ENDOSCOPY;  Service: Endoscopy;  Laterality: N/A;  . CYSTOSCOPY W/ STONE MANIPULATION    . LUMBAR LAMINECTOMY/DECOMPRESSION MICRODISCECTOMY Left 06/18/2014   Procedure: LUMBAR LAMINECTOMY/DECOMPRESSION MICRODISCECTOMY LEFT LUMBAR FIVE-SACRAL ONE;  Surgeon: Ashok Pall, MD;  Location: [facility] NEURO ORS;  Service: Neurosurgery;  Laterality: Left;  left  . multiple cysts removal-hip,wrist    . TONSILLECTOMY  ~ 1985  . UPPER GASTROINTESTINAL ENDOSCOPY      Family History  Problem Relation Age of Onset  . Skin cancer Father   . Heart attack Father   .  Stomach cancer Father   . Hypertension Father   . Aneurysm Father   . Lung cancer Paternal Grandfather   . Heart disease Paternal Grandfather   . Stomach cancer Maternal Grandfather   . Bone cancer Paternal Grandmother   . Heart attack Paternal Grandmother   . Diabetes Mother   . Hypertension Mother   . Arthritis Mother   . Other Mother        brain stem vertigo/disorder of pancreas  . Diabetes Maternal Grandmother   . Heart disease Maternal Grandmother   . Aneurysm Maternal Uncle   . Aneurysm Maternal Uncle   . Colon cancer Neg Hx   . Esophageal cancer Neg Hx   . Rectal cancer Neg Hx     Social History   Tobacco Use  . Smoking status: Former Smoker    Packs/day: 2.00    Years:  21.00    Pack years: 42.00    Types: Cigarettes    Quit date: 05/23/2002    Years since quitting: 17.9  . Smokeless tobacco: Never Used  Substance Use Topics  . Alcohol use: No  . Drug use: No     Home Medications Prior to Admission medications   Medication Sig Start Date End Date Taking? Authorizing Provider  ACCU-CHEK SOFTCLIX LANCETS lancets USE TO CHECK BLOOD-SUGAR UP TO 4 TIMES DAILY (DX CODE E11.65) 10/27/16   Golden Circle, FNP  albuterol (ACCUNEB) 1.25 MG/3ML nebulizer solution Take 1 ampule by nebulization every 6 (six) hours as needed for wheezing.    [provider]  amitriptyline (ELAVIL) 50 MG tablet Take 50 mg by mouth at bedtime. 04/03/20   [provider]  amLODipine (NORVASC) 5 MG tablet  03/09/20   [provider]  ANORO ELLIPTA 62.5-25 MCG/INH AEPB INHALE 1 PUFF INTO THE LUNGS DAILY. 02/09/17   Tanda Rockers, MD  benzonatate (TESSALON PERLES) 100 MG capsule Take 1 capsule (100 mg total) by mouth every 6 (six) hours as needed for cough. 07/27/19 07/26/20  Ghimire, Henreitta Leber, MD  bisoprolol (ZEBETA) 5 MG tablet TAKE 1 TABLET TWICE DAILY Patient taking differently: Take 5 mg by mouth in the morning and at bedtime.  02/09/17   Tanda Rockers, MD  blood glucose meter kit and supplies KIT Dispense based on patient and insurance preference. Use up to four times daily as directed. Dx Code: E11.65 09/19/16   Golden Circle, FNP  buprenorphine (BUTRANS) 10 MCG/HR PTWK APPLY 1 PATCH TO SKIN EVERY 7 DAYS 04/13/20   [provider]  citalopram (CELEXA) 40 MG tablet TAKE 1 TABLET (40 MG TOTAL) DAILY. Patient taking differently: Take 40 mg by mouth daily.  01/31/17   Golden Circle, FNP  clopidogrel (PLAVIX) 75 MG tablet TAKE 1 TABLET EVERY DAY Patient taking differently: Take 75 mg by mouth daily.  02/09/17   Golden Circle, FNP  docusate sodium (COLACE) 100 MG capsule Take 100 mg by mouth daily as needed for mild constipation.    [provider]  DULoxetine (CYMBALTA) 60 MG capsule Take 60 mg by mouth daily. 03/09/20   [provider]  empagliflozin (JARDIANCE) 25 MG TABS tablet Take 25 mg by mouth daily.    [provider]  fenofibrate 160 MG tablet Take 160 mg by mouth daily. 06/22/16   [provider]  FEROSUL 325 (65 Fe) MG tablet Take 325 mg by mouth 2 (two) times daily. 02/20/20   [provider]  finasteride (PROSCAR) 5 MG tablet  Take 5 mg by mouth daily.    [provider]  fluticasone (FLONASE) 50 MCG/ACT nasal spray USE 2 SPRAYS IN EACH NOSTRIL ONE TIME DAILY AS NEEDED FOR ALLERGIES Patient taking differently: Place 1 spray into both nostrils daily.  12/09/16   Tanda Rockers, MD  furosemide (LASIX) 40 MG tablet TAKE 1/2 TABLET TWICE DAILY  (MUST  MAKE  APPOINTMENT) 12/26/19   Croitoru, Dani Gobble, MD  gabapentin (NEURONTIN) 800 MG tablet Take 800 mg by mouth 3 (three) times daily.    [provider]  glimepiride (AMARYL) 4 MG tablet Take 4 mg by mouth in the morning and at bedtime.    [provider]  glucose blood (ACCU-CHEK AVIVA PLUS) test strip USE TO CHECK BLOOD-SUGAR UP TO 4 TIMES DAILY (DX CODE E11.65) 02/15/17   Golden Circle, FNP  irbesartan (AVAPRO) 300 MG tablet TAKE 1 TABLET EVERY DAY Patient taking differently: Take 300 mg by mouth daily.  12/09/16   Tanda Rockers, MD  L-Lysine 1000 MG TABS Take 1,000 mg by mouth daily as needed (for fever blisters).    [provider]  liraglutide 18 MG/3ML SOPN Inject 0.3 mLs (1.8 mg total) into the skin daily. 08/15/16   Golden Circle, FNP  lisinopril (ZESTRIL) 40 MG tablet Take 40 mg by mouth 2 (two) times daily. 03/09/20   [provider]  LORazepam (ATIVAN) 1 MG tablet TAKE 1 TABLET EVERY 8 HOURS AS NEEDED FOR ANXIETY Patient taking differently: Take 1 mg by mouth every 8 (eight) hours as needed for anxiety.  02/01/17   Golden Circle, FNP  LYRICA 75 MG capsule TAKE 1 CAPSULE TWICE  DAILY Patient taking differently: Take 75 mg by mouth 2 (two) times daily.  02/21/17   Golden Circle, FNP  meloxicam (MOBIC) 7.5 MG tablet Take 7.5 mg by mouth daily. 04/22/20   [provider]  memantine (NAMENDA) 10 MG tablet TAKE 1 TABLET TWICE DAILY Patient taking differently: Take 10 mg by mouth 2 (two) times daily.  01/31/17   Golden Circle, FNP  methocarbamol (ROBAXIN) 750 MG tablet Take 750 mg by mouth in the morning and at bedtime.    [provider]  ondansetron (ZOFRAN ODT) 4 MG disintegrating tablet Take 1 tablet (4 mg total) by mouth every 8 (eight) hours as needed for nausea or vomiting. 11/22/16   Golden Circle, FNP  oxybutynin (DITROPAN) 5 MG tablet Take 5 mg by mouth 2 (two) times daily. 03/05/20   [provider]  OXYGEN 2lpm with sleep and occ as needed during the day [place]    [provider]  pantoprazole (PROTONIX) 40 MG tablet TAKE 1 TABLET EVERY DAY Patient taking differently: Take 40 mg by mouth daily.  01/31/17   Golden Circle, FNP  Potassium Gluconate 595 MG CAPS Take 2 capsules by mouth daily.    [provider]  promethazine (PHENERGAN) 25 MG tablet Take 1 tablet (25 mg total) by mouth every 6 (six) hours as needed for nausea or vomiting. 02/11/14   Ward, Delice Bison, DO  Specialty Vitamins Products (MAGNESIUM, AMINO ACID CHELATE,) 133 MG tablet Take 1 tablet by mouth 2 (two) times daily.    [provider]  sucralfate (CARAFATE) 1 g tablet Take 1 g by mouth 4 (four) times daily -  with meals and at bedtime.    [provider]  VENTOLIN HFA 108 (90 Base) MCG/ACT inhaler INHALE 2 PUFFS EVERY 6 HOURS AS NEEDED  Patient taking differently: Inhale 2 puffs into the lungs every 6 (six) hours as needed for shortness of breath.  12/16/16   Parrett, Fonnie Mu, NP     Allergies    Actos [pioglitazone] and Aricept [donepezil hcl]   Review of Systems   Review of Systems A comprehensive review of systems was  completed and negative except as noted in HPI.    Physical Exam BP (!) 150/78 (BP Location: Right Arm)   Pulse 69   Temp 98 F (36.7 C) (Oral)   Resp 18   Ht _0  (1.753 m)   Wt 111.1 kg   SpO2 96%   BMI 36.18 kg/m   Physical Exam Vitals and nursing note reviewed.  Constitutional:      Appearance: Normal appearance.  HENT:     Head: Normocephalic and atraumatic.     Nose: Nose normal.     Mouth/Throat:     Mouth: Mucous membranes are moist.  Eyes:     Extraocular Movements: Extraocular movements intact.     Conjunctiva/sclera: Conjunctivae normal.  Cardiovascular:     Rate and Rhythm: Normal rate.  Pulmonary:     Effort: Pulmonary effort is normal.     Breath sounds: Normal breath sounds.  Chest:     Chest wall: Tenderness (R parasternal and around to R thoracic paraspinal soft tissue tenderness) present.  Abdominal:     General: Abdomen is flat.     Palpations: Abdomen is soft.     Tenderness: There is no abdominal tenderness.  Musculoskeletal:        General: No swelling. Normal range of motion.     Cervical back: Neck supple.  Skin:    General: Skin is warm and dry.  Neurological:     General: No focal deficit present.     Mental Status: He is alert.  Psychiatric:        Mood and Affect: Mood normal.      ED Results / Procedures / Treatments   Labs (all labs ordered are listed, but only abnormal results are displayed) Labs Reviewed  CBC WITH DIFFERENTIAL/PLATELET - Abnormal; Notable for the following components:      Result Value   Neutro Abs 1.6 (*)    All other components within normal limits  COMPREHENSIVE METABOLIC PANEL - Abnormal; Notable for the following components:   Glucose, Bld 186 (*)    Calcium 8.8 (*)    Alkaline Phosphatase 34 (*)    Total Bilirubin 0.1 (*)    All other components within normal limits  TROPONIN I (HIGH SENSITIVITY)    EKG EKG Interpretation  Date/Time:  Wednesday April 29 2020 18:04:08 EST Ventricular  Rate:  87 PR Interval:  130 QRS Duration: 88 QT Interval:  364 QTC Calculation: 438 R Axis:   79 Text Interpretation: Normal sinus rhythm ST & T wave abnormality, consider inferior ischemia Abnormal ECG Since last tracing Non-specific ST-t changes Confirmed by Calvert Cantor [phone]) on 04/29/2020 6:12:08 PM    Radiology DG Chest 2 View  Result Date: 04/29/2020 CLINICAL DATA:  Chest pain EXAM: CHEST - 2 VIEW COMPARISON:  04/05/2020 FINDINGS: The heart size and mediastinal contours are within normal limits. Both lungs are clear. Degenerative changes of the spine. IMPRESSION: No active cardiopulmonary disease. Electronically Signed   By: Donavan Foil M.D.   On: 04/29/2020 19:41   CT Angio Chest PE W/Cm &/Or Wo Cm  Result Date: 04/29/2020 CLINICAL DATA:  Right-sided chest pain x5 days. EXAM: CT  ANGIOGRAPHY CHEST WITH CONTRAST TECHNIQUE: Multidetector CT imaging of the chest was performed using the standard protocol during bolus administration of intravenous contrast. Multiplanar CT image reconstructions and MIPs were obtained to evaluate the vascular anatomy. CONTRAST:  131m OMNIPAQUE IOHEXOL 350 MG/ML SOLN COMPARISON:  July 23, 2019 FINDINGS: Cardiovascular: There is mild calcification of the aortic arch. Satisfactory opacification of the pulmonary arteries to the segmental level. No evidence of pulmonary embolism. Normal heart size with moderate to marked severity coronary artery calcification. No pericardial effusion. Mediastinum/Nodes: No enlarged mediastinal, hilar, or axillary lymph nodes. Thyroid gland, trachea, and esophagus demonstrate no significant findings. Lungs/Pleura: Lungs are clear. No pleural effusion or pneumothorax. Upper Abdomen: Multiple surgical clips are seen within the gallbladder fossa. Musculoskeletal: Multilevel degenerative changes seen throughout the thoracic spine. Review of the MIP images confirms the above findings. IMPRESSION: 1. No CT evidence of pulmonary embolism  or active cardiopulmonary disease. 2. Evidence of prior cholecystectomy. 3. Aortic atherosclerosis. Aortic Atherosclerosis (ICD10-I70.0). Electronically Signed   By: TVirgina NorfolkM.D.   On: 04/29/2020 21:16    Procedures Procedures  Medications Ordered in the ED Medications  ketorolac (TORADOL) 30 MG/ML injection 30 mg (has no administration in time range)  iohexol (OMNIPAQUE) 350 MG/ML injection 100 mL (100 mLs Intravenous Contrast Given 04/29/20 2054)     MDM Rules/Calculators/A&P MDM Patient with atypical R sided chest pain. EKG with nonspecific ST/T changes but first Trop is neg. Given pleuritic nature of pain and recent admission will send for CT to rule out PE as well as eval for occult PNA/PTX.  ED Course  I have reviewed the triage vital signs and the nursing notes.  Pertinent labs & imaging results that were available during my care of the patient were reviewed by me and considered in my medical decision making (see chart for details).  Clinical Course as of Apr 30 2143  Wed Apr 29, 2020  2142 CTA neg for acute process. Will give Toradol for MSK pain, he has PCP follow up already scheduled for 2 days. RTED for any other concerns.    [CS]    Clinical Course User Index [CS] STruddie Hidden MD    Final Clinical Impression(s) / ED Diagnoses Final diagnoses:  Atypical chest pain    Rx / DC Orders ED Discharge Orders    None       STruddie Hidden MD 04/29/20 2144

## 2020-04-29 NOTE — ED Notes (Signed)
Duplicate order for EKG observed, MD and Triage RN confirm no repeat has been indicated at this time.

## 2020-04-29 NOTE — ED Notes (Signed)
Patient transported to CT 

## 2020-05-23 DIAGNOSIS — A039 Shigellosis, unspecified: Secondary | ICD-10-CM

## 2020-05-23 HISTORY — DX: Shigellosis, unspecified: A03.9

## 2021-09-22 ENCOUNTER — Emergency Department (HOSPITAL_BASED_OUTPATIENT_CLINIC_OR_DEPARTMENT_OTHER): Payer: Medicare HMO

## 2021-09-22 ENCOUNTER — Encounter (HOSPITAL_BASED_OUTPATIENT_CLINIC_OR_DEPARTMENT_OTHER): Payer: Self-pay

## 2021-09-22 ENCOUNTER — Emergency Department (HOSPITAL_BASED_OUTPATIENT_CLINIC_OR_DEPARTMENT_OTHER)
Admission: EM | Admit: 2021-09-22 | Discharge: 2021-09-22 | Disposition: A | Discharge status: Home / Self Care | Payer: Medicare HMO | Attending: Emergency Medicine | Admitting: Emergency Medicine

## 2021-09-22 ENCOUNTER — Other Ambulatory Visit: Payer: Self-pay

## 2021-09-22 DIAGNOSIS — Z7902 Long term (current) use of antithrombotics/antiplatelets: Secondary | ICD-10-CM | POA: Diagnosis not present

## 2021-09-22 DIAGNOSIS — R109 Unspecified abdominal pain: Secondary | ICD-10-CM

## 2021-09-22 DIAGNOSIS — Z79899 Other long term (current) drug therapy: Secondary | ICD-10-CM | POA: Diagnosis not present

## 2021-09-22 DIAGNOSIS — M546 Pain in thoracic spine: Secondary | ICD-10-CM | POA: Insufficient documentation

## 2021-09-22 DIAGNOSIS — K529 Noninfective gastroenteritis and colitis, unspecified: Secondary | ICD-10-CM | POA: Insufficient documentation

## 2021-09-22 DIAGNOSIS — J449 Chronic obstructive pulmonary disease, unspecified: Secondary | ICD-10-CM | POA: Diagnosis not present

## 2021-09-22 DIAGNOSIS — I110 Hypertensive heart disease with heart failure: Secondary | ICD-10-CM | POA: Insufficient documentation

## 2021-09-22 DIAGNOSIS — Z87891 Personal history of nicotine dependence: Secondary | ICD-10-CM | POA: Diagnosis not present

## 2021-09-22 DIAGNOSIS — J45909 Unspecified asthma, uncomplicated: Secondary | ICD-10-CM | POA: Insufficient documentation

## 2021-09-22 DIAGNOSIS — I509 Heart failure, unspecified: Secondary | ICD-10-CM | POA: Diagnosis not present

## 2021-09-22 DIAGNOSIS — Z7984 Long term (current) use of oral hypoglycemic drugs: Secondary | ICD-10-CM | POA: Diagnosis not present

## 2021-09-22 DIAGNOSIS — R1032 Left lower quadrant pain: Secondary | ICD-10-CM | POA: Diagnosis present

## 2021-09-22 DIAGNOSIS — M545 Low back pain, unspecified: Secondary | ICD-10-CM | POA: Diagnosis not present

## 2021-09-22 DIAGNOSIS — F0390 Unspecified dementia without behavioral disturbance: Secondary | ICD-10-CM | POA: Diagnosis not present

## 2021-09-22 DIAGNOSIS — K5900 Constipation, unspecified: Secondary | ICD-10-CM

## 2021-09-22 LAB — URINALYSIS, ROUTINE W REFLEX MICROSCOPIC
Bilirubin Urine: NEGATIVE
Glucose, UA: >=500 mg/dL — AB
Hgb urine dipstick: NEGATIVE
Ketones, ur: NEGATIVE mg/dL
Leukocytes,Ua: NEGATIVE
Nitrite: NEGATIVE
Protein, ur: NEGATIVE mg/dL
Specific Gravity, Urine: 1.015 (ref 1.005–1.030)
pH: 6.5 (ref 5.0–8.0)

## 2021-09-22 LAB — TROPONIN I (HIGH SENSITIVITY)
Troponin I (High Sensitivity): 4 ng/L (ref <18)
Troponin I (High Sensitivity): 4 ng/L (ref <18)

## 2021-09-22 LAB — COMPREHENSIVE METABOLIC PANEL
ALT: 20 U/L (ref 0–44)
AST: 29 U/L (ref 15–41)
Albumin: 4.1 g/dL (ref 3.5–5.0)
Alkaline Phosphatase: 33 U/L — ABNORMAL LOW (ref 38–126)
Anion gap: 10 (ref 5–15)
BUN: 13 mg/dL (ref 8–23)
CO2: 28 mmol/L (ref 22–32)
Calcium: 9.5 mg/dL (ref 8.9–10.3)
Chloride: 97 mmol/L — ABNORMAL LOW (ref 98–111)
Creatinine, Ser: 0.86 mg/dL (ref 0.61–1.24)
GFR, Estimated: >60 mL/min (ref >60)
Glucose, Bld: 371 mg/dL — ABNORMAL HIGH (ref 70–99)
Potassium: 4.9 mmol/L (ref 3.5–5.1)
Sodium: 135 mmol/L (ref 135–145)
Total Bilirubin: 1.3 mg/dL — ABNORMAL HIGH (ref 0.3–1.2)
Total Protein: 7 g/dL (ref 6.5–8.1)

## 2021-09-22 LAB — CBC WITH DIFFERENTIAL/PLATELET
Abs Immature Granulocytes: 0.01 10*3/uL (ref 0.00–0.07)
Basophils Absolute: 0 10*3/uL (ref 0.0–0.1)
Basophils Relative: 1 %
Eosinophils Absolute: 0.1 10*3/uL (ref 0.0–0.5)
Eosinophils Relative: 2 %
HCT: 41.6 % (ref 39.0–52.0)
Hemoglobin: 14 g/dL (ref 13.0–17.0)
Immature Granulocytes: 0 %
Lymphocytes Relative: 34 %
Lymphs Abs: 1.9 10*3/uL (ref 0.7–4.0)
MCH: 31.2 pg (ref 26.0–34.0)
MCHC: 33.7 g/dL (ref 30.0–36.0)
MCV: 92.7 fL (ref 80.0–100.0)
Monocytes Absolute: 0.4 10*3/uL (ref 0.1–1.0)
Monocytes Relative: 7 %
Neutro Abs: 3.1 10*3/uL (ref 1.7–7.7)
Neutrophils Relative %: 56 %
Platelets: 189 10*3/uL (ref 150–400)
RBC: 4.49 MIL/uL (ref 4.22–5.81)
RDW: 13.1 % (ref 11.5–15.5)
WBC: 5.6 10*3/uL (ref 4.0–10.5)
nRBC: 0 % (ref 0.0–0.2)

## 2021-09-22 LAB — LIPASE, BLOOD: Lipase: 41 U/L (ref 11–51)

## 2021-09-22 LAB — URINALYSIS, MICROSCOPIC (REFLEX)

## 2021-09-22 LAB — BRAIN NATRIURETIC PEPTIDE: B Natriuretic Peptide: 27.9 pg/mL (ref 0.0–100.0)

## 2021-09-22 MED ORDER — FAMOTIDINE IN NACL 20-0.9 MG/50ML-% IV SOLN
20.0000 mg | Freq: Once | INTRAVENOUS | Status: AC
  Administered 2021-09-22: 20 mg via INTRAVENOUS
  Filled 2021-09-22: qty 50

## 2021-09-22 MED ORDER — LIDOCAINE VISCOUS HCL 2 % MT SOLN
15.0000 mL | Freq: Once | OROMUCOSAL | Status: AC
  Administered 2021-09-22: 15 mL via ORAL
  Filled 2021-09-22: qty 15

## 2021-09-22 MED ORDER — ALUM & MAG HYDROXIDE-SIMETH 200-200-20 MG/5ML PO SUSP
30.0000 mL | Freq: Once | ORAL | Status: AC
  Administered 2021-09-22: 30 mL via ORAL
  Filled 2021-09-22: qty 30

## 2021-09-22 MED ORDER — POLYETHYLENE GLYCOL 3350 17 GM/SCOOP PO POWD
17.0000 g | Freq: Every day | ORAL | 0 refills | Status: AC
Start: 2021-09-22 — End: 2021-09-29

## 2021-09-22 MED ORDER — DICYCLOMINE HCL 20 MG PO TABS: 10.0000 mg | ORAL_TABLET | Freq: Two times a day (BID) | ORAL | 0 refills | Status: DC

## 2021-09-22 MED ORDER — SORBITOL 70 % SOLN: 960.0000 mL | TOPICAL_OIL | Freq: Once | ORAL | Status: DC

## 2021-09-22 MED ORDER — IOHEXOL 300 MG/ML  SOLN
100.0000 mL | Freq: Once | INTRAMUSCULAR | Status: AC | PRN
  Administered 2021-09-22: 100 mL via INTRAVENOUS

## 2021-09-22 MED ORDER — FLEET ENEMA 7-19 GM/118ML RE ENEM
1.0000 | ENEMA | Freq: Once | RECTAL | Status: AC
  Administered 2021-09-22: 1 via RECTAL
  Filled 2021-09-22: qty 1

## 2021-09-22 NOTE — ED Provider Notes (Signed)
?[facility] ?Provider Note ? ? ?CSN: [id number] ?Arrival date & time: 09/22/21  0716 ? ?  ? ?History ? ?Chief Complaint  ?Patient presents with  ? Abdominal Pain  ? ? ?Bryan Becker is a 61 y.o. male. ? ? Patient as above with significant medical history as below, including CHF on Lasix 80 mg daily, prior CVA, constipation, chronic low back pain, ambulates with a cane who presents to the ED with complaint of constipation, abdominal pain. ? ?Patient reports ongoing mid thoracic and lumbar back pain.  He is now having pain to his epigastric, left lower quadrant region.  Difficulty having a bowel movement, he has been having some straining with small amount of stool being produced.  Been taking Metamucil and stool softeners at home.  Tolerant p.o. intake.  No vomiting.  No significant chest pain.  He has gained around 8 pounds over the past week despite use of Lasix as prescribed.  He is urinating frequently throughout the day.  Sometimes has episodes of incontinence.  He urinates many times throughout the day.  No numbness or tingling, no saddle paresthesias.  No bowel incontinence.  He is ambulating his typical level.  No numbness to his legs.   ? ?Pain described as sharp, stabbing, "fullness."  Radiating from back to his abdomen. ? ? ? ? ?Past Medical History: ?No date: Anxiety ?No date: Arthritis ?    Comment:  "joints" ?No date: Asthma ?No date: CHF (congestive heart failure) ([place]) ?No date: Chronic back pain ?    Comment:  "mostly lower; have 2 bulging discs in my neck"  ?             (12/05/2013) ?No date: Chronic bronchitis ([place]) ?    Comment:  "get it q yr" ?No date: Cirrhosis ([place]) ?    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    Comment:  onset of early ?No date: Dementia [facility]) ?No date: Depression ?06/04/14: Fall at home ?    Comment:  Pt stated he falls off the  bed at nightly ?No date: Family history of adverse reaction to anesthesia ?    Comment:  Patients mother had a bad reaction to Anesthesia however ?             pt is unaware of reaction ?No date: Fibromyalgia ?No date: Frequent urination ?No date: Gastric ulcer ?No date: GERD (gastroesophageal reflux disease) ?No date: Headache(784.0) ?    Comment:  "weekly" (12/04/2013) ?No date: Hyperlipidemia ?No date: Hypertension ?No date: Kidney stone ?No date: Myocardial infarction [facility]) ?    Comment:  unsure about MI history, but had normal cath that  ?             admission '07 ([facility]) ?No date: Nausea & vomiting ?04/24/2013: OSA (obstructive sleep apnea) ?    Comment:  "use BiPAP" ?No date: Pancreatitis ?No date: Pneumonia ?    Comment:  "several times" ?11/22/2012: [facility] spotted fever) ?No date: Seasonal allergies ?No date: Shortness of breath dyspnea ?    Comment:  with ambulation ?03/2013: Stroke [facility]) ?    Comment:  "still weak on the left side" (12/05/2013) ?12/04/2013: Stroke ([place]) ?    Comment:  "came in today w/right sided weakness" (12/04/2013) ?No date: Type II diabetes mellitus ([place]) ? ?Past Surgical History: ?No date: CARDIAC CATHETERIZATION ?    Comment:  10/28/05: right dominant, normal LV systolic function,  ?             normal coronaries. (Dr. Baxter Hire, [facility]) ?No date: CHOLECYSTECTOMY ?No date: COLONOSCOPY ?07/08/2016: COLONOSCOPY WITH PROPOFOL; N/A ?    Comment:  Procedure: COLONOSCOPY WITH PROPOFOL;  Surgeon: Ofilia Neas  ?             Carlean Purl, MD;  Location: [facility];  Service:  ?             Endoscopy;  Laterality: N/A; ?No date: CYSTOSCOPY W/ STONE MANIPULATION ?06/18/2014: LUMBAR LAMINECTOMY/DECOMPRESSION MICRODISCECTOMY; Left ?    Comment:  Procedure: LUMBAR LAMINECTOMY/DECOMPRESSION  ?             MICRODISCECTOMY LEFT LUMBAR FIVE-SACRAL ONE;  Surgeon:  ?             Ashok Pall, MD;  Location: [facility];  Service:  ?             Neurosurgery;  Laterality: Left;  left ?No date: multiple cysts  removal-hip,wrist ?~ 1985: TONSILLECTOMY ?No date: UPPER GASTROINTESTINAL ENDOSCOPY  ? ? ?The history is provided by the patient and a friend. No language interpreter was used.  ?Abdominal Pain ?Associated symptoms: constipation   ?Associated symptoms: no chest pain, no chills, no cough, no fever, no hematuria, no nausea, no shortness of breath and no vomiting   ? ?  ? ?Home Medications ?Prior to Admission medications   ?Medication Sig Start Date End Date Taking? Authorizing Provider  ?dicyclomine (BENTYL) 20 MG tablet Take 0.5 tablets (10 mg total) by mouth 2 (two) times daily for 7 days. 09/22/21 09/29/21 Yes Wynona Dove A, DO  ?polyethylene glycol powder (MIRALAX) 17 GM/SCOOP powder Take 17 g by mouth daily for 7 days. 09/22/21 09/29/21 Yes Jeanell Sparrow, DO  ?ACCU-CHEK SOFTCLIX LANCETS lancets USE TO CHECK BLOOD-SUGAR UP TO 4 TIMES DAILY (DX CODE E11.65) 10/27/16   Golden Circle, FNP  ?albuterol (ACCUNEB) 1.25 MG/3ML nebulizer solution Take 1 ampule by nebulization every 6 (six) hours as needed for wheezing.    [provider]  ?amitriptyline (ELAVIL) 50 MG tablet Take 50 mg by mouth at bedtime. 04/03/20   [provider]  ?amLODipine (NORVASC) 5 MG tablet  03/09/20   [provider]  ?Celedonio Miyamoto 62.5-25 MCG/INH AEPB INHALE 1 PUFF INTO THE LUNGS DAILY. 02/09/17   Tanda Rockers, MD  ?bisoprolol (ZEBETA) 5 MG tablet TAKE 1 TABLET TWICE DAILY ?Patient taking differently: Take 5 mg by mouth in the morning and at bedtime.  02/09/17   Tanda Rockers, MD  ?blood glucose meter kit and supplies KIT Dispense based on patient and insurance preference. Use up to four times daily as directed. Dx Code: E11.65 09/19/16   Golden Circle, FNP  ?buprenorphine (BUTRANS) 10 MCG/HR PTWK APPLY 1 PATCH TO SKIN EVERY 7 DAYS 04/13/20   [provider]  ?citalopram (CELEXA) 40 MG tablet TAKE 1 TABLET (40 MG TOTAL) DAILY. ?Patient taking differently: Take 40 mg by mouth daily.  01/31/17   Golden Circle, FNP  ?clopidogrel (PLAVIX) 75 MG tablet TAKE 1 TABLET EVERY DAY ?Patient taking differently: Take 75 mg by mouth daily.  02/09/17   Golden Circle, FNP  ?docusate sodium (COLACE) 100 MG capsule Take 100 mg by mouth daily as needed for mild constipation.    [provider]  ?DULoxetine (CYMBALTA) 60 MG capsule Take 60 mg by mouth daily. 03/09/20  [provider]  ?empagliflozin (JARDIANCE) 25 MG TABS tablet Take 25 mg by mouth daily.    [provider]  ?fenofibrate 160 MG tablet Take 160 mg by mouth daily. 06/22/16   [provider]  ?FEROSUL 325 (65 Fe) MG tablet Take 325 mg by mouth 2 (two) times daily. 02/20/20   [provider]  ?finasteride (PROSCAR) 5 MG tablet Take 5 mg by mouth daily.    [provider]  ?fluticasone (FLONASE) 50 MCG/ACT nasal spray USE 2 SPRAYS IN EACH NOSTRIL ONE TIME DAILY AS NEEDED FOR ALLERGIES ?Patient taking differently: Place 1 spray into both nostrils daily.  12/09/16   Tanda Rockers, MD  ?furosemide (LASIX) 40 MG tablet TAKE 1/2 TABLET TWICE DAILY  (MUST  MAKE  APPOINTMENT) 12/26/19   Croitoru, Dani Gobble, MD  ?gabapentin (NEURONTIN) 800 MG tablet Take 800 mg by mouth 3 (three) times daily.    [provider]  ?glimepiride (AMARYL) 4 MG tablet Take 4 mg by mouth in the morning and at bedtime.    [provider]  ?glucose blood (ACCU-CHEK AVIVA PLUS) test strip USE TO CHECK BLOOD-SUGAR UP TO 4 TIMES DAILY (DX CODE E11.65) 02/15/17   Golden Circle, FNP  ?irbesartan (AVAPRO) 300 MG tablet TAKE 1 TABLET EVERY DAY ?Patient taking differently: Take 300 mg by mouth daily.  12/09/16   Tanda Rockers, MD  ?L-Lysine 1000 MG TABS Take 1,000 mg by mouth daily as needed (for fever blisters).    [provider]  ?liraglutide 18 MG/3ML SOPN Inject 0.3 mLs (1.8 mg total) into the skin daily. 08/15/16   Golden Circle, FNP  ?lisinopril (ZESTRIL) 40 MG tablet Take 40 mg by mouth 2 (two) times daily. 03/09/20    [provider]  ?LORazepam (ATIVAN) 1 MG tablet TAKE 1 TABLET EVERY 8 HOURS AS NEEDED FOR ANXIETY ?Patient taking differently: Take 1 mg by mouth every 8 (eight) hours as needed for anxiety.

## 2021-09-22 NOTE — Discharge Instructions (Addendum)
There are many causes of abdominal pain. Most pain is not serious and goes away, but some pain gets worse, changes, or will not go away. Please return to the emergency department or see your doctor right away if you (or your family member) experience any of the following:  ?1. Pain that gets worse or moves to just one spot.  ?2. Pain that gets worse if you cough or sneeze.  ?3. Pain with going over a bump in the road.  ?4. Pain that does not get better in 24 hours.  ?5. Inability to keep down liquids (vomiting)-especially if you are making less urine.  ?6. Fainting.  ?7. Blood in the vomit or stool.  ?8. High fever or shaking chills.  ?9. Swelling of the abdomen.  ?10. Any new or worsening problem.  ?  ?  ?Follow-up Instructions  ?See your primary care provider if not completely better in the next 2-3 days. Come to the ED if you are unable to see them in this time frame.  ?  ?Additional Instructions  ?No alcohol.  ?No caffeine, aspirin, or cigarettes. ?  ?Please return to the emergency department immediately for any new or concerning symptoms, or if you get worse. ?

## 2021-09-22 NOTE — ED Notes (Addendum)
Pt up to [facility] passed large amount soft stool. EDP notified. Reports decrease in pain ?

## 2021-09-22 NOTE — ED Notes (Signed)
Pt called x2 . In the bathroom ?

## 2021-09-22 NOTE — ED Notes (Signed)
Patient transported to CT 

## 2021-09-22 NOTE — ED Notes (Signed)
Soaps sud not administered due to results with fleets enema ?

## 2021-09-22 NOTE — ED Triage Notes (Addendum)
Right back pain that has felt like pain going through back into right upper abdomen for over 1 week. Pain increased right upper abdomen yesterday. Pt reports difficulty having a BM. Able to have small BM with straining  No vomiting or diarrhea. Nauseated. Pt has become incontinent of urine ?

## 2021-09-23 LAB — URINE CULTURE: Culture: NO GROWTH

## 2021-12-18 ENCOUNTER — Encounter (HOSPITAL_BASED_OUTPATIENT_CLINIC_OR_DEPARTMENT_OTHER): Payer: Self-pay | Admitting: Emergency Medicine

## 2021-12-18 ENCOUNTER — Emergency Department (HOSPITAL_BASED_OUTPATIENT_CLINIC_OR_DEPARTMENT_OTHER): Payer: Medicare HMO

## 2021-12-18 ENCOUNTER — Emergency Department (HOSPITAL_BASED_OUTPATIENT_CLINIC_OR_DEPARTMENT_OTHER)
Admission: EM | Admit: 2021-12-18 | Discharge: 2021-12-18 | Disposition: A | Discharge status: Home / Self Care | Payer: Medicare HMO | Attending: Emergency Medicine | Admitting: Emergency Medicine

## 2021-12-18 ENCOUNTER — Other Ambulatory Visit: Payer: Self-pay

## 2021-12-18 DIAGNOSIS — F0390 Unspecified dementia without behavioral disturbance: Secondary | ICD-10-CM | POA: Diagnosis not present

## 2021-12-18 DIAGNOSIS — I110 Hypertensive heart disease with heart failure: Secondary | ICD-10-CM | POA: Insufficient documentation

## 2021-12-18 DIAGNOSIS — Z79899 Other long term (current) drug therapy: Secondary | ICD-10-CM | POA: Insufficient documentation

## 2021-12-18 DIAGNOSIS — E1165 Type 2 diabetes mellitus with hyperglycemia: Secondary | ICD-10-CM | POA: Insufficient documentation

## 2021-12-18 DIAGNOSIS — Z7902 Long term (current) use of antithrombotics/antiplatelets: Secondary | ICD-10-CM | POA: Insufficient documentation

## 2021-12-18 DIAGNOSIS — R079 Chest pain, unspecified: Secondary | ICD-10-CM

## 2021-12-18 DIAGNOSIS — R739 Hyperglycemia, unspecified: Secondary | ICD-10-CM

## 2021-12-18 DIAGNOSIS — Z7951 Long term (current) use of inhaled steroids: Secondary | ICD-10-CM | POA: Insufficient documentation

## 2021-12-18 DIAGNOSIS — R0789 Other chest pain: Secondary | ICD-10-CM | POA: Diagnosis present

## 2021-12-18 DIAGNOSIS — J449 Chronic obstructive pulmonary disease, unspecified: Secondary | ICD-10-CM | POA: Insufficient documentation

## 2021-12-18 DIAGNOSIS — Z7984 Long term (current) use of oral hypoglycemic drugs: Secondary | ICD-10-CM | POA: Insufficient documentation

## 2021-12-18 DIAGNOSIS — I509 Heart failure, unspecified: Secondary | ICD-10-CM | POA: Diagnosis not present

## 2021-12-18 LAB — CBG MONITORING, ED
Glucose-Capillary: 354 mg/dL — ABNORMAL HIGH (ref 70–99)
Glucose-Capillary: 350 mg/dL — ABNORMAL HIGH (ref 70–99)
Glucose-Capillary: 314 mg/dL — ABNORMAL HIGH (ref 70–99)
Glucose-Capillary: 205 mg/dL — ABNORMAL HIGH (ref 70–99)
Glucose-Capillary: 157 mg/dL — ABNORMAL HIGH (ref 70–99)

## 2021-12-18 LAB — CBC
HCT: 43.1 % (ref 39.0–52.0)
Hemoglobin: 15.2 g/dL (ref 13.0–17.0)
MCH: 31 pg (ref 26.0–34.0)
MCHC: 35.3 g/dL (ref 30.0–36.0)
MCV: 87.8 fL (ref 80.0–100.0)
Platelets: 150 10*3/uL (ref 150–400)
RBC: 4.91 MIL/uL (ref 4.22–5.81)
RDW: 12.5 % (ref 11.5–15.5)
WBC: 5 10*3/uL (ref 4.0–10.5)
nRBC: 0 % (ref 0.0–0.2)

## 2021-12-18 LAB — BASIC METABOLIC PANEL
Anion gap: 8 (ref 5–15)
BUN: 10 mg/dL (ref 8–23)
CO2: 27 mmol/L (ref 22–32)
Calcium: 8.9 mg/dL (ref 8.9–10.3)
Chloride: 98 mmol/L (ref 98–111)
Creatinine, Ser: 0.62 mg/dL (ref 0.61–1.24)
GFR, Estimated: >60 mL/min (ref >60)
Glucose, Bld: 378 mg/dL — ABNORMAL HIGH (ref 70–99)
Potassium: 4.1 mmol/L (ref 3.5–5.1)
Sodium: 133 mmol/L — ABNORMAL LOW (ref 135–145)

## 2021-12-18 LAB — URINALYSIS, ROUTINE W REFLEX MICROSCOPIC
Bilirubin Urine: NEGATIVE
Glucose, UA: >=500 mg/dL — AB
Hgb urine dipstick: NEGATIVE
Ketones, ur: NEGATIVE mg/dL
Leukocytes,Ua: NEGATIVE
Nitrite: NEGATIVE
Protein, ur: NEGATIVE mg/dL
Specific Gravity, Urine: 1.015 (ref 1.005–1.030)
pH: 7.5 (ref 5.0–8.0)

## 2021-12-18 LAB — TROPONIN I (HIGH SENSITIVITY)
Troponin I (High Sensitivity): 3 ng/L (ref <18)
Troponin I (High Sensitivity): 4 ng/L (ref <18)

## 2021-12-18 LAB — URINALYSIS, MICROSCOPIC (REFLEX)
Bacteria, UA: NONE SEEN
RBC / HPF: NONE SEEN RBC/hpf (ref 0–5)
WBC, UA: NONE SEEN WBC/hpf (ref 0–5)

## 2021-12-18 MED ORDER — DEXTROSE IN LACTATED RINGERS 5 % IV SOLN: INTRAVENOUS | Status: DC

## 2021-12-18 MED ORDER — LACTATED RINGERS IV BOLUS
500.0000 mL | Freq: Once | INTRAVENOUS | Status: AC
  Administered 2021-12-18: 500 mL via INTRAVENOUS

## 2021-12-18 MED ORDER — INSULIN REGULAR(HUMAN) IN NACL 100-0.9 UT/100ML-% IV SOLN
INTRAVENOUS | Status: DC
  Administered 2021-12-18: 19 [IU]/h via INTRAVENOUS
  Filled 2021-12-18: qty 100

## 2021-12-18 MED ORDER — LACTATED RINGERS IV SOLN: INTRAVENOUS | Status: DC

## 2021-12-18 MED ORDER — DEXTROSE 50 % IV SOLN: 0.0000 mL | INTRAVENOUS | Status: DC | PRN

## 2021-12-18 NOTE — ED Provider Notes (Signed)
[facility] Provider Note   CSN: [id number] Arrival date & time: 12/18/21  1319     History  Chief Complaint  Patient presents with   Chest Pain   Hyperglycemia    LAMOUNT BANKSON is a 61 y.o. male.  Patient presents to the hospital complaining of hyperglycemia and left-sided chest pain.  Patient states that his blood sugar has been in the 400-500 range over the past 2 weeks.  He has had a recent change in his medication due to cost and is currently taking liraglutide instead of Jardiance due to cost issues.  He states that since switching back to the liraglutide his glucose has been more poorly controlled.  He was recently seen by his primary care has adjusted he change his diet.  He states that his appetite has been down.  He states that yesterday he ate a chicken sandwich today he had a honeydew.  He endorses nausea but denies vomiting, abdominal pain, diarrhea, urinary symptoms.  The patient also endorses intermittent left-sided chest pain which is been ongoing for 6 weeks or more.  He states that he has sharp pain that runs down from the left side of his neck into his chest and occasionally into his left arm.  He also complains of intermittent numbness and weakness in the left arm and has a history of a previous stroke which left him with some left-sided residual deficits.  He does not know of any inciting factors for his chest pain and states that it resolves on its own.  The patient has past medical history significant for previous MI, COPD, dementia, stroke, chronic back pain, type II DM, GERD, fibromyalgia, CHF, cirrhosis, pancreatitis, hypertension  HPI     Home Medications Prior to Admission medications   Medication Sig Start Date End Date Taking? Authorizing Provider  ACCU-CHEK SOFTCLIX LANCETS lancets USE TO CHECK BLOOD-SUGAR UP TO 4 TIMES DAILY (DX CODE E11.65) 10/27/16   Golden Circle, FNP  albuterol (ACCUNEB) 1.25 MG/3ML nebulizer solution  Take 1 ampule by nebulization every 6 (six) hours as needed for wheezing.    [provider]  amitriptyline (ELAVIL) 50 MG tablet Take 50 mg by mouth at bedtime. 04/03/20   [provider]  amLODipine (NORVASC) 5 MG tablet  03/09/20   [provider]  ANORO ELLIPTA 62.5-25 MCG/INH AEPB INHALE 1 PUFF INTO THE LUNGS DAILY. 02/09/17   Tanda Rockers, MD  bisoprolol (ZEBETA) 5 MG tablet TAKE 1 TABLET TWICE DAILY Patient taking differently: Take 5 mg by mouth in the morning and at bedtime.  02/09/17   Tanda Rockers, MD  blood glucose meter kit and supplies KIT Dispense based on patient and insurance preference. Use up to four times daily as directed. Dx Code: E11.65 09/19/16   Golden Circle, FNP  buprenorphine (BUTRANS) 10 MCG/HR PTWK APPLY 1 PATCH TO SKIN EVERY 7 DAYS 04/13/20   [provider]  citalopram (CELEXA) 40 MG tablet TAKE 1 TABLET (40 MG TOTAL) DAILY. Patient taking differently: Take 40 mg by mouth daily.  01/31/17   Golden Circle, FNP  clopidogrel (PLAVIX) 75 MG tablet TAKE 1 TABLET EVERY DAY Patient taking differently: Take 75 mg by mouth daily.  02/09/17   Golden Circle, FNP  dicyclomine (BENTYL) 20 MG tablet Take 0.5 tablets (10 mg total) by mouth 2 (two) times daily for 7 days. 09/22/21 09/29/21  Jeanell Sparrow, DO  docusate sodium (COLACE) 100 MG capsule Take 100  mg by mouth daily as needed for mild constipation.    [provider]  DULoxetine (CYMBALTA) 60 MG capsule Take 60 mg by mouth daily. 03/09/20   [provider]  empagliflozin (JARDIANCE) 25 MG TABS tablet Take 25 mg by mouth daily.    [provider]  fenofibrate 160 MG tablet Take 160 mg by mouth daily. 06/22/16   [provider]  FEROSUL 325 (65 Fe) MG tablet Take 325 mg by mouth 2 (two) times daily. 02/20/20   [provider]  finasteride (PROSCAR) 5 MG tablet Take 5 mg by mouth daily.    [provider]  fluticasone (FLONASE)  50 MCG/ACT nasal spray USE 2 SPRAYS IN EACH NOSTRIL ONE TIME DAILY AS NEEDED FOR ALLERGIES Patient taking differently: Place 1 spray into both nostrils daily.  12/09/16   Tanda Rockers, MD  furosemide (LASIX) 40 MG tablet TAKE 1/2 TABLET TWICE DAILY  (MUST  MAKE  APPOINTMENT) 12/26/19   Croitoru, Dani Gobble, MD  gabapentin (NEURONTIN) 800 MG tablet Take 800 mg by mouth 3 (three) times daily.    [provider]  glimepiride (AMARYL) 4 MG tablet Take 4 mg by mouth in the morning and at bedtime.    [provider]  glucose blood (ACCU-CHEK AVIVA PLUS) test strip USE TO CHECK BLOOD-SUGAR UP TO 4 TIMES DAILY (DX CODE E11.65) 02/15/17   Golden Circle, FNP  irbesartan (AVAPRO) 300 MG tablet TAKE 1 TABLET EVERY DAY Patient taking differently: Take 300 mg by mouth daily.  12/09/16   Tanda Rockers, MD  L-Lysine 1000 MG TABS Take 1,000 mg by mouth daily as needed (for fever blisters).    [provider]  liraglutide 18 MG/3ML SOPN Inject 0.3 mLs (1.8 mg total) into the skin daily. 08/15/16   Golden Circle, FNP  lisinopril (ZESTRIL) 40 MG tablet Take 40 mg by mouth 2 (two) times daily. 03/09/20   [provider]  LORazepam (ATIVAN) 1 MG tablet TAKE 1 TABLET EVERY 8 HOURS AS NEEDED FOR ANXIETY Patient taking differently: Take 1 mg by mouth every 8 (eight) hours as needed for anxiety.  02/01/17   Golden Circle, FNP  LYRICA 75 MG capsule TAKE 1 CAPSULE TWICE DAILY Patient taking differently: Take 75 mg by mouth 2 (two) times daily.  02/21/17   Golden Circle, FNP  meloxicam (MOBIC) 7.5 MG tablet Take 7.5 mg by mouth daily. 04/22/20   [provider]  memantine (NAMENDA) 10 MG tablet TAKE 1 TABLET TWICE DAILY Patient taking differently: Take 10 mg by mouth 2 (two) times daily.  01/31/17   Golden Circle, FNP  methocarbamol (ROBAXIN) 750 MG tablet Take 750 mg by mouth in the morning and at bedtime.    [provider]  ondansetron (ZOFRAN ODT) 4 MG  disintegrating tablet Take 1 tablet (4 mg total) by mouth every 8 (eight) hours as needed for nausea or vomiting. 11/22/16   Golden Circle, FNP  oxybutynin (DITROPAN) 5 MG tablet Take 5 mg by mouth 2 (two) times daily. 03/05/20   [provider]  OXYGEN 2lpm with sleep and occ as needed during the day [place]    [provider]  pantoprazole (PROTONIX) 40 MG tablet TAKE 1 TABLET EVERY DAY Patient taking differently: Take 40 mg by mouth daily.  01/31/17   Golden Circle, FNP  Potassium Gluconate 595 MG CAPS Take 2 capsules by mouth daily.    [provider]  promethazine (PHENERGAN)  25 MG tablet Take 1 tablet (25 mg total) by mouth every 6 (six) hours as needed for nausea or vomiting. 02/11/14   Ward, Delice Bison, DO  Specialty Vitamins Products (MAGNESIUM, AMINO ACID CHELATE,) 133 MG tablet Take 1 tablet by mouth 2 (two) times daily.    [provider]  sucralfate (CARAFATE) 1 g tablet Take 1 g by mouth 4 (four) times daily -  with meals and at bedtime.    [provider]  VENTOLIN HFA 108 (90 Base) MCG/ACT inhaler INHALE 2 PUFFS EVERY 6 HOURS AS NEEDED Patient taking differently: Inhale 2 puffs into the lungs every 6 (six) hours as needed for shortness of breath.  12/16/16   Parrett, Fonnie Mu, NP      Allergies    Actos [pioglitazone] and Aricept [donepezil hcl]    Review of Systems   Review of Systems  Constitutional:  Negative for fever.  Respiratory:  Positive for shortness of breath. Negative for cough and wheezing.   Cardiovascular:  Positive for chest pain. Negative for palpitations and leg swelling.  Gastrointestinal:  Positive for nausea. Negative for abdominal pain, constipation, diarrhea and vomiting.  Genitourinary:  Negative for dysuria.  Neurological:  Negative for syncope.    Physical Exam Updated Vital Signs BP (!) 171/99   Pulse 78   Temp 97.8 F (36.6 C) (Oral)   Resp 16   Ht 5' 8" (1.727 m)   Wt 109.8 kg   SpO2 99%    BMI 36.80 kg/m  Physical Exam Vitals and nursing note reviewed.  Constitutional:      Appearance: He is ill-appearing.  HENT:     Head: Normocephalic and atraumatic.  Eyes:     Extraocular Movements: Extraocular movements intact.  Neck:     Vascular: No JVD.  Cardiovascular:     Rate and Rhythm: Normal rate and regular rhythm.     Heart sounds: Normal heart sounds.  Pulmonary:     Effort: Pulmonary effort is normal. No respiratory distress.     Breath sounds: Normal breath sounds. No decreased breath sounds, wheezing, rhonchi or rales.  Chest:     Chest wall: No tenderness.  Abdominal:     Palpations: Abdomen is soft.     Tenderness: There is no abdominal tenderness.  Musculoskeletal:     Cervical back: Normal range of motion and neck supple.  Lymphadenopathy:     Cervical: No cervical adenopathy.  Skin:    General: Skin is warm and dry.     Capillary Refill: Capillary refill takes less than 2 seconds.  Neurological:     Mental Status: He is alert.     ED Results / Procedures / Treatments   Labs (all labs ordered are listed, but only abnormal results are displayed) Labs Reviewed  BASIC METABOLIC PANEL - Abnormal; Notable for the following components:      Result Value   Sodium 133 (*)    Glucose, Bld 378 (*)    All other components within normal limits  URINALYSIS, ROUTINE W REFLEX MICROSCOPIC - Abnormal; Notable for the following components:   Glucose, UA >=500 (*)    All other components within normal limits  CBG MONITORING, ED - Abnormal; Notable for the following components:   Glucose-Capillary 354 (*)    All other components within normal limits  CBG MONITORING, ED - Abnormal; Notable for the following components:   Glucose-Capillary 350 (*)    All other components within normal limits  CBG MONITORING, ED -  Abnormal; Notable for the following components:   Glucose-Capillary 314 (*)    All other components within normal limits  CBG MONITORING, ED -  Abnormal; Notable for the following components:   Glucose-Capillary 205 (*)    All other components within normal limits  CBG MONITORING, ED - Abnormal; Notable for the following components:   Glucose-Capillary 157 (*)    All other components within normal limits  CBC  URINALYSIS, MICROSCOPIC (REFLEX)  TROPONIN I (HIGH SENSITIVITY)  TROPONIN I (HIGH SENSITIVITY)    EKG EKG Interpretation  Date/Time:  Saturday December 18 2021 13:28:59 EDT Ventricular Rate:  79 PR Interval:  136 QRS Duration: 86 QT Interval:  374 QTC Calculation: 428 R Axis:   80 Text Interpretation: Normal sinus rhythm Normal ECG When compared with ECG of 22-Sep-2021 08:05, PREVIOUS ECG IS PRESENT no change from previous Confirmed by Charlesetta Shanks [phone]) on 12/18/2021 2:17:20 PM  Radiology DG Chest 2 View  Result Date: 12/18/2021 CLINICAL DATA:  A 61 year old male presents for evaluation of chest pain. EXAM: CHEST - 2 VIEW COMPARISON:  Sep 22, 2021. FINDINGS: EKG leads project over the chest. No sign of lobar consolidation or evidence of pleural effusion. Cardiomediastinal contours and hilar structures are normal. No pneumothorax. On limited assessment no acute skeletal findings. IMPRESSION: No acute cardiopulmonary disease. Electronically Signed   By: Zetta Bills M.D.   On: 12/18/2021 14:25    Procedures Procedures    Medications Ordered in ED Medications  lactated ringers bolus 500 mL (500 mLs Intravenous New Bag/Given 12/18/21 1653)    ED Course/ Medical Decision Making/ A&P                           Medical Decision Making Amount and/or Complexity of Data Reviewed Labs: ordered. Radiology: ordered.  Risk Prescription drug management.   This patient presents to the ED for concern of hyperglycemia, this involves an extensive number of treatment options, and is a complaint that carries with it a high risk of complications and morbidity.  The differential diagnosis includes hyperglycemia, DKA, HHS,  and others.  The patient also complains of chest pain with differential including but not limited to ACS, PE, pneumonia, musculoskeletal pain, and others   Co morbidities that complicate the patient evaluation  History of previous stroke, previous MI, CHF, COPD, poorly controlled diabetes   Additional history obtained:  Additional history obtained from visitor at bedside External records from outside source obtained and reviewed including lab results from previous visits showing the most recent A1c of 8.8   Lab Tests:  I Ordered, and personally interpreted labs.  The pertinent results include: Initial CBG 354, sodium 133, grossly normal CBC, initial troponin of 3, repeat troponin 4, greater than 500 glucose on urinalysis, no sign of infection on urinalysis, CBG at final check 157   Imaging Studies ordered:  I ordered imaging studies including chest x-ray I independently visualized and interpreted imaging which showed no active cardiopulmonary disease I agree with the radiologist interpretation   Cardiac Monitoring: / EKG:  The patient was maintained on a cardiac monitor.  I personally viewed and interpreted the cardiac monitored which showed an underlying rhythm of: Sinus rhythm   Problem List / ED Course / Critical interventions / Medication management   I ordered medication including insulin, LR, for hyperglycemia Reevaluation of the patient after these medicines showed that the patient improved I have reviewed the patients home medicines and have made adjustments as  needed   Social Determinants of Health:  Patient has difficulty affording co-pays on some medication due to his medical cost along with his wife's medical cost   Test / Admission - Considered:  The patient's hyperglycemia resolved after insulin administration with LR boluses.  His lab work did not suggest DKA or HHS.  I believe his hyperglycemia is due to medication issues along with possibly diet.  The  patient feels comfortable with discharge home and I believe this is reasonable.  He will follow-up with his endocrinologist further management of his hyperglycemia.  No sign of ACS.  EKG shows no ischemic patterns and troponins were 3 and 4, showing no signs of ischemia or myocardial damage.  The patient's pain is intermittent and has been ongoing for over a month.  Very low clinical suspicion of PE.  Patient is not tachycardic and is not short of breath at this time.  There may be some musculoskeletal involvement but this is not clear.  Patient plans to follow-up with his cardiologist.  I see no indication for admission at this time as patient's pain is currently resolved.  Plan to discharge patient home with endocrinology and cardiology follow-ups.  Patient is in agreement with plan.  Discharge home        Final Clinical Impression(s) / ED Diagnoses Final diagnoses:  Chest pain, unspecified type  Hyperglycemia    Rx / DC Orders ED Discharge Orders     None         Ronny Bacon 12/18/21 1754    Charlesetta Shanks, MD 12/22/21 1032

## 2021-12-18 NOTE — ED Notes (Signed)
Discharge instructions reviewed with patient. Patient verbalizes understanding, no further questions at this time. Medications and follow up information provided. No acute distress noted at time of departure.

## 2021-12-18 NOTE — Discharge Instructions (Signed)
You were seen today for hyperglycemia and chest pain.  Work-up shows no sign of acute coronary syndrome which is reassuring.  There were no signs of diabetic ketoacidosis or HHS on your work-up of hyperglycemia.  Please continue to take your medications as prescribed at home and continue to monitor your diet.  I strongly recommend follow-up with your PCP, endocrinologist, and cardiologist.  You may need medication changes which would best be evaluated by endocrinology.  Cardiology would be able to decide if further evaluation is needed for your intermittent chest pain.  If your chest pain worsens, you develop shortness of breath, or develop other life threats, please return for further evaluation

## 2021-12-18 NOTE — ED Notes (Signed)
Canceling endotool and Insulin drip per provider, Cherlynn June

## 2021-12-18 NOTE — ED Triage Notes (Signed)
Patient states his blood sugar has been running in the 400's for the last 2 weeks, was seen by his PCP and was told to change his diet. Reports he has had no appetite. Patient also c/o intermittent chest pain for the last month, sharp pain that runs down the left side of his neck into his chest and numbness in his left arm.

## 2022-02-09 ENCOUNTER — Emergency Department (HOSPITAL_BASED_OUTPATIENT_CLINIC_OR_DEPARTMENT_OTHER): Payer: Medicare HMO

## 2022-02-09 ENCOUNTER — Other Ambulatory Visit: Payer: Self-pay

## 2022-02-09 ENCOUNTER — Emergency Department (HOSPITAL_BASED_OUTPATIENT_CLINIC_OR_DEPARTMENT_OTHER)
Admission: EM | Admit: 2022-02-09 | Discharge: 2022-02-10 | Disposition: A | Discharge status: Home / Self Care | Payer: Medicare HMO | Attending: Emergency Medicine | Admitting: Emergency Medicine

## 2022-02-09 ENCOUNTER — Encounter (HOSPITAL_BASED_OUTPATIENT_CLINIC_OR_DEPARTMENT_OTHER): Payer: Self-pay | Admitting: Emergency Medicine

## 2022-02-09 DIAGNOSIS — E119 Type 2 diabetes mellitus without complications: Secondary | ICD-10-CM | POA: Diagnosis not present

## 2022-02-09 DIAGNOSIS — R0789 Other chest pain: Secondary | ICD-10-CM | POA: Insufficient documentation

## 2022-02-09 DIAGNOSIS — I110 Hypertensive heart disease with heart failure: Secondary | ICD-10-CM | POA: Diagnosis not present

## 2022-02-09 DIAGNOSIS — J449 Chronic obstructive pulmonary disease, unspecified: Secondary | ICD-10-CM | POA: Insufficient documentation

## 2022-02-09 DIAGNOSIS — Z7902 Long term (current) use of antithrombotics/antiplatelets: Secondary | ICD-10-CM | POA: Insufficient documentation

## 2022-02-09 DIAGNOSIS — Z7951 Long term (current) use of inhaled steroids: Secondary | ICD-10-CM | POA: Insufficient documentation

## 2022-02-09 DIAGNOSIS — R079 Chest pain, unspecified: Secondary | ICD-10-CM | POA: Diagnosis present

## 2022-02-09 DIAGNOSIS — F0390 Unspecified dementia without behavioral disturbance: Secondary | ICD-10-CM | POA: Insufficient documentation

## 2022-02-09 DIAGNOSIS — I509 Heart failure, unspecified: Secondary | ICD-10-CM | POA: Diagnosis not present

## 2022-02-09 DIAGNOSIS — J45909 Unspecified asthma, uncomplicated: Secondary | ICD-10-CM | POA: Insufficient documentation

## 2022-02-09 DIAGNOSIS — R072 Precordial pain: Secondary | ICD-10-CM

## 2022-02-09 DIAGNOSIS — Z79899 Other long term (current) drug therapy: Secondary | ICD-10-CM | POA: Diagnosis not present

## 2022-02-09 LAB — BASIC METABOLIC PANEL
Anion gap: 7 (ref 5–15)
BUN: 17 mg/dL (ref 8–23)
CO2: 27 mmol/L (ref 22–32)
Calcium: 8.7 mg/dL — ABNORMAL LOW (ref 8.9–10.3)
Chloride: 102 mmol/L (ref 98–111)
Creatinine, Ser: 0.82 mg/dL (ref 0.61–1.24)
GFR, Estimated: >60 mL/min (ref >60)
Glucose, Bld: 370 mg/dL — ABNORMAL HIGH (ref 70–99)
Potassium: 3.8 mmol/L (ref 3.5–5.1)
Sodium: 136 mmol/L (ref 135–145)

## 2022-02-09 LAB — CBC
HCT: 40.9 % (ref 39.0–52.0)
Hemoglobin: 13.7 g/dL (ref 13.0–17.0)
MCH: 30.9 pg (ref 26.0–34.0)
MCHC: 33.5 g/dL (ref 30.0–36.0)
MCV: 92.1 fL (ref 80.0–100.0)
Platelets: 149 10*3/uL — ABNORMAL LOW (ref 150–400)
RBC: 4.44 MIL/uL (ref 4.22–5.81)
RDW: 12.9 % (ref 11.5–15.5)
WBC: 5.3 10*3/uL (ref 4.0–10.5)
nRBC: 0 % (ref 0.0–0.2)

## 2022-02-09 LAB — GLUCOSE, CAPILLARY: Glucose-Capillary: 375 mg/dL — ABNORMAL HIGH (ref 70–99)

## 2022-02-09 LAB — TROPONIN I (HIGH SENSITIVITY): Troponin I (High Sensitivity): 4 ng/L (ref <18)

## 2022-02-09 MED ORDER — IOHEXOL 350 MG/ML SOLN: 100.0000 mL | Freq: Once | INTRAVENOUS | Status: DC | PRN

## 2022-02-09 MED ORDER — ALUM & MAG HYDROXIDE-SIMETH 200-200-20 MG/5ML PO SUSP
30.0000 mL | Freq: Once | ORAL | Status: AC
  Administered 2022-02-10: 30 mL via ORAL
  Filled 2022-02-09: qty 30

## 2022-02-09 MED ORDER — IOHEXOL 350 MG/ML SOLN
125.0000 mL | Freq: Once | INTRAVENOUS | Status: AC | PRN
  Administered 2022-02-09: 125 mL via INTRAVENOUS

## 2022-02-09 MED ORDER — KETOROLAC TROMETHAMINE 30 MG/ML IJ SOLN
30.0000 mg | Freq: Once | INTRAMUSCULAR | Status: AC
  Administered 2022-02-10: 30 mg via INTRAVENOUS
  Filled 2022-02-09: qty 1

## 2022-02-09 NOTE — ED Triage Notes (Signed)
Constant sharp left sided chest pain that radiates down left arm x 30 minutes. Pain started at rest. Pt also endorses nausea, shob, and left arm numbness.

## 2022-02-10 LAB — TROPONIN I (HIGH SENSITIVITY): Troponin I (High Sensitivity): 4 ng/L (ref <18)

## 2022-02-10 NOTE — ED Provider Notes (Addendum)
[name] Provider Note   CSN: [id number] Arrival date & time: 02/09/22  2201     History  Chief Complaint  Patient presents with   Chest Pain    Bryan Becker is a 61 y.o. male.  The history is provided by the patient.  Chest Pain Pain location:  L chest Pain quality: pressure and sharp   Pain radiates to:  L arm Pain severity:  Moderate Onset quality:  Sudden Duration:  3 hours Timing:  Constant Progression:  Unchanged Chronicity:  New Context: at rest   Context comment:  Last meal was tacos at 3 pm Relieved by:  Nothing Worsened by:  Nothing Ineffective treatments:  None tried Associated symptoms: no back pain, no fever, no vomiting and no weakness   Risk factors: male sex   Patient with GERD, anxiety, diabetes and dementia presents with L sided chest pain at rest.  No exertional symptom.  No vomiting or diaphoresis.      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[facility]"   Confusion    COPD (chronic obstructive pulmonary disease) ([place])    Dementia ([place])    onset of early   Dementia ([place])    Depression    Fall at home 06/04/14   Pt stated he falls off the bed at nightly   Family history of adverse reaction to anesthesia    Patients mother had a bad reaction to Anesthesia however pt is unaware of reaction   Fibromyalgia    Frequent urination    Gastric ulcer    GERD (gastroesophageal reflux disease)    Headache(784.0)    "weekly" (12/04/2013)   Hyperlipidemia    Hypertension    Kidney stone    Myocardial infarction [facility])    unsure about MI history, but had normal cath that admission '07 ([facility])   Nausea & vomiting    OSA (obstructive sleep apnea) 04/24/2013    "use BiPAP"   Pancreatitis    Pneumonia    "several times"   RMSF [facility] spotted fever) 11/22/2012   Seasonal allergies    Shortness of breath dyspnea    with ambulation   Stroke [facility]) 03/2013   "still weak on the left side" (12/05/2013)   Stroke ([place]) 12/04/2013   "came in today w/right sided weakness" (12/04/2013)   Type II diabetes mellitus ([place])     Home Medications Prior to Admission medications   Medication Sig Start Date End Date Taking? Authorizing Provider  ACCU-CHEK SOFTCLIX LANCETS lancets USE TO CHECK BLOOD-SUGAR UP TO 4 TIMES DAILY (DX CODE E11.65) 10/27/16   Golden Circle, FNP  albuterol (ACCUNEB) 1.25 MG/3ML nebulizer solution Take 1 ampule by nebulization every 6 (six) hours as needed for wheezing.    [provider]  amitriptyline (ELAVIL) 50 MG tablet Take 50 mg by mouth at bedtime. 04/03/20   [provider]  amLODipine (NORVASC) 5 MG tablet  03/09/20   [provider]  ANORO ELLIPTA 62.5-25 MCG/INH AEPB INHALE 1 PUFF INTO THE LUNGS DAILY. 02/09/17   Tanda Rockers, MD  bisoprolol (ZEBETA) 5 MG tablet TAKE 1  TABLET TWICE DAILY Patient taking differently: Take 5 mg by mouth in the morning and at bedtime.  02/09/17   Tanda Rockers, MD  blood glucose meter kit and supplies KIT Dispense based on patient and insurance preference. Use up to four times daily as directed. Dx Code: E11.65 09/19/16   Golden Circle, FNP  buprenorphine (BUTRANS) 10 MCG/HR PTWK APPLY 1 PATCH TO SKIN EVERY 7 DAYS 04/13/20   [provider]  citalopram (CELEXA) 40 MG tablet TAKE 1 TABLET (40 MG TOTAL) DAILY. Patient taking differently: Take 40 mg by mouth daily.  01/31/17   Golden Circle, FNP  clopidogrel (PLAVIX) 75 MG tablet TAKE 1 TABLET EVERY DAY Patient taking differently: Take 75 mg by mouth daily.  02/09/17   Golden Circle, FNP  dicyclomine (BENTYL) 20 MG tablet Take 0.5 tablets (10 mg total) by mouth 2 (two) times daily for 7 days.  09/22/21 09/29/21  Jeanell Sparrow, DO  docusate sodium (COLACE) 100 MG capsule Take 100 mg by mouth daily as needed for mild constipation.    [provider]  DULoxetine (CYMBALTA) 60 MG capsule Take 60 mg by mouth daily. 03/09/20   [provider]  empagliflozin (JARDIANCE) 25 MG TABS tablet Take 25 mg by mouth daily.    [provider]  fenofibrate 160 MG tablet Take 160 mg by mouth daily. 06/22/16   [provider]  FEROSUL 325 (65 Fe) MG tablet Take 325 mg by mouth 2 (two) times daily. 02/20/20   [provider]  finasteride (PROSCAR) 5 MG tablet Take 5 mg by mouth daily.    [provider]  fluticasone (FLONASE) 50 MCG/ACT nasal spray USE 2 SPRAYS IN EACH NOSTRIL ONE TIME DAILY AS NEEDED FOR ALLERGIES Patient taking differently: Place 1 spray into both nostrils daily.  12/09/16   Tanda Rockers, MD  furosemide (LASIX) 40 MG tablet TAKE 1/2 TABLET TWICE DAILY  (MUST  MAKE  APPOINTMENT) 12/26/19   Croitoru, Dani Gobble, MD  gabapentin (NEURONTIN) 800 MG tablet Take 800 mg by mouth 3 (three) times daily.    [provider]  glimepiride (AMARYL) 4 MG tablet Take 4 mg by mouth in the morning and at bedtime.    [provider]  glucose blood (ACCU-CHEK AVIVA PLUS) test strip USE TO CHECK BLOOD-SUGAR UP TO 4 TIMES DAILY (DX CODE E11.65) 02/15/17   Golden Circle, FNP  irbesartan (AVAPRO) 300 MG tablet TAKE 1 TABLET EVERY DAY Patient taking differently: Take 300 mg by mouth daily.  12/09/16   Tanda Rockers, MD  L-Lysine 1000 MG TABS Take 1,000 mg by mouth daily as needed (for fever blisters).    [provider]  liraglutide 18 MG/3ML SOPN Inject 0.3 mLs (1.8 mg total) into the skin daily. 08/15/16   Golden Circle, FNP  lisinopril (ZESTRIL) 40 MG tablet Take 40 mg by mouth 2 (two) times daily. 03/09/20   [provider]  LORazepam (ATIVAN) 1 MG tablet TAKE 1 TABLET EVERY 8 HOURS AS NEEDED FOR ANXIETY Patient taking  differently: Take 1 mg by mouth every 8 (eight) hours as needed for anxiety.  02/01/17   Golden Circle, FNP  LYRICA 75 MG capsule TAKE 1 CAPSULE TWICE DAILY Patient taking differently: Take 75 mg by mouth 2 (two) times daily.  02/21/17   Golden Circle, FNP  meloxicam (MOBIC) 7.5 MG tablet Take 7.5 mg by mouth daily. 04/22/20   [provider]  memantine [facility])  10 MG tablet TAKE 1 TABLET TWICE DAILY Patient taking differently: Take 10 mg by mouth 2 (two) times daily.  01/31/17   Golden Circle, FNP  methocarbamol (ROBAXIN) 750 MG tablet Take 750 mg by mouth in the morning and at bedtime.    [provider]  ondansetron (ZOFRAN ODT) 4 MG disintegrating tablet Take 1 tablet (4 mg total) by mouth every 8 (eight) hours as needed for nausea or vomiting. 11/22/16   Golden Circle, FNP  oxybutynin (DITROPAN) 5 MG tablet Take 5 mg by mouth 2 (two) times daily. 03/05/20   [provider]  OXYGEN 2lpm with sleep and occ as needed during the day [place]    [provider]  pantoprazole (PROTONIX) 40 MG tablet TAKE 1 TABLET EVERY DAY Patient taking differently: Take 40 mg by mouth daily.  01/31/17   Golden Circle, FNP  Potassium Gluconate 595 MG CAPS Take 2 capsules by mouth daily.    [provider]  promethazine (PHENERGAN) 25 MG tablet Take 1 tablet (25 mg total) by mouth every 6 (six) hours as needed for nausea or vomiting. 02/11/14   Ward, Delice Bison, DO  Specialty Vitamins Products (MAGNESIUM, AMINO ACID CHELATE,) 133 MG tablet Take 1 tablet by mouth 2 (two) times daily.    [provider]  sucralfate (CARAFATE) 1 g tablet Take 1 g by mouth 4 (four) times daily -  with meals and at bedtime.    [provider]  VENTOLIN HFA 108 (90 Base) MCG/ACT inhaler INHALE 2 PUFFS EVERY 6 HOURS AS NEEDED Patient taking differently: Inhale 2 puffs into the lungs every 6 (six) hours as needed for shortness of breath.  12/16/16   Parrett, Fonnie Mu,  NP      Allergies    Actos [pioglitazone] and Aricept [donepezil hcl]    Review of Systems   Review of Systems  Constitutional:  Negative for fever.  HENT:  Negative for facial swelling.   Eyes:  Negative for redness.  Respiratory:  Negative for wheezing and stridor.   Cardiovascular:  Positive for chest pain.  Gastrointestinal:  Negative for vomiting.  Musculoskeletal:  Negative for back pain.  Neurological:  Negative for facial asymmetry, speech difficulty and weakness.  All other systems reviewed and are negative.   Physical Exam Updated Vital Signs BP (!) 161/74   Pulse 62   Temp 98 F (36.7 C) (Oral)   Resp 16   Ht _0  (1.727 m)   Wt 113.4 kg   SpO2 99%   BMI 38.01 kg/m  Physical Exam Vitals and nursing note reviewed.  Constitutional:      General: He is not in acute distress.    Appearance: He is well-developed. He is not diaphoretic.  HENT:     Head: Normocephalic and atraumatic.     Nose: Nose normal.  Eyes:     Conjunctiva/sclera: Conjunctivae normal.     Pupils: Pupils are equal, round, and reactive to light.  Cardiovascular:     Rate and Rhythm: Normal rate and regular rhythm.     Pulses: Normal pulses.     Heart sounds: Normal heart sounds.  Pulmonary:     Effort: Pulmonary effort is normal.     Breath sounds: Normal breath sounds. No wheezing or rales.  Abdominal:     General: Bowel sounds are normal.     Palpations: Abdomen is soft.     Tenderness: There is no abdominal tenderness. There is no guarding or  rebound.  Musculoskeletal:        General: Normal range of motion.     Cervical back: Normal range of motion and neck supple.  Skin:    General: Skin is warm and dry.     Capillary Refill: Capillary refill takes less than 2 seconds.  Neurological:     General: No focal deficit present.     Mental Status: He is alert.     Deep Tendon Reflexes: Reflexes normal.  Psychiatric:        Mood and Affect: Mood normal.        Behavior: Behavior  normal.     ED Results / Procedures / Treatments   Labs (all labs ordered are listed, but only abnormal results are displayed) Results for orders placed or performed during the hospital encounter of 54/56/25  Basic metabolic panel  Result Value Ref Range   Sodium 136 135 - 145 mmol/L   Potassium 3.8 3.5 - 5.1 mmol/L   Chloride 102 98 - 111 mmol/L   CO2 27 22 - 32 mmol/L   Glucose, Bld 370 (H) 70 - 99 mg/dL   BUN 17 8 - 23 mg/dL   Creatinine, Ser 0.82 0.61 - 1.24 mg/dL   Calcium 8.7 (L) 8.9 - 10.3 mg/dL   GFR, Estimated >60 >60 mL/min   Anion gap 7 5 - 15  CBC  Result Value Ref Range   WBC 5.3 4.0 - 10.5 K/uL   RBC 4.44 4.22 - 5.81 MIL/uL   Hemoglobin 13.7 13.0 - 17.0 g/dL   HCT 40.9 39.0 - 52.0 %   MCV 92.1 80.0 - 100.0 fL   MCH 30.9 26.0 - 34.0 pg   MCHC 33.5 30.0 - 36.0 g/dL   RDW 12.9 11.5 - 15.5 %   Platelets 149 (L) 150 - 400 K/uL   nRBC 0.0 0.0 - 0.2 %  Glucose, capillary  Result Value Ref Range   Glucose-Capillary 375 (H) 70 - 99 mg/dL  Troponin I (High Sensitivity)  Result Value Ref Range   Troponin I (High Sensitivity) 4 <18 ng/L   CT Angio Chest/Abd/Pel for Dissection W and/or Wo Contrast  Result Date: 02/09/2022 CLINICAL DATA:  Left-sided chest pain with shortness of breath EXAM: CT ANGIOGRAPHY CHEST, ABDOMEN AND PELVIS TECHNIQUE: Non-contrast CT of the chest was initially obtained. Multidetector CT imaging through the chest, abdomen and pelvis was performed using the standard protocol during bolus administration of intravenous contrast. Multiplanar reconstructed images and MIPs were obtained and reviewed to evaluate the vascular anatomy. RADIATION DOSE REDUCTION: This exam was performed according to the departmental dose-optimization program which includes automated exposure control, adjustment of the mA and/or kV according to patient size and/or use of iterative reconstruction technique. CONTRAST:  18m OMNIPAQUE IOHEXOL 350 MG/ML SOLN COMPARISON:  None  Available. FINDINGS: CTA CHEST FINDINGS Cardiovascular: Initial precontrast images demonstrate no hyperdense crescent to suggest acute aortic injury. Thoracic aorta and its branches are within normal limits. No aneurysmal dilatation or dissection is seen. No cardiac enlargement is noted. The pulmonary artery shows a normal branching pattern without filling defect to suggest pulmonary embolism. Mediastinum/Nodes: Esophagus is within normal limits. No hilar or mediastinal adenopathy is noted. The thoracic inlet is within normal limits. Lungs/Pleura: Lungs are well aerated bilaterally. No focal infiltrate or sizable effusion is seen. Musculoskeletal: Mild degenerative changes of the thoracic spine are seen. No rib abnormality is noted. Review of the MIP images confirms the above findings. CTA ABDOMEN AND PELVIS FINDINGS VASCULAR Aorta: Atherosclerotic  calcifications are noted. No aneurysmal dilatation or dissection is noted. Celiac: Patent without evidence of aneurysm, dissection, vasculitis or significant stenosis. SMA: Patent without evidence of aneurysm, dissection, vasculitis or significant stenosis. Renals: Both renal arteries are patent without evidence of aneurysm, dissection, vasculitis, fibromuscular dysplasia or significant stenosis. IMA: Patent without evidence of aneurysm, dissection, vasculitis or significant stenosis. Inflow: Iliacs demonstrate atherosclerotic calcifications without aneurysmal dilatation. Veins: No specific venous abnormality is noted. Review of the MIP images confirms the above findings. NON-VASCULAR Hepatobiliary: No focal liver abnormality is seen. Status post cholecystectomy. No biliary dilatation. Pancreas: Unremarkable. No pancreatic ductal dilatation or surrounding inflammatory changes. Spleen: Normal in size without focal abnormality. Adrenals/Urinary Tract: Adrenal glands are within normal limits. Kidneys demonstrate a normal enhancement pattern bilaterally. No renal calculi or  obstructive changes are seen. The bladder is partially distended. Stomach/Bowel: No obstructive or inflammatory changes of the colon are seen. The appendix is within normal limits. Small bowel and stomach are unremarkable. Lymphatic: No lymphadenopathy is noted. Reproductive: Prostate is unremarkable. Other: No abdominal wall hernia or abnormality. No abdominopelvic ascites. Musculoskeletal: No acute or significant osseous findings. Review of the MIP images confirms the above findings. IMPRESSION: No evidence of acute aortic abnormality. No pulmonary emboli are seen. No acute abnormality to correspond with the given clinical history is noted. Aortic Atherosclerosis (ICD10-I70.0). Electronically Signed   By: Inez Catalina M.D.   On: 02/09/2022 23:48   DG Chest 2 View  Result Date: 02/09/2022 CLINICAL DATA:  Bryan Becker left-sided chest pain that radiates down the left arm. EXAM: CHEST - 2 VIEW COMPARISON:  December 18, 2021 FINDINGS: The heart size and mediastinal contours are within normal limits. Both lungs are clear. The visualized skeletal structures are unremarkable. IMPRESSION: No active cardiopulmonary disease. Electronically Signed   By: Virgina Norfolk M.D.   On: 02/09/2022 22:31     EKG EKG Interpretation  Date/Time:  Wednesday February 09 2022 22:11:13 EDT Ventricular Rate:  73 PR Interval:  144 QRS Duration: 84 QT Interval:  386 QTC Calculation: 425 R Axis:   62 Text Interpretation: Normal sinus rhythm Confirmed by Randal Buba, Rodneisha Bonnet ([id number]) on 02/09/2022 11:06:54 PM  Radiology CT Angio Chest/Abd/Pel for Dissection W and/or Wo Contrast  Result Date: 02/09/2022 CLINICAL DATA:  Left-sided chest pain with shortness of breath EXAM: CT ANGIOGRAPHY CHEST, ABDOMEN AND PELVIS TECHNIQUE: Non-contrast CT of the chest was initially obtained. Multidetector CT imaging through the chest, abdomen and pelvis was performed using the standard protocol during bolus administration of intravenous contrast.  Multiplanar reconstructed images and MIPs were obtained and reviewed to evaluate the vascular anatomy. RADIATION DOSE REDUCTION: This exam was performed according to the departmental dose-optimization program which includes automated exposure control, adjustment of the mA and/or kV according to patient size and/or use of iterative reconstruction technique. CONTRAST:  132m OMNIPAQUE IOHEXOL 350 MG/ML SOLN COMPARISON:  None Available. FINDINGS: CTA CHEST FINDINGS Cardiovascular: Initial precontrast images demonstrate no hyperdense crescent to suggest acute aortic injury. Thoracic aorta and its branches are within normal limits. No aneurysmal dilatation or dissection is seen. No cardiac enlargement is noted. The pulmonary artery shows a normal branching pattern without filling defect to suggest pulmonary embolism. Mediastinum/Nodes: Esophagus is within normal limits. No hilar or mediastinal adenopathy is noted. The thoracic inlet is within normal limits. Lungs/Pleura: Lungs are well aerated bilaterally. No focal infiltrate or sizable effusion is seen. Musculoskeletal: Mild degenerative changes of the thoracic spine are seen. No rib abnormality is noted. Review of the MIP images confirms  the above findings. CTA ABDOMEN AND PELVIS FINDINGS VASCULAR Aorta: Atherosclerotic calcifications are noted. No aneurysmal dilatation or dissection is noted. Celiac: Patent without evidence of aneurysm, dissection, vasculitis or significant stenosis. SMA: Patent without evidence of aneurysm, dissection, vasculitis or significant stenosis. Renals: Both renal arteries are patent without evidence of aneurysm, dissection, vasculitis, fibromuscular dysplasia or significant stenosis. IMA: Patent without evidence of aneurysm, dissection, vasculitis or significant stenosis. Inflow: Iliacs demonstrate atherosclerotic calcifications without aneurysmal dilatation. Veins: No specific venous abnormality is noted. Review of the MIP images confirms  the above findings. NON-VASCULAR Hepatobiliary: No focal liver abnormality is seen. Status post cholecystectomy. No biliary dilatation. Pancreas: Unremarkable. No pancreatic ductal dilatation or surrounding inflammatory changes. Spleen: Normal in size without focal abnormality. Adrenals/Urinary Tract: Adrenal glands are within normal limits. Kidneys demonstrate a normal enhancement pattern bilaterally. No renal calculi or obstructive changes are seen. The bladder is partially distended. Stomach/Bowel: No obstructive or inflammatory changes of the colon are seen. The appendix is within normal limits. Small bowel and stomach are unremarkable. Lymphatic: No lymphadenopathy is noted. Reproductive: Prostate is unremarkable. Other: No abdominal wall hernia or abnormality. No abdominopelvic ascites. Musculoskeletal: No acute or significant osseous findings. Review of the MIP images confirms the above findings. IMPRESSION: No evidence of acute aortic abnormality. No pulmonary emboli are seen. No acute abnormality to correspond with the given clinical history is noted. Aortic Atherosclerosis (ICD10-I70.0). Electronically Signed   By: Inez Catalina M.D.   On: 02/09/2022 23:48   DG Chest 2 View  Result Date: 02/09/2022 CLINICAL DATA:  Bryan Becker left-sided chest pain that radiates down the left arm. EXAM: CHEST - 2 VIEW COMPARISON:  December 18, 2021 FINDINGS: The heart size and mediastinal contours are within normal limits. Both lungs are clear. The visualized skeletal structures are unremarkable. IMPRESSION: No active cardiopulmonary disease. Electronically Signed   By: Virgina Norfolk M.D.   On: 02/09/2022 22:31    Procedures Procedures    Medications Ordered in ED Medications  iohexol (OMNIPAQUE) 350 MG/ML injection 100 mL (has no administration in time range)  alum & mag hydroxide-simeth (MAALOX/MYLANTA) 200-200-20 MG/5ML suspension 30 mL (has no administration in time range)  ketorolac (TORADOL) 30 MG/ML injection  30 mg (has no administration in time range)  iohexol (OMNIPAQUE) 350 MG/ML injection 125 mL (125 mLs Intravenous Contrast Given 02/09/22 2322)    ED Course/ Medical Decision Making/ A&P                           Medical Decision Making Patient presents with L sided chest pain both pressure and sharp at rest.    Amount and/or Complexity of Data Reviewed External Data Reviewed: notes.    Details: Previous notes reviewed  Labs: ordered.    Details: All labs reviewed:  troponin is negative at 4 second troponin also 4 and negative.  Sodium normal 136, normal potassium.  Creatine normal .82.  Normal white count 5.3, normal hemoglobin 13.7.  Normal platelet count.   Radiology: ordered and independent interpretation performed.    Details: Negative CXR by me.  No PE on CTA by me  ECG/medicine tests: ordered and independent interpretation performed. Decision-making details documented in ED Course.  Risk OTC drugs. Prescription drug management. Risk Details: Patient presents with pain at rest while driving that started at 9 pm. Differential diagnosis was ACS, dissection, Pulmonary embolism, CHF, GERD or musculoskeletal pain. EDP spoke with the patient at length about what this could be and  the tests that had been ordered to exclude life threatening conditions.  Troponins are without change at 4 with symptoms.  Ruled out for MI in the ED with negative EKG and 2 negative troponins. No CHF on exam or imaging.  No pitting edema.  Heart score is 2 low risk for MACE. Ruled out for both PE and dissection.  Patient states "something happened, but I'm ready to go.  I don't think this is indigestion."  EDP politely stated I did not say that and that what I could tell him was that all life threatening conditions were excluded.  EDP explained that we had to wait for the second set of heart blood work to ensure that it was not his heart. Stable for discharge.  Follow up with both your PMD and cardiology.  Strict  return.      Final Clinical Impression(s) / ED Diagnoses Final diagnoses:  None  Return for intractable cough, coughing up blood, fevers > 100.4 unrelieved by medication, shortness of breath, intractable vomiting, chest pain, shortness of breath, weakness, numbness, changes in speech, facial asymmetry, abdominal pain, passing out, Inability to tolerate liquids or food, cough, altered mental status or any concerns. No signs of systemic illness or infection. The patient is nontoxic-appearing on exam and vital signs are within normal limits.  I have reviewed the triage vital signs and the nursing notes. Pertinent labs & imaging results that were available during my care of the patient were reviewed by me and considered in my medical decision making (see chart for details). After history, exam, and medical workup I feel the patient has been appropriately medically screened and is safe for discharge home. Pertinent diagnoses were discussed with the patient. Patient was given return precautions.      Aizlyn Schifano, MD 02/10/22 [phone]

## 2023-04-05 ENCOUNTER — Encounter: Payer: Self-pay | Admitting: Internal Medicine

## 2023-06-24 HISTORY — PX: PROSTATE SURGERY: SHX751

## 2023-07-19 ENCOUNTER — Encounter (INDEPENDENT_AMBULATORY_CARE_PROVIDER_SITE_OTHER): Payer: Self-pay

## 2023-07-19 ENCOUNTER — Telehealth (INDEPENDENT_AMBULATORY_CARE_PROVIDER_SITE_OTHER): Payer: Self-pay | Admitting: Otolaryngology

## 2023-07-19 NOTE — Telephone Encounter (Signed)
 Called patient per Dr. Leighton Roach request to schedule an [facility] consult.  LVM for patient to call our office to schedule.  Also, sent a MyChart message.

## 2023-08-07 ENCOUNTER — Encounter (INDEPENDENT_AMBULATORY_CARE_PROVIDER_SITE_OTHER): Payer: Self-pay | Admitting: Otolaryngology

## 2023-08-07 ENCOUNTER — Ambulatory Visit (INDEPENDENT_AMBULATORY_CARE_PROVIDER_SITE_OTHER): Payer: Medicare HMO | Admitting: Otolaryngology

## 2023-08-07 VITALS — BP 152/80 | HR 74 | Ht 69.0 in | Wt 255.0 lb

## 2023-08-07 DIAGNOSIS — Z6837 Body mass index (BMI) 37.0-37.9, adult: Secondary | ICD-10-CM | POA: Diagnosis not present

## 2023-08-07 DIAGNOSIS — G4733 Obstructive sleep apnea (adult) (pediatric): Secondary | ICD-10-CM

## 2023-08-07 DIAGNOSIS — Z91198 Patient's noncompliance with other medical treatment and regimen for other reason: Secondary | ICD-10-CM

## 2023-08-07 DIAGNOSIS — Z789 Other specified health status: Secondary | ICD-10-CM

## 2023-08-07 DIAGNOSIS — J342 Deviated nasal septum: Secondary | ICD-10-CM

## 2023-08-07 DIAGNOSIS — R0981 Nasal congestion: Secondary | ICD-10-CM | POA: Diagnosis not present

## 2023-08-07 DIAGNOSIS — J343 Hypertrophy of nasal turbinates: Secondary | ICD-10-CM

## 2023-08-07 DIAGNOSIS — J3089 Other allergic rhinitis: Secondary | ICD-10-CM

## 2023-08-07 NOTE — H&P (View-Only) (Signed)
 ENT CONSULT:  Reason for Consult: OSA, CPAP intolerance    HPI: Discussed the use of AI scribe software for clinical note transcription with the patient, who gave verbal consent to proceed.  History of Present Illness   Bryan Becker is a 63 year old male with hx of obstructive sleep apnea who presents to discuss alternatives to CPAP therapy. He was self-referred after learning about the [facility] implant.  He was diagnosed with obstructive sleep apnea in 2017 and used CPAP therapy since his diagnosis. He currently uses a BiPAP machine with a nasal pillow mask every night. He has significant nasal congestion, facial pressure from the mask which affects quality of sleep. Nasal sprays worsen his symptoms if overused, and Claritin makes him sleepy. He is interested in alternatives to CPAP due to discomfort and inconvenience, especially during travel.  He has a history of a stroke around 2017, for which he has been on Plavix since then. The stroke affected his left side, causing difficulties with walking and dressing, but he has improved significantly with rehabilitation. He does not currently follow with a neurologist.  He experiences chronic pain due to neuropathy and chronic back pain, for which he takes amitriptyline. He receives injections for his lower back pain and finds the pain is controlled to a functional level, allowing him to engage in activities like playing with his grandchildren and gardening, though he sometimes needs to rest due to weakness.  He has a history of suspected heart attack, but a cardiac catheterization revealed no blockages. He experiences occasional palpitations, which have been managed with medication adjustments. He follows up with a cardiologist regularly.  He has a history of COPD attributed to past smoking, which he quit 25 years ago. He does not currently see a pulmonologist for this condition. He denies hx of insomnia. He lost significant amount of weight since  OSA diagnosis and his current BMI is 37.6.    Records Reviewed:  Cardiology office visit DR Therisa Doyne 04/10/23 Impression /Plan:  1. Palpitations patient wore a monitor is fairly benign monitor. Did have 1 episode of VT which technically he was symptomatic from 6 beats. 1 episode of SVT otherwise unremarkable. We did discuss potentially titrating on his Bystolic we are unsure if he is taking 5 mg once a day or twice a day. He will call us back with the dose he is taking and we will double it from there. I have asked him to monitor his blood pressure and heart rate.  2. Atypical chest pain the patient does suffer from chronic pain he does not believe is related to his heart but he does describe symptoms of discomfort going into his jaw and down his arm. We did discuss potential stress testing. At this time elected for a more conservative approach and we will see how he responds to the medications. Overall he feels like he is doing fairly well.  3. Diabetes managed by his primary care doctor 4. Coronary artery disease had a CCTA done in 2021 with mild to moderate CAD.   History Present Illness:  Patient very pleasant 63 year old gentleman who is here for evaluation for monitor. He had a fluttering sensation in his chest and wore a Zio patch. He states the palpitations are daily. But they are brief in nature. Not terribly bothersome for him. He just seems to notice them. He does endorse having some discomfort in his chest periodically but are not associated with his palpitations. He states he has chronic pain  and believes much of the symptoms that he feels in his neck and chest and arm which are not exertional and random in nature likely noncardiac.      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"   Confusion    COPD (chronic obstructive pulmonary disease) (HCC)    Dementia (HCC)    onset of early   Dementia (HCC)    Depression    Fall at home 06/04/14   Pt stated he falls off the bed at nightly   Family history of adverse reaction to anesthesia    Patients mother had a bad reaction to Anesthesia however pt is unaware of reaction   Fibromyalgia    Frequent urination    Gastric ulcer    GERD (gastroesophageal reflux disease)    Headache(784.0)    "weekly" (12/04/2013)   Hyperlipidemia    Hypertension    Kidney stone    Myocardial infarction [facility])    unsure about MI history, but had normal cath that admission '07 ([facility])   Nausea & vomiting    OSA (obstructive sleep apnea) 04/24/2013   "use BiPAP"   Pancreatitis    Pneumonia    "several times"   [facility] spotted fever) 11/22/2012   Seasonal allergies    Shortness of breath dyspnea    with ambulation   Stroke [facility]) 03/2013   "still weak on the left side" (12/05/2013)   Stroke (HCC) 12/04/2013   "came in today w/right sided weakness" (12/04/2013)   Type II diabetes mellitus (HCC)     Past Surgical History:  Procedure Laterality Date   CARDIAC CATHETERIZATION     10/28/05: right dominant, normal LV systolic function, normal coronaries. (Dr. Constance Haw, [facility])   CHOLECYSTECTOMY     COLONOSCOPY     COLONOSCOPY WITH PROPOFOL N/A 07/08/2016   Procedure: COLONOSCOPY WITH PROPOFOL;  Surgeon: Iva Boop, MD;  Location: [facility] ENDOSCOPY;  Service: Endoscopy;  Laterality: N/A;   CYSTOSCOPY W/ STONE MANIPULATION     LUMBAR LAMINECTOMY/DECOMPRESSION MICRODISCECTOMY Left 06/18/2014   Procedure: LUMBAR LAMINECTOMY/DECOMPRESSION MICRODISCECTOMY LEFT LUMBAR FIVE-SACRAL ONE;  Surgeon: Coletta Memos, MD;  Location: [facility] NEURO ORS;  Service: Neurosurgery;  Laterality: Left;  left   multiple cysts removal-hip,wrist     TONSILLECTOMY  ~ 1985   UPPER  GASTROINTESTINAL ENDOSCOPY      Family History  Problem Relation Age of Onset   Skin cancer Father    Heart attack Father    Stomach cancer Father    Hypertension Father    Aneurysm Father    Lung cancer Paternal Grandfather    Heart disease Paternal Grandfather    Stomach cancer Maternal Grandfather    Bone cancer Paternal Grandmother    Heart attack Paternal Grandmother    Diabetes Mother    Hypertension Mother    Arthritis Mother    Other Mother        brain stem vertigo/disorder of pancreas   Diabetes Maternal Grandmother    Heart disease Maternal Grandmother    Aneurysm Maternal Uncle    Aneurysm Maternal Uncle    Colon cancer Neg Hx  Esophageal cancer Neg Hx    Rectal cancer Neg Hx     Social History:  reports that he quit smoking about 21 years ago. His smoking use included cigarettes. He started smoking about 42 years ago. He has a 42 pack-year smoking history. He has never used smokeless tobacco. He reports that he does not drink alcohol and does not use drugs.  Allergies:  Allergies  Allergen Reactions   Actos [Pioglitazone] Shortness Of Breath    Caused patient to be in CHF   Aricept [Donepezil Hcl] Diarrhea   Metformin Other (See Comments)    Medications: I have reviewed the patient's current medications.  The PMH, PSH, Medications, Allergies, and SH were reviewed and updated.  ROS: Constitutional: Negative for fever, weight loss and weight gain. Cardiovascular: Negative for chest pain and dyspnea on exertion. Respiratory: Is not experiencing shortness of breath at rest. Gastrointestinal: Negative for nausea and vomiting. Neurological: Negative for headaches. Psychiatric: The patient is not nervous/anxious  Blood pressure (!) 152/80, pulse 74, height 5\' 9"  (1.753 m), weight 255 lb (115.7 kg), SpO2 97%. Body mass index is 37.66 kg/m.  PHYSICAL EXAM:  Exam: General: Well-developed, well-nourished Respiratory Respiratory effort: Equal inspiration  and expiration without stridor Cardiovascular Peripheral Vascular: Warm extremities with equal color/perfusion Eyes: No nystagmus with equal extraocular motion bilaterally Neuro/Psych/Balance: Patient oriented to person, place, and time; Appropriate mood and affect; Gait is intact with no imbalance; Cranial nerves I-XII are intact Head and Face Inspection: Normocephalic and atraumatic without mass or lesion Palpation: Facial skeleton intact without bony stepoffs Salivary Glands: No mass or tenderness Facial Strength: Facial motility symmetric and full bilaterally ENT Pinna: External ear intact and fully developed External canal: Canal is patent with intact skin Tympanic Membrane: Clear and mobile External Nose: No scar or anatomic deformity Internal Nose: Septum is S-shaped. No polyp, or purulence. Mucosal edema and erythema present.  Bilateral inferior turbinate hypertrophy.  Lips, Teeth, and gums: Mucosa and teeth intact and viable TMJ: No pain to palpation with full mobility Oral cavity/oropharynx: No erythema or exudate, no lesions present no tonsils Friedman IV tongue position  Nasopharynx: No mass or lesion with intact mucosa Hypopharynx: Intact mucosa without pooling of secretions Larynx Glottic: Full true vocal cord mobility without lesion or mass Supraglottic: Normal appearing epiglottis and AE folds Interarytenoid Space: Moderate pachydermia&edema Subglottic Space: Patent without lesion or edema Neck Neck and Trachea: Midline trachea without mass or lesion Thyroid: No mass or nodularity Lymphatics: No lymphadenopathy  Procedure: Preoperative diagnosis: OSA CPAP intolerance  Postoperative diagnosis:   Same  Procedure: Flexible fiberoptic laryngoscopy  Surgeon: [name], MD  Anesthesia: Topical lidocaine and Afrin Complications: None Condition is stable throughout exam  Indications and consent:  The patient presents to the clinic with above  symptoms. Indirect laryngoscopy view was incomplete. Thus it was recommended that they undergo a flexible fiberoptic laryngoscopy. All of the risks, benefits, and potential complications were reviewed with the patient preoperatively and verbal informed consent was obtained.  Procedure: The patient was seated upright in the clinic. Topical lidocaine and Afrin were applied to the nasal cavity. After adequate anesthesia had occurred, I then proceeded to pass the flexible telescope into the nasal cavity. The nasal cavity was patent without rhinorrhea or polyp. The nasopharynx was also patent without mass or lesion. The base of tongue was visualized and was normal. There were no signs of pooling of secretions in the piriform sinuses. The true vocal folds were mobile bilaterally. There were no signs  of glottic or supraglottic mucosal lesion or mass. There was moderate interarytenoid pachydermia and post cricoid edema. The telescope was then slowly withdrawn and the patient tolerated the procedure throughout.    Studies Reviewed: 01/04/2016 in-lab split-night sleep study done at [facility]  BMI 43 (weight was 284 lb at the time)  AHI 26.5 no central or mixed apneas   Assessment/Plan: Encounter Diagnoses  Name Primary?   OSA (obstructive sleep apnea) Yes   CPAP (continuous positive airway pressure) dependence     Assessment and Plan    Obstructive Sleep Apnea CPAP intolerance  Diagnosed in 2017 with an AHI of 26. Currently using BiPAP with nasal pillows nightly but seeks alternatives due to discomfort/poor quality of sleep and inconvenience during travel. Tried various types of masks, with persistent discomfort and facial pressure/chronic nasal congestion. Significant weight loss since initial diagnosis with current BMI 37.6 (used to be 43). Interested in [employer] implant as an alternative. Explained prequalification process including repeat sleep study and drug-induced sleep endoscopy to rule out  complete concentric collapse of the soft palate. Discussed surgical procedure involving two incisions and placement of a device in the right upper chest. Explained that the device syncs with breathing to move the tongue forward, reducing airway obstruction.  Postoperative restrictions include avoiding strenuous activity for four weeks. Device activation occurs four weeks post-op with follow-up for device titration. Battery lasts approximately ten years, requiring replacement thereafter. Insurance preapproval needed, and BMI may affect eligibility depending on his insurance BMI cutoff  OSA, moderate to severe, without multilevel collapse, with failure to tolerate PAP therapy and/or more conservative measures. Presence of absent tonsils and larger tongue position (Friedman tongue position or modified Mallampati) suggests that hypopharyngeal/retrolingual collapse is contributing to the patient's OSA. Zachery Conch, M et al. Staging of obstructive sleep apnea/hypopnea syndrome: a guide to appropriate treatment. Laryngoscope, 2004 Mar, 114(3):454-9. PMID: [id number]) Options including positional therapy, weight loss, oral appliances, PAP and surgical correction discussed. Pt is not an deal candidate for oral appliance due to severity of OSA. Pt could be a candidate for Hypoglossal nerve stimulation (Inspire therapy) pending DISE and repeat sleep study.  - Order repeat sleep study/consult at [facility] - last test 2017 and had significant BMI change - Schedule drug-induced sleep endoscopy - Submit results for insurance preapproval for Inspire implant - Request cardiology clearance 2/2 hx of CAD/stroke on Plavix, palpitations   Chronic Nasal Congestion Reports nasal congestion, uses OTC medications for relief. Finds nasal sprays irritating with overuse. Claritin causes drowsiness when taken in the morning. - consider trial of Flonase BID and nasal saline spray  Stroke Experienced stroke around 2017, on Plavix.  Experienced left-sided weakness after a stroke, significantly improved with rehabilitation. Primary care physician manages medication.  Chronic Pain Experiences chronic pain due to neuropathy and chronic back pain. Receives injections for lower back pain and takes amitriptyline for pain management. Pain controlled to a functional level, allowing engagement in daily activities.  COPD COPD related to former smoking, quit 25 years ago. Does not currently see a pulmonologist for management.  Cardiac History Suspected heart attack, cardiac catheterization showed no blockages. Experiences occasional palpitations, managed by cardiologist in [facility]. Recent follow-up three months ago. - Request cardiology clearance for Inspire implant     F/u Return for DISE and schedule a visit with [facility] for repeat sleep study   [name], MD Otolaryngology [facility] Phone: [phone] Fax: [phone]    08/07/2023, 6:55 PM

## 2023-08-07 NOTE — Progress Notes (Signed)
 ENT CONSULT:  Reason for Consult: OSA, CPAP intolerance    HPI: Discussed the use of AI scribe software for clinical note transcription with the patient, who gave verbal consent to proceed.  History of Present Illness   Bryan Becker is a 63 year old male with hx of obstructive sleep apnea who presents to discuss alternatives to CPAP therapy. He was self-referred after learning about the [facility] implant.  He was diagnosed with obstructive sleep apnea in 2017 and used CPAP therapy since his diagnosis. He currently uses a BiPAP machine with a nasal pillow mask every night. He has significant nasal congestion, facial pressure from the mask which affects quality of sleep. Nasal sprays worsen his symptoms if overused, and Claritin makes him sleepy. He is interested in alternatives to CPAP due to discomfort and inconvenience, especially during travel.  He has a history of a stroke around 2017, for which he has been on Plavix since then. The stroke affected his left side, causing difficulties with walking and dressing, but he has improved significantly with rehabilitation. He does not currently follow with a neurologist.  He experiences chronic pain due to neuropathy and chronic back pain, for which he takes amitriptyline. He receives injections for his lower back pain and finds the pain is controlled to a functional level, allowing him to engage in activities like playing with his grandchildren and gardening, though he sometimes needs to rest due to weakness.  He has a history of suspected heart attack, but a cardiac catheterization revealed no blockages. He experiences occasional palpitations, which have been managed with medication adjustments. He follows up with a cardiologist regularly.  He has a history of COPD attributed to past smoking, which he quit 25 years ago. He does not currently see a pulmonologist for this condition. He denies hx of insomnia. He lost significant amount of weight since  OSA diagnosis and his current BMI is 37.6.    Records Reviewed:  Cardiology office visit DR Therisa Doyne 04/10/23 Impression /Plan:  1. Palpitations patient wore a monitor is fairly benign monitor. Did have 1 episode of VT which technically he was symptomatic from 6 beats. 1 episode of SVT otherwise unremarkable. We did discuss potentially titrating on his Bystolic we are unsure if he is taking 5 mg once a day or twice a day. He will call us back with the dose he is taking and we will double it from there. I have asked him to monitor his blood pressure and heart rate.  2. Atypical chest pain the patient does suffer from chronic pain he does not believe is related to his heart but he does describe symptoms of discomfort going into his jaw and down his arm. We did discuss potential stress testing. At this time elected for a more conservative approach and we will see how he responds to the medications. Overall he feels like he is doing fairly well.  3. Diabetes managed by his primary care doctor 4. Coronary artery disease had a CCTA done in 2021 with mild to moderate CAD.   History Present Illness:  Patient very pleasant 63 year old gentleman who is here for evaluation for monitor. He had a fluttering sensation in his chest and wore a Zio patch. He states the palpitations are daily. But they are brief in nature. Not terribly bothersome for him. He just seems to notice them. He does endorse having some discomfort in his chest periodically but are not associated with his palpitations. He states he has chronic pain  and believes much of the symptoms that he feels in his neck and chest and arm which are not exertional and random in nature likely noncardiac.      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"   Confusion    COPD (chronic obstructive pulmonary disease) (HCC)    Dementia (HCC)    onset of early   Dementia (HCC)    Depression    Fall at home 06/04/14   Pt stated he falls off the bed at nightly   Family history of adverse reaction to anesthesia    Patients mother had a bad reaction to Anesthesia however pt is unaware of reaction   Fibromyalgia    Frequent urination    Gastric ulcer    GERD (gastroesophageal reflux disease)    Headache(784.0)    "weekly" (12/04/2013)   Hyperlipidemia    Hypertension    Kidney stone    Myocardial infarction [facility])    unsure about MI history, but had normal cath that admission '07 ([facility])   Nausea & vomiting    OSA (obstructive sleep apnea) 04/24/2013   "use BiPAP"   Pancreatitis    Pneumonia    "several times"   [facility] spotted fever) 11/22/2012   Seasonal allergies    Shortness of breath dyspnea    with ambulation   Stroke [facility]) 03/2013   "still weak on the left side" (12/05/2013)   Stroke (HCC) 12/04/2013   "came in today w/right sided weakness" (12/04/2013)   Type II diabetes mellitus (HCC)     Past Surgical History:  Procedure Laterality Date   CARDIAC CATHETERIZATION     10/28/05: right dominant, normal LV systolic function, normal coronaries. (Dr. Constance Haw, [facility])   CHOLECYSTECTOMY     COLONOSCOPY     COLONOSCOPY WITH PROPOFOL N/A 07/08/2016   Procedure: COLONOSCOPY WITH PROPOFOL;  Surgeon: Iva Boop, MD;  Location: [facility] ENDOSCOPY;  Service: Endoscopy;  Laterality: N/A;   CYSTOSCOPY W/ STONE MANIPULATION     LUMBAR LAMINECTOMY/DECOMPRESSION MICRODISCECTOMY Left 06/18/2014   Procedure: LUMBAR LAMINECTOMY/DECOMPRESSION MICRODISCECTOMY LEFT LUMBAR FIVE-SACRAL ONE;  Surgeon: Coletta Memos, MD;  Location: [facility] NEURO ORS;  Service: Neurosurgery;  Laterality: Left;  left   multiple cysts removal-hip,wrist     TONSILLECTOMY  ~ 1985   UPPER  GASTROINTESTINAL ENDOSCOPY      Family History  Problem Relation Age of Onset   Skin cancer Father    Heart attack Father    Stomach cancer Father    Hypertension Father    Aneurysm Father    Lung cancer Paternal Grandfather    Heart disease Paternal Grandfather    Stomach cancer Maternal Grandfather    Bone cancer Paternal Grandmother    Heart attack Paternal Grandmother    Diabetes Mother    Hypertension Mother    Arthritis Mother    Other Mother        brain stem vertigo/disorder of pancreas   Diabetes Maternal Grandmother    Heart disease Maternal Grandmother    Aneurysm Maternal Uncle    Aneurysm Maternal Uncle    Colon cancer Neg Hx  Esophageal cancer Neg Hx    Rectal cancer Neg Hx     Social History:  reports that he quit smoking about 21 years ago. His smoking use included cigarettes. He started smoking about 42 years ago. He has a 42 pack-year smoking history. He has never used smokeless tobacco. He reports that he does not drink alcohol and does not use drugs.  Allergies:  Allergies  Allergen Reactions   Actos [Pioglitazone] Shortness Of Breath    Caused patient to be in CHF   Aricept [Donepezil Hcl] Diarrhea   Metformin Other (See Comments)    Medications: I have reviewed the patient's current medications.  The PMH, PSH, Medications, Allergies, and SH were reviewed and updated.  ROS: Constitutional: Negative for fever, weight loss and weight gain. Cardiovascular: Negative for chest pain and dyspnea on exertion. Respiratory: Is not experiencing shortness of breath at rest. Gastrointestinal: Negative for nausea and vomiting. Neurological: Negative for headaches. Psychiatric: The patient is not nervous/anxious  Blood pressure (!) 152/80, pulse 74, height 5\' 9"  (1.753 m), weight 255 lb (115.7 kg), SpO2 97%. Body mass index is 37.66 kg/m.  PHYSICAL EXAM:  Exam: General: Well-developed, well-nourished Respiratory Respiratory effort: Equal inspiration  and expiration without stridor Cardiovascular Peripheral Vascular: Warm extremities with equal color/perfusion Eyes: No nystagmus with equal extraocular motion bilaterally Neuro/Psych/Balance: Patient oriented to person, place, and time; Appropriate mood and affect; Gait is intact with no imbalance; Cranial nerves I-XII are intact Head and Face Inspection: Normocephalic and atraumatic without mass or lesion Palpation: Facial skeleton intact without bony stepoffs Salivary Glands: No mass or tenderness Facial Strength: Facial motility symmetric and full bilaterally ENT Pinna: External ear intact and fully developed External canal: Canal is patent with intact skin Tympanic Membrane: Clear and mobile External Nose: No scar or anatomic deformity Internal Nose: Septum is S-shaped. No polyp, or purulence. Mucosal edema and erythema present.  Bilateral inferior turbinate hypertrophy.  Lips, Teeth, and gums: Mucosa and teeth intact and viable TMJ: No pain to palpation with full mobility Oral cavity/oropharynx: No erythema or exudate, no lesions present no tonsils Friedman IV tongue position  Nasopharynx: No mass or lesion with intact mucosa Hypopharynx: Intact mucosa without pooling of secretions Larynx Glottic: Full true vocal cord mobility without lesion or mass Supraglottic: Normal appearing epiglottis and AE folds Interarytenoid Space: Moderate pachydermia&edema Subglottic Space: Patent without lesion or edema Neck Neck and Trachea: Midline trachea without mass or lesion Thyroid: No mass or nodularity Lymphatics: No lymphadenopathy  Procedure: Preoperative diagnosis: OSA CPAP intolerance  Postoperative diagnosis:   Same  Procedure: Flexible fiberoptic laryngoscopy  Surgeon: [name], MD  Anesthesia: Topical lidocaine and Afrin Complications: None Condition is stable throughout exam  Indications and consent:  The patient presents to the clinic with above  symptoms. Indirect laryngoscopy view was incomplete. Thus it was recommended that they undergo a flexible fiberoptic laryngoscopy. All of the risks, benefits, and potential complications were reviewed with the patient preoperatively and verbal informed consent was obtained.  Procedure: The patient was seated upright in the clinic. Topical lidocaine and Afrin were applied to the nasal cavity. After adequate anesthesia had occurred, I then proceeded to pass the flexible telescope into the nasal cavity. The nasal cavity was patent without rhinorrhea or polyp. The nasopharynx was also patent without mass or lesion. The base of tongue was visualized and was normal. There were no signs of pooling of secretions in the piriform sinuses. The true vocal folds were mobile bilaterally. There were no signs  of glottic or supraglottic mucosal lesion or mass. There was moderate interarytenoid pachydermia and post cricoid edema. The telescope was then slowly withdrawn and the patient tolerated the procedure throughout.    Studies Reviewed: 01/04/2016 in-lab split-night sleep study done at [facility]  BMI 43 (weight was 284 lb at the time)  AHI 26.5 no central or mixed apneas   Assessment/Plan: Encounter Diagnoses  Name Primary?   OSA (obstructive sleep apnea) Yes   CPAP (continuous positive airway pressure) dependence     Assessment and Plan    Obstructive Sleep Apnea CPAP intolerance  Diagnosed in 2017 with an AHI of 26. Currently using BiPAP with nasal pillows nightly but seeks alternatives due to discomfort/poor quality of sleep and inconvenience during travel. Tried various types of masks, with persistent discomfort and facial pressure/chronic nasal congestion. Significant weight loss since initial diagnosis with current BMI 37.6 (used to be 43). Interested in [employer] implant as an alternative. Explained prequalification process including repeat sleep study and drug-induced sleep endoscopy to rule out  complete concentric collapse of the soft palate. Discussed surgical procedure involving two incisions and placement of a device in the right upper chest. Explained that the device syncs with breathing to move the tongue forward, reducing airway obstruction.  Postoperative restrictions include avoiding strenuous activity for four weeks. Device activation occurs four weeks post-op with follow-up for device titration. Battery lasts approximately ten years, requiring replacement thereafter. Insurance preapproval needed, and BMI may affect eligibility depending on his insurance BMI cutoff  OSA, moderate to severe, without multilevel collapse, with failure to tolerate PAP therapy and/or more conservative measures. Presence of absent tonsils and larger tongue position (Friedman tongue position or modified Mallampati) suggests that hypopharyngeal/retrolingual collapse is contributing to the patient's OSA. Zachery Conch, M et al. Staging of obstructive sleep apnea/hypopnea syndrome: a guide to appropriate treatment. Laryngoscope, 2004 Mar, 114(3):454-9. PMID: [id number]) Options including positional therapy, weight loss, oral appliances, PAP and surgical correction discussed. Pt is not an deal candidate for oral appliance due to severity of OSA. Pt could be a candidate for Hypoglossal nerve stimulation (Inspire therapy) pending DISE and repeat sleep study.  - Order repeat sleep study/consult at [facility] - last test 2017 and had significant BMI change - Schedule drug-induced sleep endoscopy - Submit results for insurance preapproval for Inspire implant - Request cardiology clearance 2/2 hx of CAD/stroke on Plavix, palpitations   Chronic Nasal Congestion Reports nasal congestion, uses OTC medications for relief. Finds nasal sprays irritating with overuse. Claritin causes drowsiness when taken in the morning. - consider trial of Flonase BID and nasal saline spray  Stroke Experienced stroke around 2017, on Plavix.  Experienced left-sided weakness after a stroke, significantly improved with rehabilitation. Primary care physician manages medication.  Chronic Pain Experiences chronic pain due to neuropathy and chronic back pain. Receives injections for lower back pain and takes amitriptyline for pain management. Pain controlled to a functional level, allowing engagement in daily activities.  COPD COPD related to former smoking, quit 25 years ago. Does not currently see a pulmonologist for management.  Cardiac History Suspected heart attack, cardiac catheterization showed no blockages. Experiences occasional palpitations, managed by cardiologist in [facility]. Recent follow-up three months ago. - Request cardiology clearance for Inspire implant     F/u Return for DISE and schedule a visit with [facility] for repeat sleep study   [name], MD Otolaryngology [facility] Phone: [phone] Fax: [phone]    08/07/2023, 6:55 PM

## 2023-08-21 ENCOUNTER — Telehealth (INDEPENDENT_AMBULATORY_CARE_PROVIDER_SITE_OTHER): Payer: Self-pay | Admitting: Otolaryngology

## 2023-08-21 NOTE — Telephone Encounter (Signed)
 Left voicemail about an opening for surgery tomorrow morning - per Dr. Irene Pap. Move up if able.

## 2023-08-22 NOTE — Anesthesia Preprocedure Evaluation (Addendum)
 Anesthesia Evaluation  Patient identified by MRN, date of birth, ID band Patient awake    Reviewed: Allergy & Precautions, NPO status , Patient's Chart, lab work & pertinent test results  Airway Mallampati: III  TM Distance: >3 FB Neck ROM: Full    Dental no notable dental hx. (+) Edentulous Upper, Edentulous Lower   Pulmonary asthma , sleep apnea (on BiPAP) , COPD, former smoker   Pulmonary exam normal breath sounds clear to auscultation       Cardiovascular hypertension, (-) angina + CAD and + Past MI (2012)  Normal cardiovascular exam+ dysrhythmias Supra Ventricular Tachycardia  Rhythm:Regular Rate:Normal    Echo 06/2019 showed EF 65 to 70%, normal RV function, no significant valvular abnormalities   Neuro/Psych   Anxiety Depression   Dementia CVA (2017 0n plavix)    GI/Hepatic ,GERD  Medicated and Controlled,,NASH   Endo/Other  diabetes, Type 2  Class 3 obesityOzempic 08/20/2023  Renal/GU      Musculoskeletal  (+) Arthritis ,  Fibromyalgia -  Abdominal   Peds  Hematology   Anesthesia Other Findings All: actos, meformin, aricept  Reproductive/Obstetrics                             Anesthesia Physical Anesthesia Plan  ASA: 3  Anesthesia Plan: MAC   Post-op Pain Management: Tylenol PO (pre-op)*   Induction: Intravenous  PONV Risk Score and Plan: 2 and Treatment may vary due to age or medical condition and Propofol infusion  Airway Management Planned: Natural Airway and Nasal Cannula  Additional Equipment: None  Intra-op Plan:   Post-operative Plan:   Informed Consent: I have reviewed the patients History and Physical, chart, labs and discussed the procedure including the risks, benefits and alternatives for the proposed anesthesia with the patient or authorized representative who has indicated his/her understanding and acceptance.     Dental advisory given  Plan Discussed  with: CRNA and Surgeon  Anesthesia Plan Comments:         Anesthesia Quick Evaluation

## 2023-08-22 NOTE — Progress Notes (Signed)
 Left a message to pt's voicemail to arrive at 0630 tom.

## 2023-08-22 NOTE — Progress Notes (Signed)
 Anesthesia Chart Review: Same-day workup  63 year old male follows with cardiology at [facility] for history of minimal nonobstructive CAD by CCTA 2021, atypical chest pain, palpitations.  Echo 06/2019 showed EF 65 to 70%, normal RV function, no significant valvular abnormalities.  He wore an event monitor in August 2024 that did have one 6 beat episode of VT which he was symptomatic from, and 1 episode of SVT.  Otherwise unremarkable.  He was last seen by Dr. Sharol Roussel on 04/10/2023, stable at that time, no changes to management.  Other pertinent history includes smoker (quit 2004), GERD on PPI, NASH, IDDM2, A1c 8.0 on 05/02/2023), OSA on BiPAP, COPD/asthma (on an oral Ellipta and as needed albuterol), CVA 2017 (maintained on Plavix), chronic pain syndrome, fibromyalgia, early onset dementia.  On once daily GLP-1 agonist liraglutide.  Patient will need day of surgery labs and evaluation.  Will need DOS labs and eval.   EKG 04/10/2023 (Care Everywhere): Sinus rhythm.  Rate 74.  T wave abnormality, consider inferior ischemia.  Inferior T wave abnormalities present since at least 2021.  Coronary CTA 07/03/2019: IMPRESSION: 1. Coronary calcium score of 581. This was 30 percentile for age and sex matched control.   2. Normal coronary origin with right dominance.   3. Nonobstructive CAD. Calcified plaque throughout LAD and RCA causing minimal (0-24%) stenosis   4. 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 Left ventricular ejection fraction, by estimation, is 65 to 70%. The  left ventricle has normal function. The left ventrical has no regional  wall motion abnormalities. Left ventricular diastolic parameters are  indeterminate. Elevated left ventricular  end-diastolic pressure.   2. Right ventricular systolic function is normal. The right ventricular  size is mildly enlarged. Tricuspid regurgitation signal is inadequate for  assessing PA pressure.   3. The mitral  valve is normal in structure and function. no evidence of  mitral valve regurgitation. No evidence of mitral stenosis.   4. The aortic valve is normal in structure and function. Aortic valve  regurgitation is not visualized. No aortic stenosis is present.   5. The inferior vena cava is dilated in size with >50% respiratory  variability, suggesting right atrial pressure of 8 mmHg.     Zannie [place]ove [facility] [place] [facility] Stay [facility]/Anesthesiology Phone [phone] 08/22/2023 11:17 AM

## 2023-08-22 NOTE — Progress Notes (Addendum)
 Multiple attempts to reach patient and wife with no success.  Left detailed voicemail with instructions for procedure.     Notified Dr. Irene Pap of the situation.

## 2023-08-23 ENCOUNTER — Other Ambulatory Visit: Payer: Self-pay

## 2023-08-23 ENCOUNTER — Encounter (HOSPITAL_COMMUNITY): Payer: Self-pay | Admitting: *Deleted

## 2023-08-23 ENCOUNTER — Encounter (HOSPITAL_COMMUNITY)
Admission: RE | Disposition: A | Discharge status: Home / Self Care | Payer: Self-pay | Source: Home / Self Care | Attending: Otolaryngology

## 2023-08-23 ENCOUNTER — Telehealth (INDEPENDENT_AMBULATORY_CARE_PROVIDER_SITE_OTHER): Payer: Self-pay | Admitting: Otolaryngology

## 2023-08-23 ENCOUNTER — Ambulatory Visit (HOSPITAL_COMMUNITY)
Admission: RE | Admit: 2023-08-23 | Discharge: 2023-08-23 | Disposition: A | Discharge status: Home / Self Care | Attending: Otolaryngology | Admitting: Otolaryngology

## 2023-08-23 ENCOUNTER — Ambulatory Visit (HOSPITAL_COMMUNITY): Payer: Self-pay | Admitting: Physician Assistant

## 2023-08-23 DIAGNOSIS — G4733 Obstructive sleep apnea (adult) (pediatric): Secondary | ICD-10-CM | POA: Insufficient documentation

## 2023-08-23 DIAGNOSIS — Q245 Malformation of coronary vessels: Secondary | ICD-10-CM | POA: Insufficient documentation

## 2023-08-23 DIAGNOSIS — Z7902 Long term (current) use of antithrombotics/antiplatelets: Secondary | ICD-10-CM | POA: Insufficient documentation

## 2023-08-23 DIAGNOSIS — F32A Depression, unspecified: Secondary | ICD-10-CM | POA: Insufficient documentation

## 2023-08-23 DIAGNOSIS — F419 Anxiety disorder, unspecified: Secondary | ICD-10-CM | POA: Diagnosis not present

## 2023-08-23 DIAGNOSIS — Z6841 Body Mass Index (BMI) 40.0 and over, adult: Secondary | ICD-10-CM | POA: Insufficient documentation

## 2023-08-23 DIAGNOSIS — Z79899 Other long term (current) drug therapy: Secondary | ICD-10-CM | POA: Diagnosis not present

## 2023-08-23 DIAGNOSIS — I509 Heart failure, unspecified: Secondary | ICD-10-CM | POA: Diagnosis not present

## 2023-08-23 DIAGNOSIS — I471 Supraventricular tachycardia, unspecified: Secondary | ICD-10-CM | POA: Diagnosis not present

## 2023-08-23 DIAGNOSIS — Z833 Family history of diabetes mellitus: Secondary | ICD-10-CM | POA: Insufficient documentation

## 2023-08-23 DIAGNOSIS — F0393 Unspecified dementia, unspecified severity, with mood disturbance: Secondary | ICD-10-CM | POA: Diagnosis not present

## 2023-08-23 DIAGNOSIS — J449 Chronic obstructive pulmonary disease, unspecified: Secondary | ICD-10-CM | POA: Insufficient documentation

## 2023-08-23 DIAGNOSIS — Z8673 Personal history of transient ischemic attack (TIA), and cerebral infarction without residual deficits: Secondary | ICD-10-CM | POA: Diagnosis not present

## 2023-08-23 DIAGNOSIS — I252 Old myocardial infarction: Secondary | ICD-10-CM | POA: Insufficient documentation

## 2023-08-23 DIAGNOSIS — I2510 Atherosclerotic heart disease of native coronary artery without angina pectoris: Secondary | ICD-10-CM | POA: Diagnosis not present

## 2023-08-23 DIAGNOSIS — E669 Obesity, unspecified: Secondary | ICD-10-CM | POA: Insufficient documentation

## 2023-08-23 DIAGNOSIS — I110 Hypertensive heart disease with heart failure: Secondary | ICD-10-CM | POA: Diagnosis not present

## 2023-08-23 DIAGNOSIS — Z87891 Personal history of nicotine dependence: Secondary | ICD-10-CM | POA: Diagnosis not present

## 2023-08-23 DIAGNOSIS — E1140 Type 2 diabetes mellitus with diabetic neuropathy, unspecified: Secondary | ICD-10-CM | POA: Insufficient documentation

## 2023-08-23 DIAGNOSIS — Z789 Other specified health status: Secondary | ICD-10-CM

## 2023-08-23 DIAGNOSIS — Z6837 Body mass index (BMI) 37.0-37.9, adult: Secondary | ICD-10-CM

## 2023-08-23 HISTORY — PX: DRUG INDUCED ENDOSCOPY: SHX6808

## 2023-08-23 LAB — CBC
HCT: 41.1 % (ref 39.0–52.0)
Hemoglobin: 14.1 g/dL (ref 13.0–17.0)
MCH: 30.2 pg (ref 26.0–34.0)
MCHC: 34.3 g/dL (ref 30.0–36.0)
MCV: 88 fL (ref 80.0–100.0)
Platelets: 156 10*3/uL (ref 150–400)
RBC: 4.67 MIL/uL (ref 4.22–5.81)
RDW: 13.3 % (ref 11.5–15.5)
WBC: 4.5 10*3/uL (ref 4.0–10.5)
nRBC: 0 % (ref 0.0–0.2)

## 2023-08-23 LAB — GLUCOSE, CAPILLARY
Glucose-Capillary: 98 mg/dL (ref 70–99)
Glucose-Capillary: 87 mg/dL (ref 70–99)
Glucose-Capillary: 89 mg/dL (ref 70–99)
Glucose-Capillary: 87 mg/dL (ref 70–99)

## 2023-08-23 LAB — BASIC METABOLIC PANEL WITH GFR
Anion gap: 5 (ref 5–15)
BUN: 8 mg/dL (ref 8–23)
CO2: 28 mmol/L (ref 22–32)
Calcium: 8.3 mg/dL — ABNORMAL LOW (ref 8.9–10.3)
Chloride: 107 mmol/L (ref 98–111)
Creatinine, Ser: 0.61 mg/dL (ref 0.61–1.24)
GFR, Estimated: >60 mL/min (ref >60)
Glucose, Bld: 98 mg/dL (ref 70–99)
Potassium: 2.9 mmol/L — ABNORMAL LOW (ref 3.5–5.1)
Sodium: 140 mmol/L (ref 135–145)

## 2023-08-23 LAB — SURGICAL PCR SCREEN
MRSA, PCR: NEGATIVE
Staphylococcus aureus: POSITIVE — AB

## 2023-08-23 SURGERY — DRUG INDUCED SLEEP ENDOSCOPY
Anesthesia: Monitor Anesthesia Care

## 2023-08-23 MED ORDER — ORAL CARE MOUTH RINSE: 15.0000 mL | Freq: Once | OROMUCOSAL | Status: AC

## 2023-08-23 MED ORDER — ONDANSETRON HCL 4 MG/2ML IJ SOLN: 4.0000 mg | Freq: Once | INTRAMUSCULAR | Status: DC | PRN

## 2023-08-23 MED ORDER — ONDANSETRON HCL 4 MG/2ML IJ SOLN
INTRAMUSCULAR | Status: AC
  Filled 2023-08-23: qty 2

## 2023-08-23 MED ORDER — OXYMETAZOLINE HCL 0.05 % NA SOLN
NASAL | Status: AC
  Filled 2023-08-23: qty 30

## 2023-08-23 MED ORDER — ACETAMINOPHEN 10 MG/ML IV SOLN: 1000.0000 mg | Freq: Once | INTRAVENOUS | Status: DC | PRN

## 2023-08-23 MED ORDER — PROPOFOL 500 MG/50ML IV EMUL
INTRAVENOUS | Status: DC | PRN
  Administered 2023-08-23: 100 ug/kg/min via INTRAVENOUS

## 2023-08-23 MED ORDER — DEXAMETHASONE SODIUM PHOSPHATE 10 MG/ML IJ SOLN
INTRAMUSCULAR | Status: AC
  Filled 2023-08-23: qty 1

## 2023-08-23 MED ORDER — PHENYLEPHRINE HCL-NACL 20-0.9 MG/250ML-% IV SOLN
INTRAVENOUS | Status: AC
  Filled 2023-08-23: qty 500

## 2023-08-23 MED ORDER — LIDOCAINE 2% (20 MG/ML) 5 ML SYRINGE
INTRAMUSCULAR | Status: AC
  Filled 2023-08-23: qty 5

## 2023-08-23 MED ORDER — SODIUM CHLORIDE 0.9 % IV SOLN: INTRAVENOUS | Status: DC | PRN

## 2023-08-23 MED ORDER — BISOPROLOL FUMARATE 5 MG PO TABS
5.0000 mg | ORAL_TABLET | Freq: Once | ORAL | Status: AC
  Administered 2023-08-23: 5 mg via ORAL
  Filled 2023-08-23: qty 1

## 2023-08-23 MED ORDER — LIDOCAINE-EPINEPHRINE 1 %-1:100000 IJ SOLN
INTRAMUSCULAR | Status: DC | PRN
  Administered 2023-08-23: 5 mL

## 2023-08-23 MED ORDER — PROPOFOL 10 MG/ML IV BOLUS
INTRAVENOUS | Status: AC
  Filled 2023-08-23: qty 20

## 2023-08-23 MED ORDER — CHLORHEXIDINE GLUCONATE 0.12 % MT SOLN
15.0000 mL | Freq: Once | OROMUCOSAL | Status: AC
  Administered 2023-08-23: 15 mL via OROMUCOSAL
  Filled 2023-08-23: qty 15

## 2023-08-23 MED ORDER — LACTATED RINGERS IV SOLN: INTRAVENOUS | Status: DC

## 2023-08-23 MED ORDER — PROPOFOL 1000 MG/100ML IV EMUL
INTRAVENOUS | Status: AC
  Filled 2023-08-23: qty 100

## 2023-08-23 MED ORDER — FENTANYL CITRATE (PF) 100 MCG/2ML IJ SOLN: 25.0000 ug | INTRAMUSCULAR | Status: DC | PRN

## 2023-08-23 MED ORDER — MIDAZOLAM HCL 2 MG/2ML IJ SOLN
INTRAMUSCULAR | Status: DC | PRN
  Administered 2023-08-23: 2 mg via INTRAVENOUS

## 2023-08-23 MED ORDER — OXYMETAZOLINE HCL 0.05 % NA SOLN
NASAL | Status: DC | PRN
  Administered 2023-08-23: 1

## 2023-08-23 MED ORDER — LIDOCAINE-EPINEPHRINE 1 %-1:100000 IJ SOLN
INTRAMUSCULAR | Status: AC
  Filled 2023-08-23: qty 1

## 2023-08-23 MED ORDER — MIDAZOLAM HCL 2 MG/2ML IJ SOLN
INTRAMUSCULAR | Status: AC
Start: 2023-08-23 — End: ?
  Filled 2023-08-23: qty 2

## 2023-08-23 MED ORDER — INSULIN ASPART 100 UNIT/ML IJ SOLN: 0.0000 [IU] | INTRAMUSCULAR | Status: DC | PRN

## 2023-08-23 MED ORDER — ACETAMINOPHEN 10 MG/ML IV SOLN
INTRAVENOUS | Status: AC
  Filled 2023-08-23: qty 100

## 2023-08-23 SURGICAL SUPPLY — 1 items: SOL ANTI FOG 6CC (MISCELLANEOUS) ×1 IMPLANT

## 2023-08-23 NOTE — Op Note (Signed)
 Operative note  Preoperative diagnosis: OSA Postoperative diagnosis:   Same  Procedure:DISE ( Drug induced sleep endoscopy)  Anesthesia: Topical lidocaine gel Complications: None Condition is stable throughout exam  Indications and consent:   The patient presents to my otolaryngology clinic with a chief complaint of OSA  Because of pt's OSA and desire to be a candidate for Inspire therapy, it was recommended that they undergo a flexible fiberoptic laryngoscopy under sedation (DISE).   All the risks, benefits, and potential complications were reviewed with the patient preoperatively and informed consent was obtained.  Procedure: Pt was brought back to the OR and laid supine on the table. Propofol anesthesia was administered per protocol until pt reached optimal level of sedation. DISE exam showed the following anatomic collapse pattern.  VOTE classification Velopharynx- 2 A-P  Oropharynx- 1 Tongue base- 2 Epiglottis- 2  There was no evidence of complete concentric collapse at the soft palate.  Based on the DISE findings, pt is a candidate for Hypoglossal nerve stimulation (Inspire therapy) based on the above anatomy.

## 2023-08-23 NOTE — Progress Notes (Signed)
 Dr. Richardson Landry made aware of patient's potassium level.

## 2023-08-23 NOTE — Interval H&P Note (Signed)
 History and Physical Interval Note:  08/23/2023 8:11 AM  Bryan Becker  has presented today for surgery, with the diagnosis of obstructive sleep apnea.  The various methods of treatment have been discussed with the patient and family. After consideration of risks, benefits and other options for treatment, the patient has consented to  Procedure(s): DRUG INDUCED SLEEP ENDOSCOPY (N/A) as a surgical intervention.  The patient's history has been reviewed, patient examined, no change in status, stable for surgery.  I have reviewed the patient's chart and labs.  Questions were answered to the patient's satisfaction.     [name]

## 2023-08-23 NOTE — Telephone Encounter (Signed)
 Left voicemail to schedule a follow up in 2-3 months with Dr. Irene Pap for a weight recheck for inspire implant - per dr. Irene Pap.

## 2023-08-23 NOTE — Transfer of Care (Signed)
 Immediate Anesthesia Transfer of Care Note  Patient: Bryan Becker  Procedure(s) Performed: DRUG INDUCED SLEEP ENDOSCOPY  Patient Location: PACU  Anesthesia Type:MAC  Level of Consciousness: drowsy  Airway & Oxygen Therapy: Patient Spontanous Breathing and Patient connected to nasal cannula oxygen  Post-op Assessment: Report given to RN and Post -op Vital signs reviewed and stable  Post vital signs: Reviewed and stable  Last Vitals:  Vitals Value Taken Time  BP 149/72 08/23/23 1100  Temp    Pulse 79 08/23/23 1104  Resp 19 08/23/23 1104  SpO2 94 % 08/23/23 1104  Vitals shown include unfiled device data.  Last Pain:  Vitals:   08/23/23 0739  TempSrc:   PainSc: 0-No pain         Complications: There were no known notable events for this encounter.

## 2023-08-23 NOTE — Discharge Instructions (Signed)
DRUG-INDUCED SLEEP ENDOSCOPY POST-OPERATIVE INSTRUCTIONS:  Based on the drug-induced sleep endoscopy today, you were deemed a candidate for Inspire Therapy.  Please review post-operative and recovery instructions below that you will need to be aware of after Inspire Implant surgery.  Please restart all of your home medications if you take anything on a daily basis.  You can resume regular diet after this procedure.  You will be scheduled for pre-operative appointment with Dr. Irene Pap to review details about surgery and to discuss the next steps.   DIET: Resume normal diet HYGIENE: Please wait until 48 hours after surgery before getting incisions on neck, chest, and torso wet. In the first 48 hours after surgery, will likely need to take sponge baths. WOUND CARE: Please leave pressure dressing on for 48 hours after surgery. Gently place antibiotic ointment over incisions 2 times per day; use clean q-tip. May place a clean bandage over incisions as needed. After 48 hours, you may get incisions wet with warm soap and water, but do not soak the incisions.  Pat area dry gently.  Immediately place antibiotic ointment. Take oral antibiotics as prescribed If skin around incision starts to get red (> 1cm), swollen, and/or more painful, please call the office ACTIVITY: Try to avoid sleeping on the side of your surgery, to the extent possible.   You may walk for exercise starting the day after surgery. For 2 weeks: Do not pick up anything greater than 5 pounds with the hand/arm that's on the same side as the surgery.  After 2 weeks, you may increase weight to 10 pounds.   Consider performing neck rolls 10 clockwise and 10 counterclockwise 3x/day. For 4 weeks, no strenuous activity (running, jogging, lifting weights, gardening, sports) or until cleared by physician.   PAIN MEDICATIONS: You will be prescribed Oxycodone for pain.   If pain is not severe, consider taking Tylenol 650mg  every 6  hours Avoid aspirin for 7 days after surgery Avoid direct heat (such as heating pads) to incision sites.   May gently place ice over surgery sites as needed.  Please place a thin clean towel over skin first and then place ice bag over towel.  Ice for 10 minutes at a time only.  POST-OPERATIVE CLINIC APPOINTMENTS: 1 week: suture removal and wound check in the office.  1 month: device activation and wound check in the office. 2.5 months: check in visit to assess usage. 3-4 months: titration sleep study based on usage of >4 hrs/night.  4 months: final wound check in the office.  Yearly: device check at office.  SCAR CARE: After incisions have healed, you will have a scar, which will continue to evolve over the course of 12 months.  Caring for your incision scars will help them to be as minimal as possible. If you are out in the sun with incision exposure, please remember to place sunscreen over the incision and surrounding skin.   You may use vitamin E or "Scar ointment/cream" to help soften scar.  Please wait one month after surgery before starting this.

## 2023-08-23 NOTE — Anesthesia Postprocedure Evaluation (Signed)
 Anesthesia Post Note  Patient: Bryan Becker  Procedure(s) Performed: DRUG INDUCED SLEEP ENDOSCOPY     Patient location during evaluation: PACU Anesthesia Type: MAC Level of consciousness: awake and alert Pain management: pain level controlled Vital Signs Assessment: post-procedure vital signs reviewed and stable Respiratory status: spontaneous breathing, nonlabored ventilation, respiratory function stable and patient connected to nasal cannula oxygen Cardiovascular status: stable and blood pressure returned to baseline Postop Assessment: no apparent nausea or vomiting Anesthetic complications: no  There were no known notable events for this encounter.  Last Vitals:  Vitals:   08/23/23 1100 08/23/23 1115  BP: (!) 149/72 (!) 150/82  Pulse: 81 77  Resp: (!) 21 16  Temp: 36.9 C 36.9 C  SpO2: 98% 93%    Last Pain:  Vitals:   08/23/23 1115  TempSrc:   PainSc: 0-No pain                 Trevor Iha

## 2023-08-24 ENCOUNTER — Encounter (HOSPITAL_COMMUNITY): Payer: Self-pay | Admitting: Otolaryngology
